# Patient Record
Sex: Male | Born: 1960 | Race: Black or African American | Hispanic: No | Marital: Single | Smoking: Former smoker
Health system: Southern US, Community
[De-identification: ages and names within clinical notes are randomized; demographics above are authoritative.]

## PROBLEM LIST (undated history)

## (undated) DIAGNOSIS — F1010 Alcohol abuse, uncomplicated: Secondary | ICD-10-CM

## (undated) DIAGNOSIS — I4891 Unspecified atrial fibrillation: Secondary | ICD-10-CM

## (undated) DIAGNOSIS — I10 Essential (primary) hypertension: Secondary | ICD-10-CM

## (undated) DIAGNOSIS — R51 Headache: Secondary | ICD-10-CM

## (undated) DIAGNOSIS — F419 Anxiety disorder, unspecified: Secondary | ICD-10-CM

## (undated) DIAGNOSIS — R519 Headache, unspecified: Secondary | ICD-10-CM

## (undated) DIAGNOSIS — F32A Depression, unspecified: Secondary | ICD-10-CM

## (undated) DIAGNOSIS — K219 Gastro-esophageal reflux disease without esophagitis: Secondary | ICD-10-CM

## (undated) DIAGNOSIS — M109 Gout, unspecified: Secondary | ICD-10-CM

## (undated) DIAGNOSIS — F329 Major depressive disorder, single episode, unspecified: Secondary | ICD-10-CM

## (undated) HISTORY — DX: Gout, unspecified: M10.9

## (undated) HISTORY — DX: Essential (primary) hypertension: I10

## (undated) HISTORY — PX: FOOT SURGERY: SHX648

## (undated) HISTORY — DX: Unspecified atrial fibrillation: I48.91

## (undated) HISTORY — PX: COLONOSCOPY: SHX174

## (undated) HISTORY — DX: Depression, unspecified: F32.A

## (undated) HISTORY — DX: Major depressive disorder, single episode, unspecified: F32.9

## (undated) HISTORY — DX: Anxiety disorder, unspecified: F41.9

---

## 2013-06-05 ENCOUNTER — Encounter: Payer: Self-pay | Admitting: *Deleted

## 2013-06-23 ENCOUNTER — Encounter: Payer: Self-pay | Admitting: Family Medicine

## 2013-06-23 ENCOUNTER — Ambulatory Visit (INDEPENDENT_AMBULATORY_CARE_PROVIDER_SITE_OTHER): Payer: BC Managed Care – PPO | Admitting: Family Medicine

## 2013-06-23 VITALS — BP 148/90 | HR 84 | Temp 97.7°F | Resp 16 | Ht 69.5 in | Wt 224.0 lb

## 2013-06-23 DIAGNOSIS — R259 Unspecified abnormal involuntary movements: Secondary | ICD-10-CM

## 2013-06-23 DIAGNOSIS — R251 Tremor, unspecified: Secondary | ICD-10-CM | POA: Insufficient documentation

## 2013-06-23 DIAGNOSIS — F17200 Nicotine dependence, unspecified, uncomplicated: Secondary | ICD-10-CM

## 2013-06-23 DIAGNOSIS — F411 Generalized anxiety disorder: Secondary | ICD-10-CM

## 2013-06-23 DIAGNOSIS — I10 Essential (primary) hypertension: Secondary | ICD-10-CM | POA: Insufficient documentation

## 2013-06-23 DIAGNOSIS — F1020 Alcohol dependence, uncomplicated: Secondary | ICD-10-CM

## 2013-06-23 DIAGNOSIS — K219 Gastro-esophageal reflux disease without esophagitis: Secondary | ICD-10-CM | POA: Insufficient documentation

## 2013-06-23 MED ORDER — OMEPRAZOLE 40 MG PO CPDR: 40.0000 mg | DELAYED_RELEASE_CAPSULE | Freq: Every day | ORAL | Status: DC

## 2013-06-23 MED ORDER — DIAZEPAM 5 MG PO TABS: 5.0000 mg | ORAL_TABLET | Freq: Every evening | ORAL | Status: DC | PRN

## 2013-06-23 MED ORDER — OLMESARTAN MEDOXOMIL-HCTZ 20-12.5 MG PO TABS: 1.0000 | ORAL_TABLET | Freq: Every day | ORAL | Status: DC

## 2013-06-23 MED ORDER — FLUOXETINE HCL 40 MG PO CAPS: 40.0000 mg | ORAL_CAPSULE | Freq: Every day | ORAL | Status: DC

## 2013-06-23 NOTE — Assessment & Plan Note (Signed)
Increase Prozac to 40 mg once a day. I will resume his Valium 5 mg at bedtime

## 2013-06-23 NOTE — Progress Notes (Signed)
Patient ID: Justin Fleming, male   DOB: April 06, 1960, 53 y.o.   MRN: [id number]   Subjective:    Patient ID: Justin FussDouglas L Fleming, male    DOB: April 06, 1960, 53 y.o.   MRN: [id number]  Patient presents for New Patient CPE and Anxiety  patient here to establish care. [facility]CP [facility]. He had not been to a physician for greater than 10 years before he establish care with them this year. He is history of hypertension as well as generalized anxiety and alcohol abuse. He states his anxiety is not very controlled and he does not sleep very well. I reviewed his medications he is a difficult historian. He is currently on fluoxetine 20 mg states it helps some but still does not take the edge off. He was given Ambien CR for insomnia which has not worked very well and looks like he was given and a prescription for Valium 5 mg to take at bedtime this worked much better but it was not refilled and therefore he continued to take the Ambien. He states that he drinks mostly gin every day. On the days he does not work he drinks more than usual but cannot quantify and amount for me today. He states that he's been anxious all his life and has always had a tremor or the shakes as long as he remembers. He does not know his family history very well. He states that he had a colonoscopy that is greater than 10 years ago he's never had a prostate exam that he is aware of . He also has a history of hypertension he was placed on amlodipine/benazepril however started having ankle swelling therefore he stopped this. He then was given a prescription for Lasix 20 mg which he's continued to take without any other antihypertensives.    Review Of Systems:  GEN- denies fatigue, fever, weight loss,weakness, recent illness HEENT- denies eye drainage, change in vision, nasal discharge, CVS- denies chest pain, palpitations RESP- denies SOB, cough, wheeze ABD- denies N/V, change in stools, abd pain GU- denies dysuria,  hematuria, dribbling, incontinence MSK- denies joint pain, muscle aches, injury Neuro- denies headache, dizziness, syncope, seizure activity       Objective:    BP 148/90  Pulse 84  Temp(Src) 97.7 F (36.5 C) (Oral)  Resp 16  Ht 5' 9.5" (1.765 m)  Wt 224 lb (101.606 kg)  BMI 32.62 kg/m2 GEN- NAD, alert and oriented x3 HEENT- PERRL, EOMI, non injected sclera, pink conjunctiva, MMM, oropharynx clear Neck- Supple,  CVS- RRR, no murmur RESP-CTAB EXT- No edema Pulses- Radial, DP- 2+ NEURO- CNII-XII in tact, fine tremor, no flap, Psych- anxious appearing, speech tremors some, not depressed, no SI       Assessment & Plan:      Problem List Items Addressed This Visit   Tremor   GERD (gastroesophageal reflux disease)   Relevant Medications      omeprazole (PRILOSEC) capsule   GAD (generalized anxiety disorder) - Primary   EtOH dependence   Essential hypertension, benign   Relevant Medications      BENICAR HCT 20-12.5 MG PO TABS      Note: This dictation was prepared with Dragon dictation along with smaller phrase technology. Any transcriptional errors that result from this process are unintentional.

## 2013-06-23 NOTE — Assessment & Plan Note (Signed)
We discussed the importance of cutting back on alcohol. I read his previous PCP notes he has declined alcohol counseling and rehabilitation in the past year

## 2013-06-23 NOTE — Patient Instructions (Addendum)
Increase prozac to 40mg , you can take 2 of the 20mg  tablets once a day Valium to be restarted in the evening  New blood pressure pill- benicar HCTZ, take once a day STOP THE LASIX, AMLOPIDINE  We will call with lab results  Try the prilosec for your stomach  F/U 2 weeks for medications

## 2013-06-23 NOTE — Assessment & Plan Note (Signed)
He does have history of acid reflux was taking Zantac but this has not helped as much. He requested something different we'll try him on Prilosec 40 mg

## 2013-06-23 NOTE — Assessment & Plan Note (Signed)
Wary of his tremor is related to both anxiety as well as his alcohol abuse. He has never been in full DTs because he does drink every day

## 2013-06-23 NOTE — Assessment & Plan Note (Signed)
Blood pressure is elevated I will give him samples of Benicar HCTZ 20/12.5 I will check his renal function today. We will discontinue the Lasix

## 2013-06-24 LAB — CBC WITH DIFFERENTIAL/PLATELET
BASOS ABS: 0.1 10*3/uL (ref 0.0–0.1)
BASOS PCT: 1 % (ref 0–1)
EOS PCT: 1 % (ref 0–5)
Eosinophils Absolute: 0.1 10*3/uL (ref 0.0–0.7)
HCT: 45.3 % (ref 39.0–52.0)
HEMOGLOBIN: 16.1 g/dL (ref 13.0–17.0)
LYMPHS PCT: 14 % (ref 12–46)
Lymphs Abs: 1.2 10*3/uL (ref 0.7–4.0)
MCH: 34.6 pg — AB (ref 26.0–34.0)
MCHC: 35.5 g/dL (ref 30.0–36.0)
MCV: 97.4 fL (ref 78.0–100.0)
MONO ABS: 1.1 10*3/uL — AB (ref 0.1–1.0)
MONOS PCT: 12 % (ref 3–12)
Neutro Abs: 6.3 10*3/uL (ref 1.7–7.7)
Neutrophils Relative %: 72 % (ref 43–77)
Platelets: 267 10*3/uL (ref 150–400)
RBC: 4.65 MIL/uL (ref 4.22–5.81)
RDW: 13 % (ref 11.5–15.5)
WBC: 8.8 10*3/uL (ref 4.0–10.5)

## 2013-06-24 LAB — COMPREHENSIVE METABOLIC PANEL
ALT: 25 U/L (ref 0–53)
AST: 30 U/L (ref 0–37)
Albumin: 4.7 g/dL (ref 3.5–5.2)
Alkaline Phosphatase: 75 U/L (ref 39–117)
BUN: 12 mg/dL (ref 6–23)
CHLORIDE: 99 meq/L (ref 96–112)
CO2: 27 mEq/L (ref 19–32)
CREATININE: 0.98 mg/dL (ref 0.50–1.35)
Calcium: 9.7 mg/dL (ref 8.4–10.5)
Glucose, Bld: 86 mg/dL (ref 70–99)
Potassium: 3.6 mEq/L (ref 3.5–5.3)
Sodium: 139 mEq/L (ref 135–145)
Total Bilirubin: 1.1 mg/dL (ref 0.2–1.2)
Total Protein: 8.1 g/dL (ref 6.0–8.3)

## 2013-07-11 ENCOUNTER — Ambulatory Visit (INDEPENDENT_AMBULATORY_CARE_PROVIDER_SITE_OTHER): Payer: BC Managed Care – PPO | Admitting: Family Medicine

## 2013-07-11 ENCOUNTER — Encounter: Payer: Self-pay | Admitting: Family Medicine

## 2013-07-11 VITALS — BP 126/82 | HR 72 | Temp 98.5°F | Resp 16 | Ht 70.5 in | Wt 218.0 lb

## 2013-07-11 DIAGNOSIS — G4700 Insomnia, unspecified: Secondary | ICD-10-CM

## 2013-07-11 DIAGNOSIS — Z202 Contact with and (suspected) exposure to infections with a predominantly sexual mode of transmission: Secondary | ICD-10-CM

## 2013-07-11 DIAGNOSIS — I10 Essential (primary) hypertension: Secondary | ICD-10-CM

## 2013-07-11 DIAGNOSIS — F411 Generalized anxiety disorder: Secondary | ICD-10-CM

## 2013-07-11 MED ORDER — OLMESARTAN MEDOXOMIL-HCTZ 20-12.5 MG PO TABS: 1.0000 | ORAL_TABLET | Freq: Every day | ORAL | Status: DC

## 2013-07-11 NOTE — Patient Instructions (Signed)
Continue current medications  Pick up the Valium and your blood pressure pills  F/U 3 months

## 2013-07-12 DIAGNOSIS — G4700 Insomnia, unspecified: Secondary | ICD-10-CM | POA: Insufficient documentation

## 2013-07-12 LAB — HSV(HERPES SMPLX)ABS-I+II(IGG+IGM)-BLD
HSV 2 Glycoprotein G Ab, IgG: <0.1 IV
Herpes Simplex Vrs I&II-IgM Ab (EIA): 0.78 INDEX

## 2013-07-12 NOTE — Progress Notes (Signed)
Patient ID: Justin FussDouglas L Rockefeller, male   DOB: [birth year], 53 y.o.   MRN: [id number]   Subjective:    Patient ID: Justin FussDouglas L Roat, male    DOB: [birth year], 53 y.o.   MRN: [id number]  Patient presents for 2 week F/U- HTN  patient here for interim followup. He states that the Benicar to well for him he did not have any side effects. His shaking has improved but he states that he did drink more last night therefore he states less when he drinks more than usual. Regarding his sleep he never picked up the diazepam there was some confusion at the pharmacy. He is also doing well on the Prilosec. He has no new concerns today. His labs were reviewed with him. 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 He is also doing much better with the Prozac at 40 mg, he feels like his nerves are better    Review Of Systems:  GEN- denies fatigue, fever, weight loss,weakness, recent illness HEENT- denies eye drainage, change in vision, nasal discharge, CVS- denies chest pain, palpitations RESP- denies SOB, cough, wheeze ABD- denies N/V, change in stools, abd pain GU- denies dysuria, hematuria, dribbling, incontinence MSK- denies joint pain, muscle aches, injury Neuro- denies headache, dizziness, syncope, seizure activity       Objective:    BP 126/82  Pulse 72  Temp(Src) 98.5 F (36.9 C) (Oral)  Resp 16  Ht 5' 10.5" (1.791 m)  Wt 218 lb (98.884 kg)  BMI 30.83 kg/m2 GEN- NAD, alert and oriented x3 HEENT- PERRL, EOMI, non injected sclera, pink conjunctiva, MMM, oropharynx clear CVS- RRR, no murmur RESP-CTAB Psych- normal affect and mood, not overly anxious, note no tremor noted today  Pulses- Radial 2+        Assessment & Plan:      Problem List Items Addressed This Visit   None    Visit Diagnoses   STD exposure    -  Primary    Relevant Orders       HSV(herpes smplx)abs-1+2(IgG+IgM)-bld (Completed)        Note: This dictation was prepared with Dragon dictation along with smaller phrase technology. Any transcriptional errors that result from this process are unintentional.

## 2013-07-12 NOTE — Assessment & Plan Note (Signed)
We will continue Benicar HCTZ 20/12.5, his blood pressure is improved some today, unsure when he decrease his alcohol and his blood pressure goes up is well-appearing

## 2013-07-12 NOTE — Assessment & Plan Note (Signed)
I will continue the Prozac at 40 mg he will use the Valium 5 mg for his sleep

## 2013-07-14 ENCOUNTER — Telehealth: Payer: Self-pay | Admitting: *Deleted

## 2013-07-14 NOTE — Telephone Encounter (Signed)
Received PA request for Benicar/ HCTZ.   PA submitted.

## 2013-07-17 NOTE — Telephone Encounter (Signed)
Received PA determination.   PA denied.  

## 2013-07-28 ENCOUNTER — Telehealth: Payer: Self-pay | Admitting: Family Medicine

## 2013-07-28 MED ORDER — LOSARTAN POTASSIUM 100 MG PO TABS: 100.0000 mg | ORAL_TABLET | Freq: Every day | ORAL | Status: DC

## 2013-07-28 MED ORDER — HYDROCHLOROTHIAZIDE 12.5 MG PO TABS: 12.5000 mg | ORAL_TABLET | Freq: Every day | ORAL | Status: DC

## 2013-07-28 NOTE — Telephone Encounter (Signed)
Patient would like to know if his insurance has approved his benicar please call him back at [phone]

## 2013-07-28 NOTE — Telephone Encounter (Signed)
Patient came by office and was made aware.

## 2013-07-28 NOTE — Telephone Encounter (Signed)
PA has been denied.   MD please advise.

## 2013-07-28 NOTE — Telephone Encounter (Signed)
Prescription sent to pharmacy.   Call placed to patient. LMTRC.  

## 2013-07-28 NOTE — Telephone Encounter (Signed)
Send Losartan 100mg  once a day,  HCTZ 12.5mg  once a day --  instead of Benciar HCT, insurance will cover these, tell him it is a similar medication

## 2013-11-09 ENCOUNTER — Telehealth: Payer: Self-pay | Admitting: Family Medicine

## 2013-11-09 NOTE — Telephone Encounter (Signed)
Patient requesting refill on diazepam Please call him at [phone]

## 2013-11-09 NOTE — Telephone Encounter (Signed)
Ok to refill??  Last office visit 07/11/2013.  Last refill 06/23/2013, #2 refills.

## 2013-11-10 MED ORDER — DIAZEPAM 5 MG PO TABS: 5.0000 mg | ORAL_TABLET | Freq: Every evening | ORAL | Status: DC | PRN

## 2013-11-10 NOTE — Telephone Encounter (Signed)
Medication called to pharmacy.  Call placed to patient. LMTRC.  

## 2013-11-10 NOTE — Telephone Encounter (Signed)
Give 1 refill, pt due for OV 

## 2013-11-13 ENCOUNTER — Other Ambulatory Visit: Payer: Self-pay | Admitting: Family Medicine

## 2013-11-13 NOTE — Telephone Encounter (Signed)
Patient returned call and made aware.   Appointment scheduled.   

## 2013-11-22 ENCOUNTER — Ambulatory Visit: Payer: Self-pay | Admitting: Family Medicine

## 2013-11-24 ENCOUNTER — Ambulatory Visit (INDEPENDENT_AMBULATORY_CARE_PROVIDER_SITE_OTHER): Payer: BC Managed Care – PPO | Admitting: Family Medicine

## 2013-11-24 ENCOUNTER — Encounter: Payer: Self-pay | Admitting: Family Medicine

## 2013-11-24 VITALS — BP 134/76 | HR 68 | Temp 98.7°F | Resp 14 | Ht 71.0 in | Wt 217.0 lb

## 2013-11-24 DIAGNOSIS — Z23 Encounter for immunization: Secondary | ICD-10-CM

## 2013-11-24 DIAGNOSIS — G4700 Insomnia, unspecified: Secondary | ICD-10-CM

## 2013-11-24 DIAGNOSIS — F411 Generalized anxiety disorder: Secondary | ICD-10-CM

## 2013-11-24 DIAGNOSIS — I10 Essential (primary) hypertension: Secondary | ICD-10-CM

## 2013-11-24 DIAGNOSIS — F1020 Alcohol dependence, uncomplicated: Secondary | ICD-10-CM

## 2013-11-24 MED ORDER — OMEPRAZOLE 40 MG PO CPDR: 40.0000 mg | DELAYED_RELEASE_CAPSULE | Freq: Every day | ORAL | Status: DC

## 2013-11-24 MED ORDER — FLUOXETINE HCL 40 MG PO CAPS: 40.0000 mg | ORAL_CAPSULE | Freq: Every day | ORAL | Status: DC

## 2013-11-24 MED ORDER — OLMESARTAN MEDOXOMIL-HCTZ 20-12.5 MG PO TABS: 1.0000 | ORAL_TABLET | Freq: Every day | ORAL | Status: DC

## 2013-11-24 MED ORDER — DIAZEPAM 5 MG PO TABS: 5.0000 mg | ORAL_TABLET | Freq: Every evening | ORAL | Status: DC | PRN

## 2013-11-24 NOTE — Progress Notes (Signed)
Patient ID: Justin Fleming, male   DOB: [birth year], 53 y.o.   MRN: [id number]   Subjective:    Patient ID: Justin Fleming, male    DOB: [birth year], 53 y.o.   MRN: [id number]  Patient presents for F/U patient here to follow-up chronic medical problems. He has no specific concerns today. He trying to decrease his alcohol intake drinks gin . He has not been taking his Prozac on a daily basis and he asked was on 40 mg and was doing better with this but his bottle today which is from September is 20 mg.He is taking valium once a day which helps his tremor    Review Of Systems:  GEN- denies fatigue, fever, weight loss,weakness, recent illness HEENT- denies eye drainage, change in vision, nasal discharge, CVS- denies chest pain, palpitations RESP- denies SOB, cough, wheeze ABD- denies N/V, change in stools, abd pain GU- denies dysuria, hematuria, dribbling, incontinence MSK- denies joint pain, muscle aches, injury Neuro- denies headache, dizziness, syncope, seizure activity       Objective:    BP 134/76 mmHg  Pulse 68  Temp(Src) 98.7 F (37.1 C) (Oral)  Resp 14  Ht 5\' 11"  (1.803 m)  Wt 217 lb (98.431 kg)  BMI 30.28 kg/m2 GEN- NAD, alert and oriented x3 HEENT- PERRL, EOMI, non injected sclera, pink conjunctiva, MMM, oropharynx clear Neck- Supple,  CVS- RRR, no murmur RESP-CTAB ABD-NABS,soft, liver edge not palpated, NT EXT- No edema Psych- flat affect, not anxious, no SI, well groomed Pulses- Radial 2+        Assessment & Plan:      Problem List Items Addressed This Visit    None    Visit Diagnoses    Need for prophylactic vaccination with combined diphtheria-tetanus-pertussis (DTP) vaccine    -  Primary    Relevant Orders       Tdap vaccine greater than or equal to 7yo IM (Completed)       Note: This dictation was prepared with Dragon dictation along with smaller phrase technology. Any transcriptional errors that result from this process are unintentional.

## 2013-11-24 NOTE — Patient Instructions (Signed)
F/U 4 months for PHYSICAL Tetanus Shot given

## 2013-11-24 NOTE — Assessment & Plan Note (Signed)
Continue to work on cessation

## 2013-11-24 NOTE — Assessment & Plan Note (Signed)
prozac increased back to 40mg , contiue valium Declined flu shot, TDAP given

## 2013-11-24 NOTE — Assessment & Plan Note (Signed)
Well controlled 

## 2013-12-21 ENCOUNTER — Telehealth: Payer: Self-pay | Admitting: Family Medicine

## 2013-12-21 NOTE — Telephone Encounter (Signed)
Patient went to his pharmacy to get refills on ALL medications and they told him to call us for this  Please call him with any questions    [phone] [facility] [address] [facility]

## 2013-12-21 NOTE — Telephone Encounter (Signed)
Pt has refills already placed, does not need us to refill at this time

## 2013-12-26 ENCOUNTER — Telehealth: Payer: Self-pay | Admitting: Family Medicine

## 2013-12-26 NOTE — Telephone Encounter (Signed)
Call placed to [facility].   Was advised that refills were called in on 11/24/2013 and are available.   Advised that Benicar was just filled on 12/09/2013, so it is too early to refill at this time.   Call placed to patient and patient made aware.

## 2013-12-26 NOTE — Telephone Encounter (Signed)
Patient is calling to say that he called his pharmacy and they told him to call us, to get refills on his prozac, benicar, and diazepam if possible  [phone] if any questions

## 2014-01-31 ENCOUNTER — Other Ambulatory Visit: Payer: Self-pay | Admitting: Family Medicine

## 2014-01-31 NOTE — Telephone Encounter (Signed)
He is on Valium 5mg , not 10, okay to refill 5mg 

## 2014-01-31 NOTE — Telephone Encounter (Signed)
Patient seen in office on 11/24/2013. Refill for Diazepam 5mg  called in with #2 refills.   No order for Diazepam 10mg  noted.   MD please advise.

## 2014-02-01 MED ORDER — DIAZEPAM 5 MG PO TABS: 5.0000 mg | ORAL_TABLET | Freq: Every evening | ORAL | Status: DC | PRN

## 2014-02-01 NOTE — Telephone Encounter (Signed)
Finally able to reach pharmacy and left refill message

## 2014-02-01 NOTE — Telephone Encounter (Signed)
10 mg refill request for Diazepam denied.  Trying to call in 5 mg dose.  Line busy

## 2014-03-28 ENCOUNTER — Other Ambulatory Visit: Payer: BLUE CROSS/BLUE SHIELD

## 2014-03-28 DIAGNOSIS — Z79899 Other long term (current) drug therapy: Secondary | ICD-10-CM

## 2014-03-28 DIAGNOSIS — K219 Gastro-esophageal reflux disease without esophagitis: Secondary | ICD-10-CM

## 2014-03-28 DIAGNOSIS — Z720 Tobacco use: Secondary | ICD-10-CM

## 2014-03-28 DIAGNOSIS — F411 Generalized anxiety disorder: Secondary | ICD-10-CM

## 2014-03-28 DIAGNOSIS — Z0000 Encounter for general adult medical examination without abnormal findings: Secondary | ICD-10-CM

## 2014-03-28 DIAGNOSIS — I10 Essential (primary) hypertension: Secondary | ICD-10-CM

## 2014-03-28 DIAGNOSIS — Z125 Encounter for screening for malignant neoplasm of prostate: Secondary | ICD-10-CM

## 2014-03-28 DIAGNOSIS — R251 Tremor, unspecified: Secondary | ICD-10-CM

## 2014-03-28 DIAGNOSIS — F1029 Alcohol dependence with unspecified alcohol-induced disorder: Secondary | ICD-10-CM

## 2014-03-28 LAB — COMPLETE METABOLIC PANEL WITH GFR
ALK PHOS: 86 U/L (ref 39–117)
ALT: 24 U/L (ref 0–53)
AST: 40 U/L — AB (ref 0–37)
Albumin: 4 g/dL (ref 3.5–5.2)
BILIRUBIN TOTAL: 2.4 mg/dL — AB (ref 0.2–1.2)
BUN: 16 mg/dL (ref 6–23)
CO2: 24 meq/L (ref 19–32)
CREATININE: 1.17 mg/dL (ref 0.50–1.35)
Calcium: 9.2 mg/dL (ref 8.4–10.5)
Chloride: 98 mEq/L (ref 96–112)
GFR, EST AFRICAN AMERICAN: 80 mL/min
GFR, Est Non African American: 69 mL/min
Glucose, Bld: 100 mg/dL — ABNORMAL HIGH (ref 70–99)
POTASSIUM: 3.8 meq/L (ref 3.5–5.3)
Sodium: 136 mEq/L (ref 135–145)
Total Protein: 7.4 g/dL (ref 6.0–8.3)

## 2014-03-28 LAB — CBC WITH DIFFERENTIAL/PLATELET
BASOS ABS: 0.1 10*3/uL (ref 0.0–0.1)
Basophils Relative: 1 % (ref 0–1)
Eosinophils Absolute: 0.1 10*3/uL (ref 0.0–0.7)
Eosinophils Relative: 2 % (ref 0–5)
HCT: 46.3 % (ref 39.0–52.0)
Hemoglobin: 15.9 g/dL (ref 13.0–17.0)
LYMPHS ABS: 1 10*3/uL (ref 0.7–4.0)
LYMPHS PCT: 19 % (ref 12–46)
MCH: 37.6 pg — ABNORMAL HIGH (ref 26.0–34.0)
MCHC: 34.3 g/dL (ref 30.0–36.0)
MCV: 109.5 fL — ABNORMAL HIGH (ref 78.0–100.0)
MPV: 10.3 fL (ref 8.6–12.4)
Monocytes Absolute: 0.7 10*3/uL (ref 0.1–1.0)
Monocytes Relative: 14 % — ABNORMAL HIGH (ref 3–12)
NEUTROS PCT: 64 % (ref 43–77)
Neutro Abs: 3.3 10*3/uL (ref 1.7–7.7)
PLATELETS: 185 10*3/uL (ref 150–400)
RBC: 4.23 MIL/uL (ref 4.22–5.81)
RDW: 15.1 % (ref 11.5–15.5)
WBC: 5.1 10*3/uL (ref 4.0–10.5)

## 2014-03-28 LAB — LIPID PANEL
Cholesterol: 177 mg/dL (ref 0–200)
HDL: 79 mg/dL (ref >=40)
LDL Cholesterol: 82 mg/dL (ref 0–99)
Total CHOL/HDL Ratio: 2.2 Ratio
Triglycerides: 78 mg/dL (ref <150)
VLDL: 16 mg/dL (ref 0–40)

## 2014-03-28 LAB — TSH: TSH: 1.364 u[IU]/mL (ref 0.350–4.500)

## 2014-03-29 LAB — PSA: PSA: 0.98 ng/mL (ref <=4.00)

## 2014-04-02 ENCOUNTER — Ambulatory Visit (INDEPENDENT_AMBULATORY_CARE_PROVIDER_SITE_OTHER): Payer: BLUE CROSS/BLUE SHIELD | Admitting: Family Medicine

## 2014-04-02 ENCOUNTER — Encounter: Payer: Self-pay | Admitting: Family Medicine

## 2014-04-02 VITALS — BP 148/86 | HR 76 | Temp 98.8°F | Resp 14 | Ht 71.0 in | Wt 204.0 lb

## 2014-04-02 DIAGNOSIS — F411 Generalized anxiety disorder: Secondary | ICD-10-CM

## 2014-04-02 DIAGNOSIS — W57XXXA Bitten or stung by nonvenomous insect and other nonvenomous arthropods, initial encounter: Secondary | ICD-10-CM

## 2014-04-02 DIAGNOSIS — Z0000 Encounter for general adult medical examination without abnormal findings: Secondary | ICD-10-CM | POA: Diagnosis not present

## 2014-04-02 DIAGNOSIS — I10 Essential (primary) hypertension: Secondary | ICD-10-CM

## 2014-04-02 DIAGNOSIS — Z1211 Encounter for screening for malignant neoplasm of colon: Secondary | ICD-10-CM | POA: Diagnosis not present

## 2014-04-02 DIAGNOSIS — F1020 Alcohol dependence, uncomplicated: Secondary | ICD-10-CM | POA: Diagnosis not present

## 2014-04-02 DIAGNOSIS — G4700 Insomnia, unspecified: Secondary | ICD-10-CM

## 2014-04-02 DIAGNOSIS — T148 Other injury of unspecified body region: Secondary | ICD-10-CM | POA: Diagnosis not present

## 2014-04-02 MED ORDER — DIAZEPAM 5 MG PO TABS: 5.0000 mg | ORAL_TABLET | Freq: Two times a day (BID) | ORAL | Status: DC | PRN

## 2014-04-02 NOTE — Assessment & Plan Note (Addendum)
single AST mildly elevated, also has increased MCV, discussed cutting back to taper off his ETOH I am concerned he will go  Into DT if not monitored and he does not want to go into Detox program He is self treating his anxiety with ETOH, his tremor worsens as the day goes on as ETOH wears off as wel Will try him on 2.5mg  Valium in AM and continue 5mg  in PM Start thiamine and Folic acid Discussed he can go to ED for detox at any time

## 2014-04-02 NOTE — Assessment & Plan Note (Signed)
Declines use of SSRI, see above for valium, which may help with is tremor

## 2014-04-02 NOTE — Assessment & Plan Note (Signed)
BP elevated today, but also anxious, will have him check at home and see what readings are before changing meds

## 2014-04-02 NOTE — Patient Instructions (Addendum)
Referral for Colon cancer screening Take the valium take 1/2 tablet in the morning and 1 tablet in the evening Continue blood pressure medication Folic acid 1mg  once a day  Thiamine  50mg  once a day  Check your blood pressure at home, we will call in 1 week with readings Labs overall look okay  Repeat liver labs in 12 weeks F/U  3 months

## 2014-04-02 NOTE — Progress Notes (Signed)
Patient ID: Justin Fleming, male   DOB: [birth year], 54 y.o.   MRN: [id number]   Subjective:    Patient ID: Justin Fleming, male    DOB: June 19, 1960, 54 y.o.   MRN: [id number]  Patient presents for CPE  patient here for complete physical exam. His medications were reviewed. He has not been taking his Prozac because he states that it was not helping it was worsening his anxiety. He has been taking the Valium which is the only thing that calms him down and does allow him to sleep about 5 or 6 hours. He continues to self medicate with alcohol he is now drinking whiskey he states that he drinks at least a half a pint every night he is fine during the day but when it starts to wear off his tremor starts to get worse at the end of his work shift. He denies any hallucinations but states that this does make his anxiety increased when his tremor gets worse.  He pulled 3 ticks off of him 3 weeks ago he did not have any drainage from the lesions but he continues to pick where they were and they have turned into scabs.  Hypertension he states he is taking his blood pressure is prescribed he's only missed a couple of doses of the medication. No side effects from the medication.  Fasting labs were reviewed his PSA was normal he is due for colon cancer screening.    Review Of Systems:  GEN- denies fatigue, fever, weight loss,weakness, recent illness HEENT- denies eye drainage, change in vision, nasal discharge, CVS- denies chest pain, palpitations RESP- denies SOB, cough, wheeze ABD- denies N/V, change in stools, abd pain GU- denies dysuria, hematuria, dribbling, incontinence MSK- denies joint pain, muscle aches, injury Neuro- denies headache, dizziness, syncope, seizure activity       Objective:    BP 148/86 mmHg  Pulse 76  Temp(Src) 98.8 F (37.1 C) (Oral)  Resp 14  Ht  (1.803 m)  Wt 204 lb (92.534 kg)  BMI 28.46 kg/m2 GEN- NAD, alert and oriented x3 HEENT- PERRL, EOMI, non injected  sclera, pink conjunctiva, MMM, oropharynx clear Neck- Supple, no thyromegaly CVS- RRR, no murmur RESP-CTAB ABD-NABS,soft,NT,ND Psych- anxious appearing, not depressed, no SI, well groomed Neuro- CNII-XII in tact, resting tremor, stumbling speech at times Rectum- normal tone, external skin tag, FOBT neg, prostate smooth, no nodules Skin- right buttocks- scab, no erythema, hyperpigmentation noted, right groin- small open scab, hyperpigmented circular, no induration, NT, small scab left lower abdomen EXT- No edema Pulses- Radial, DP- 2+        Assessment & Plan:      Problem List Items Addressed This Visit      Unprioritized   Insomnia   GAD (generalized anxiety disorder)    Declines use of SSRI, see above for valium, which may help with is tremor      EtOH dependence - Primary    single AST mildly elevated, also has increased MCV, discussed cutting back to taper off his ETOH I am concerned he will go  Into DT if not monitored and he does not want to go into Detox program He is self treating his anxiety with ETOH, his tremor worsens as the day goes on as ETOH wears off as wel Will try him on 2.5mg  Valium in AM and continue  in PM Start thiamine and Folic acid      Essential hypertension, benign    BP elevated today, but also  anxious, will have him check at home and see what readings are before changing meds       Other Visit Diagnoses    Routine general medical examination at a health care facility        CPE done, immunizations UTD, fasting labs reviewed, PSA normal, refer for colonoscopy.     Colon cancer screening        Relevant Orders    Ambulatory referral to Gastroenterology    Tick bite        Few tick bites, scabs noted with pigmented scarring, use topical antibiotic, advised to stop picking at lesions       Note: This dictation was prepared with Dragon dictation along with smaller phrase technology. Any transcriptional errors that result from this process  are unintentional.

## 2014-04-30 ENCOUNTER — Telehealth: Payer: Self-pay

## 2014-04-30 NOTE — Telephone Encounter (Signed)
Pt called this afternoon for DS. He had received a letter to call. I told him she was not here today and hopefully will be back tomorrow. Please call [phone]

## 2014-05-01 NOTE — Telephone Encounter (Signed)
I called pt. He said he had a colonoscopy about 25 years ago by someone in [place]. He said [place] of [place]. He denies it being done at the hospital, but he does not know the physician's name. He said he did not have any polyps and no problems.   He has some concerns with his stomach, a lot of gas and a lot of noises. He has had chest pain previously and was told it is not cardiac.York SpanielSaid it was reflux.  He started on Omeprazole 40 mg daily but he just takes as he feels that he needs it now.   OV with Gerrit HallsAnna Sams, NP on 05/23/2014 at 1:30 PM.

## 2014-05-02 NOTE — Telephone Encounter (Signed)
LMOM for pt to call with his insurance information.

## 2014-05-04 NOTE — Telephone Encounter (Signed)
Pt has [employer] Subscriber ID  [id number].

## 2014-05-07 ENCOUNTER — Other Ambulatory Visit: Payer: Self-pay | Admitting: Family Medicine

## 2014-05-08 NOTE — Telephone Encounter (Signed)
Refill denied.   Patient reported at CPE that he is not taking Prozac.   Pharmacy made aware that if patient is requesting refill, he requires OV to discuss use.

## 2014-05-23 ENCOUNTER — Telehealth: Payer: Self-pay | Admitting: General Practice

## 2014-05-23 ENCOUNTER — Encounter: Payer: Self-pay | Admitting: Gastroenterology

## 2014-05-23 ENCOUNTER — Ambulatory Visit (INDEPENDENT_AMBULATORY_CARE_PROVIDER_SITE_OTHER): Payer: BLUE CROSS/BLUE SHIELD | Admitting: Gastroenterology

## 2014-05-23 ENCOUNTER — Other Ambulatory Visit: Payer: Self-pay

## 2014-05-23 VITALS — BP 139/95 | HR 109 | Temp 98.0°F | Ht 71.0 in | Wt 205.4 lb

## 2014-05-23 DIAGNOSIS — R160 Hepatomegaly, not elsewhere classified: Secondary | ICD-10-CM | POA: Insufficient documentation

## 2014-05-23 DIAGNOSIS — Z1211 Encounter for screening for malignant neoplasm of colon: Secondary | ICD-10-CM | POA: Diagnosis not present

## 2014-05-23 MED ORDER — PEG 3350-KCL-NA BICARB-NACL 420 G PO SOLR: 4000.0000 mL | Freq: Once | ORAL | Status: DC

## 2014-05-23 NOTE — Telephone Encounter (Signed)
OPENED IN ERROR
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
## 2014-05-23 NOTE — Progress Notes (Signed)
cc'ed to pcp °

## 2014-05-23 NOTE — Assessment & Plan Note (Signed)
Mild hepatomegaly on exam, liver margin smooth. Slight elevation in AST consistent with ETOH use. Tbili 2.4, non-specific. Proceed with US abdomen. Discussed ETOH cessation.

## 2014-05-23 NOTE — Patient Instructions (Addendum)
I have ordered an ultrasound to further evaluate your liver.   You have been scheduled for a routine screening colonoscopy with Dr. Darrick PennaFields.   I have also attached a gas/bloating handout.   Bloating Bloating is the feeling of fullness in your belly. You may feel as though your pants are too tight. Often the cause of bloating is overeating, retaining fluids, or having gas in your bowel. It is also caused by swallowing air and eating foods that cause gas. Irritable bowel syndrome is one of the most common causes of bloating. Constipation is also a common cause. Sometimes more serious problems can cause bloating. SYMPTOMS  Usually there is a feeling of fullness, as though your abdomen is bulged out. There may be mild discomfort.  DIAGNOSIS  Usually no particular testing is necessary for most bloating. If the condition persists and seems to become worse, your caregiver may do additional testing.  TREATMENT   There is no direct treatment for bloating.  Do not put gas into the bowel. Avoid chewing gum and sucking on candy. These tend to make you swallow air. Swallowing air can also be a nervous habit. Try to avoid this.  Avoiding high residue diets will help. Eat foods with soluble fibers (examples include root vegetables, apples, or barley) and substitute dairy products with soy and rice products. This helps irritable bowel syndrome.  If constipation is the cause, then a high residue diet with more fiber will help.  Avoid carbonated beverages.  Over-the-counter preparations are available that help reduce gas. Your pharmacist can help you with this. SEEK MEDICAL CARE IF:   Bloating continues and seems to be getting worse.  You notice a weight gain.  You have a weight loss but the bloating is getting worse.  You have changes in your bowel habits or develop nausea or vomiting. SEEK IMMEDIATE MEDICAL CARE IF:   You develop shortness of breath or swelling in your legs.  You have an  increase in abdominal pain or develop chest pain. Document Released: 10/22/2005 Document Revised: 03/16/2011 Document Reviewed: 12/10/2006 [facility] Patient Information 2015 [place], [place]. This information is not intended to replace advice given to you by your health care provider. Make sure you discuss any questions you have with your health care provider.

## 2014-05-23 NOTE — Assessment & Plan Note (Signed)
54 year old male due for screening colonoscopy without any concerning lower GI symptoms. Last colonoscopy in remote past, unknown provider.   Proceed with TCS with Dr. Darrick PennaFields in near future: the risks, benefits, and alternatives have been discussed with the patient in detail. The patient states understanding and desires to proceed. Phenergan 25 mg IV on call due to ETOH use

## 2014-05-28 ENCOUNTER — Ambulatory Visit (HOSPITAL_COMMUNITY)
Admission: RE | Admit: 2014-05-28 | Discharge: 2014-05-28 | Disposition: A | Discharge status: Home / Self Care | Payer: BLUE CROSS/BLUE SHIELD | Source: Ambulatory Visit | Attending: Gastroenterology | Admitting: Gastroenterology

## 2014-05-28 DIAGNOSIS — R160 Hepatomegaly, not elsewhere classified: Secondary | ICD-10-CM | POA: Diagnosis present

## 2014-05-29 NOTE — Progress Notes (Signed)
Quick Note:  Fatty liver noted. Mildly enlarged. If willing, an elastography would help shed light on any fibrosis/cirrhosis. Let's put a possible EGD on at time of colonoscopy IN CASE of high Metavir score. Please let patient know this and see if he is willing. ______

## 2014-05-30 NOTE — Progress Notes (Signed)
Quick Note:  I called and informed pt. He said he needs a little time to think about it and he will let us know. I urged him to call within the next day or so to get the EGD added if he decides to do it and he said he would. ______

## 2014-06-06 NOTE — Progress Notes (Signed)
Quick Note:  Appt is for June 13th. Has he decided? ______

## 2014-06-06 NOTE — Progress Notes (Signed)
Quick Note:  Pt is scheduled for the colonoscopy on 06/15/2014. He said he is still not sure if he wants to have the EGD. He said [place] is left with [place].00 from the [place]. He is gong to call his insurance co and ask them. I gave him the procedure codes for the TCS ( 16109( 45378) and EGD ( 60454( 43235).  He said he will try to call back this afternoon or tomorrow and let us know. ______

## 2014-06-08 ENCOUNTER — Telehealth: Payer: Self-pay

## 2014-06-08 NOTE — Telephone Encounter (Signed)
Pt had left a VM that he had some questions about his procedures. I returned his call and LMOM for a return call.

## 2014-06-08 NOTE — Telephone Encounter (Signed)
Noted  

## 2014-06-08 NOTE — Progress Notes (Signed)
Quick Note:  See separate phone note also. Pt said his insurance will not cover the EGD so therefore he only wants the colonoscopy for now. ______

## 2014-06-08 NOTE — Telephone Encounter (Signed)
PT called and said he decided not to have the EGD done now, just proceed with the colonoscopy as scheduled.  He said he checked with his insurance and they will cover the colonoscopy but not the EGD.  He is only scheduled for the TCS.

## 2014-06-15 ENCOUNTER — Encounter (HOSPITAL_COMMUNITY)
Admission: RE | Disposition: A | Discharge status: Home / Self Care | Payer: Self-pay | Source: Ambulatory Visit | Attending: Gastroenterology

## 2014-06-15 ENCOUNTER — Encounter (HOSPITAL_COMMUNITY): Payer: Self-pay | Admitting: *Deleted

## 2014-06-15 ENCOUNTER — Ambulatory Visit (HOSPITAL_COMMUNITY)
Admission: RE | Admit: 2014-06-15 | Discharge: 2014-06-15 | Disposition: A | Discharge status: Home / Self Care | Payer: BLUE CROSS/BLUE SHIELD | Source: Ambulatory Visit | Attending: Gastroenterology | Admitting: Gastroenterology

## 2014-06-15 DIAGNOSIS — I10 Essential (primary) hypertension: Secondary | ICD-10-CM | POA: Insufficient documentation

## 2014-06-15 DIAGNOSIS — Z79899 Other long term (current) drug therapy: Secondary | ICD-10-CM | POA: Insufficient documentation

## 2014-06-15 DIAGNOSIS — K5730 Diverticulosis of large intestine without perforation or abscess without bleeding: Secondary | ICD-10-CM | POA: Insufficient documentation

## 2014-06-15 DIAGNOSIS — K621 Rectal polyp: Secondary | ICD-10-CM | POA: Insufficient documentation

## 2014-06-15 DIAGNOSIS — Z7982 Long term (current) use of aspirin: Secondary | ICD-10-CM | POA: Insufficient documentation

## 2014-06-15 DIAGNOSIS — Z87891 Personal history of nicotine dependence: Secondary | ICD-10-CM | POA: Diagnosis not present

## 2014-06-15 DIAGNOSIS — M6289 Other specified disorders of muscle: Secondary | ICD-10-CM | POA: Insufficient documentation

## 2014-06-15 DIAGNOSIS — F419 Anxiety disorder, unspecified: Secondary | ICD-10-CM | POA: Diagnosis not present

## 2014-06-15 DIAGNOSIS — K219 Gastro-esophageal reflux disease without esophagitis: Secondary | ICD-10-CM | POA: Diagnosis not present

## 2014-06-15 DIAGNOSIS — K648 Other hemorrhoids: Secondary | ICD-10-CM | POA: Insufficient documentation

## 2014-06-15 DIAGNOSIS — K651 Peritoneal abscess: Secondary | ICD-10-CM | POA: Diagnosis not present

## 2014-06-15 DIAGNOSIS — Z1211 Encounter for screening for malignant neoplasm of colon: Secondary | ICD-10-CM | POA: Diagnosis not present

## 2014-06-15 HISTORY — PX: COLONOSCOPY: SHX5424

## 2014-06-15 HISTORY — DX: Gastro-esophageal reflux disease without esophagitis: K21.9

## 2014-06-15 HISTORY — DX: Headache, unspecified: R51.9

## 2014-06-15 HISTORY — DX: Headache: R51

## 2014-06-15 SURGERY — COLONOSCOPY
Anesthesia: Moderate Sedation

## 2014-06-15 MED ORDER — STERILE WATER FOR IRRIGATION IR SOLN
Status: DC | PRN
  Administered 2014-06-15: 15:00:00

## 2014-06-15 MED ORDER — MEPERIDINE HCL 100 MG/ML IJ SOLN
INTRAMUSCULAR | Status: DC | PRN
  Administered 2014-06-15 (×2): 25 mg
  Administered 2014-06-15: 50 mg
  Administered 2014-06-15 (×2): 25 mg

## 2014-06-15 MED ORDER — SODIUM CHLORIDE 0.9 % IJ SOLN
INTRAMUSCULAR | Status: AC
  Filled 2014-06-15: qty 3

## 2014-06-15 MED ORDER — SODIUM CHLORIDE 0.9 % IV SOLN
INTRAVENOUS | Status: DC
  Administered 2014-06-15: 1000 mL via INTRAVENOUS

## 2014-06-15 MED ORDER — MIDAZOLAM HCL 5 MG/5ML IJ SOLN
INTRAMUSCULAR | Status: DC | PRN
  Administered 2014-06-15 (×4): 2 mg via INTRAVENOUS

## 2014-06-15 MED ORDER — PROMETHAZINE HCL 25 MG/ML IJ SOLN
25.0000 mg | Freq: Once | INTRAMUSCULAR | Status: AC
Start: 2014-06-15 — End: 2014-06-15
  Administered 2014-06-15: 25 mg via INTRAVENOUS
  Filled 2014-06-15: qty 1

## 2014-06-15 MED ORDER — MEPERIDINE HCL 100 MG/ML IJ SOLN
INTRAMUSCULAR | Status: DC
Start: 2014-06-15 — End: 2014-06-15
  Filled 2014-06-15: qty 2

## 2014-06-15 MED ORDER — MIDAZOLAM HCL 5 MG/5ML IJ SOLN
INTRAMUSCULAR | Status: AC
  Filled 2014-06-15: qty 10
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
## 2014-06-15 NOTE — Op Note (Signed)
[facility] [address] [place], [address]   COLONOSCOPY PROCEDURE REPORT  PATIENT: Justin Fleming, Justin Fleming  MR#: [id number] BIRTHDATE: [birth year] , 54  yrs. old GENDER: male ENDOSCOPIST: West Bali, MD REFERRED OI:NOMVEHM Chase, M.D. PROCEDURE DATE:  07-09-2014 PROCEDURE:   Colonoscopy with cold biopsy polypectomy INDICATIONS:average risk patient for colon cancer. MEDICATIONS: Promethazine (Phenergan) 25 mg IV, Demerol 150 mg IV, and Versed 8 mg IV  DESCRIPTION OF PROCEDURE:    Physical exam was performed.  Informed consent was obtained from the patient after explaining the benefits, risks, and alternatives to procedure.  The patient was connected to monitor and placed in left lateral position. Continuous oxygen was provided by nasal cannula and IV medicine administered through an indwelling cannula.  After administration of sedation and rectal exam, the patients rectum was intubated and the EC-3890Li ([id number])  colonoscope was advanced under direct visualization to the cecum.  The scope was removed slowly by carefully examining the color, texture, anatomy, and integrity mucosa on the way out.  The patient was recovered in endoscopy and discharged home in satisfactory condition. Estimated blood loss is zero unless otherwise noted in this procedure report.    COLON FINDINGS: There was mild diverticulosis noted in the sigmoid colon with associated muscular hypertrophy.  , A sessile polyp measuring 4 mm in size was found in the rectum.  A polypectomy was performed with cold forceps.  , and Small internal hemorrhoids were found.  PREP QUALITY: good.  CECAL W/D TIME: 15       minutes COMPLICATIONS: None  ENDOSCOPIC IMPRESSION: 1.   Mild diverticulosis in the sigmoid colon 2.   ONE RECTAL polyp REMOVED 3.   Small internal hemorrhoids  RECOMMENDATIONS: FOLLOW A HIGH FIBER DIET. AWAIT BIOPSY RESULTS. Next colonoscopy in 5-10  years.    _______________________________ eSignedWest Bali, MD 07/09/14 4:27 PM    CPT CODES: ICD CODES:  The ICD and CPT codes recommended by this software are interpretations from the data that the clinical staff has captured with the software.  The verification of the translation of this report to the ICD and CPT codes and modifiers is the sole responsibility of the health care institution and practicing physician where this report was generated.  [employer] will not be held responsible for the validity of the ICD and CPT codes included on this report.  AMA assumes no liability for data contained or not contained herein. CPT is a Publishing rights manager of the [employer].

## 2014-06-15 NOTE — Progress Notes (Signed)
REVIEWED-NO ADDITIONAL RECOMMENDATIONS. 

## 2014-06-15 NOTE — Discharge Instructions (Signed)
You had 1 small polyp removed. You have small internal hemorrhoids and diverticulosis IN YOUR LEFT COLON.   FOLLOW A HIGH FIBER DIET. AVOID ITEMS THAT CAUSE BLOATING. SEE INFO BELOW.  YOUR BIOPSY RESULTS WILL BE AVAILABLE IN MY CHART AFTER JUN 15 AND MY OFFICE WILL CONTACT YOU IN 10-14 DAYS WITH YOUR RESULTS.   Next colonoscopy in 5-10 years.   Colonoscopy Care After Read the instructions outlined below and refer to this sheet in the next week. These discharge instructions provide you with general information on caring for yourself after you leave the hospital. While your treatment has been planned according to the most current medical practices available, unavoidable complications occasionally occur. If you have any problems or questions after discharge, call DR. Glendon Fiser, [phone].  ACTIVITY  You may resume your regular activity, but move at a slower pace for the next 24 hours.   Take frequent rest periods for the next 24 hours.   Walking will help get rid of the air and reduce the bloated feeling in your belly (abdomen).   No driving for 24 hours (because of the medicine (anesthesia) used during the test).   You may shower.   Do not sign any important legal documents or operate any machinery for 24 hours (because of the anesthesia used during the test).    NUTRITION  Drink plenty of fluids.   You may resume your normal diet as instructed by your doctor.   Begin with a light meal and progress to your normal diet. Heavy or fried foods are harder to digest and may make you feel sick to your stomach (nauseated).   Avoid alcoholic beverages for 24 hours or as instructed.    MEDICATIONS  You may resume your normal medications.   WHAT YOU CAN EXPECT TODAY  Some feelings of bloating in the abdomen.   Passage of more gas than usual.   Spotting of blood in your stool or on the toilet paper  .  IF YOU HAD POLYPS REMOVED DURING THE COLONOSCOPY:  Eat a soft diet IF YOU  HAVE NAUSEA, BLOATING, ABDOMINAL PAIN, OR VOMITING.    FINDING OUT THE RESULTS OF YOUR TEST Not all test results are available during your visit. DR. Darrick Penna WILL CALL YOU WITHIN 14 DAYS OF YOUR PROCEDUE WITH YOUR RESULTS. Do not assume everything is normal if you have not heard from DR. Lavanna Rog, CALL HER OFFICE AT [phone].  SEEK IMMEDIATE MEDICAL ATTENTION AND CALL THE OFFICE: [phone] IF:  You have more than a spotting of blood in your stool.   Your belly is swollen (abdominal distention).   You are nauseated or vomiting.   You have a temperature over 101F.   You have abdominal pain or discomfort that is severe or gets worse throughout the day.  Polyps, Colon  A polyp is extra tissue that grows inside your body. Colon polyps grow in the large intestine. The large intestine, also called the colon, is part of your digestive system. It is a long, hollow tube at the end of your digestive tract where your body makes and stores stool. Most polyps are not dangerous. They are benign. This means they are not cancerous. But over time, some types of polyps can turn into cancer. Polyps that are smaller than a pea are usually not harmful. But larger polyps could someday become or may already be cancerous. To be safe, doctors remove all polyps and test them.    PREVENTION There is not one sure way to  prevent polyps. You might be able to lower your risk of getting them if you:  Eat more fruits and vegetables and less fatty food.   Do not smoke.   Avoid alcohol.   Exercise every day.   Lose weight if you are overweight.   Eating more calcium and folate can also lower your risk of getting polyps. Some foods that are rich in calcium are milk, cheese, and broccoli. Some foods that are rich in folate are chickpeas, kidney beans, and spinach.   High-Fiber Diet A high-fiber diet changes your normal diet to include more whole grains, legumes, fruits, and vegetables. Changes in the diet  involve replacing refined carbohydrates with unrefined foods. The calorie level of the diet is essentially unchanged. The Dietary Reference Intake (recommended amount) for adult males is 38 grams per day. For adult females, it is 25 grams per day. Pregnant and lactating women should consume 28 grams of fiber per day. Fiber is the intact part of a plant that is not broken down during digestion. Functional fiber is fiber that has been isolated from the plant to provide a beneficial effect in the body. PURPOSE  Increase stool bulk.   Ease and regulate bowel movements.   Lower cholesterol.   REDUCE RISK OF COLON CANCER  INDICATIONS THAT YOU NEED MORE FIBER  Constipation and hemorrhoids.   Uncomplicated diverticulosis (intestine condition) and irritable bowel syndrome.   Weight management.   As a protective measure against hardening of the arteries (atherosclerosis), diabetes, and cancer.   GUIDELINES FOR INCREASING FIBER IN THE DIET  Start adding fiber to the diet slowly. A gradual increase of about 5 more grams (2 slices of whole-wheat bread, 2 servings of most fruits or vegetables, or 1 bowl of high-fiber cereal) per day is best. Too rapid an increase in fiber may result in constipation, flatulence, and bloating.   Drink enough water and fluids to keep your urine clear or pale yellow. Water, juice, or caffeine-free drinks are recommended. Not drinking enough fluid may cause constipation.   Eat a variety of high-fiber foods rather than one type of fiber.   Try to increase your intake of fiber through using high-fiber foods rather than fiber pills or supplements that contain small amounts of fiber.   The goal is to change the types of food eaten. Do not supplement your present diet with high-fiber foods, but replace foods in your present diet.   INCLUDE A VARIETY OF FIBER SOURCES  Replace refined and processed grains with whole grains, canned fruits with fresh fruits, and incorporate  other fiber sources. White rice, white breads, and most bakery goods contain little or no fiber.   Brown whole-grain rice, buckwheat oats, and many fruits and vegetables are all good sources of fiber. These include: broccoli, Brussels sprouts, cabbage, cauliflower, beets, sweet potatoes, white potatoes (skin on), carrots, tomatoes, eggplant, squash, berries, fresh fruits, and dried fruits.   Cereals appear to be the richest source of fiber. Cereal fiber is found in whole grains and bran. Bran is the fiber-rich outer coat of cereal grain, which is largely removed in refining. In whole-grain cereals, the bran remains. In breakfast cereals, the largest amount of fiber is found in those with "bran" in their names. The fiber content is sometimes indicated on the label.   You may need to include additional fruits and vegetables each day.   In baking, for 1 cup white flour, you may use the following substitutions:   1 cup whole-wheat flour  minus 2 tablespoons.   1/2 cup white flour plus 1/2 cup whole-wheat flour.   Diverticulosis Diverticulosis is a common condition that develops when small pouches (diverticula) form in the wall of the colon. The risk of diverticulosis increases with age. It happens more often in people who eat a low-fiber diet. Most individuals with diverticulosis have no symptoms. Those individuals with symptoms usually experience belly (abdominal) pain, constipation, or loose stools (diarrhea).  HOME CARE INSTRUCTIONS  Increase the amount of fiber in your diet as directed by your caregiver or dietician. This may reduce symptoms of diverticulosis.   Drink at least 6 to 8 glasses of water each day to prevent constipation.   Try not to strain when you have a bowel movement.   Avoiding nuts and seeds to prevent complications is NOT NECESSARY.   FOODS HAVING HIGH FIBER CONTENT INCLUDE:  Fruits. Apple, peach, pear, tangerine, raisins, prunes.   Vegetables. Brussels sprouts,  asparagus, broccoli, cabbage, carrot, cauliflower, romaine lettuce, spinach, summer squash, tomato, winter squash, zucchini.   Starchy Vegetables. Baked beans, kidney beans, lima beans, split peas, lentils, potatoes (with skin).   Grains. Whole wheat bread, brown rice, bran flake cereal, plain oatmeal, white rice, shredded wheat, bran muffins.   Hemorrhoids Hemorrhoids are dilated (enlarged) veins around the rectum. Sometimes clots will form in the veins. This makes them swollen and painful. These are called thrombosed hemorrhoids. Causes of hemorrhoids include:  Constipation.   Straining to have a bowel movement.   HEAVY LIFTING  HOME CARE INSTRUCTIONS  Eat a well balanced diet and drink 6 to 8 glasses of water every day to avoid constipation. You may also use a bulk laxative.   Avoid straining to have bowel movements.   Keep anal area dry and clean.   Do not use a donut shaped pillow or sit on the toilet for long periods. This increases blood pooling and pain.   Move your bowels when your body has the urge; this will require less straining and will decrease pain and pressure.

## 2014-06-18 ENCOUNTER — Encounter (HOSPITAL_COMMUNITY): Payer: Self-pay | Admitting: Gastroenterology

## 2014-07-02 ENCOUNTER — Telehealth: Payer: Self-pay | Admitting: Gastroenterology

## 2014-07-02 NOTE — Telephone Encounter (Signed)
Please call pt. He had A HYPERPLASTIC POLYP removed.   FOLLOW A HIGH FIBER DIET. AVOID ITEMS THAT CAUSE BLOATING & GAS.  Next colonoscopy in 10 years.    

## 2014-07-02 NOTE — Telephone Encounter (Signed)
Reminder in epic °

## 2014-07-04 NOTE — Telephone Encounter (Signed)
Pt called and was informed of results.  

## 2014-07-04 NOTE — Telephone Encounter (Signed)
LMOM to call. Letter mailed to call also.  

## 2014-07-11 ENCOUNTER — Telehealth: Payer: Self-pay | Admitting: Family Medicine

## 2014-07-11 MED ORDER — OLMESARTAN MEDOXOMIL-HCTZ 20-12.5 MG PO TABS: 1.0000 | ORAL_TABLET | Freq: Every day | ORAL | Status: DC

## 2014-07-11 NOTE — Telephone Encounter (Signed)
He is on the olmesartan HCTZ, refill this

## 2014-07-11 NOTE — Telephone Encounter (Signed)
Refill for generic to pharmacy

## 2014-07-11 NOTE — Telephone Encounter (Signed)
Rec'd PA request from pharmacy for Benicar/HCTZ 20-12.5.  I do not see this on patient's med list??

## 2014-07-12 ENCOUNTER — Telehealth: Payer: Self-pay | Admitting: Family Medicine

## 2014-07-12 NOTE — Telephone Encounter (Signed)
PA has been approved from 07/12/2014 to 01/04/2038.  Pharmacy made aware.

## 2014-07-12 NOTE — Telephone Encounter (Signed)
PA has been submitted through "CoverMyMeds"  For Benicar/HCTZ 20-12.5 (olmesartan/hct)  Case [id number].

## 2014-07-26 ENCOUNTER — Other Ambulatory Visit: Payer: Self-pay | Admitting: Family Medicine

## 2014-07-26 NOTE — Telephone Encounter (Signed)
LRF 04/02/14 #60 + 2.  LOV 04/02/14  OK refill?

## 2014-07-26 NOTE — Telephone Encounter (Signed)
Patient is calling to get refill on his diazepam  Suella Broad  [phone]

## 2014-07-27 MED ORDER — DIAZEPAM 5 MG PO TABS: 5.0000 mg | ORAL_TABLET | Freq: Two times a day (BID) | ORAL | Status: DC | PRN

## 2014-07-27 NOTE — Telephone Encounter (Signed)
Okay to refill? 

## 2014-07-27 NOTE — Telephone Encounter (Signed)
Medication refilled per protocol. 

## 2014-09-05 ENCOUNTER — Encounter: Payer: Self-pay | Admitting: Family Medicine

## 2014-09-05 ENCOUNTER — Ambulatory Visit (INDEPENDENT_AMBULATORY_CARE_PROVIDER_SITE_OTHER): Payer: BLUE CROSS/BLUE SHIELD | Admitting: Family Medicine

## 2014-09-05 VITALS — BP 98/58 | HR 60 | Temp 99.0°F | Resp 12 | Ht 71.0 in | Wt 204.0 lb

## 2014-09-05 DIAGNOSIS — I959 Hypotension, unspecified: Secondary | ICD-10-CM | POA: Diagnosis not present

## 2014-09-05 DIAGNOSIS — F1010 Alcohol abuse, uncomplicated: Secondary | ICD-10-CM | POA: Diagnosis not present

## 2014-09-05 LAB — COMPREHENSIVE METABOLIC PANEL
ALK PHOS: 85 U/L (ref 40–115)
ALT: 27 U/L (ref 9–46)
AST: 41 U/L — ABNORMAL HIGH (ref 10–35)
Albumin: 4.1 g/dL (ref 3.6–5.1)
BUN: 23 mg/dL (ref 7–25)
CALCIUM: 9.4 mg/dL (ref 8.6–10.3)
CO2: 23 mmol/L (ref 20–31)
Chloride: 100 mmol/L (ref 98–110)
Creat: 1.92 mg/dL — ABNORMAL HIGH (ref 0.70–1.33)
Glucose, Bld: 124 mg/dL — ABNORMAL HIGH (ref 70–99)
POTASSIUM: 4.4 mmol/L (ref 3.5–5.3)
Sodium: 141 mmol/L (ref 135–146)
TOTAL PROTEIN: 7.5 g/dL (ref 6.1–8.1)
Total Bilirubin: 0.7 mg/dL (ref 0.2–1.2)

## 2014-09-05 LAB — CBC W/MCH & 3 PART DIFF
HCT: 38.4 % — ABNORMAL LOW (ref 39.0–52.0)
Hemoglobin: 13.4 g/dL (ref 13.0–17.0)
Lymphocytes Relative: 9 % — ABNORMAL LOW (ref 12–46)
Lymphs Abs: 0.7 10*3/uL (ref 0.7–4.0)
MCH: 39.1 pg — ABNORMAL HIGH (ref 26.0–34.0)
MCHC: 34.9 g/dL (ref 30.0–36.0)
MCV: 112 fL — AB (ref 78.0–100.0)
Neutro Abs: 6.9 10*3/uL (ref 1.7–7.7)
Neutrophils Relative %: 87 % — ABNORMAL HIGH (ref 43–77)
Platelets: 207 10*3/uL (ref 150–400)
RBC: 3.43 MIL/uL — AB (ref 4.22–5.81)
RDW: 14.2 % (ref 11.5–15.5)
WBC mixed population %: 4 % (ref 3–18)
WBC: 7.9 10*3/uL (ref 4.0–10.5)
WBCMIX: 0.3 10*3/uL (ref 0.1–1.8)

## 2014-09-05 NOTE — Patient Instructions (Signed)
Stop the blood pressure medication We will call with lab results Drink fluids, eat on regular basis Look at the resources for alcohol abuse  F/U next Wed for recheck

## 2014-09-05 NOTE — Assessment & Plan Note (Signed)
I think his hypotension is a result of him not eating and his excessive alcohol intake. I am been a check his sodium level his potassium renal function and liver function. There was no sign of infection today in the office. I will have him hold his blood pressure medication for now. I gave him resources for detox programs but he continues to decline this. He is insignificant jeopardy of being fired because of multiple dates where he is called out from his alcoholism.

## 2014-09-05 NOTE — Progress Notes (Signed)
Patient ID: Justin Fleming, male   DOB: 10/23/1960, 54 y.o.   MRN: [id number]   Subjective:    Patient ID: Justin Fleming, male    DOB: 1960-04-01, 54 y.o.   MRN: [id number]  Patient presents for Illness  patient here secondary to illness. He was actually sent home from work because he was not feeling well and was dizzy. He tells me that he drank a significant amount of alcohol yesterday and he thinks he was too much. He would not tell me the exact on the. He states that he still drinking a lot especially on his days off. He said that he just felt a Justin weak and dizzy he had not eaten anything this morning before he went in to work he was also taken his blood pressure was low by the nurse at work. After he was given something to eat he felt much better. We started discussing history can habits and how it affected his job he states he is hard in jeopardy of losing his job he is called out multiple times when he is hung over and has accumulated too many points. He still declines going to a facility for detox or getting any help for his alcohol abuse.  He denies any abdominal pain denies a nausea vomiting diarrhea denies any fever or recent illness. He has not taken any other substances. He did state that the Benicar was costing him $88 a month therefore he missed it for couple days but then restarted on Sunday.  Review Of Systems:  GEN- +fatigue, fever, weight loss,weakness, recent illness HEENT- denies eye drainage, change in vision, nasal discharge, CVS- denies chest pain, palpitations RESP- denies SOB, cough, wheeze ABD- denies N/V, change in stools, abd pain GU- denies dysuria, hematuria, dribbling, incontinence MSK- denies joint pain, muscle aches, injury Neuro- denies headache, +dizziness, syncope, seizure activity       Objective:    BP 98/58 mmHg  Pulse 60  Temp(Src) 99 F (37.2 C) (Oral)  Resp 12  Ht  (1.803 m)  Wt 204 lb (92.534 kg)  BMI 28.46 kg/m2 GEN- NAD, alert  and oriented x3 HEENT- PERRL, EOMI, non injected sclera, pink conjunctiva, MMM, oropharynx clear Neck- Supple, no thyromegaly CVS- RRR, no murmur RESP-CTAB ABD-NABS,soft,NT,ND EXT- No edema Neuro- tremor noted unchanged, no new neurological deficits Psych- fair eye contact, flat today, not overly anxious Pulses- Radial - 2+        Assessment & Plan:      Problem List Items Addressed This Visit    Alcohol abuse    I think his hypotension is a result of him not eating and his excessive alcohol intake. I am been a check his sodium level his potassium renal function and liver function. There was no sign of infection today in the office. I will have him hold his blood pressure medication for now. I gave him resources for detox programs but he continues to decline this. He is insignificant jeopardy of being fired because of multiple dates where he is called out from his alcoholism.      Relevant Orders   Ethanol    Other Visit Diagnoses    Hypotension, unspecified hypotension type    -  Primary    D/C Benicar HCTZ    Relevant Orders    CBC w/MCH & 3 Part Diff (Completed)    Comprehensive metabolic panel       Note: This dictation was prepared with Dragon dictation along with smaller  Company secretary. Any transcriptional errors that result from this process are unintentional.

## 2014-09-10 LAB — ETHANOL, CLINICAL: ETHANOL, CLINICAL: 0.07 g/dL — AB

## 2014-09-12 ENCOUNTER — Ambulatory Visit (INDEPENDENT_AMBULATORY_CARE_PROVIDER_SITE_OTHER): Payer: BLUE CROSS/BLUE SHIELD | Admitting: Family Medicine

## 2014-09-12 ENCOUNTER — Encounter: Payer: Self-pay | Admitting: Family Medicine

## 2014-09-12 VITALS — BP 140/86 | HR 88 | Temp 98.5°F | Resp 16 | Ht 71.0 in | Wt 206.0 lb

## 2014-09-12 DIAGNOSIS — F1020 Alcohol dependence, uncomplicated: Secondary | ICD-10-CM

## 2014-09-12 DIAGNOSIS — F411 Generalized anxiety disorder: Secondary | ICD-10-CM | POA: Diagnosis not present

## 2014-09-12 DIAGNOSIS — F329 Major depressive disorder, single episode, unspecified: Secondary | ICD-10-CM

## 2014-09-12 DIAGNOSIS — R4589 Other symptoms and signs involving emotional state: Secondary | ICD-10-CM

## 2014-09-12 DIAGNOSIS — I10 Essential (primary) hypertension: Secondary | ICD-10-CM

## 2014-09-12 DIAGNOSIS — G4700 Insomnia, unspecified: Secondary | ICD-10-CM | POA: Diagnosis not present

## 2014-09-12 DIAGNOSIS — N179 Acute kidney failure, unspecified: Secondary | ICD-10-CM | POA: Diagnosis not present

## 2014-09-12 LAB — BASIC METABOLIC PANEL WITH GFR
BUN: 8 mg/dL (ref 7–25)
CO2: 26 mmol/L (ref 20–31)
CREATININE: 0.99 mg/dL (ref 0.70–1.33)
Calcium: 9.3 mg/dL (ref 8.6–10.3)
Chloride: 99 mmol/L (ref 98–110)
GFR, Est African American: >89 mL/min (ref >=60)
GFR, Est Non African American: 85 mL/min (ref >=60)
Glucose, Bld: 80 mg/dL (ref 70–99)
Potassium: 4.1 mmol/L (ref 3.5–5.3)
Sodium: 141 mmol/L (ref 135–146)

## 2014-09-12 MED ORDER — TRAZODONE HCL 50 MG PO TABS: 25.0000 mg | ORAL_TABLET | Freq: Every evening | ORAL | Status: DC | PRN

## 2014-09-12 NOTE — Assessment & Plan Note (Signed)
Blood pressure has rebounded back up which I suspected. If his labs are okay him and have him take one half tablet of the Benicar 20/12.5 mg

## 2014-09-12 NOTE — Patient Instructions (Signed)
Plan to cut your blood pressure medicine in half  We will call with lab results Try AA meeting Try the trazodone for sleep  F/U 2 month

## 2014-09-12 NOTE — Assessment & Plan Note (Signed)
He has underlying anxiety however think that he has some depression as well as he will not let alone 2. There is a reason that he continues to drink so heavily and now is in jeopardy of losing his job. Will give a trial of trazodone to see if this helps his sleep as well as his mood. He declines taking any SSRIs he was on Prozac in the past. He will continue the Valium 5 mg advised not to take this with ETOH

## 2014-09-12 NOTE — Assessment & Plan Note (Signed)
I discussed intervention with him we even discussed Alcoholics Anonymous he states that he has a cousin that dose and he will try going to meetings with him.

## 2014-09-12 NOTE — Progress Notes (Signed)
Patient ID: Justin Fleming, male   DOB: [birth year], 54 y.o.   MRN: [id number]   Subjective:    Patient ID: Justin Fleming, male    DOB: [birth year], 54 y.o.   MRN: [id number]  Patient presents for 1 week F/U   she finds on follow-up on hypotension alcohol abuse and acute renal failure. His creatinine returned at 1.9 . I had him hold his blood pressure medicine he states that he feels much better today. He is also trying to cut back on his alcohol. His complaint today is he's had difficulty sleeping he does not think that the diagnosis same as what they were previously giving him at the drugstore. He is a problem sleeping and states it is just been getting worse and he did not say anything. At the end of the visit he brought up that he was having some problems getting his urine stream out and felt weaker this is been going on for the past few months as well but then he did not want any intervention for this.   Review Of Systems:  GEN- denies fatigue, fever, weight loss,weakness, recent illness HEENT- denies eye drainage, change in vision, nasal discharge, CVS- denies chest pain, palpitations RESP- denies SOB, cough, wheeze ABD- denies N/V, change in stools, abd pain GU- denies dysuria, hematuria, dribbling, incontinence MSK- denies joint pain, muscle aches, injury Neuro- denies headache, dizziness, syncope, seizure activity       Objective:    BP 140/86 mmHg  Pulse 88  Temp(Src) 98.5 F (36.9 C) (Oral)  Resp 16  Ht  (1.803 m)  Wt 206 lb (93.441 kg)  BMI 28.74 kg/m2 GEN- NAD, alert and oriented x3 Psych- depressed affect, not anxious, seemed tearful at times, no SI/HI         Assessment & Plan:      Problem List Items Addressed This Visit    Insomnia - Primary   GAD (generalized anxiety disorder)     He has underlying anxiety however think that he has some depression as well as he will not let alone 2. There is a reason that he continues to drink so heavily and now  is in jeopardy of losing his job. Will give a trial of trazodone to see if this helps his sleep as well as his mood. He declines taking any SSRIs he was on Prozac in the past. He will continue the Valium 5 mg advised not to take this with ETOH      EtOH dependence   Essential hypertension, benign     Blood pressure has rebounded back up which I suspected. If his labs are okay him and have him take one half tablet of the Benicar 20/12.5 mg      Acute renal failure   Relevant Orders   BASIC METABOLIC PANEL WITH GFR      Note: This dictation was prepared with Dragon dictation along with smaller phrase technology. Any transcriptional errors that result from this process are unintentional.

## 2014-11-05 ENCOUNTER — Other Ambulatory Visit: Payer: Self-pay

## 2014-11-05 ENCOUNTER — Emergency Department (HOSPITAL_COMMUNITY): Payer: Self-pay

## 2014-11-05 ENCOUNTER — Encounter (HOSPITAL_COMMUNITY): Payer: Self-pay | Admitting: Emergency Medicine

## 2014-11-05 ENCOUNTER — Observation Stay (HOSPITAL_COMMUNITY)
Admission: EM | Admit: 2014-11-05 | Discharge: 2014-11-05 | Disposition: A | Discharge status: Home / Self Care | Payer: Self-pay | Attending: Emergency Medicine | Admitting: Emergency Medicine

## 2014-11-05 ENCOUNTER — Telehealth: Payer: Self-pay | Admitting: Family Medicine

## 2014-11-05 DIAGNOSIS — R079 Chest pain, unspecified: Principal | ICD-10-CM | POA: Insufficient documentation

## 2014-11-05 DIAGNOSIS — F10930 Alcohol use, unspecified with withdrawal, uncomplicated: Secondary | ICD-10-CM

## 2014-11-05 DIAGNOSIS — F10239 Alcohol dependence with withdrawal, unspecified: Secondary | ICD-10-CM | POA: Insufficient documentation

## 2014-11-05 DIAGNOSIS — K219 Gastro-esophageal reflux disease without esophagitis: Secondary | ICD-10-CM | POA: Insufficient documentation

## 2014-11-05 DIAGNOSIS — F419 Anxiety disorder, unspecified: Secondary | ICD-10-CM | POA: Insufficient documentation

## 2014-11-05 DIAGNOSIS — F329 Major depressive disorder, single episode, unspecified: Secondary | ICD-10-CM | POA: Insufficient documentation

## 2014-11-05 DIAGNOSIS — E875 Hyperkalemia: Secondary | ICD-10-CM | POA: Diagnosis present

## 2014-11-05 DIAGNOSIS — Z87891 Personal history of nicotine dependence: Secondary | ICD-10-CM | POA: Insufficient documentation

## 2014-11-05 DIAGNOSIS — I10 Essential (primary) hypertension: Secondary | ICD-10-CM | POA: Insufficient documentation

## 2014-11-05 DIAGNOSIS — F10230 Alcohol dependence with withdrawal, uncomplicated: Secondary | ICD-10-CM

## 2014-11-05 DIAGNOSIS — R1013 Epigastric pain: Secondary | ICD-10-CM | POA: Insufficient documentation

## 2014-11-05 DIAGNOSIS — Z79899 Other long term (current) drug therapy: Secondary | ICD-10-CM | POA: Insufficient documentation

## 2014-11-05 HISTORY — DX: Alcohol abuse, uncomplicated: F10.10

## 2014-11-05 LAB — COMPREHENSIVE METABOLIC PANEL
ALBUMIN: 4.5 g/dL (ref 3.5–5.0)
ALT: 57 U/L (ref 17–63)
AST: 58 U/L — AB (ref 15–41)
Alkaline Phosphatase: 73 U/L (ref 38–126)
Anion gap: 18 — ABNORMAL HIGH (ref 5–15)
BILIRUBIN TOTAL: 1.9 mg/dL — AB (ref 0.3–1.2)
BUN: 10 mg/dL (ref 6–20)
CO2: 24 mmol/L (ref 22–32)
CREATININE: 1.08 mg/dL (ref 0.61–1.24)
Calcium: 8.8 mg/dL — ABNORMAL LOW (ref 8.9–10.3)
Chloride: 98 mmol/L — ABNORMAL LOW (ref 101–111)
GFR calc Af Amer: >60 mL/min (ref >60)
GFR calc non Af Amer: >60 mL/min (ref >60)
Glucose, Bld: 95 mg/dL (ref 65–99)
POTASSIUM: 4 mmol/L (ref 3.5–5.1)
Sodium: 140 mmol/L (ref 135–145)
TOTAL PROTEIN: 8 g/dL (ref 6.5–8.1)

## 2014-11-05 LAB — I-STAT TROPONIN, ED
TROPONIN I, POC: 0.02 ng/mL (ref 0.00–0.08)
Troponin i, poc: 0.01 ng/mL (ref 0.00–0.08)

## 2014-11-05 LAB — CBC WITH DIFFERENTIAL/PLATELET
Basophils Absolute: 0 10*3/uL (ref 0.0–0.1)
Basophils Relative: 0 %
Eosinophils Absolute: 0 10*3/uL (ref 0.0–0.7)
Eosinophils Relative: 0 %
HCT: 40.4 % (ref 39.0–52.0)
Hemoglobin: 14.2 g/dL (ref 13.0–17.0)
LYMPHS PCT: 25 %
Lymphs Abs: 1.2 10*3/uL (ref 0.7–4.0)
MCH: 37.5 pg — AB (ref 26.0–34.0)
MCHC: 35.1 g/dL (ref 30.0–36.0)
MCV: 106.6 fL — AB (ref 78.0–100.0)
Monocytes Absolute: 0.4 10*3/uL (ref 0.1–1.0)
Monocytes Relative: 8 %
Neutro Abs: 3.1 10*3/uL (ref 1.7–7.7)
Neutrophils Relative %: 67 %
PLATELETS: 200 10*3/uL (ref 150–400)
RBC: 3.79 MIL/uL — ABNORMAL LOW (ref 4.22–5.81)
RDW: 12.7 % (ref 11.5–15.5)
WBC: 4.7 10*3/uL (ref 4.0–10.5)

## 2014-11-05 LAB — ETHANOL: Alcohol, Ethyl (B): 52 mg/dL — ABNORMAL HIGH (ref <5)

## 2014-11-05 MED ORDER — SODIUM CHLORIDE 0.9 % IV BOLUS (SEPSIS)
1000.0000 mL | Freq: Once | INTRAVENOUS | Status: AC
  Administered 2014-11-05: 1000 mL via INTRAVENOUS

## 2014-11-05 MED ORDER — LORAZEPAM 2 MG/ML IJ SOLN
3.0000 mg | Freq: Once | INTRAMUSCULAR | Status: AC
  Administered 2014-11-05: 3 mg via INTRAVENOUS
  Filled 2014-11-05: qty 2

## 2014-11-05 MED ORDER — DIAZEPAM 5 MG PO TABS
10.0000 mg | ORAL_TABLET | Freq: Once | ORAL | Status: AC
  Administered 2014-11-05: 10 mg via ORAL
  Filled 2014-11-05: qty 2

## 2014-11-05 MED ORDER — ASPIRIN 81 MG PO CHEW
324.0000 mg | CHEWABLE_TABLET | Freq: Once | ORAL | Status: AC
  Administered 2014-11-05: 324 mg via ORAL
  Filled 2014-11-05: qty 4

## 2014-11-05 MED ORDER — DIAZEPAM 5 MG PO TABS
15.0000 mg | ORAL_TABLET | Freq: Once | ORAL | Status: AC
Start: 2014-11-05 — End: 2014-11-05
  Administered 2014-11-05: 15 mg via ORAL
  Filled 2014-11-05: qty 3

## 2014-11-05 MED ORDER — LORAZEPAM 2 MG/ML IJ SOLN
1.0000 mg | Freq: Once | INTRAMUSCULAR | Status: AC
  Administered 2014-11-05: 1 mg via INTRAVENOUS
  Filled 2014-11-05: qty 1

## 2014-11-05 MED ORDER — DIAZEPAM 5 MG PO TABS
10.0000 mg | ORAL_TABLET | Freq: Once | ORAL | Status: DC
Start: 2014-11-05 — End: 2014-11-05
  Filled 2014-11-05: qty 2

## 2014-11-05 MED ORDER — IRBESARTAN 150 MG PO TABS
150.0000 mg | ORAL_TABLET | Freq: Every day | ORAL | Status: DC
  Administered 2014-11-05: 150 mg via ORAL
  Filled 2014-11-05 (×3): qty 1

## 2014-11-05 MED ORDER — HYDROCHLOROTHIAZIDE 12.5 MG PO CAPS
12.5000 mg | ORAL_CAPSULE | Freq: Every day | ORAL | Status: DC
  Administered 2014-11-05: 12.5 mg via ORAL
  Filled 2014-11-05 (×3): qty 1

## 2014-11-05 MED ORDER — LORAZEPAM 2 MG/ML IJ SOLN
2.0000 mg | Freq: Once | INTRAMUSCULAR | Status: AC
  Administered 2014-11-05: 2 mg via INTRAVENOUS
  Filled 2014-11-05: qty 1

## 2014-11-05 MED ORDER — DIAZEPAM 5 MG PO TABS
10.0000 mg | ORAL_TABLET | Freq: Once | ORAL | Status: AC
  Administered 2014-11-05: 10 mg via ORAL

## 2014-11-05 NOTE — ED Notes (Signed)
Spoke with Justin Fleming at [facility] who states that they are working with patient regarding alcohol treatment.  Pt needed to be medically cleared by ED for this.  Also needed bp control before they can work on placement.  Needed alcohol level added.

## 2014-11-05 NOTE — ED Notes (Signed)
BP elevated at [facility] at 0800.  Pt not sure of readings.

## 2014-11-05 NOTE — Telephone Encounter (Signed)
Patients niece patsy calling to talk with you regarding him being committed for trying to commit suicide [phone]
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
## 2014-11-05 NOTE — ED Notes (Signed)
Dr Little at bedside,  

## 2014-11-05 NOTE — Discharge Instructions (Signed)
Alcohol Withdrawal  When a person who drinks a lot of alcohol stops drinking, he or she may go through alcohol withdrawal. Alcohol withdrawal causes problems. It can make you feel:  · Tired (fatigued).  · Sad (depressed).  · Fearful (anxious).  · Grouchy (irritable).  · Not hungry.  · Sick to your stomach (nauseous).  · Shaky.  It can also make you have:  · Nightmares.  · Trouble sleeping.  · Trouble thinking clearly.  · Mood swings.  · Clammy skin.  · Very bad sweating.  · A very fast heartbeat.  · Shaking that you cannot control (tremor).  · Having a fever.  · A fit of movements that you cannot control (seizure).  · Confusion.  · Throwing up (vomiting).  · Feeling or seeing things that are not there (hallucinations).  HOME CARE  · Take medicines and vitamins only as told by your doctor.  · Do not drink alcohol.  · Have someone around in case you need help.  · Drink enough fluid to keep your pee (urine) clear or pale yellow.  · Think about joining a group to help you stop drinking.  GET HELP IF:  · Your problems get worse.  · Your problems do not go away.  · You cannot eat or drink without throwing up.  · You are having a hard time not drinking alcohol.  · You cannot stop drinking alcohol.  GET HELP RIGHT AWAY IF:  · You feel your heart beating differently than usual.  · Your chest hurts.  · You have trouble breathing.  · You have very bad problems, like:    A fever.    A fit of movements that you cannot control.    Being very confused.    Feeling or seeing things that are not there.     This information is not intended to replace advice given to you by your health care provider. Make sure you discuss any questions you have with your health care provider.     Document Released: 06/10/2007 Document Revised: 01/12/2014 Document Reviewed: 10/10/2013  Elsevier Interactive Patient Education ©2016 [employer]vier Inc[employer]

## 2014-11-05 NOTE — ED Notes (Signed)
Dr little at bedside,

## 2014-11-05 NOTE — ED Notes (Signed)
Pt and family updated on plan of care,  

## 2014-11-05 NOTE — ED Notes (Signed)
Pt says he has chest pain over one week to left chest.  Pt says his PCP says he has acid reflux.  EKG done.

## 2014-11-05 NOTE — ED Notes (Signed)
Pt sent here from [facility] for evaluation of high blood pressure. Pt states he has a drinking problem and the plan was to send him some where for help. Pt has no paper work to indicated placement. Nurse [facility] plans for pt. [phone] ext [phone]

## 2014-11-06 ENCOUNTER — Telehealth: Payer: Self-pay | Admitting: Family Medicine

## 2014-11-06 ENCOUNTER — Encounter: Payer: Self-pay | Admitting: Family Medicine

## 2014-11-06 ENCOUNTER — Ambulatory Visit (INDEPENDENT_AMBULATORY_CARE_PROVIDER_SITE_OTHER): Payer: Self-pay | Admitting: Family Medicine

## 2014-11-06 VITALS — BP 156/102 | HR 96 | Temp 98.7°F | Resp 18 | Ht 71.0 in | Wt 198.0 lb

## 2014-11-06 DIAGNOSIS — F10250 Alcohol dependence with alcohol-induced psychotic disorder with delusions: Secondary | ICD-10-CM

## 2014-11-06 DIAGNOSIS — F333 Major depressive disorder, recurrent, severe with psychotic symptoms: Secondary | ICD-10-CM

## 2014-11-06 DIAGNOSIS — F10959 Alcohol use, unspecified with alcohol-induced psychotic disorder, unspecified: Secondary | ICD-10-CM | POA: Insufficient documentation

## 2014-11-06 DIAGNOSIS — I10 Essential (primary) hypertension: Secondary | ICD-10-CM

## 2014-11-06 DIAGNOSIS — F10950 Alcohol use, unspecified with alcohol-induced psychotic disorder with delusions: Secondary | ICD-10-CM

## 2014-11-06 MED ORDER — HYDROCHLOROTHIAZIDE 25 MG PO TABS: 25.0000 mg | ORAL_TABLET | Freq: Every day | ORAL | Status: DC

## 2014-11-06 MED ORDER — LISINOPRIL 40 MG PO TABS: 40.0000 mg | ORAL_TABLET | Freq: Every day | ORAL | Status: DC

## 2014-11-06 MED ORDER — LORAZEPAM 1 MG PO TABS: 1.0000 mg | ORAL_TABLET | Freq: Three times a day (TID) | ORAL | Status: DC

## 2014-11-06 MED ORDER — OLMESARTAN MEDOXOMIL-HCTZ 20-12.5 MG PO TABS: 2.0000 | ORAL_TABLET | Freq: Every day | ORAL | Status: DC

## 2014-11-06 NOTE — Telephone Encounter (Signed)
Changed to lisinopril 40mg , HCTZ 25mg  D/C Benicar

## 2014-11-06 NOTE — Assessment & Plan Note (Signed)
He has some alcohol induced psychosis as well.

## 2014-11-06 NOTE — Telephone Encounter (Signed)
Call placed to pharmacy and patient family.

## 2014-11-06 NOTE — Telephone Encounter (Signed)
The Benicar/Hctz cost was $369.00  Is the a less expensive alternative?

## 2014-11-06 NOTE — Assessment & Plan Note (Signed)
Significant alcohol dependence  I's and approximate 45 minutes with the patient and his family trying to come up with the plan for him to have detox. [facility]'s [facility] toxo must bear in delirium tremens which she is not. I called around to [facility] as well as a [facility] program.  Unfortunately because he no longer has insurance the family does not have amount of money needed upfront to [facility]. We've made a plan for them to contact [facility] who was helping them initially since he has been medically cleared. I will follow-up with the family tomorrow

## 2014-11-06 NOTE — Assessment & Plan Note (Signed)
He has not been taking his trazodone as described. I discussed with the family that they will need to give him a little bit of alcohol this evening to prevent withdrawal symptoms. I have also prescribed Ativan to use instead of the Valium to help with his anxiety and withdrawal symptoms. Hopefully we can get him into a facility tomorrow with the help of [facility]
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
## 2014-11-06 NOTE — Assessment & Plan Note (Signed)
2/2 non compliance with medication, he has now been taking but BP still elevated, unable to  Afford Benicar therefore changed to lisinopril 40 mg hydrochlorothiazide 25 mg

## 2014-11-06 NOTE — Patient Instructions (Addendum)
Take 2 blood pressure medications We will help with the intake places Give ativan 1 mg Three times a day to prevent withdrawels Do not take Valium

## 2014-11-07 ENCOUNTER — Emergency Department
Admission: EM | Admit: 2014-11-07 | Discharge: 2014-11-08 | Disposition: A | Discharge status: Home / Self Care | Payer: Self-pay | Attending: Emergency Medicine | Admitting: Emergency Medicine

## 2014-11-07 DIAGNOSIS — Z79899 Other long term (current) drug therapy: Secondary | ICD-10-CM | POA: Insufficient documentation

## 2014-11-07 DIAGNOSIS — Z87891 Personal history of nicotine dependence: Secondary | ICD-10-CM | POA: Insufficient documentation

## 2014-11-07 DIAGNOSIS — F1010 Alcohol abuse, uncomplicated: Secondary | ICD-10-CM | POA: Insufficient documentation

## 2014-11-07 DIAGNOSIS — I10 Essential (primary) hypertension: Secondary | ICD-10-CM | POA: Insufficient documentation

## 2014-11-07 NOTE — ED Notes (Signed)
Patient here for medical clearance for [facility].

## 2014-11-07 NOTE — ED Notes (Signed)
Pt admits to drinking about a pint of liquor per day. Pt last drink was today around 4am. Pt lost his job about a month ago. Pt calm and cooperative with no complaints at this time. Pt denies SI or HI.

## 2014-11-07 NOTE — BHH Counselor (Signed)
Spoke to Justin Fleming at [facility] who reports that pt waited too late to come to the ED for medical clearance.  There are no beds available.

## 2014-11-08 ENCOUNTER — Telehealth: Payer: Self-pay | Admitting: *Deleted

## 2014-11-08 NOTE — Telephone Encounter (Signed)
Call placed to patient daughter to F/U.   Was advised by patsy that [facility] and advised to got to [facility]. States that patient did go to [facility], but no resolution was found there.   Patient daughter contacted [facility] [place] and was notified that bed is available if patient could be there at 11am on 11/08/2014.patient and family is en route.
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
## 2014-11-09 NOTE — Telephone Encounter (Signed)
noted 

## 2014-11-12 ENCOUNTER — Telehealth: Payer: Self-pay | Admitting: Family Medicine

## 2014-11-12 NOTE — Telephone Encounter (Signed)
Okay to hold meds, will see in office tomorrow

## 2014-11-12 NOTE — Telephone Encounter (Signed)
Pt's niece called to speak with Dr. Deirdre Peerurham's nurse regarding his blood pressure. He was discharged from detox at 8 am this morning, during his stay his blood pressure was very unstable and she wants to know if Dr. Jeanice Limurham wants him to come into the office to be seen. Please call [name] @ [phone]

## 2014-11-12 NOTE — Telephone Encounter (Signed)
Call placed to [name], patient daughter.   Reports that patient BO noted to have dropped during Detox. States that BP meds were held d/t hypotension (90/70, 88/50)  Reports that patient released from [facility] today at 8 am and BP noted at 118/80 at that time.   Advised to continue to hold BP medications and to F/U in office on 11/13/2014.Appointment scheduled.

## 2014-11-13 ENCOUNTER — Ambulatory Visit: Payer: Self-pay | Admitting: Family Medicine

## 2014-11-23 ENCOUNTER — Telehealth: Payer: Self-pay | Admitting: Family Medicine

## 2014-11-23 NOTE — Telephone Encounter (Signed)
All pt how is he doing since rehab?  He missed his last appt for his blood pressure recheck. Can he reschedule or at least give us his BP readings?

## 2014-11-23 NOTE — Telephone Encounter (Signed)
Call placed to both patient and patient daughter Marcelino Freestoneatsy Galloway.   LMTRC.

## 2014-11-27 NOTE — Telephone Encounter (Signed)
Call placed to patient.   Reports that BP has been on the high end of normal, but has not been out of normal limits.   States that BP has been running 130-140/ 80-90.  Call placed to patient daughter to confirm. LMTRC.   MD to be made aware.

## 2014-11-27 NOTE — Telephone Encounter (Signed)
Call placed to patient.   States that he recently began HCTZ again.   Advised to monitor BP QD and log recordings. Advised to call on Monday with readings.

## 2014-11-27 NOTE — Telephone Encounter (Signed)
Is he taking any meds? If not take the HCTZ 25mg  once a day only.  Call back with BP readings in 1 week

## 2015-01-29 ENCOUNTER — Encounter: Payer: Self-pay | Admitting: Family Medicine

## 2015-01-29 ENCOUNTER — Ambulatory Visit (INDEPENDENT_AMBULATORY_CARE_PROVIDER_SITE_OTHER): Payer: Self-pay | Admitting: Family Medicine

## 2015-01-29 VITALS — BP 118/68 | HR 72 | Temp 99.7°F | Resp 18 | Ht 70.0 in | Wt 194.0 lb

## 2015-01-29 DIAGNOSIS — I10 Essential (primary) hypertension: Secondary | ICD-10-CM

## 2015-01-29 DIAGNOSIS — F333 Major depressive disorder, recurrent, severe with psychotic symptoms: Secondary | ICD-10-CM

## 2015-01-29 DIAGNOSIS — F1010 Alcohol abuse, uncomplicated: Secondary | ICD-10-CM

## 2015-01-29 DIAGNOSIS — G4700 Insomnia, unspecified: Secondary | ICD-10-CM

## 2015-01-29 MED ORDER — LORAZEPAM 1 MG PO TABS: 1.0000 mg | ORAL_TABLET | Freq: Two times a day (BID) | ORAL | Status: DC

## 2015-01-29 MED ORDER — HYDROCHLOROTHIAZIDE 25 MG PO TABS: 25.0000 mg | ORAL_TABLET | Freq: Every day | ORAL | Status: DC

## 2015-01-29 MED ORDER — LISINOPRIL 40 MG PO TABS: 40.0000 mg | ORAL_TABLET | Freq: Every day | ORAL | Status: DC

## 2015-01-29 MED ORDER — TRAZODONE HCL 50 MG PO TABS: 25.0000 mg | ORAL_TABLET | Freq: Every evening | ORAL | Status: DC | PRN

## 2015-01-29 NOTE — Assessment & Plan Note (Signed)
Pressures much improved continue current medication his renal function was normal his last set of labs. We will wait and get labs again and his 6 month visit and he is currently uninsured.

## 2015-01-29 NOTE — Assessment & Plan Note (Signed)
Depression with anxiety. Continue with trazodone as well as lorazepam

## 2015-01-29 NOTE — Progress Notes (Signed)
Patient ID: Justin Fleming, male   DOB: [birth year], 55 y.o.   MRN: [id number]   Subjective:    Patient ID: Justin Fleming, male    DOB: [birth year], 55 y.o.   MRN: [id number]  Patient presents for F/U  here to follow-up medications. Since her last visit he did complete 4 days in rehabilitation he was alcohol free until right before Christmas he states that he ran out of his Ativan and his shakes started getting bad so he started drinking a little. He is now only drinking on the weekend. He is also currently job hunting and no stent he cannot have the alcohol like he did before. He would like to restart Ativan as this kept him from drinking. He is taking his blood pressure medicine as prescribed and has not had any side effects. He is also taking the trazodone at night.    Review Of Systems:  GEN- denies fatigue, fever, weight loss,weakness, recent illness HEENT- denies eye drainage, change in vision, nasal discharge, CVS- denies chest pain, palpitations RESP- denies SOB, cough, wheeze ABD- denies N/V, change in stools, abd pain GU- denies dysuria, hematuria, dribbling, incontinence MSK- denies joint pain, muscle aches, injury Neuro- denies headache, dizziness, syncope, seizure activity       Objective:    BP 118/68 mmHg  Pulse 72  Temp(Src) 99.7 F (37.6 C) (Oral)  Resp 18  Ht  (1.778 m)  Wt 194 lb (87.998 kg)  BMI 27.84 kg/m2 GEN- NAD, alert and oriented x3 HEENT- PERRL, EOMI, non injected sclera, pink conjunctiva, MMM, oropharynx clear CVS- RRR, no murmur RESP-CTAB Psych- normal affect and mood  EXT- No edema Pulses- Radial- 2+        Assessment & Plan:      Problem List Items Addressed This Visit    MDD (major depressive disorder) (HCC)     Depression with anxiety. Continue with trazodone as well as lorazepam      Relevant Medications   LORazepam (ATIVAN) 1 MG tablet   traZODone (DESYREL) 50 MG tablet   Insomnia   Essential hypertension, benign -  Primary     Pressures much improved continue current medication his renal function was normal his last set of labs. We will wait and get labs again and his 6 month visit and he is currently uninsured.      Relevant Medications   hydrochlorothiazide (HYDRODIURIL) 25 MG tablet   lisinopril (PRINIVIL,ZESTRIL) 40 MG tablet   Alcohol abuse     I recommended that he go to AA as he needs more support to keep him from using alcohol. I plan to restart the Ativan at 1 mg twice a day advised in the stop drinking completely even on the weekends. He was sober today in the office.         Note: This dictation was prepared with Dragon dictation along with smaller phrase technology. Any transcriptional errors that result from this process are unintentional.

## 2015-01-29 NOTE — Patient Instructions (Signed)
F/U 6 months

## 2015-01-29 NOTE — Assessment & Plan Note (Signed)
I recommended that he go to AA as he needs more support to keep him from using alcohol. I plan to restart the Ativan at 1 mg twice a day advised in the stop drinking completely even on the weekends. He was sober today in the office.

## 2016-04-02 IMAGING — DX DG CHEST 2V
2 series · 2 of 2 positions shown · non-contrast
Comparison: None

CLINICAL DATA: Anterior LEFT chest pressure, hypertension, GERD

EXAM:
CHEST  2 VIEW

[chest pa]
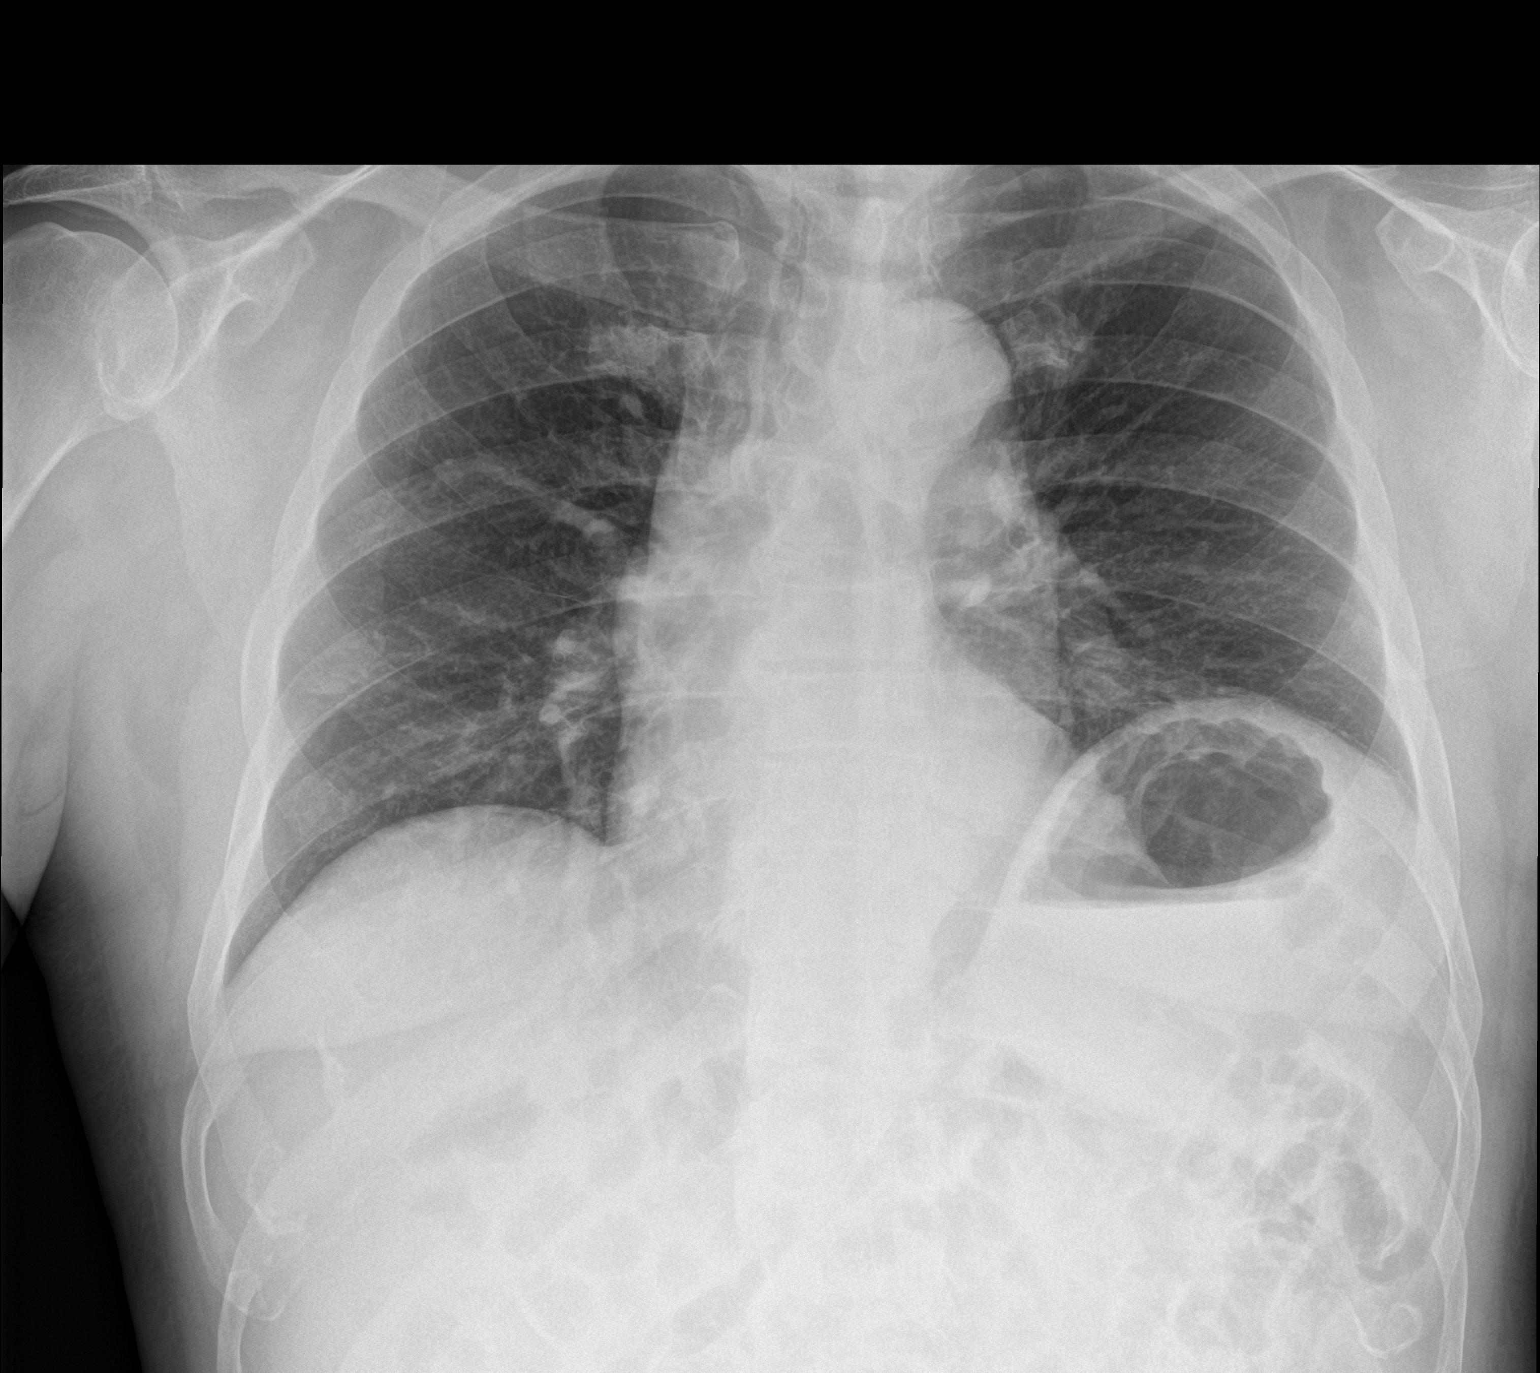

[chest lat]
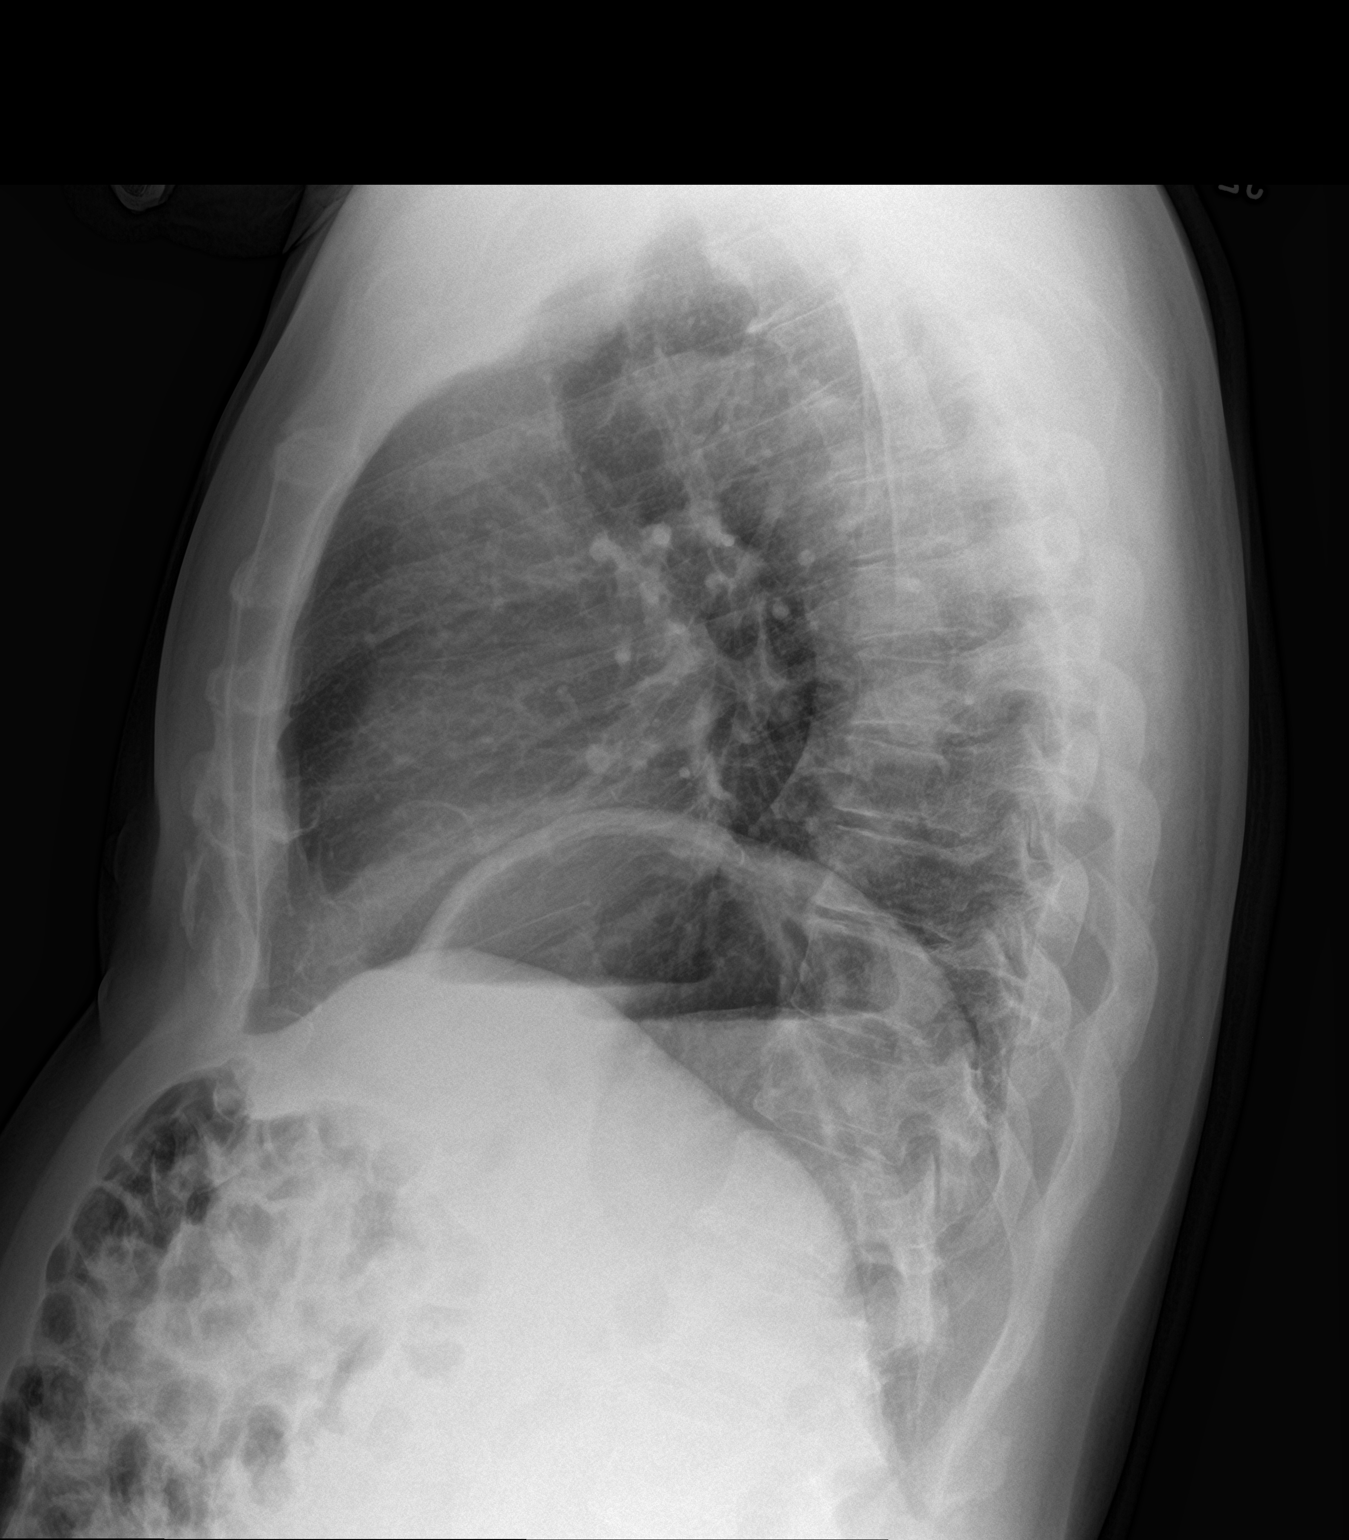

[2 of 2 positions shown; findings below may reference images not displayed]

FINDINGS: Normal heart size and pulmonary vascularity.

Tortuous aorta.

Eventration LEFT diaphragm.

Bronchitic changes with minimal bibasilar atelectasis.

Upper lungs clear.

No pleural effusion or pneumothorax.

Bones unremarkable.
IMPRESSION: Bronchitic changes with minimal bibasilar atelectasis.

## 2017-02-26 ENCOUNTER — Encounter (HOSPITAL_COMMUNITY): Payer: Self-pay | Admitting: *Deleted

## 2017-02-26 ENCOUNTER — Emergency Department (HOSPITAL_COMMUNITY): Payer: Self-pay

## 2017-02-26 ENCOUNTER — Emergency Department (HOSPITAL_COMMUNITY)
Admission: EM | Admit: 2017-02-26 | Discharge: 2017-02-27 | Disposition: A | Discharge status: Home / Self Care | Payer: Self-pay | Attending: Emergency Medicine | Admitting: Emergency Medicine

## 2017-02-26 DIAGNOSIS — Z87891 Personal history of nicotine dependence: Secondary | ICD-10-CM | POA: Insufficient documentation

## 2017-02-26 DIAGNOSIS — I10 Essential (primary) hypertension: Secondary | ICD-10-CM | POA: Insufficient documentation

## 2017-02-26 DIAGNOSIS — F10920 Alcohol use, unspecified with intoxication, uncomplicated: Secondary | ICD-10-CM | POA: Insufficient documentation

## 2017-02-26 DIAGNOSIS — R400 Somnolence: Secondary | ICD-10-CM

## 2017-02-26 DIAGNOSIS — Y908 Blood alcohol level of 240 mg/100 ml or more: Secondary | ICD-10-CM | POA: Insufficient documentation

## 2017-02-26 LAB — URINALYSIS, ROUTINE W REFLEX MICROSCOPIC
BACTERIA UA: NONE SEEN
BILIRUBIN URINE: NEGATIVE
Glucose, UA: NEGATIVE mg/dL
KETONES UR: NEGATIVE mg/dL
LEUKOCYTES UA: NEGATIVE
NITRITE: NEGATIVE
PH: 5 (ref 5.0–8.0)
PROTEIN: NEGATIVE mg/dL
SQUAMOUS EPITHELIAL / LPF: NONE SEEN
Specific Gravity, Urine: 1.005 (ref 1.005–1.030)

## 2017-02-26 LAB — RAPID URINE DRUG SCREEN, HOSP PERFORMED
Amphetamines: NOT DETECTED
Barbiturates: NOT DETECTED
Benzodiazepines: NOT DETECTED
COCAINE: NOT DETECTED
OPIATES: NOT DETECTED
TETRAHYDROCANNABINOL: NOT DETECTED

## 2017-02-26 LAB — COMPREHENSIVE METABOLIC PANEL
ALK PHOS: 69 U/L (ref 38–126)
ALT: 21 U/L (ref 17–63)
ANION GAP: 12 (ref 5–15)
AST: 23 U/L (ref 15–41)
Albumin: 3.9 g/dL (ref 3.5–5.0)
BUN: 8 mg/dL (ref 6–20)
CALCIUM: 8.9 mg/dL (ref 8.9–10.3)
CO2: 27 mmol/L (ref 22–32)
CREATININE: 1.3 mg/dL — AB (ref 0.61–1.24)
Chloride: 107 mmol/L (ref 101–111)
GFR, EST NON AFRICAN AMERICAN: 59 mL/min — AB (ref >60)
Glucose, Bld: 104 mg/dL — ABNORMAL HIGH (ref 65–99)
Potassium: 4.1 mmol/L (ref 3.5–5.1)
SODIUM: 146 mmol/L — AB (ref 135–145)
Total Bilirubin: 0.4 mg/dL (ref 0.3–1.2)
Total Protein: 8 g/dL (ref 6.5–8.1)

## 2017-02-26 LAB — ETHANOL: ALCOHOL ETHYL (B): 298 mg/dL — AB (ref <10)

## 2017-02-26 LAB — CBC WITH DIFFERENTIAL/PLATELET
Basophils Absolute: 0.1 10*3/uL (ref 0.0–0.1)
Basophils Relative: 1 %
EOS PCT: 2 %
Eosinophils Absolute: 0.2 10*3/uL (ref 0.0–0.7)
HCT: 43.8 % (ref 39.0–52.0)
HEMOGLOBIN: 14.4 g/dL (ref 13.0–17.0)
LYMPHS ABS: 3.9 10*3/uL (ref 0.7–4.0)
LYMPHS PCT: 48 %
MCH: 34.8 pg — AB (ref 26.0–34.0)
MCHC: 32.9 g/dL (ref 30.0–36.0)
MCV: 105.8 fL — AB (ref 78.0–100.0)
MONOS PCT: 8 %
Monocytes Absolute: 0.6 10*3/uL (ref 0.1–1.0)
Neutro Abs: 3.3 10*3/uL (ref 1.7–7.7)
Neutrophils Relative %: 41 %
Platelets: 351 10*3/uL (ref 150–400)
RBC: 4.14 MIL/uL — ABNORMAL LOW (ref 4.22–5.81)
RDW: 12.3 % (ref 11.5–15.5)
WBC: 8.1 10*3/uL (ref 4.0–10.5)

## 2017-02-26 LAB — CBG MONITORING, ED: GLUCOSE-CAPILLARY: 75 mg/dL (ref 65–99)

## 2017-02-26 NOTE — ED Notes (Signed)
Clothing removed, pt placed in gown.
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
## 2018-02-02 ENCOUNTER — Ambulatory Visit: Payer: Self-pay | Admitting: Family Medicine

## 2018-02-02 ENCOUNTER — Other Ambulatory Visit: Payer: Self-pay

## 2018-02-02 ENCOUNTER — Encounter: Payer: Self-pay | Admitting: Family Medicine

## 2018-02-02 VITALS — BP 164/98 | HR 78 | Temp 98.7°F | Resp 14 | Ht 70.0 in | Wt 218.0 lb

## 2018-02-02 DIAGNOSIS — F1010 Alcohol abuse, uncomplicated: Secondary | ICD-10-CM

## 2018-02-02 DIAGNOSIS — I10 Essential (primary) hypertension: Secondary | ICD-10-CM

## 2018-02-02 MED ORDER — LISINOPRIL 10 MG PO TABS: 10.0000 mg | ORAL_TABLET | Freq: Every day | ORAL | 3 refills | Status: DC

## 2018-02-02 NOTE — Progress Notes (Signed)
   Subjective:    Patient ID: Justin Fleming, male    DOB: Dec 21, 1960, 58 y.o.   MRN: [id number]  Patient presents for Follow-up (has not been seen >3 years- is not fasting)  Patient here to follow-up his medications.  He has not been seen in 3 years.  He lost his job secondary to his alcohol abuse.  He states he is currently in DUI classes 1-2 times a week.  He however continues to drink.  He is not ready to quit.  He states that he feels fine.  His blood pressure has been significantly elevated over the years.  He has been in the ER couple times over the past few years for chest pain.  He was last on lisinopril and HCTZ for his blood pressure.  He denies any headache shortness of breath dizzy spells or chest pain currently.  No recent illness.  He declines influenza.  He states that he is uninsured and is living off of his savings.   Review Of Systems:  GEN- denies fatigue, fever, weight loss,weakness, recent illness HEENT- denies eye drainage, change in vision, nasal discharge, CVS- denies chest pain, palpitations RESP- denies SOB, cough, wheeze ABD- denies N/V, change in stools, abd pain GU- denies dysuria, hematuria, dribbling, incontinence MSK- denies joint pain, muscle aches, injury Neuro- denies headache, dizziness, syncope, seizure activity       Objective:    BP (!) 164/98 (BP Location: Right Arm, Patient Position: Sitting, Cuff Size: Normal)   Pulse 78   Temp 98.7 F (37.1 C) (Oral)   Resp 14   Ht 5\' 10"  (1.778 m)   Wt 218 lb (98.9 kg)   SpO2 99%   BMI 31.28 kg/m  GEN- NAD, alert and oriented x3 HEENT- PERRL, EOMI, non injected sclera, pink conjunctiva, MMM, oropharynx clear Neck- Supple, no thyromegaly CVS- RRR, no murmur RESP-CTAB ABD-NABS,soft,NT,ND Psych- normal affect and mood, not anxious appearing, no tremor noted EXT- No edema Pulses- Radial 2+        Assessment & Plan:      Problem List Items Addressed This Visit      Unprioritized   Alcohol  abuse   Essential hypertension, benign - Primary    Pressure is uncontrolled in the setting of his alcohol abuse.  He is also gained 25 pounds over the past 3 years.  He is not taking care of himself and admits to this.  His sister is also patient has been helping him and also states that all he does is drink.  When to restart the lisinopril he states he has had urinary frequency but no dysuria so not going to put him back on the HCTZ and I worry about dehydration in setting of his alcohol.  He did tolerate the lisinopril before.  We will check a metabolic panel but unable to get CBC and lipid panel as he is uninsured.  We discussed sending him to the health department for care.      Relevant Medications   lisinopril (PRINIVIL,ZESTRIL) 10 MG tablet   Other Relevant Orders   Comprehensive metabolic panel      Note: This dictation was prepared with Dragon dictation along with smaller phrase technology. Any transcriptional errors that result from this process are unintentional.

## 2018-02-02 NOTE — Assessment & Plan Note (Signed)
Pressure is uncontrolled in the setting of his alcohol abuse.  He is also gained 25 pounds over the past 3 years.  He is not taking care of himself and admits to this.  His sister is also patient has been helping him and also states that all he does is drink.  When to restart the lisinopril he states he has had urinary frequency but no dysuria so not going to put him back on the HCTZ and I worry about dehydration in setting of his alcohol.  He did tolerate the lisinopril before.  We will check a metabolic panel but unable to get CBC and lipid panel as he is uninsured.  We discussed sending him to the health department for care.

## 2018-02-02 NOTE — Patient Instructions (Addendum)
Recommend you look at health department until you get insurance Start the blood pressure medication We will call with results F/U 1 month if you are able

## 2018-02-04 LAB — COMPREHENSIVE METABOLIC PANEL
AG RATIO: 1.2 (calc) (ref 1.0–2.5)
ALT: 5 U/L — AB (ref 9–46)
AST: 10 U/L (ref 10–35)
Albumin: 4.2 g/dL (ref 3.6–5.1)
Alkaline phosphatase (APISO): 70 U/L (ref 40–115)
BUN: 13 mg/dL (ref 7–25)
CO2: 26 mmol/L (ref 20–32)
Calcium: 10 mg/dL (ref 8.6–10.3)
Chloride: 98 mmol/L (ref 98–110)
Creat: 1.15 mg/dL (ref 0.70–1.25)
GLOBULIN: 3.6 g/dL (ref 1.9–3.7)
GLUCOSE: 90 mg/dL (ref 65–99)
Potassium: 4.3 mmol/L (ref 3.5–5.3)
SODIUM: 138 mmol/L (ref 135–146)
TOTAL PROTEIN: 7.8 g/dL (ref 6.1–8.1)
Total Bilirubin: 1.1 mg/dL (ref 0.2–1.2)

## 2018-02-21 ENCOUNTER — Telehealth: Payer: Self-pay | Admitting: Family Medicine

## 2018-02-21 NOTE — Telephone Encounter (Signed)
I spoke with Marcelino Freestone on pt behalf, they will to get into the home.  He had locked the doors he has been doing this more often because he is paranoid.  He also gets worried people follow him when he leaves the house.  He has been drinking more which is his typical.  He has not been belligerent or physical with anyone.  I discussed taking him to the emergency room for detox but they do not feel like he is any worse than his typical.  Also given information for [facility] in [place].  His niece also wants to call us back for referral to a specific psychiatrist that she works with will await that calls I do not know who she is referring to at this time.

## 2018-02-21 NOTE — Telephone Encounter (Signed)
Call placed to patient niece, Marcelino Freestone to inquire.   Reports that patient is drinking excessively and is more paranoid since neighboring land is having a house built on it. States that patient was drunk over weekend and was walking in the road in [place] and was noticed stumbling while walking. Neighbor called niece and she attempted to contact patient via phone. Was unable to contact patient, so she went to paitient home and noted that screen doors were locked so that she could not enter. Reports that she was yelling at patient through the window to get him to unlock the door. States that she advised patient that if she could not get into home, she would call Sheriff Page for a wellness check.   [name] reports that she is concerned as he is acting out similar to when he had breakdown previously. States that she would like to try to get patient in psychiatry. Is concerned that she will not be able to get patient to return to PCP office and go to psych if referral is placed.   States that her cousin will call with name of psychiatrist she would prefer patient to go to.   Advised that if patient is in crisis, or is vocalizing desire to harm self or others, he needs to go to ER for behavioral health evaluation. States that she does not think it is at that point yet.   MD please advise.

## 2018-02-21 NOTE — Telephone Encounter (Signed)
Pt needs referral to psych dr in [place] does not have name rn but will call back.

## 2019-03-31 ENCOUNTER — Ambulatory Visit: Payer: Self-pay

## 2019-03-31 ENCOUNTER — Ambulatory Visit: Payer: Self-pay | Attending: Internal Medicine

## 2019-03-31 DIAGNOSIS — Z23 Encounter for immunization: Secondary | ICD-10-CM

## 2019-03-31 NOTE — Progress Notes (Signed)
   Covid-19 Vaccination Clinic  Name:  Justin Fleming    MRN: [id number] DOB: June 18, 1960  03/31/2019  [name] was observed post Covid-19 immunization for 15 minutes without incident. He was provided with Vaccine Information Sheet and instruction to access the V-Safe system.   [name] was instructed to call 911 with any severe reactions post vaccine: Marland Kitchen Difficulty breathing  . Swelling of face and throat  . A fast heartbeat  . A bad rash all over body  . Dizziness and weakness   Immunizations Administered    Name Date Dose VIS Date Route   Moderna COVID-19 Vaccine 03/31/2019 10:00 AM 0.5 mL 12/06/2018 Intramuscular   Manufacturer: Moderna   Lot: [id number]   NDC: 17616-073-71

## 2019-05-02 ENCOUNTER — Ambulatory Visit: Payer: Self-pay | Attending: Internal Medicine

## 2019-05-02 DIAGNOSIS — Z23 Encounter for immunization: Secondary | ICD-10-CM

## 2019-05-02 NOTE — Progress Notes (Signed)
   Covid-19 Vaccination Clinic  Name:  Justin Fleming    MRN: [id number] DOB: 01-26-1960  05/02/2019  [name] was observed post Covid-19 immunization for 15 minutes without incident. He was provided with Vaccine Information Sheet and instruction to access the V-Safe system.   [name] was instructed to call 911 with any severe reactions post vaccine: Marland Kitchen Difficulty breathing  . Swelling of face and throat  . A fast heartbeat  . A bad rash all over body  . Dizziness and weakness   Immunizations Administered    Name Date Dose VIS Date Route   Moderna COVID-19 Vaccine 05/02/2019  9:55 AM 0.5 mL 12/2018 Intramuscular   Manufacturer: Moderna   Lot: [id number]   NDC: 59563-875-64

## 2020-02-16 ENCOUNTER — Ambulatory Visit: Payer: Self-pay | Admitting: Family Medicine

## 2022-12-29 ENCOUNTER — Other Ambulatory Visit: Payer: Self-pay

## 2022-12-29 ENCOUNTER — Inpatient Hospital Stay (HOSPITAL_COMMUNITY)
Admission: EM | Admit: 2022-12-29 | Discharge: 2023-01-16 | DRG: 291 | Disposition: A | Discharge status: Skilled Nursing Facility | Payer: Medicare Other | Attending: Internal Medicine | Admitting: Internal Medicine

## 2022-12-29 ENCOUNTER — Emergency Department (HOSPITAL_COMMUNITY): Payer: Medicare Other

## 2022-12-29 DIAGNOSIS — I110 Hypertensive heart disease with heart failure: Principal | ICD-10-CM | POA: Diagnosis present

## 2022-12-29 DIAGNOSIS — F17200 Nicotine dependence, unspecified, uncomplicated: Secondary | ICD-10-CM | POA: Diagnosis present

## 2022-12-29 DIAGNOSIS — I5A Non-ischemic myocardial injury (non-traumatic): Secondary | ICD-10-CM | POA: Diagnosis present

## 2022-12-29 DIAGNOSIS — K8020 Calculus of gallbladder without cholecystitis without obstruction: Secondary | ICD-10-CM | POA: Diagnosis present

## 2022-12-29 DIAGNOSIS — Z79899 Other long term (current) drug therapy: Secondary | ICD-10-CM

## 2022-12-29 DIAGNOSIS — N179 Acute kidney failure, unspecified: Secondary | ICD-10-CM | POA: Diagnosis present

## 2022-12-29 DIAGNOSIS — N289 Disorder of kidney and ureter, unspecified: Secondary | ICD-10-CM

## 2022-12-29 DIAGNOSIS — I340 Nonrheumatic mitral (valve) insufficiency: Secondary | ICD-10-CM | POA: Diagnosis present

## 2022-12-29 DIAGNOSIS — Z7141 Alcohol abuse counseling and surveillance of alcoholic: Secondary | ICD-10-CM

## 2022-12-29 DIAGNOSIS — E872 Acidosis, unspecified: Secondary | ICD-10-CM | POA: Diagnosis present

## 2022-12-29 DIAGNOSIS — Z66 Do not resuscitate: Secondary | ICD-10-CM | POA: Diagnosis not present

## 2022-12-29 DIAGNOSIS — I2489 Other forms of acute ischemic heart disease: Secondary | ICD-10-CM | POA: Diagnosis present

## 2022-12-29 DIAGNOSIS — F1010 Alcohol abuse, uncomplicated: Secondary | ICD-10-CM | POA: Diagnosis present

## 2022-12-29 DIAGNOSIS — R7989 Other specified abnormal findings of blood chemistry: Secondary | ICD-10-CM | POA: Diagnosis present

## 2022-12-29 DIAGNOSIS — E538 Deficiency of other specified B group vitamins: Secondary | ICD-10-CM | POA: Diagnosis present

## 2022-12-29 DIAGNOSIS — I959 Hypotension, unspecified: Secondary | ICD-10-CM | POA: Diagnosis present

## 2022-12-29 DIAGNOSIS — I5031 Acute diastolic (congestive) heart failure: Secondary | ICD-10-CM | POA: Insufficient documentation

## 2022-12-29 DIAGNOSIS — K2970 Gastritis, unspecified, without bleeding: Secondary | ICD-10-CM | POA: Diagnosis present

## 2022-12-29 DIAGNOSIS — Z1152 Encounter for screening for COVID-19: Secondary | ICD-10-CM

## 2022-12-29 DIAGNOSIS — D509 Iron deficiency anemia, unspecified: Secondary | ICD-10-CM | POA: Diagnosis present

## 2022-12-29 DIAGNOSIS — F32A Depression, unspecified: Secondary | ICD-10-CM | POA: Diagnosis present

## 2022-12-29 DIAGNOSIS — I10 Essential (primary) hypertension: Secondary | ICD-10-CM | POA: Diagnosis present

## 2022-12-29 DIAGNOSIS — J351 Hypertrophy of tonsils: Secondary | ICD-10-CM | POA: Diagnosis present

## 2022-12-29 DIAGNOSIS — Z87891 Personal history of nicotine dependence: Secondary | ICD-10-CM

## 2022-12-29 DIAGNOSIS — I4819 Other persistent atrial fibrillation: Secondary | ICD-10-CM

## 2022-12-29 DIAGNOSIS — J690 Pneumonitis due to inhalation of food and vomit: Secondary | ICD-10-CM | POA: Diagnosis present

## 2022-12-29 DIAGNOSIS — K219 Gastro-esophageal reflux disease without esophagitis: Secondary | ICD-10-CM | POA: Diagnosis present

## 2022-12-29 DIAGNOSIS — A419 Sepsis, unspecified organism: Principal | ICD-10-CM

## 2022-12-29 DIAGNOSIS — M1000 Idiopathic gout, unspecified site: Secondary | ICD-10-CM

## 2022-12-29 DIAGNOSIS — R21 Rash and other nonspecific skin eruption: Secondary | ICD-10-CM | POA: Diagnosis not present

## 2022-12-29 DIAGNOSIS — J189 Pneumonia, unspecified organism: Secondary | ICD-10-CM

## 2022-12-29 DIAGNOSIS — G9341 Metabolic encephalopathy: Secondary | ICD-10-CM | POA: Diagnosis present

## 2022-12-29 DIAGNOSIS — Z6832 Body mass index (BMI) 32.0-32.9, adult: Secondary | ICD-10-CM

## 2022-12-29 DIAGNOSIS — J9601 Acute respiratory failure with hypoxia: Secondary | ICD-10-CM | POA: Diagnosis present

## 2022-12-29 DIAGNOSIS — E860 Dehydration: Secondary | ICD-10-CM | POA: Diagnosis present

## 2022-12-29 DIAGNOSIS — M25461 Effusion, right knee: Secondary | ICD-10-CM | POA: Diagnosis not present

## 2022-12-29 DIAGNOSIS — M109 Gout, unspecified: Secondary | ICD-10-CM | POA: Diagnosis present

## 2022-12-29 DIAGNOSIS — F10139 Alcohol abuse with withdrawal, unspecified: Secondary | ICD-10-CM | POA: Diagnosis not present

## 2022-12-29 DIAGNOSIS — Y636 Underdosing and nonadministration of necessary drug, medicament or biological substance: Secondary | ICD-10-CM | POA: Diagnosis present

## 2022-12-29 DIAGNOSIS — I4891 Unspecified atrial fibrillation: Secondary | ICD-10-CM | POA: Diagnosis present

## 2022-12-29 DIAGNOSIS — E876 Hypokalemia: Secondary | ICD-10-CM

## 2022-12-29 DIAGNOSIS — Z5329 Procedure and treatment not carried out because of patient's decision for other reasons: Secondary | ICD-10-CM | POA: Diagnosis not present

## 2022-12-29 DIAGNOSIS — K921 Melena: Secondary | ICD-10-CM | POA: Diagnosis not present

## 2022-12-29 DIAGNOSIS — Y908 Blood alcohol level of 240 mg/100 ml or more: Secondary | ICD-10-CM | POA: Diagnosis present

## 2022-12-29 DIAGNOSIS — R188 Other ascites: Secondary | ICD-10-CM | POA: Diagnosis present

## 2022-12-29 DIAGNOSIS — D649 Anemia, unspecified: Secondary | ICD-10-CM

## 2022-12-29 DIAGNOSIS — I5033 Acute on chronic diastolic (congestive) heart failure: Secondary | ICD-10-CM | POA: Diagnosis present

## 2022-12-29 DIAGNOSIS — F10920 Alcohol use, unspecified with intoxication, uncomplicated: Secondary | ICD-10-CM

## 2022-12-29 DIAGNOSIS — F10120 Alcohol abuse with intoxication, uncomplicated: Secondary | ICD-10-CM | POA: Diagnosis present

## 2022-12-29 DIAGNOSIS — D539 Nutritional anemia, unspecified: Secondary | ICD-10-CM | POA: Diagnosis present

## 2022-12-29 DIAGNOSIS — K760 Fatty (change of) liver, not elsewhere classified: Secondary | ICD-10-CM | POA: Diagnosis present

## 2022-12-29 DIAGNOSIS — Z7901 Long term (current) use of anticoagulants: Secondary | ICD-10-CM

## 2022-12-29 DIAGNOSIS — D62 Acute posthemorrhagic anemia: Secondary | ICD-10-CM | POA: Diagnosis present

## 2022-12-29 DIAGNOSIS — F10929 Alcohol use, unspecified with intoxication, unspecified: Secondary | ICD-10-CM | POA: Diagnosis present

## 2022-12-29 DIAGNOSIS — E66811 Obesity, class 1: Secondary | ICD-10-CM | POA: Diagnosis present

## 2022-12-29 MED ORDER — METOPROLOL TARTRATE 5 MG/5ML IV SOLN
2.5000 mg | Freq: Once | INTRAVENOUS | Status: AC
  Administered 2022-12-29: 2.5 mg via INTRAVENOUS
  Filled 2022-12-29: qty 5

## 2022-12-29 MED ORDER — SODIUM CHLORIDE 0.9 % IV SOLN
500.0000 mg | Freq: Once | INTRAVENOUS | Status: AC
  Administered 2022-12-30: 500 mg via INTRAVENOUS
  Filled 2022-12-29: qty 5

## 2022-12-29 MED ORDER — SODIUM CHLORIDE 0.9 % IV SOLN
2.0000 g | Freq: Once | INTRAVENOUS | Status: AC
  Administered 2022-12-30: 2 g via INTRAVENOUS
  Filled 2022-12-29: qty 20

## 2022-12-29 NOTE — ED Triage Notes (Signed)
Pt bib [facility], per EMS employee at gas station called saying there was man sitting in a car in the parking lot. Upon EMS arrival pt was found in his car with no heat on, and holding a bottle of liquor. LKW is unknown, but EMS reports pt had been at gas station appox 1 hour prior to calling 911.   Pt alert but will not answer questions, cold to the touch. Tachy @ 167.   Family at bedside

## 2022-12-29 NOTE — Progress Notes (Signed)
PHARMACY - ANTICOAGULATION CONSULT NOTE  Pharmacy Consult for heparin Indication: atrial fibrillation  No Known Allergies  Patient Measurements: Height: 5\' 10"  (177.8 cm) Weight: 98.2 kg (216 lb 7.9 oz) IBW/kg (Calculated) : 73 Heparin Dosing Weight: 93 kg  Vital Signs: Temp: 96.3 F (35.7 C) (12/24 2337) Temp Source: Rectal (12/24 2337) BP: 106/48 (12/24 2327) Pulse Rate: 115 (12/24 2330)  Labs: Recent Labs    12/30/22 0001  HGB 11.5*  HCT 34.3*  PLT 203    CrCl cannot be calculated (Patient's most recent lab result is older than the maximum 21 days allowed.).  Assessment: 28 yoM presented to the ED with altered mental status after being found down in car. Patient found to have atrial fibrillation w/ RVR. Pharmacy consulkted to dose for new onset atrial fibrillation.  -Hgb 11.5, plts 203 -no PTA anticoagulation reported -CHADS-VASc: 1 (but given out of system for few years, may be higher)  Goal of Therapy:  Heparin level 0.3-0.7 units/ml Monitor platelets by anticoagulation protocol: Yes   Plan:  Give 5500 units bolus x 1 Start heparin infusion at 1350 units/hr Check anti-Xa level in 8 hours and daily while on heparin Continue to monitor H&H and platelets  Arabella Merles, PharmD. Clinical Pharmacist 12/30/2022 12:29 AM

## 2022-12-29 NOTE — ED Notes (Signed)
Checked pts CBG and it was 79.

## 2022-12-29 NOTE — Progress Notes (Incomplete)
PHARMACY - ANTICOAGULATION CONSULT NOTE  Pharmacy Consult for heparin Indication: atrial fibrillation  No Known Allergies  Patient Measurements:   Heparin Dosing Weight: ***  Vital Signs: Temp: 96.3 F (35.7 C) (12/24 2337) Temp Source: Rectal (12/24 2337) BP: 106/48 (12/24 2327) Pulse Rate: 115 (12/24 2330)  Labs: No results for input(s): "HGB", "HCT", "PLT", "APTT", "LABPROT", "INR", "HEPARINUNFRC", "HEPRLOWMOCWT", "CREATININE", "CKTOTAL", "CKMB", "TROPONINIHS" in the last 72 hours.  CrCl cannot be calculated (Patient's most recent lab result is older than the maximum 21 days allowed.).   Medical History: Past Medical History:  Diagnosis Date  . Alcohol abuse    Rehab 2016  . Anxiety   . Depression   . GERD (gastroesophageal reflux disease)   . Headache   . Hypertension     Medications:  {Meds:3041526:p}  Assessment: *** Goal of Therapy:  {Goals:3041534} {Monitor platelets by anticoagulation protocol:3041561::"Monitor platelets by anticoagulation protocol: Yes"}   Plan:  {NWGN:5621308}  Justin Fleming 12/29/2022,11:54 PM

## 2022-12-29 NOTE — ED Notes (Signed)
ED Provider at bedside. 

## 2022-12-29 NOTE — ED Provider Notes (Signed)
EMERGENCY DEPARTMENT AT [facility] Provider Note   CSN: [id number] Arrival date & time: 12/29/22  2237     History {Add pertinent medical, surgical, social history, OB history to HPI:1} Chief Complaint  Patient presents with   Altered Mental Status    Justin Fleming is a 62 y.o. male.  The history is provided by the EMS personnel and a relative. The history is limited by the condition of the patient (Altered mental status).  Altered Mental Status He has history of hypertension, depression, alcohol abuse and was brought in by ambulance after being found in a car with no heat on and holding a bottle of alcohol.  Family who is here to states they have not seen him in about 1 month.  I am not able to get any useful information from him.   Home Medications Prior to Admission medications   Medication Sig Start Date End Date Taking? Authorizing Provider  lisinopril (PRINIVIL,ZESTRIL) 10 MG tablet Take 1 tablet (10 mg total) by mouth daily. 02/02/18   Salley Scarlet, MD      Allergies    Patient has no known allergies.    Review of Systems   Review of Systems  Unable to perform ROS: Mental status change    Physical Exam Updated Vital Signs BP 99/87   Pulse (!) 165   Resp 20   SpO2 91%  Physical Exam Vitals and nursing note reviewed.   62 year old male, resting comfortably and in no acute distress. Vital signs are significant for rapid heart rate. Oxygen saturation is 91%, which is normal. Head is normocephalic and atraumatic. PERRLA, EOMI.  Neck is nontender and supple without adenopathy or JVD. Back is nontender and there is no CVA tenderness. Lungs are clear without rales, wheezes, or rhonchi. Chest is nontender. Heart is tachycardic and irregular without murmur. Abdomen is soft, flat, nontender. Extremities have 1-2+ edema. Skin is warm and dry without rash. Neurologic: Awake but slow to answer questions, oriented to person but not place or  time.  Cranial nerves grossly intact.  Moves all extremities equally.  ED Results / Procedures / Treatments   Labs (all labs ordered are listed, but only abnormal results are displayed) Labs Reviewed  COMPREHENSIVE METABOLIC PANEL  CBC WITH DIFFERENTIAL/PLATELET  ETHANOL  URINALYSIS, W/ REFLEX TO CULTURE (INFECTION SUSPECTED)  RAPID URINE DRUG SCREEN, HOSP PERFORMED  LACTIC ACID, PLASMA  LACTIC ACID, PLASMA  AMMONIA  MAGNESIUM  CBG MONITORING, ED  TROPONIN I (HIGH SENSITIVITY)    EKG EKG Interpretation Date/Time:  Tuesday December 29 2022 22:57:06 EST Ventricular Rate:  159 PR Interval:    QRS Duration:  98 QT Interval:  325 QTC Calculation: 529 R Axis:   -67  Text Interpretation: Atrial fibrillation with rapid V-rate LAD, consider LAFB or inferior infarct Low voltage, precordial leads Consider anterior infarct Nonspecific T abnormalities, lateral leads When compared with ECG of 02/26/2017, Atrial fibrillation with rapid ventricular response has replaced Sinus rhythm Confirmed by Dione Booze ([id number]) on 12/29/2022 11:08:31 PM  Radiology No results found.  Procedures Procedures  Headache monitor shows atrial fibrillation with rapid ventricular response, per my interpretation.  Medications Ordered in ED Medications  metoprolol tartrate (LOPRESSOR) injection 2.5 mg (2.5 mg Intravenous Given 12/29/22 2311)    ED Course/ Medical Decision Making/ A&P   {   Click here for ABCD2, HEART and other calculatorsREFRESH Note before signing :1}      CHA2DS2-VASc Score: 1  Medical Decision Making Amount and/or Complexity of Data Reviewed Labs: ordered. Radiology: ordered.  Risk Prescription drug management.   Altered mental status in patient with history of alcohol abuse.  It is possible he is merely drunk, but need to rule out other causes such as occult infection, electrolyte disturbance, hepatic encephalopathy.  I have reviewed his electrocardiogram  and my interpretation is atrial fibrillation with rapid ventricular response and left axis deviation with rhythm having changed from sinus rhythm on 02/26/2017.  New onset atrial fibrillation would put him at risk for stroke and that is also possible etiology of his mental status change, although there are no focal findings on exam.  He is not a candidate for cardioversion since I do not know when he was last in sinus rhythm.  Blood pressure is borderline so I do not feel he can tolerate calcium channel blockers at this point, I have ordered a dose of metoprolol for rate control.  I have ordered laboratory workup with CBC, comprehensive metabolic panel, lactic acid level, ethanol level, troponin, ammonia, urinalysis, urine drug screen.  I have ordered chest x-ray to evaluate for possible occult pneumonia, CT of head to look for possible occult head trauma or signs of recent stroke.  I will hold off initiating anticoagulation until CT of head is obtained.  CHA2DS2-VASc score is 1, which puts him at low to intermediate risk for stroke, but this score is artificially low because he has not been in the medical system recently.  I have reviewed his past records, and he had an ED visit on 11/07/2014 for alcohol abuse, ED visit on 02/26/2017 for altered mental status which proved to be from alcohol intoxication.  The visit in 2019 was similar to today and that he was found in a car with altered mentation.  {Document critical care time when appropriate:1} {Document review of labs and clinical decision tools ie heart score, Chads2Vasc2 etc:1}  {Document your independent review of radiology images, and any outside records:1} {Document your discussion with family members, caretakers, and with consultants:1} {Document social determinants of health affecting pt's care:1} {Document your decision making why or why not admission, treatments were needed:1} Final Clinical Impression(s) / ED Diagnoses Final diagnoses:  None     Rx / DC Orders ED Discharge Orders     None

## 2022-12-29 NOTE — ED Notes (Signed)
Sats 89% on RA, pt placed on [facility]

## 2022-12-30 ENCOUNTER — Inpatient Hospital Stay (HOSPITAL_COMMUNITY): Payer: Medicare Other

## 2022-12-30 ENCOUNTER — Other Ambulatory Visit (HOSPITAL_COMMUNITY): Payer: Self-pay | Admitting: *Deleted

## 2022-12-30 ENCOUNTER — Encounter (HOSPITAL_COMMUNITY): Payer: Self-pay | Admitting: Internal Medicine

## 2022-12-30 DIAGNOSIS — F17200 Nicotine dependence, unspecified, uncomplicated: Secondary | ICD-10-CM | POA: Diagnosis not present

## 2022-12-30 DIAGNOSIS — I5A Non-ischemic myocardial injury (non-traumatic): Secondary | ICD-10-CM | POA: Diagnosis present

## 2022-12-30 DIAGNOSIS — R7989 Other specified abnormal findings of blood chemistry: Secondary | ICD-10-CM | POA: Diagnosis not present

## 2022-12-30 DIAGNOSIS — E876 Hypokalemia: Secondary | ICD-10-CM | POA: Diagnosis present

## 2022-12-30 DIAGNOSIS — E860 Dehydration: Secondary | ICD-10-CM | POA: Diagnosis present

## 2022-12-30 DIAGNOSIS — M1000 Idiopathic gout, unspecified site: Secondary | ICD-10-CM | POA: Diagnosis not present

## 2022-12-30 DIAGNOSIS — A419 Sepsis, unspecified organism: Secondary | ICD-10-CM | POA: Diagnosis not present

## 2022-12-30 DIAGNOSIS — I4891 Unspecified atrial fibrillation: Secondary | ICD-10-CM | POA: Diagnosis not present

## 2022-12-30 DIAGNOSIS — D509 Iron deficiency anemia, unspecified: Secondary | ICD-10-CM | POA: Diagnosis present

## 2022-12-30 DIAGNOSIS — I5030 Unspecified diastolic (congestive) heart failure: Secondary | ICD-10-CM | POA: Diagnosis not present

## 2022-12-30 DIAGNOSIS — F10120 Alcohol abuse with intoxication, uncomplicated: Secondary | ICD-10-CM | POA: Diagnosis present

## 2022-12-30 DIAGNOSIS — F32A Depression, unspecified: Secondary | ICD-10-CM | POA: Diagnosis present

## 2022-12-30 DIAGNOSIS — Z66 Do not resuscitate: Secondary | ICD-10-CM | POA: Diagnosis not present

## 2022-12-30 DIAGNOSIS — Y636 Underdosing and nonadministration of necessary drug, medicament or biological substance: Secondary | ICD-10-CM | POA: Diagnosis present

## 2022-12-30 DIAGNOSIS — I340 Nonrheumatic mitral (valve) insufficiency: Secondary | ICD-10-CM | POA: Diagnosis present

## 2022-12-30 DIAGNOSIS — K2970 Gastritis, unspecified, without bleeding: Secondary | ICD-10-CM | POA: Diagnosis present

## 2022-12-30 DIAGNOSIS — Z1152 Encounter for screening for COVID-19: Secondary | ICD-10-CM | POA: Diagnosis not present

## 2022-12-30 DIAGNOSIS — Y908 Blood alcohol level of 240 mg/100 ml or more: Secondary | ICD-10-CM | POA: Diagnosis present

## 2022-12-30 DIAGNOSIS — I110 Hypertensive heart disease with heart failure: Secondary | ICD-10-CM | POA: Diagnosis present

## 2022-12-30 DIAGNOSIS — F1010 Alcohol abuse, uncomplicated: Secondary | ICD-10-CM | POA: Diagnosis not present

## 2022-12-30 DIAGNOSIS — E872 Acidosis, unspecified: Secondary | ICD-10-CM | POA: Diagnosis present

## 2022-12-30 DIAGNOSIS — J9601 Acute respiratory failure with hypoxia: Secondary | ICD-10-CM | POA: Diagnosis present

## 2022-12-30 DIAGNOSIS — F10139 Alcohol abuse with withdrawal, unspecified: Secondary | ICD-10-CM | POA: Diagnosis not present

## 2022-12-30 DIAGNOSIS — R188 Other ascites: Secondary | ICD-10-CM | POA: Diagnosis present

## 2022-12-30 DIAGNOSIS — D62 Acute posthemorrhagic anemia: Secondary | ICD-10-CM | POA: Diagnosis not present

## 2022-12-30 DIAGNOSIS — K760 Fatty (change of) liver, not elsewhere classified: Secondary | ICD-10-CM | POA: Diagnosis present

## 2022-12-30 DIAGNOSIS — I5031 Acute diastolic (congestive) heart failure: Secondary | ICD-10-CM | POA: Diagnosis not present

## 2022-12-30 DIAGNOSIS — E66811 Obesity, class 1: Secondary | ICD-10-CM | POA: Diagnosis present

## 2022-12-30 DIAGNOSIS — F10920 Alcohol use, unspecified with intoxication, uncomplicated: Secondary | ICD-10-CM | POA: Diagnosis not present

## 2022-12-30 DIAGNOSIS — Z7189 Other specified counseling: Secondary | ICD-10-CM | POA: Diagnosis not present

## 2022-12-30 DIAGNOSIS — N179 Acute kidney failure, unspecified: Secondary | ICD-10-CM | POA: Diagnosis present

## 2022-12-30 DIAGNOSIS — G9341 Metabolic encephalopathy: Secondary | ICD-10-CM | POA: Diagnosis present

## 2022-12-30 DIAGNOSIS — I4819 Other persistent atrial fibrillation: Secondary | ICD-10-CM | POA: Diagnosis not present

## 2022-12-30 DIAGNOSIS — F10929 Alcohol use, unspecified with intoxication, unspecified: Secondary | ICD-10-CM | POA: Diagnosis present

## 2022-12-30 DIAGNOSIS — I5033 Acute on chronic diastolic (congestive) heart failure: Secondary | ICD-10-CM | POA: Diagnosis present

## 2022-12-30 DIAGNOSIS — Z7901 Long term (current) use of anticoagulants: Secondary | ICD-10-CM | POA: Diagnosis not present

## 2022-12-30 DIAGNOSIS — J351 Hypertrophy of tonsils: Secondary | ICD-10-CM | POA: Diagnosis present

## 2022-12-30 DIAGNOSIS — I2489 Other forms of acute ischemic heart disease: Secondary | ICD-10-CM | POA: Diagnosis present

## 2022-12-30 DIAGNOSIS — K921 Melena: Secondary | ICD-10-CM | POA: Diagnosis not present

## 2022-12-30 DIAGNOSIS — Z515 Encounter for palliative care: Secondary | ICD-10-CM | POA: Diagnosis not present

## 2022-12-30 DIAGNOSIS — D649 Anemia, unspecified: Secondary | ICD-10-CM | POA: Diagnosis not present

## 2022-12-30 DIAGNOSIS — J690 Pneumonitis due to inhalation of food and vomit: Secondary | ICD-10-CM | POA: Diagnosis present

## 2022-12-30 DIAGNOSIS — K8020 Calculus of gallbladder without cholecystitis without obstruction: Secondary | ICD-10-CM | POA: Diagnosis present

## 2022-12-30 LAB — URINALYSIS, W/ REFLEX TO CULTURE (INFECTION SUSPECTED)
Bacteria, UA: NONE SEEN
Bilirubin Urine: NEGATIVE
Glucose, UA: NEGATIVE mg/dL
Hgb urine dipstick: NEGATIVE
Ketones, ur: NEGATIVE mg/dL
Leukocytes,Ua: NEGATIVE
Nitrite: NEGATIVE
Protein, ur: 100 mg/dL — AB
Specific Gravity, Urine: 1.025 (ref 1.005–1.030)
pH: 5 (ref 5.0–8.0)

## 2022-12-30 LAB — CBC WITH DIFFERENTIAL/PLATELET
Abs Immature Granulocytes: 0.01 10*3/uL (ref 0.00–0.07)
Basophils Absolute: 0.1 10*3/uL (ref 0.0–0.1)
Basophils Relative: 1 %
Eosinophils Absolute: 0 10*3/uL (ref 0.0–0.5)
Eosinophils Relative: 0 %
HCT: 34.3 % — ABNORMAL LOW (ref 39.0–52.0)
Hemoglobin: 11.5 g/dL — ABNORMAL LOW (ref 13.0–17.0)
Immature Granulocytes: 0 %
Lymphocytes Relative: 32 %
Lymphs Abs: 1.6 10*3/uL (ref 0.7–4.0)
MCH: 35.3 pg — ABNORMAL HIGH (ref 26.0–34.0)
MCHC: 33.5 g/dL (ref 30.0–36.0)
MCV: 105.2 fL — ABNORMAL HIGH (ref 80.0–100.0)
Monocytes Absolute: 0.3 10*3/uL (ref 0.1–1.0)
Monocytes Relative: 6 %
Neutro Abs: 3.1 10*3/uL (ref 1.7–7.7)
Neutrophils Relative %: 61 %
Platelets: 203 10*3/uL (ref 150–400)
RBC: 3.26 MIL/uL — ABNORMAL LOW (ref 4.22–5.81)
RDW: 19.2 % — ABNORMAL HIGH (ref 11.5–15.5)
WBC: 5 10*3/uL (ref 4.0–10.5)
nRBC: 0 % (ref 0.0–0.2)

## 2022-12-30 LAB — RAPID URINE DRUG SCREEN, HOSP PERFORMED
Amphetamines: NOT DETECTED
Barbiturates: NOT DETECTED
Benzodiazepines: NOT DETECTED
Cocaine: NOT DETECTED
Opiates: NOT DETECTED
Tetrahydrocannabinol: NOT DETECTED

## 2022-12-30 LAB — LIPID PANEL
Cholesterol: 147 mg/dL (ref 0–200)
HDL: 66 mg/dL (ref >40)
LDL Cholesterol: 69 mg/dL (ref 0–99)
Total CHOL/HDL Ratio: 2.2 {ratio}
Triglycerides: 59 mg/dL (ref <150)
VLDL: 12 mg/dL (ref 0–40)

## 2022-12-30 LAB — BRAIN NATRIURETIC PEPTIDE
B Natriuretic Peptide: 251 pg/mL — ABNORMAL HIGH (ref 0.0–100.0)
B Natriuretic Peptide: 217 pg/mL — ABNORMAL HIGH (ref 0.0–100.0)

## 2022-12-30 LAB — TSH: TSH: 2.768 u[IU]/mL (ref 0.350–4.500)

## 2022-12-30 LAB — LACTIC ACID, PLASMA
Lactic Acid, Venous: 2.1 mmol/L (ref 0.5–1.9)
Lactic Acid, Venous: 3.6 mmol/L (ref 0.5–1.9)
Lactic Acid, Venous: 3.6 mmol/L (ref 0.5–1.9)
Lactic Acid, Venous: 5 mmol/L (ref 0.5–1.9)

## 2022-12-30 LAB — CBC
HCT: 32.7 % — ABNORMAL LOW (ref 39.0–52.0)
Hemoglobin: 11 g/dL — ABNORMAL LOW (ref 13.0–17.0)
MCH: 35.5 pg — ABNORMAL HIGH (ref 26.0–34.0)
MCHC: 33.6 g/dL (ref 30.0–36.0)
MCV: 105.5 fL — ABNORMAL HIGH (ref 80.0–100.0)
Platelets: 189 10*3/uL (ref 150–400)
RBC: 3.1 MIL/uL — ABNORMAL LOW (ref 4.22–5.81)
RDW: 19.3 % — ABNORMAL HIGH (ref 11.5–15.5)
WBC: 5.8 10*3/uL (ref 4.0–10.5)
nRBC: 0 % (ref 0.0–0.2)

## 2022-12-30 LAB — COMPREHENSIVE METABOLIC PANEL
ALT: 10 U/L (ref 0–44)
AST: 22 U/L (ref 15–41)
Albumin: 3.1 g/dL — ABNORMAL LOW (ref 3.5–5.0)
Alkaline Phosphatase: 75 U/L (ref 38–126)
Anion gap: 18 — ABNORMAL HIGH (ref 5–15)
BUN: 15 mg/dL (ref 8–23)
CO2: 23 mmol/L (ref 22–32)
Calcium: 8.1 mg/dL — ABNORMAL LOW (ref 8.9–10.3)
Chloride: 102 mmol/L (ref 98–111)
Creatinine, Ser: 1.31 mg/dL — ABNORMAL HIGH (ref 0.61–1.24)
GFR, Estimated: >60 mL/min (ref >60)
Glucose, Bld: 94 mg/dL (ref 70–99)
Potassium: 2.7 mmol/L — CL (ref 3.5–5.1)
Sodium: 143 mmol/L (ref 135–145)
Total Bilirubin: 1.1 mg/dL (ref <1.2)
Total Protein: 7 g/dL (ref 6.5–8.1)

## 2022-12-30 LAB — CBG MONITORING, ED
Glucose-Capillary: 79 mg/dL (ref 70–99)
Glucose-Capillary: 85 mg/dL (ref 70–99)

## 2022-12-30 LAB — BASIC METABOLIC PANEL
Anion gap: 17 — ABNORMAL HIGH (ref 5–15)
BUN: 15 mg/dL (ref 8–23)
CO2: 25 mmol/L (ref 22–32)
Calcium: 7.9 mg/dL — ABNORMAL LOW (ref 8.9–10.3)
Chloride: 101 mmol/L (ref 98–111)
Creatinine, Ser: 1.14 mg/dL (ref 0.61–1.24)
GFR, Estimated: >60 mL/min (ref >60)
Glucose, Bld: 89 mg/dL (ref 70–99)
Potassium: 3.4 mmol/L — ABNORMAL LOW (ref 3.5–5.1)
Sodium: 143 mmol/L (ref 135–145)

## 2022-12-30 LAB — RESPIRATORY PANEL BY PCR

## 2022-12-30 LAB — BLOOD GAS, VENOUS
Acid-Base Excess: 0.8 mmol/L (ref 0.0–2.0)
Bicarbonate: 26 mmol/L (ref 20.0–28.0)
Drawn by: 6509
O2 Saturation: 96.7 %
Patient temperature: 36.4
pCO2, Ven: 42 mm[Hg] — ABNORMAL LOW (ref 44–60)
pH, Ven: 7.4 (ref 7.25–7.43)
pO2, Ven: 80 mm[Hg] — ABNORMAL HIGH (ref 32–45)

## 2022-12-30 LAB — PROTEIN / CREATININE RATIO, URINE
Creatinine, Urine: 347 mg/dL
Protein Creatinine Ratio: 0.29 mg/mg{creat} — ABNORMAL HIGH (ref 0.00–0.15)
Total Protein, Urine: 101 mg/dL

## 2022-12-30 LAB — AMMONIA: Ammonia: 31 umol/L (ref 9–35)

## 2022-12-30 LAB — ETHANOL: Alcohol, Ethyl (B): 434 mg/dL (ref <10)

## 2022-12-30 LAB — HIV ANTIBODY (ROUTINE TESTING W REFLEX): HIV Screen 4th Generation wRfx: NONREACTIVE

## 2022-12-30 LAB — TROPONIN I (HIGH SENSITIVITY)
Troponin I (High Sensitivity): 34 ng/L — ABNORMAL HIGH (ref <18)
Troponin I (High Sensitivity): 39 ng/L — ABNORMAL HIGH (ref <18)

## 2022-12-30 LAB — HEPARIN LEVEL (UNFRACTIONATED)
Heparin Unfractionated: 0.23 [IU]/mL — ABNORMAL LOW (ref 0.30–0.70)
Heparin Unfractionated: 0.26 [IU]/mL — ABNORMAL LOW (ref 0.30–0.70)

## 2022-12-30 LAB — MRSA NEXT GEN BY PCR, NASAL: MRSA by PCR Next Gen: NOT DETECTED

## 2022-12-30 LAB — MAGNESIUM
Magnesium: 1.9 mg/dL (ref 1.7–2.4)
Magnesium: 1.9 mg/dL (ref 1.7–2.4)

## 2022-12-30 LAB — PHOSPHORUS: Phosphorus: 3 mg/dL (ref 2.5–4.6)

## 2022-12-30 LAB — T4, FREE: Free T4: 1.02 ng/dL (ref 0.61–1.12)

## 2022-12-30 LAB — FOLATE: Folate: 3.6 ng/mL — ABNORMAL LOW (ref >5.9)

## 2022-12-30 LAB — PROCALCITONIN: Procalcitonin: <0.1 ng/mL

## 2022-12-30 LAB — VITAMIN B12: Vitamin B-12: 158 pg/mL — ABNORMAL LOW (ref 180–914)

## 2022-12-30 MED ORDER — ADULT MULTIVITAMIN W/MINERALS CH
1.0000 | ORAL_TABLET | Freq: Every day | ORAL | Status: DC
  Administered 2022-12-30 – 2023-01-16 (×18): 1 via ORAL
  Filled 2022-12-30 (×18): qty 1

## 2022-12-30 MED ORDER — SODIUM CHLORIDE 0.9% FLUSH: 10.0000 mL | INTRAVENOUS | Status: DC | PRN

## 2022-12-30 MED ORDER — CHLORDIAZEPOXIDE HCL 25 MG PO CAPS
25.0000 mg | ORAL_CAPSULE | Freq: Two times a day (BID) | ORAL | Status: AC
Start: 2023-01-02 — End: 2023-01-02
  Administered 2023-01-02 (×2): 25 mg via ORAL
  Filled 2022-12-30 (×2): qty 1

## 2022-12-30 MED ORDER — HEPARIN (PORCINE) 25000 UT/250ML-% IV SOLN
1750.0000 [IU]/h | INTRAVENOUS | Status: DC
  Administered 2022-12-30: 1350 [IU]/h via INTRAVENOUS
  Administered 2022-12-30: 1550 [IU]/h via INTRAVENOUS
  Administered 2022-12-31: 1750 [IU]/h via INTRAVENOUS
  Filled 2022-12-30 (×3): qty 250

## 2022-12-30 MED ORDER — VITAMIN B-12 1000 MCG PO TABS
1000.0000 ug | ORAL_TABLET | Freq: Every day | ORAL | Status: AC
Start: 2022-12-31 — End: ?
  Administered 2022-12-31 – 2023-01-16 (×17): 1000 ug via ORAL
  Filled 2022-12-30 (×17): qty 1

## 2022-12-30 MED ORDER — SODIUM CHLORIDE 0.9 % IV SOLN
2.0000 g | INTRAVENOUS | Status: AC
  Administered 2022-12-31 – 2023-01-01 (×3): 2 g via INTRAVENOUS
  Filled 2022-12-30 (×3): qty 20

## 2022-12-30 MED ORDER — CHLORHEXIDINE GLUCONATE CLOTH 2 % EX PADS
6.0000 | MEDICATED_PAD | Freq: Every day | CUTANEOUS | Status: DC
  Administered 2022-12-30 – 2023-01-07 (×9): 6 via TOPICAL

## 2022-12-30 MED ORDER — FUROSEMIDE 10 MG/ML IJ SOLN
40.0000 mg | Freq: Once | INTRAMUSCULAR | Status: AC
  Administered 2022-12-30: 40 mg via INTRAVENOUS
  Filled 2022-12-30: qty 4

## 2022-12-30 MED ORDER — POTASSIUM CHLORIDE CRYS ER 20 MEQ PO TBCR
40.0000 meq | EXTENDED_RELEASE_TABLET | Freq: Once | ORAL | Status: AC
  Administered 2022-12-30: 40 meq via ORAL
  Filled 2022-12-30: qty 2

## 2022-12-30 MED ORDER — SODIUM CHLORIDE 0.9% FLUSH
3.0000 mL | Freq: Two times a day (BID) | INTRAVENOUS | Status: DC
  Administered 2022-12-30 – 2023-01-15 (×29): 3 mL via INTRAVENOUS

## 2022-12-30 MED ORDER — LORAZEPAM 1 MG PO TABS
1.0000 mg | ORAL_TABLET | ORAL | Status: AC | PRN
  Administered 2022-12-30 – 2022-12-31 (×6): 1 mg via ORAL
  Filled 2022-12-30 (×7): qty 1

## 2022-12-30 MED ORDER — LACTATED RINGERS IV BOLUS (SEPSIS)
1000.0000 mL | Freq: Once | INTRAVENOUS | Status: AC
Start: 2022-12-30 — End: 2022-12-30
  Administered 2022-12-30: 1000 mL via INTRAVENOUS

## 2022-12-30 MED ORDER — CYANOCOBALAMIN 1000 MCG/ML IJ SOLN
1000.0000 ug | Freq: Once | INTRAMUSCULAR | Status: AC
  Administered 2022-12-30: 1000 ug via INTRAMUSCULAR
  Filled 2022-12-30: qty 1

## 2022-12-30 MED ORDER — HEPARIN BOLUS VIA INFUSION
1500.0000 [IU] | Freq: Once | INTRAVENOUS | Status: AC
  Administered 2022-12-30: 1500 [IU] via INTRAVENOUS
  Filled 2022-12-30: qty 1500

## 2022-12-30 MED ORDER — METOPROLOL TARTRATE 5 MG/5ML IV SOLN
2.5000 mg | Freq: Four times a day (QID) | INTRAVENOUS | Status: DC | PRN
  Administered 2022-12-30 – 2022-12-31 (×3): 2.5 mg via INTRAVENOUS
  Filled 2022-12-30 (×3): qty 5

## 2022-12-30 MED ORDER — MAGNESIUM SULFATE 2 GM/50ML IV SOLN
2.0000 g | Freq: Once | INTRAVENOUS | Status: AC
  Administered 2022-12-30: 2 g via INTRAVENOUS
  Filled 2022-12-30: qty 50

## 2022-12-30 MED ORDER — CHLORDIAZEPOXIDE HCL 25 MG PO CAPS
50.0000 mg | ORAL_CAPSULE | Freq: Four times a day (QID) | ORAL | Status: AC
  Administered 2022-12-30 – 2022-12-31 (×4): 50 mg via ORAL
  Filled 2022-12-30 (×4): qty 2

## 2022-12-30 MED ORDER — SODIUM CHLORIDE 0.9% FLUSH
10.0000 mL | Freq: Two times a day (BID) | INTRAVENOUS | Status: DC
  Administered 2022-12-30 – 2023-01-05 (×13): 10 mL
  Administered 2023-01-06: 20 mL
  Administered 2023-01-06 – 2023-01-16 (×20): 10 mL

## 2022-12-30 MED ORDER — LORAZEPAM 2 MG/ML IJ SOLN: 1.0000 mg | INTRAMUSCULAR | Status: AC | PRN

## 2022-12-30 MED ORDER — FOLIC ACID 1 MG PO TABS
1.0000 mg | ORAL_TABLET | Freq: Every day | ORAL | Status: DC
  Administered 2022-12-30 – 2023-01-16 (×18): 1 mg via ORAL
  Filled 2022-12-30 (×22): qty 1

## 2022-12-30 MED ORDER — HEPARIN BOLUS VIA INFUSION
5500.0000 [IU] | Freq: Once | INTRAVENOUS | Status: AC
  Administered 2022-12-30: 5500 [IU] via INTRAVENOUS

## 2022-12-30 MED ORDER — CHLORDIAZEPOXIDE HCL 25 MG PO CAPS
25.0000 mg | ORAL_CAPSULE | Freq: Once | ORAL | Status: AC
Start: 2023-01-03 — End: 2023-01-03
  Administered 2023-01-03: 25 mg via ORAL
  Filled 2022-12-30: qty 1

## 2022-12-30 MED ORDER — AZITHROMYCIN 250 MG PO TABS
500.0000 mg | ORAL_TABLET | Freq: Every day | ORAL | Status: AC
  Administered 2022-12-30 – 2022-12-31 (×2): 500 mg via ORAL
  Filled 2022-12-30 (×2): qty 2

## 2022-12-30 MED ORDER — POTASSIUM CHLORIDE 10 MEQ/100ML IV SOLN
10.0000 meq | INTRAVENOUS | Status: AC
  Administered 2022-12-30 (×3): 10 meq via INTRAVENOUS
  Filled 2022-12-30 (×4): qty 100

## 2022-12-30 MED ORDER — LACTATED RINGERS IV BOLUS (SEPSIS)
1000.0000 mL | Freq: Once | INTRAVENOUS | Status: AC
  Administered 2022-12-30: 1000 mL via INTRAVENOUS

## 2022-12-30 MED ORDER — DILTIAZEM HCL-DEXTROSE 125-5 MG/125ML-% IV SOLN (PREMIX)
INTRAVENOUS | Status: AC
  Administered 2022-12-30: 5 mg/h via INTRAVENOUS
  Filled 2022-12-30: qty 125

## 2022-12-30 MED ORDER — LACTATED RINGERS IV BOLUS (SEPSIS): 1000.0000 mL | Freq: Once | INTRAVENOUS | Status: DC

## 2022-12-30 MED ORDER — THIAMINE HCL 100 MG/ML IJ SOLN
500.0000 mg | Freq: Three times a day (TID) | INTRAVENOUS | Status: DC
  Administered 2022-12-30 – 2023-01-06 (×20): 500 mg via INTRAVENOUS
  Filled 2022-12-30 (×27): qty 5

## 2022-12-30 MED ORDER — CHLORDIAZEPOXIDE HCL 25 MG PO CAPS
50.0000 mg | ORAL_CAPSULE | Freq: Three times a day (TID) | ORAL | Status: AC
Start: 2022-12-31 — End: 2023-01-01
  Administered 2022-12-31 (×3): 50 mg via ORAL
  Filled 2022-12-30 (×3): qty 2

## 2022-12-30 MED ORDER — POTASSIUM CHLORIDE 10 MEQ/100ML IV SOLN
INTRAVENOUS | Status: AC
  Administered 2022-12-30: 10 meq via INTRAVENOUS
  Filled 2022-12-30: qty 100

## 2022-12-30 MED ORDER — METOPROLOL TARTRATE 5 MG/5ML IV SOLN: 2.5000 mg | Freq: Four times a day (QID) | INTRAVENOUS | Status: DC | PRN

## 2022-12-30 MED ORDER — CHLORDIAZEPOXIDE HCL 25 MG PO CAPS
50.0000 mg | ORAL_CAPSULE | Freq: Two times a day (BID) | ORAL | Status: AC
Start: 2023-01-01 — End: 2023-01-01
  Administered 2023-01-01 (×2): 50 mg via ORAL
  Filled 2022-12-30 (×2): qty 2

## 2022-12-30 MED ORDER — ALBUMIN HUMAN 25 % IV SOLN
25.0000 g | Freq: Once | INTRAVENOUS | Status: AC
  Administered 2022-12-30: 25 g via INTRAVENOUS
  Filled 2022-12-30: qty 100

## 2022-12-30 MED ORDER — MAGNESIUM SULFATE 2 GM/50ML IV SOLN: 2.0000 g | Freq: Once | INTRAVENOUS | Status: DC

## 2022-12-30 MED ORDER — DILTIAZEM HCL-DEXTROSE 125-5 MG/125ML-% IV SOLN (PREMIX)
5.0000 mg/h | INTRAVENOUS | Status: DC
  Administered 2022-12-30 – 2022-12-31 (×2): 15 mg/h via INTRAVENOUS
  Filled 2022-12-30 (×2): qty 125

## 2022-12-30 NOTE — ED Notes (Signed)
Date and time results received: 12/30/22 0046   Test: ETOH Critical Value: 434  Name of Provider Notified: Preston Fleeting, MD

## 2022-12-30 NOTE — Sepsis Progress Note (Signed)
Notified provider of need to order repeat lactic acid. ° °
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
## 2022-12-30 NOTE — ED Notes (Signed)
Date and time results received: 12/30/22 0046   Test: K+ Critical Value: 2.7  Name of Provider Notified: Preston Fleeting, MD

## 2022-12-30 NOTE — Progress Notes (Signed)
PROGRESS NOTE    Patient: Justin Fleming                            PCP: Patient, No Pcp Per                    DOB: [birth year]            DOA: 12/29/2022 [id number]             DOS: 12/30/2022, 12:41 PM   LOS: 0 days   Date of Service: The patient was seen and examined on 12/30/2022  Subjective:   The patient was seen and examined this morning. Hemodynamically stable. No issues overnight .  Brief Narrative:   History limited due to patient's intoxication/AMS, obtained by patient, chart review, EDP Ishaq Batis Mcewen is a 62 y.o. male with hx of alcohol abuse, hypertension, mood disorder, smoking, GERD, who was brought in after bystander contacted EMS because he was sitting in his car in the parking lot.   Found have alcohol intoxication, A-fib with RVR, suspected aspiration pneumonia, and worsening lactic acidosis with fluids.  Clinically suspect decompensated heart failure.  Denies any known history of A-fib.   EKG:  A-fib with RVR rate 159, LAD, LAFB, inferior Q's, poor R wave progression, T wave inversion laterally, no overt ischemic changes.    ED Course:  Found to be in A-fib with RVR, initially treated with metoprolol 2.5 mg IV x 1 with slight worsening of pressures to systolic in the 90s.  Started on heparin for anticoagulation.  Treated with ceftriaxone/azithromycin, 2 L IV fluid, potassium 40 mg IV and mag 4 g.  At my request consulted with cardiology fellow re: rate control options.  Cards recommending possibly metoprolol IV prn or digoxin, recommend against amiodarone.     Assessment/Plan:  Alcohol intoxication A-fib with RVR Sepsis      Alcohol intoxication, at risk for severe alcohol withdrawal -Monitoring withdrawal symptoms closely Reports drinking up to 1.75 L of liquor a day, no known history of severe withdrawal in the past /seizure/DT.  -CIWA with Ativan initiated Librium  -High-dose thiamine per below, multivitamin, folate -TOC for substance  abuse services   A-fib with RVR -New onset likely due to alcohol intoxication -Presented with rates up to the 160s.   -CHA2DS2-VASc is at least 1-2 (HTN + suspected HF) and may be higher. Started on [facility] in the ED. on admission was initiated on as needed metoprolol 2.5 mg IV.   -Initiating Cardizem drip (with a goal of heart rate less than 100) -Monitoring blood pressure, if drops or remains soft, will switch to amiodarone -Continue heparin drip   -EDP discussed case with cardiology recommended considering digoxin or additional metoprolol.    -TSH -normal at 2.7   Suspect acute decompensated heart failure Clinically with significant edema, JVD.  BNP elevated to 251.   Lactate worsening after IV fluids. -Stop IV fluid resuscitation.  Replete potassium, and start diuresis with Lasix 40 mg IV  -TTE -Strict I's and O's, daily weights    SIRS/possible sepsis - Borderline hypotensive Lactic acidosis -improving -Ruling out sepsis - Lactate worsening 3.6 -> 5 >>> 3.6  -  possible right base pneumonia on chest x-ray.   - A-fib may be driven by heart failure, alcohol, or infection.  And respiratory rate also may be driven by same so unclear if truly sepsis. - Antibiotic management per aspiration pneumonia - Status post  2 L IV fluid -  - Monitoring closely   Possible aspiration pneumonia Chest x-ray with right pleural effusion and associated right base atelectasis versus infiltrate.  Infection versus effusion and atelectasis. -Continue ceftriaxone, azithromycin for coverage for CAP -Check RVP, sputum culture,  -Procalcitonin < 0.10  - Avoid nebs for now considering tachyarrhythmia. - Incentive spirometry, flutter valve   Acute myocardial injury -Ischemic demand due to A-fib with RVR -Troponin 39 >> 34, -  EKG is A-fib with RVR, nonspecific changes without overt ischemic changes. -Management of alcohol withdrawal, A-fib RVR, heart failure, borderline hypotension, aspiration pneumonia  per above.   Encephalopathy  -Somnolent, more awake, following commands -initial ETOH level was 434. CT Head with mild diffuse atrophy, no acute abnormalities.    - Management of alcohol intoxication/withdrawal per above.  - Urine tox, UA -negative - TSH 2.7  - Folate 3.6, B12 -158,  - Check HIV, RPR   Suspected AKI stage I Baseline creatinine unknown remote in the past was around 1.  Elevated to 1.3 on admission.  Suspect cardiorenal - Status post IV fluid per above.  Switching to diuresis with concern for heart failure. - Check PVR   Hypokalemia / Hypomagnesemia - Repleted, monitoring   Macrocytic anemia Possibly vitamin deficiency versus alcohol - Folate, B12 - low -repleting   Periorbital edema - TSH within normal limits    History of hypertension: No recent fill of antihypertensives. Currently borderline hypotensive per above  Mood d/o  GERD -continue PPI        ----------------------------------------------------------------------------------------------------------------------------------------------- Nutritional status:  The patient's BMI is: Body mass index is 30.75 kg/m. I agree with the assessment and plan as outlined below: Nutrition Status:      ---------------------------------------------------------------------------------------------------------------------------------------- Cultures; Blood Cultures x 2 >> Urine Culture  >>>  Sputum Culture >> attempting to obtain Pending upper respiratory viral panel   ------------------------------------------------------------------------------------------------------------------------------------------------  DVT prophylaxis:     Code Status:   Code Status: Full Code  Family Communication: No family member present at bedside- Admission status:   Status is: Inpatient Remains inpatient appropriate because: A-fib with RVR, IV Cardizem, 2 L of oxygen, treated for sepsis, lactic  acidosis   Disposition: From  - home             Planning for discharge in 2-3 days: to Home w [facility]   Procedures:   No admission procedures for hospital encounter.   Antimicrobials:  Anti-infectives (From admission, onward)    Start     Dose/Rate Route Frequency Ordered Stop   12/31/22 0000  cefTRIAXone (ROCEPHIN) 2 g in sodium chloride 0.9 % 100 mL IVPB        2 g 200 mL/hr over 30 Minutes Intravenous Every 24 hours 12/30/22 0443 01/03/23 2359   12/30/22 2200  azithromycin (ZITHROMAX) tablet 500 mg        500 mg Oral Daily at bedtime 12/30/22 0443 01/01/23 2159   12/30/22 0000  cefTRIAXone (ROCEPHIN) 2 g in sodium chloride 0.9 % 100 mL IVPB        2 g 200 mL/hr over 30 Minutes Intravenous  Once 12/29/22 2345 12/30/22 0100   12/30/22 0000  azithromycin (ZITHROMAX) 500 mg in sodium chloride 0.9 % 250 mL IVPB        500 mg 250 mL/hr over 60 Minutes Intravenous  Once 12/29/22 2345 12/30/22 0158        Medication:   azithromycin  500 mg Oral QHS   chlordiazePOXIDE  50 mg Oral Q6H  Followed by   Melene Muller ON 12/31/2022] chlordiazePOXIDE  50 mg Oral Q8H   Followed by   Melene Muller ON 01/01/2023] chlordiazePOXIDE  50 mg Oral Q12H   Followed by   Melene Muller ON 01/02/2023] chlordiazePOXIDE  25 mg Oral Q12H   Followed by   Melene Muller ON 01/03/2023] chlordiazePOXIDE  25 mg Oral Once   Chlorhexidine Gluconate Cloth  6 each Topical Q0600   [START ON 12/31/2022] vitamin B-12  1,000 mcg Oral Daily   folic acid  1 mg Oral Daily   multivitamin with minerals  1 tablet Oral Daily   sodium chloride flush  3 mL Intravenous Q12H    LORazepam **OR** LORazepam, metoprolol tartrate   Objective:   Vitals:   12/30/22 1100 12/30/22 1130 12/30/22 1200 12/30/22 1230  BP: (!) 109/93 96/74 (!) 131/91 (!) 86/54  Pulse:      Resp: 17 12 17 13   Temp:   97.9 F (36.6 C)   TempSrc:   Oral   SpO2:    93%  Weight:      Height:        Intake/Output Summary (Last 24 hours) at 12/30/2022 1241 Last data  filed at 12/30/2022 1228 Gross per 24 hour  Intake 3989.03 ml  Output 1125 ml  Net 2864.03 ml   Filed Weights   12/30/22 0006 12/30/22 0859  Weight: 98.2 kg 97.2 kg     Physical examination:   Constitution: Somnolent but easily arousable, following command Psychiatric:   Normal and stable mood and affect, cognition intact,   HEENT:        Bilateral exophthalmos -normocephalic, PERRL, otherwise with in Normal limits  Chest:         Chest symmetric Cardio vascular:  S1/S2, irregularly irregular, No murmure, No Rubs or Gallops  pulmonary: Clear to auscultation bilaterally, respirations unlabored, negative wheezes / crackles Abdomen: Soft, non-tender, non-distended, bowel sounds,no masses, no organomegaly Muscular skeletal: Limited exam - in bed, able to move all 4 extremities,   Neuro: CNII-XII intact. , normal motor and sensation, reflexes intact  Extremities: No pitting edema lower extremities, +2 pulses  Skin: Dry, warm to touch, negative for any Rashes, No open wounds Wounds: per nursing documentation   ------------------------------------------------------------------------------------------------------------------------------------------    LABs:     Latest Ref Rng & Units 12/30/2022    4:25 AM 12/30/2022   12:01 AM 02/26/2017    9:58 PM  CBC  WBC 4.0 - 10.5 K/uL 5.8  5.0  8.1   Hemoglobin 13.0 - 17.0 g/dL 16.1  09.6  04.5   Hematocrit 39.0 - 52.0 % 32.7  34.3  43.8   Platelets 150 - 400 K/uL 189  203  351       Latest Ref Rng & Units 12/30/2022    4:25 AM 12/30/2022   12:01 AM 02/02/2018    9:27 AM  CMP  Glucose 70 - 99 mg/dL 89  94  90   BUN 8 - 23 mg/dL 15  15  13    Creatinine 0.61 - 1.24 mg/dL 4.09  8.11  9.14   Sodium 135 - 145 mmol/L 143  143  138   Potassium 3.5 - 5.1 mmol/L 3.4  2.7  4.3   Chloride 98 - 111 mmol/L 101  102  98   CO2 22 - 32 mmol/L 25  23  26    Calcium 8.9 - 10.3 mg/dL 7.9  8.1  78.2   Total Protein 6.5 - 8.1 g/dL  7.0  7.8   Total  Bilirubin <1.2 mg/dL  1.1  1.1   Alkaline Phos 38 - 126 U/L  75    AST 15 - 41 U/L  22  10   ALT 0 - 44 U/L  10  5        Micro Results Recent Results (from the past 240 hours)  Culture, blood (routine x 2)     Status: None (Preliminary result)   Collection Time: 12/30/22 12:51 AM   Specimen: BLOOD  Result Value Ref Range Status   Specimen Description BLOOD BLOOD RIGHT ARM  Final   Special Requests   Final    BOTTLES DRAWN AEROBIC ONLY Blood Culture results may not be optimal due to an inadequate volume of blood received in culture bottles   Culture   Final    NO GROWTH < 12 HOURS Performed at [facility], [address] [place], [place] [address]    Report Status PENDING  Incomplete  Culture, blood (routine x 2)     Status: None (Preliminary result)   Collection Time: 12/30/22 12:55 AM   Specimen: BLOOD LEFT FOREARM  Result Value Ref Range Status   Specimen Description BLOOD LEFT FOREARM  Final   Special Requests   Final    BOTTLES DRAWN AEROBIC AND ANAEROBIC Blood Culture results may not be optimal due to an inadequate volume of blood received in culture bottles   Culture   Final    NO GROWTH < 12 HOURS Performed at [facility], [address], [place], [place] [address]    Report Status PENDING  Incomplete    Radiology Reports DG Chest Port 1 View Result Date: 12/29/2022 CLINICAL DATA:  Altered mental status. EXAM: PORTABLE CHEST 1 VIEW COMPARISON:  11/05/2014 FINDINGS: Extremely shallow inspiration. Heart size and pulmonary vascularity are probably normal for technique. Suggestion of a right pleural effusion with basilar atelectasis or infiltration. No visible pneumothorax. IMPRESSION: Shallow inspiration. Right pleural effusion with basilar atelectasis or infiltration. Electronically Signed   By: Burman Nieves M.D.   On: 12/29/2022 23:33   CT Head Wo Contrast Result Date: 12/29/2022 CLINICAL DATA:  Mental status change, unknown cause EXAM: CT HEAD WITHOUT CONTRAST  TECHNIQUE: Contiguous axial images were obtained from the base of the skull through the vertex without intravenous contrast. RADIATION DOSE REDUCTION: This exam was performed according to the departmental dose-optimization program which includes automated exposure control, adjustment of the mA and/or kV according to patient size and/or use of iterative reconstruction technique. COMPARISON:  None Available. FINDINGS: Brain: Mild diffuse cerebral atrophy. No acute intracranial abnormality. Specifically, no hemorrhage, hydrocephalus, mass lesion, acute infarction, or significant intracranial injury. Vascular: No hyperdense vessel or unexpected calcification. Skull: No acute calvarial abnormality. Sinuses/Orbits: No acute findings Other: None IMPRESSION: No acute intracranial abnormality. Mild atrophy. Electronically Signed   By: Charlett Nose M.D.   On: 12/29/2022 23:28    SIGNED: Kendell Bane, MD, FHM. FAAFP. [facility] - [facility] Critical care time spent - 55 min.  In seeing, evaluating and examining the patient. Reviewing medical records, labs, drawn plan of care. [facility],  Pager (please use [email or web address] to page/ text) Please use Epic Secure Chat for non-urgent communication (7AM-7PM)  If 7PM-7AM, please contact night-coverage [email or web address], 12/30/2022, 12:41 PM

## 2022-12-30 NOTE — Sepsis Progress Note (Addendum)
Elink monitoring for the code sepsis protocol.  Antibiotics started before code sepsis ordered, before blood cultures collected.

## 2022-12-30 NOTE — Progress Notes (Signed)
PHARMACY - ANTICOAGULATION CONSULT NOTE  Pharmacy Consult for heparin Indication: atrial fibrillation  No Known Allergies  Patient Measurements: Height: 5\' 10"  (177.8 cm) Weight: 97.2 kg (214 lb 4.6 oz) IBW/kg (Calculated) : 73 Heparin Dosing Weight: 93 kg  Vital Signs: Temp: 98.3 F (36.8 C) (12/25 1646) Temp Source: Oral (12/25 1646) BP: 107/73 (12/25 1800) Pulse Rate: 99 (12/25 1800)  Labs: Recent Labs    12/30/22 0001 12/30/22 0239 12/30/22 0425 12/30/22 0911 12/30/22 1641  HGB 11.5*  --  11.0*  --   --   HCT 34.3*  --  32.7*  --   --   PLT 203  --  189  --   --   HEPARINUNFRC  --   --   --  0.23* 0.26*  CREATININE 1.31*  --  1.14  --   --   TROPONINIHS 39* 34*  --   --   --     Estimated Creatinine Clearance: 76.6 mL/min (by C-G formula based on SCr of 1.14 mg/dL).  Assessment: 32 yoM presented to the ED with altered mental status after being found down in car. Patient found to have atrial fibrillation w/ RVR. Pharmacy consulkted to dose for new onset atrial fibrillation.  -Hgb 11.5, plts 203 -no PTA anticoagulation reported -CHADS-VASc: 1 (but given out of system for few years, may be higher)  HL 0.23, subtherapeutic. No issues with infusion per RN HL 0.26, subtherapeutic  Goal of Therapy:  Heparin level 0.3-0.7 units/ml Monitor platelets by anticoagulation protocol: Yes   Plan:  Give 1500 units bolus x 1 Increase heparin infusion to 1750 units/hr Check anti-Xa level in 6-8 hours and daily while on heparin Continue to monitor H&H and platelets  Will M. Dareen Piano, PharmD Clinical Pharmacist 12/30/2022 6:27 PM

## 2022-12-30 NOTE — ED Notes (Signed)
Pt pulling at lines and monitoring wires. Mitts applied to hands at this time

## 2022-12-30 NOTE — ED Notes (Signed)
Pt asked if he will be discharged today. Pt informed he is being admitted and explained condition. Pt verbalized understanding.

## 2022-12-30 NOTE — ED Notes (Signed)
Pt's sister took pt's clothes home to be washed

## 2022-12-30 NOTE — Progress Notes (Addendum)
Peripherally Inserted Central Catheter Placement  The IV Nurse has discussed with the patient and/or persons authorized to consent for the patient, the purpose of this procedure and the potential benefits and risks involved with this procedure.  The benefits include less needle sticks, lab draws from the catheter, and the patient may be discharged home with the catheter. Risks include, but not limited to, infection, bleeding, blood clot (thrombus formation), and puncture of an artery; nerve damage and irregular heartbeat and possibility to perform a PICC exchange if needed/ordered by physician.  Alternatives to this procedure were also discussed.  Bard Power PICC patient education guide, fact sheet on infection prevention and patient information card has been provided to patient /or left at bedside. Dr. Flossie Dibble aware blood cultures pending, and currently no plans for ID consult.   PICC Placement Documentation  PICC Double Lumen 12/30/22 Right Basilic 42 cm 0 cm (Active)  Indication for Insertion or Continuance of Line Vasoactive infusions 12/30/22 1546  Exposed Catheter (cm) 0 cm 12/30/22 1546  Site Assessment Clean, Dry, Intact 12/30/22 1546  Lumen #1 Status Flushed;Saline locked;Blood return noted 12/30/22 1546  Lumen #2 Status Flushed;Saline locked;Blood return noted 12/30/22 1546  Dressing Type Transparent;Securing device 12/30/22 1546  Dressing Status Antimicrobial disc in place;Clean, Dry, Intact 12/30/22 1546  Line Care Connections checked and tightened 12/30/22 1546  Line Adjustment (NICU/IV Team Only) No 12/30/22 1546  Dressing Intervention New dressing;Adhesive placed at insertion site (IV team only) 12/30/22 1546  Dressing Change Due 01/06/23 12/30/22 1546       Vernona Rieger  Edwards Mckelvie 12/30/2022, 3:47 PM

## 2022-12-30 NOTE — ED Notes (Signed)
Date and time results received: 12/30/22 0046   Test: LACTIC Critical Value: 3.6  Name of Provider Notified: Preston Fleeting, MD
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
## 2022-12-30 NOTE — Hospital Course (Addendum)
History limited due to patient's intoxication/AMS, obtained by patient, chart review, EDP Lam Justin Fleming is a 62 y.o. male with hx of alcohol abuse, hypertension, mood disorder, smoking, GERD, who was brought in after bystander contacted EMS because he was sitting in his car in the parking lot.   Found have alcohol intoxication, A-fib with RVR, suspected aspiration pneumonia, and worsening lactic acidosis with fluids.  Clinically suspect decompensated heart failure.  Denies any known history of A-fib.   EKG:  A-fib with RVR rate 159, LAD, LAFB, inferior Q's, poor R wave progression, T wave inversion laterally, no overt ischemic changes.    ED Course:  Found to be in A-fib with RVR, initially treated with metoprolol 2.5 mg IV x 1 with slight worsening of pressures to systolic in the 90s.  Started on heparin for anticoagulation.  Treated with ceftriaxone/azithromycin, 2 L IV fluid, potassium 40 mg IV and mag 4 g.  At my request consulted with cardiology fellow re: rate control options.  Cards recommending possibly metoprolol IV prn or digoxin, recommend against amiodarone.     Assessment/Plan:  Alcohol intoxication A-fib with RVR Sepsis  Alcohol intoxication, at risk for severe alcohol withdrawal -Continue to monitor for withdrawal symptoms Still tachycardic, in A-fib mildly hypotensive Mentation back to baseline  Reports drinking up to 1.75 L of liquor a day, no known history of severe withdrawal in the past /seizure/DT.  -CIWA with Ativan initiated Librium  -High-dose thiamine per below, multivitamin, folate -TOC for substance abuse services   A-fib with RVR -new onset -New onset likely due to alcohol intoxication -Heart rate improved on Cardizem drip -CHA2DS2-VASc is at least 1-2 (HTN + suspected HF) and may be higher. Started on [facility] in the ED. on admission was initiated on as needed metoprolol 2.5 mg IV.    -Monitoring blood pressure, if drops or remains soft, will switch to  amiodarone -Continue heparin drip -Cardiologist Dr. Diona Browner consulted, recommended continue heparin drip, adding metoprolol   -TSH -normal at 2.7   Suspect acute decompensated heart failure -Ruling out cardiomyopathy in the setting of current alcohol use Clinically with significant edema, JVD.  BNP elevated to 251.   Lactate worsening after IV fluids. -Stop IV fluid resuscitation.  Replete potassium, and start diuresis with Lasix 40 mg IV  -TTE-pending -Strict I's and O's, daily weights  -IV fluids initially initiated for hypotension, ruling out sepsis, subsequently will discontinue   SIRS/possible sepsis - Borderline hypotensive Lactic acidosis -improving -Sepsis ruled out-source of infection possible right lower lobe pneumonia on x-ray - Lactate worsening 3.6 -> 5 >>> 3.6 >> 2.1 -  possible right base pneumonia on chest x-ray.   - A-fib may be driven by heart failure, alcohol, or infection.  And respiratory rate also may be driven by same so unclear if truly sepsis. - Continue  Biotics - Status post 2 L IV fluid -  - Monitoring closely   Possible aspiration pneumonia Chest x-ray with right pleural effusion and associated right base atelectasis versus infiltrate.  Infection versus effusion and atelectasis. -Continue ceftriaxone, azithromycin for coverage for CAP -Check RVP, sputum culture,  -Procalcitonin < 0.10  - Avoid nebs for now considering tachyarrhythmia. - Incentive spirometry, flutter valve   Acute myocardial injury -Ischemic demand due to A-fib with RVR -Troponin 39 >> 34, -  EKG is A-fib with RVR, nonspecific changes without overt ischemic changes. -Management of alcohol withdrawal, A-fib RVR, heart failure, borderline hypotension, aspiration pneumonia per above. -Pending echo -Appreciate cardiology recommendations  Encephalopathy  -Much improved mentation, back to baseline -initial ETOH level was 434. CT Head with mild diffuse atrophy, no acute  abnormalities.    - Urine tox, UA -negative - TSH 2.7  - Folate 3.6, B12 -158,  - HIV, RPR>> nonreactive   Suspected AKI stage I Baseline creatinine unknown remote in the past was around 1.  Elevated to 1.3 on admission.  Suspect cardiorenal - Status post IV fluid per above.  Switching to diuresis with concern for heart failure. - Check PVR   Hypokalemia / Hypomagnesemia - Repleted, monitoring   Macrocytic anemia Possibly vitamin deficiency versus alcohol - Folate, B12 - low -repleting -Mild drop in hemoglobin 11 >> 9.0 (possibly delusional effect, from IV fluids, no signs of bleeding) -Obtaining Hemoccult   Periorbital edema - TSH within normal limits    History of hypertension: No recent fill of antihypertensives. Currently borderline hypotensive per above   Mood d/o- stable, not on any medications   GERD -continue PPI

## 2022-12-30 NOTE — Progress Notes (Addendum)
PHARMACY - ANTICOAGULATION CONSULT NOTE  Pharmacy Consult for heparin Indication: atrial fibrillation  No Known Allergies  Patient Measurements: Height: 5\' 10"  (177.8 cm) Weight: 97.2 kg (214 lb 4.6 oz) IBW/kg (Calculated) : 73 Heparin Dosing Weight: 93 kg  Vital Signs: Temp: 97.7 F (36.5 C) (12/25 0859) Temp Source: Oral (12/25 0859) BP: 100/62 (12/25 1030) Pulse Rate: 155 (12/25 1030)  Labs: Recent Labs    12/30/22 0001 12/30/22 0239 12/30/22 0425 12/30/22 0911  HGB 11.5*  --  11.0*  --   HCT 34.3*  --  32.7*  --   PLT 203  --  189  --   HEPARINUNFRC  --   --   --  0.23*  CREATININE 1.31*  --  1.14  --   TROPONINIHS 39* 34*  --   --     Estimated Creatinine Clearance: 76.6 mL/min (by C-G formula based on SCr of 1.14 mg/dL).  Assessment: 28 yoM presented to the ED with altered mental status after being found down in car. Patient found to have atrial fibrillation w/ RVR. Pharmacy consulkted to dose for new onset atrial fibrillation.  -Hgb 11.5, plts 203 -no PTA anticoagulation reported -CHADS-VASc: 1 (but given out of system for few years, may be higher)  HL 0.23, subtherapeutic. No issues with infusion per RN  Goal of Therapy:  Heparin level 0.3-0.7 units/ml Monitor platelets by anticoagulation protocol: Yes   Plan:  Give 1500 units bolus x 1 Increase heparin infusion to 1550 units/hr Check anti-Xa level in 6-8 hours and daily while on heparin Continue to monitor H&H and platelets  Elder Cyphers, BS Pharm D, BCPS Clinical Pharmacist 12/30/2022 10:47 AM

## 2022-12-30 NOTE — ED Notes (Signed)
EMS states CBG was 122 en route

## 2022-12-30 NOTE — ED Notes (Signed)
Condom cath applied

## 2022-12-31 ENCOUNTER — Other Ambulatory Visit (HOSPITAL_COMMUNITY): Payer: Self-pay | Admitting: *Deleted

## 2022-12-31 ENCOUNTER — Inpatient Hospital Stay (HOSPITAL_COMMUNITY): Payer: Medicare Other

## 2022-12-31 ENCOUNTER — Inpatient Hospital Stay: Payer: Self-pay

## 2022-12-31 DIAGNOSIS — I509 Heart failure, unspecified: Secondary | ICD-10-CM

## 2022-12-31 DIAGNOSIS — F10929 Alcohol use, unspecified with intoxication, unspecified: Secondary | ICD-10-CM

## 2022-12-31 LAB — BASIC METABOLIC PANEL
Anion gap: 14 (ref 5–15)
BUN: 15 mg/dL (ref 8–23)
CO2: 26 mmol/L (ref 22–32)
Calcium: 8 mg/dL — ABNORMAL LOW (ref 8.9–10.3)
Chloride: 100 mmol/L (ref 98–111)
Creatinine, Ser: 1.14 mg/dL (ref 0.61–1.24)
GFR, Estimated: >60 mL/min (ref >60)
Glucose, Bld: 112 mg/dL — ABNORMAL HIGH (ref 70–99)
Potassium: 3.2 mmol/L — ABNORMAL LOW (ref 3.5–5.1)
Sodium: 140 mmol/L (ref 135–145)

## 2022-12-31 LAB — ECHOCARDIOGRAM COMPLETE
Area-P 1/2: 4.21 cm2
Height: 70 in
S' Lateral: 2.4 cm
Weight: 3488.56 [oz_av]

## 2022-12-31 LAB — CBC
HCT: 26.8 % — ABNORMAL LOW (ref 39.0–52.0)
Hemoglobin: 9 g/dL — ABNORMAL LOW (ref 13.0–17.0)
MCH: 35.2 pg — ABNORMAL HIGH (ref 26.0–34.0)
MCHC: 33.6 g/dL (ref 30.0–36.0)
MCV: 104.7 fL — ABNORMAL HIGH (ref 80.0–100.0)
Platelets: 156 10*3/uL (ref 150–400)
RBC: 2.56 MIL/uL — ABNORMAL LOW (ref 4.22–5.81)
RDW: 19.3 % — ABNORMAL HIGH (ref 11.5–15.5)
WBC: 6.7 10*3/uL (ref 4.0–10.5)
nRBC: 0 % (ref 0.0–0.2)

## 2022-12-31 LAB — COOXEMETRY PANEL
Carboxyhemoglobin: 2.6 % — ABNORMAL HIGH (ref 0.5–1.5)
Methemoglobin: 0.8 % (ref 0.0–1.5)
O2 Saturation: 67.6 %
Total hemoglobin: 10.2 g/dL — ABNORMAL LOW (ref 12.0–16.0)

## 2022-12-31 LAB — IRON AND TIBC
Iron: 184 ug/dL — ABNORMAL HIGH (ref 45–182)
Saturation Ratios: 84 % — ABNORMAL HIGH (ref 17.9–39.5)
TIBC: 219 ug/dL — ABNORMAL LOW (ref 250–450)
UIBC: 35 ug/dL

## 2022-12-31 LAB — RPR
RPR Ser Ql: NONREACTIVE
RPR Ser Ql: NONREACTIVE

## 2022-12-31 LAB — D-DIMER, QUANTITATIVE: D-Dimer, Quant: 2.79 ug{FEU}/mL — ABNORMAL HIGH (ref 0.00–0.50)

## 2022-12-31 LAB — SARS CORONAVIRUS 2 BY RT PCR: SARS Coronavirus 2 by RT PCR: NEGATIVE

## 2022-12-31 LAB — HEPARIN LEVEL (UNFRACTIONATED)
Heparin Unfractionated: 0.37 [IU]/mL (ref 0.30–0.70)
Heparin Unfractionated: 0.3 [IU]/mL (ref 0.30–0.70)

## 2022-12-31 MED ORDER — PERFLUTREN LIPID MICROSPHERE
1.0000 mL | INTRAVENOUS | Status: AC | PRN
  Administered 2022-12-31: 3 mL via INTRAVENOUS

## 2022-12-31 MED ORDER — METOPROLOL TARTRATE 25 MG PO TABS
12.5000 mg | ORAL_TABLET | Freq: Two times a day (BID) | ORAL | Status: DC
Start: 2022-12-31 — End: 2023-01-01
  Administered 2022-12-31 – 2023-01-01 (×3): 12.5 mg via ORAL
  Filled 2022-12-31 (×3): qty 1

## 2022-12-31 MED ORDER — POLYETHYLENE GLYCOL 3350 17 G PO PACK
17.0000 g | PACK | Freq: Every day | ORAL | Status: DC
  Administered 2022-12-31 – 2023-01-15 (×10): 17 g via ORAL
  Filled 2022-12-31 (×13): qty 1

## 2022-12-31 MED ORDER — DILTIAZEM HCL-DEXTROSE 125-5 MG/125ML-% IV SOLN (PREMIX)
5.0000 mg/h | INTRAVENOUS | Status: DC
  Administered 2022-12-31 – 2023-01-01 (×5): 15 mg/h via INTRAVENOUS
  Administered 2023-01-02: 5 mg/h via INTRAVENOUS
  Administered 2023-01-03 – 2023-01-04 (×6): 15 mg/h via INTRAVENOUS
  Administered 2023-01-05: 10 mg/h via INTRAVENOUS
  Administered 2023-01-05: 15 mg/h via INTRAVENOUS
  Administered 2023-01-06: 10 mg/h via INTRAVENOUS
  Filled 2022-12-31 (×16): qty 125

## 2022-12-31 MED ORDER — ALBUMIN HUMAN 25 % IV SOLN
25.0000 g | Freq: Once | INTRAVENOUS | Status: AC
  Administered 2022-12-31: 25 g via INTRAVENOUS
  Filled 2022-12-31: qty 100

## 2022-12-31 MED ORDER — POTASSIUM CHLORIDE 10 MEQ/100ML IV SOLN
10.0000 meq | INTRAVENOUS | Status: AC
  Administered 2022-12-31 (×4): 10 meq via INTRAVENOUS
  Filled 2022-12-31 (×4): qty 100

## 2022-12-31 MED ORDER — APIXABAN 5 MG PO TABS
5.0000 mg | ORAL_TABLET | Freq: Two times a day (BID) | ORAL | Status: DC
  Administered 2022-12-31: 5 mg via ORAL
  Filled 2022-12-31: qty 1

## 2022-12-31 MED ORDER — DILTIAZEM HCL 30 MG PO TABS
30.0000 mg | ORAL_TABLET | Freq: Four times a day (QID) | ORAL | Status: DC
  Administered 2022-12-31: 30 mg via ORAL
  Filled 2022-12-31: qty 1

## 2022-12-31 MED ORDER — IOHEXOL 350 MG/ML SOLN
100.0000 mL | Freq: Once | INTRAVENOUS | Status: AC | PRN
  Administered 2022-12-31: 100 mL via INTRAVENOUS

## 2022-12-31 MED ORDER — LACTATED RINGERS IV SOLN: INTRAVENOUS | Status: DC

## 2022-12-31 MED ORDER — HEPARIN (PORCINE) 25000 UT/250ML-% IV SOLN
2250.0000 [IU]/h | INTRAVENOUS | Status: DC
  Administered 2022-12-31 – 2023-01-02 (×4): 1750 [IU]/h via INTRAVENOUS
  Administered 2023-01-03: 1950 [IU]/h via INTRAVENOUS
  Administered 2023-01-04: 2250 [IU]/h via INTRAVENOUS
  Administered 2023-01-04: 2150 [IU]/h via INTRAVENOUS
  Administered 2023-01-05 – 2023-01-06 (×4): 2250 [IU]/h via INTRAVENOUS
  Filled 2022-12-31 (×12): qty 250

## 2022-12-31 MED ORDER — POTASSIUM CHLORIDE CRYS ER 20 MEQ PO TBCR
40.0000 meq | EXTENDED_RELEASE_TABLET | Freq: Once | ORAL | Status: AC
  Administered 2022-12-31: 40 meq via ORAL
  Filled 2022-12-31: qty 2

## 2022-12-31 NOTE — Progress Notes (Signed)
PHARMACY - ANTICOAGULATION CONSULT NOTE  Pharmacy Consult for heparin Indication: atrial fibrillation  No Known Allergies  Patient Measurements: Height: 5\' 10"  (177.8 cm) Weight: 97.2 kg (214 lb 4.6 oz) IBW/kg (Calculated) : 73 Heparin Dosing Weight: 93 kg  Vital Signs: Temp: 97.9 F (36.6 C) (12/25 2336) Temp Source: Oral (12/25 2336) BP: 110/73 (12/25 2030) Pulse Rate: 90 (12/25 2030)  Labs: Recent Labs    12/30/22 0001 12/30/22 0239 12/30/22 0425 12/30/22 0911 12/30/22 1641 12/31/22 0048  HGB 11.5*  --  11.0*  --   --  9.0*  HCT 34.3*  --  32.7*  --   --  26.8*  PLT 203  --  189  --   --  156  HEPARINUNFRC  --   --   --  0.23* 0.26* 0.37  CREATININE 1.31*  --  1.14  --   --  1.14  TROPONINIHS 39* 34*  --   --   --   --     Estimated Creatinine Clearance: 76.6 mL/min (by C-G formula based on SCr of 1.14 mg/dL).  Assessment: Justin Fleming is a 62 y.o. year old male admitted on 12/29/2022 with concern for new onset afib with RVR. Currently on diltiazem drip for rate control. No anticoagulation prior to admission. Pharmacy consulted to dose heparin.  Heparin level 0.37, therapeutic  Heparin drip running appropriately at 1750 units/hr. No overt s/sx of bleeding noted.   Goal of Therapy:  Heparin level 0.3-0.7 units/ml Monitor platelets by anticoagulation protocol: Yes   Plan:  Continue heparin infusion to 1750 units/hr Check anti-Xa level in 6-8 hours and daily while on heparin Continue to monitor H&H and platelets F/u plans for long-term anticoagulation   Thank you for allowing pharmacy to participate in this patient's care.  Marja Kays, PharmD Clinical Pharmacist 12/31/2022,1:45 AM

## 2022-12-31 NOTE — Discharge Instructions (Addendum)
Information on my medicine - ELIQUIS (apixaban)  This medication education was reviewed with me or my healthcare representative as part of my discharge preparation.    Why was Eliquis prescribed for you? Eliquis was prescribed for you to reduce the risk of a blood clot forming that can cause a stroke if you have a medical condition called atrial fibrillation (a type of irregular heartbeat).  What do You need to know about Eliquis ? Take your Eliquis TWICE DAILY - one tablet in the morning and one tablet in the evening with or without food. If you have difficulty swallowing the tablet whole please discuss with your pharmacist how to take the medication safely.  Take Eliquis exactly as prescribed by your doctor and DO NOT stop taking Eliquis without talking to the doctor who prescribed the medication.  Stopping may increase your risk of developing a stroke.  Refill your prescription before you run out.  After discharge, you should have regular check-up appointments with your healthcare provider that is prescribing your Eliquis.  In the future your dose may need to be changed if your kidney function or weight changes by a significant amount or as you get older.  What do you do if you miss a dose? If you miss a dose, take it as soon as you remember on the same day and resume taking twice daily.  Do not take more than one dose of ELIQUIS at the same time to make up a missed dose.  Important Safety Information A possible side effect of Eliquis is bleeding. You should call your healthcare provider right away if you experience any of the following: Bleeding from an injury or your nose that does not stop. Unusual colored urine (red or dark brown) or unusual colored stools (red or black). Unusual bruising for unknown reasons. A serious fall or if you hit your head (even if there is no bleeding).  Some medicines may interact with Eliquis and might increase your risk of bleeding or clotting  while on Eliquis. To help avoid this, consult your healthcare provider or pharmacist prior to using any new prescription or non-prescription medications, including herbals, vitamins, non-steroidal anti-inflammatory drugs (NSAIDs) and  supplements.  This website has more information on Eliquis (apixaban): http://www.eliquis.com/eliquis/home      Providers Accepting New Patients in [place], [place]    [facility] [address]  [place], [place] [address] [phone] Accepts most insurances  [facility] [address] [place], [place] [address] [phone] Accepts most insurances  [facility] [address] [place], [place] [address]  [phone] Must meet requirements  [facility] [address] [place], [place] [address] [phone] Accepts most insurances  [facility] [address]  [place], [place] [address] [phone] Accepts most insurances  [facility] [address]   [place], [place]   [phone] Accepts most insurances  [facility])  [phone] [address]  [place], [place] [address] [phone] Accepts most insurances     [facility] [address]  [place], [place] [address] [phone] Accepts most insurances  [facility] Department [address] [place], [place] [address] [phone] option 1 Accepts Medicaid and [facility] [address]  [place], [place] [address] [phone] Accepts most insurances  [place] [place] [address] [place], [place] [address] [phone] Accepts most insurances  [facility] at [facility] [address]  [place], [place] [address] [phone] Accepts most insurances  [facility] [phone] [address] [place], [place] [address] [phone] Accepts most insurances  [place],   [address] [place], [place] [address] [phone]  Accepts most insurances

## 2022-12-31 NOTE — Progress Notes (Signed)
*  PRELIMINARY RESULTS* Echocardiogram 2D Echocardiogram has been performed with Definity.  Stacey Drain 12/31/2022, 1:25 PM

## 2022-12-31 NOTE — Progress Notes (Signed)
   Progress Note  Patient Name: Justin Fleming Date of Encounter: 12/31/2022  Co-oximetry 68 per PICC.  Echocardiogram completed today showed LVEF 55 to 60%, severely dilated left atrium and trivial mitral regurgitation.  Fortunately does not have tachycardia induced cardiomyopathy.  Would focus on rate control as discussed in consultation note.  Need to clarify candidacy for uninterrupted anticoagulation prior to considering cardioversion and a lot of this will depend on his control of alcohol abuse as well.  For questions or updates, please contact [facility] Please consult [email or web address] for contact info under   Signed, Nona Dell, MD  12/31/2022, 1:37 PM

## 2022-12-31 NOTE — Progress Notes (Signed)
Patient up to bedside commode. Became very dyspneic with increased work of breathing. Oxygen saturations remained 90s. Dr. Flossie Dibble made aware. D-dimer and bilateral lower extremity ultrasound ordered. Patient resting comfortably after a few minutes of recovery laying in bed.

## 2022-12-31 NOTE — Progress Notes (Signed)
   12/31/22 1425  TOC Brief Assessment  Insurance and Status Reviewed  Patient has primary care physician No (PCP list added)  Home environment has been reviewed Home alone  Prior level of function: independent  Social Drivers of Health Review SDOH reviewed no interventions necessary  Readmission risk has been reviewed Yes  Transition of care needs no transition of care needs at this time    Patient is very sick, plans to discharge home with stable. TOC added PCP list and Substance abuse resources for patient to review when he is feeling better.   Transition of Care Department [facility]) has reviewed patient and no TOC needs have been identified at this time. We will continue to monitor patient advancement through interdisciplinary progression rounds. If new patient transition needs arise, please place a TOC consult.

## 2022-12-31 NOTE — Progress Notes (Signed)
PROGRESS NOTE    Patient: Justin Fleming                            PCP: Patient, No Pcp Per                    DOB: 12/05/1960            DOA: 12/29/2022 ZOX:[id number]             DOS: 12/31/2022, 10:14 AM   LOS: 1 day   Date of Service: The patient was seen and examined on 12/31/2022  Subjective:   The patient was seen and examined this morning, laying in bed comfortable, more awake this morning, following command. Denies any chest pain, complains of generalized weakness, Heart rate 54-134, blood pressure 119/95, satting 98% on room air No issues overnight   Brief Narrative:   History limited due to patient's intoxication/AMS, obtained by patient, chart review, EDP Hero Ainsley Duma is a 62 y.o. male with hx of alcohol abuse, hypertension, mood disorder, smoking, GERD, who was brought in after bystander contacted EMS because he was sitting in his car in the parking lot.   Found have alcohol intoxication, A-fib with RVR, suspected aspiration pneumonia, and worsening lactic acidosis with fluids.  Clinically suspect decompensated heart failure.  Denies any known history of A-fib.   EKG:  A-fib with RVR rate 159, LAD, LAFB, inferior Q's, poor R wave progression, T wave inversion laterally, no overt ischemic changes.    ED Course:  Found to be in A-fib with RVR, initially treated with metoprolol 2.5 mg IV x 1 with slight worsening of pressures to systolic in the 90s.  Started on heparin for anticoagulation.  Treated with ceftriaxone/azithromycin, 2 L IV fluid, potassium 40 mg IV and mag 4 g.  At my request consulted with cardiology fellow re: rate control options.  Cards recommending possibly metoprolol IV prn or digoxin, recommend against amiodarone.     Assessment/Plan:  Alcohol intoxication A-fib with RVR Sepsis  Alcohol intoxication, at risk for severe alcohol withdrawal -Continue to monitor for withdrawal symptoms Still tachycardic, in A-fib mildly  hypotensive Mentation back to baseline  Reports drinking up to 1.75 L of liquor a day, no known history of severe withdrawal in the past /seizure/DT.  -CIWA with Ativan initiated Librium  -High-dose thiamine per below, multivitamin, folate -TOC for substance abuse services   A-fib with RVR -new onset -New onset likely due to alcohol intoxication -Heart rate improved on Cardizem drip -CHA2DS2-VASc is at least 1-2 (HTN + suspected HF) and may be higher. Started on [facility] in the ED. on admission was initiated on as needed metoprolol 2.5 mg IV.    -Monitoring blood pressure, if drops or remains soft, will switch to amiodarone -Continue heparin drip -Cardiologist Dr. Diona Browner consulted, recommended continue heparin drip, adding metoprolol   -TSH -normal at 2.7   Suspect acute decompensated heart failure -Ruling out cardiomyopathy in the setting of current alcohol use Clinically with significant edema, JVD.  BNP elevated to 251.   Lactate worsening after IV fluids. -Stop IV fluid resuscitation.  Replete potassium, and start diuresis with Lasix 40 mg IV  -TTE-pending -Strict I's and O's, daily weights  -IV fluids initially initiated for hypotension, ruling out sepsis, subsequently will discontinue   SIRS/possible sepsis - Borderline hypotensive Lactic acidosis -improving -Sepsis ruled out-source of infection possible right lower lobe pneumonia on x-ray - Lactate worsening 3.6 ->  5 >>> 3.6 >> 2.1 -  possible right base pneumonia on chest x-ray.   - A-fib may be driven by heart failure, alcohol, or infection.  And respiratory rate also may be driven by same so unclear if truly sepsis. - Continue  Biotics - Status post 2 L IV fluid -  - Monitoring closely   Possible aspiration pneumonia Chest x-ray with right pleural effusion and associated right base atelectasis versus infiltrate.  Infection versus effusion and atelectasis. -Continue ceftriaxone, azithromycin for coverage for CAP -Check  RVP, sputum culture,  -Procalcitonin < 0.10  - Avoid nebs for now considering tachyarrhythmia. - Incentive spirometry, flutter valve   Acute myocardial injury -Ischemic demand due to A-fib with RVR -Troponin 39 >> 34, -  EKG is A-fib with RVR, nonspecific changes without overt ischemic changes. -Management of alcohol withdrawal, A-fib RVR, heart failure, borderline hypotension, aspiration pneumonia per above. -Pending echo -Appreciate cardiology recommendations   Encephalopathy  -Much improved mentation, back to baseline -initial ETOH level was 434. CT Head with mild diffuse atrophy, no acute abnormalities.    - Urine tox, UA -negative - TSH 2.7  - Folate 3.6, B12 -158,  - HIV, RPR>> nonreactive   Suspected AKI stage I Baseline creatinine unknown remote in the past was around 1.  Elevated to 1.3 on admission.  Suspect cardiorenal - Status post IV fluid per above.  Switching to diuresis with concern for heart failure. - Check PVR   Hypokalemia / Hypomagnesemia - Repleted, monitoring   Macrocytic anemia Possibly vitamin deficiency versus alcohol - Folate, B12 - low -repleting -Mild drop in hemoglobin 11 >> 9.0 (possibly delusional effect, from IV fluids, no signs of bleeding) -Obtaining Hemoccult   Periorbital edema - TSH within normal limits    History of hypertension: No recent fill of antihypertensives. Currently borderline hypotensive per above   Mood d/o- stable, not on any medications   GERD -continue PPI   ----------------------------------------------------------------------------------------------------------------------------------------------- Nutritional status:  The patient's BMI is: Body mass index is 31.28 kg/m. I agree with the assessment and plan as outlined below: Nutrition Status:       ------------------------------------------------------------------------------------------------------------------------------------------------  DVT  prophylaxis:  Heparin drip   Code Status:   Code Status: Full Code  Family Communication: No family member present at bedside- Admission status:   Status is: Inpatient Remains inpatient appropriate because: A-fib with RVR, IV Cardizem, 2 L of oxygen, treated for sepsis, lactic acidosis   Disposition: From  - home             Planning for discharge in 2-3 days: to Home w [facility]   Procedures:   No admission procedures for hospital encounter.   Antimicrobials:  Anti-infectives (From admission, onward)    Start     Dose/Rate Route Frequency Ordered Stop   12/31/22 0000  cefTRIAXone (ROCEPHIN) 2 g in sodium chloride 0.9 % 100 mL IVPB        2 g 200 mL/hr over 30 Minutes Intravenous Every 24 hours 12/30/22 0443 01/03/23 2359   12/30/22 2200  azithromycin (ZITHROMAX) tablet 500 mg        500 mg Oral Daily at bedtime 12/30/22 0443 01/01/23 2159   12/30/22 0000  cefTRIAXone (ROCEPHIN) 2 g in sodium chloride 0.9 % 100 mL IVPB        2 g 200 mL/hr over 30 Minutes Intravenous  Once 12/29/22 2345 12/30/22 0100   12/30/22 0000  azithromycin (ZITHROMAX) 500 mg in sodium chloride 0.9 % 250 mL IVPB  500 mg 250 mL/hr over 60 Minutes Intravenous  Once 12/29/22 2345 12/30/22 0158        Medication:   azithromycin  500 mg Oral QHS   chlordiazePOXIDE  50 mg Oral Q8H   Followed by   [START ON 01/01/2023] chlordiazePOXIDE  50 mg Oral Q12H   Followed by   Melene Muller ON 01/02/2023] chlordiazePOXIDE  25 mg Oral Q12H   Followed by   Melene Muller ON 01/03/2023] chlordiazePOXIDE  25 mg Oral Once   Chlorhexidine Gluconate Cloth  6 each Topical Q0600   vitamin B-12  1,000 mcg Oral Daily   folic acid  1 mg Oral Daily   metoprolol tartrate  12.5 mg Oral BID   multivitamin with minerals  1 tablet Oral Daily   sodium chloride flush  10-40 mL Intracatheter Q12H   sodium chloride flush  3 mL Intravenous Q12H    LORazepam **OR** LORazepam, sodium chloride flush   Objective:   Vitals:   12/31/22 0748  12/31/22 0800 12/31/22 0830 12/31/22 0930  BP:  (!) 112/93 (!) 114/90 (!) 119/95  Pulse: (!) 131 (!) 113 (!) 113 (!) 134  Resp: (!) 28 (!) 21 (!) 24 19  Temp:      TempSrc:      SpO2: 98% 99% 96% 98%  Weight:      Height:        Intake/Output Summary (Last 24 hours) at 12/31/2022 1014 Last data filed at 12/31/2022 0816 Gross per 24 hour  Intake 1846.63 ml  Output 1200 ml  Net 646.63 ml   Filed Weights   12/30/22 0006 12/30/22 0859 12/31/22 0448  Weight: 98.2 kg 97.2 kg 98.9 kg     Physical examination:   General:  More awake this morning AAO x 3,  cooperative, no distress;   HEENT:  Normocephalic, PERRL, otherwise with in Normal limits   Neuro:  CNII-XII intact. , normal motor and sensation, reflexes intact   Lungs:   Clear to auscultation BL, Respirations unlabored,  No wheezes / crackles  Cardio:    S1/S2, RRR, No murmure, No Rubs or Gallops   Abdomen:  Soft, non-tender, bowel sounds active all four quadrants, no guarding or peritoneal signs.  Muscular  skeletal:  Limited exam -global generalized weaknesses - in bed, able to move all 4 extremities,   2+ pulses,  symmetric, +2  pitting edema  Skin:  Dry, warm to touch, negative for any Rashes,  Wounds: Please see nursing documentation      ------------------------------------------------------------------------------------------------------------------------------------------    LABs:     Latest Ref Rng & Units 12/31/2022   12:48 AM 12/30/2022    4:25 AM 12/30/2022   12:01 AM  CBC  WBC 4.0 - 10.5 K/uL 6.7  5.8  5.0   Hemoglobin 13.0 - 17.0 g/dL 9.0  09.3  23.5   Hematocrit 39.0 - 52.0 % 26.8  32.7  34.3   Platelets 150 - 400 K/uL 156  189  203       Latest Ref Rng & Units 12/31/2022   12:48 AM 12/30/2022    4:25 AM 12/30/2022   12:01 AM  CMP  Glucose 70 - 99 mg/dL 573  89  94   BUN 8 - 23 mg/dL 15  15  15    Creatinine 0.61 - 1.24 mg/dL 2.20  2.54  2.70   Sodium 135 - 145 mmol/L 140  143  143    Potassium 3.5 - 5.1 mmol/L 3.2  3.4  2.7   Chloride  98 - 111 mmol/L 100  101  102   CO2 22 - 32 mmol/L 26  25  23    Calcium 8.9 - 10.3 mg/dL 8.0  7.9  8.1   Total Protein 6.5 - 8.1 g/dL   7.0   Total Bilirubin <1.2 mg/dL   1.1   Alkaline Phos 38 - 126 U/L   75   AST 15 - 41 U/L   22   ALT 0 - 44 U/L   10        Micro Results Recent Results (from the past 240 hours)  Culture, blood (routine x 2)     Status: None (Preliminary result)   Collection Time: 12/30/22 12:51 AM   Specimen: BLOOD  Result Value Ref Range Status   Specimen Description BLOOD BLOOD RIGHT ARM  Final   Special Requests   Final    BOTTLES DRAWN AEROBIC ONLY Blood Culture results may not be optimal due to an inadequate volume of blood received in culture bottles   Culture   Final    NO GROWTH 1 DAY Performed at [facility], [address] [place], [place] [address]    Report Status PENDING  Incomplete  Culture, blood (routine x 2)     Status: None (Preliminary result)   Collection Time: 12/30/22 12:55 AM   Specimen: BLOOD LEFT FOREARM  Result Value Ref Range Status   Specimen Description BLOOD LEFT FOREARM  Final   Special Requests   Final    BOTTLES DRAWN AEROBIC AND ANAEROBIC Blood Culture results may not be optimal due to an inadequate volume of blood received in culture bottles   Culture   Final    NO GROWTH 1 DAY Performed at [facility], [address], [place], [place] [address]    Report Status PENDING  Incomplete  Respiratory (~20 pathogens) panel by PCR     Status: None   Collection Time: 12/30/22  3:59 AM   Specimen: Nasopharyngeal Swab; Respiratory  Result Value Ref Range Status   Adenovirus NOT DETECTED NOT DETECTED Final   Coronavirus 229E NOT DETECTED NOT DETECTED Final    Comment: (NOTE) The Coronavirus on the Respiratory Panel, DOES NOT test for the novel  Coronavirus (2019 nCoV)    Coronavirus HKU1 NOT DETECTED NOT DETECTED Final   Coronavirus NL63 NOT DETECTED NOT DETECTED Final    Coronavirus OC43 NOT DETECTED NOT DETECTED Final   Metapneumovirus NOT DETECTED NOT DETECTED Final   Rhinovirus / Enterovirus NOT DETECTED NOT DETECTED Final   Influenza A NOT DETECTED NOT DETECTED Final   Influenza B NOT DETECTED NOT DETECTED Final   Parainfluenza Virus 1 NOT DETECTED NOT DETECTED Final   Parainfluenza Virus 2 NOT DETECTED NOT DETECTED Final   Parainfluenza Virus 3 NOT DETECTED NOT DETECTED Final   Parainfluenza Virus 4 NOT DETECTED NOT DETECTED Final   Respiratory Syncytial Virus NOT DETECTED NOT DETECTED Final   Bordetella pertussis NOT DETECTED NOT DETECTED Final   Bordetella Parapertussis NOT DETECTED NOT DETECTED Final   Chlamydophila pneumoniae NOT DETECTED NOT DETECTED Final   Mycoplasma pneumoniae NOT DETECTED NOT DETECTED Final    Comment: Performed at [facility], [address] [place], [place] [address]  MRSA Next Gen by PCR, Nasal     Status: None   Collection Time: 12/30/22  9:06 AM   Specimen: Nasal Mucosa; Nasal Swab  Result Value Ref Range Status   MRSA by PCR Next Gen NOT DETECTED NOT DETECTED Final  Comment: (NOTE) The GeneXpert MRSA Assay (FDA approved for NASAL specimens only), is one component of a comprehensive MRSA colonization surveillance program. It is not intended to diagnose MRSA infection nor to guide or monitor treatment for MRSA infections. Test performance is not FDA approved in patients less than 24 years old. Performed at [facility], [address], [place], [place] [address]     Radiology Reports DG CHEST PORT 1 VIEW Result Date: 12/30/2022 CLINICAL DATA:  PICC line placement. EXAM: PORTABLE CHEST 1 VIEW COMPARISON:  One-view chest x-ray 12/29/2022 FINDINGS: Heart is enlarged. Right pleural effusion remains. Mild pulmonary vascular congestion is present bilaterally. A new right-sided PICC line is in place. The tip is at the cavoatrial junction. No pneumothorax is present. The visualized soft tissues and bony thorax  are otherwise unremarkable. IMPRESSION: 1. New right-sided PICC line with tip at the cavoatrial junction. 2. Cardiomegaly and mild pulmonary vascular congestion. 3. Persistent right pleural effusion. Electronically Signed   By: Marin Roberts M.D.   On: 12/30/2022 16:25   [place] EKG SITE RITE Result Date: 12/30/2022 If Site Rite image not attached, placement could not be confirmed due to current cardiac rhythm.   SIGNED: Kendell Bane, MD, FHM. FAAFP. [facility] - [facility] Critical care time spent - 55 min.  In seeing, evaluating and examining the patient. Reviewing medical records, labs, drawn plan of care. [facility],  Pager (please use [email or web address] to page/ text) Please use Epic Secure Chat for non-urgent communication (7AM-7PM)  If 7PM-7AM, please contact night-coverage [email or web address], 12/31/2022, 10:14 AM

## 2022-12-31 NOTE — Progress Notes (Addendum)
PHARMACY - ANTICOAGULATION CONSULT NOTE  Pharmacy Consult for heparin  Indication: atrial fibrillation  No Known Allergies  Patient Measurements: Height: 5\' 10"  (177.8 cm) Weight: 98.9 kg (218 lb 0.6 oz) IBW/kg (Calculated) : 73 Heparin Dosing Weight: 93 kg  Vital Signs: Temp: 97.9 F (36.6 C) (12/26 0448) Temp Source: Oral (12/26 0448) BP: 129/98 (12/26 0700) Pulse Rate: 108 (12/26 0730)  Labs: Recent Labs    12/30/22 0001 12/30/22 0239 12/30/22 0425 12/30/22 0911 12/30/22 1641 12/31/22 0048  HGB 11.5*  --  11.0*  --   --  9.0*  HCT 34.3*  --  32.7*  --   --  26.8*  PLT 203  --  189  --   --  156  HEPARINUNFRC  --   --   --  0.23* 0.26* 0.37  CREATININE 1.31*  --  1.14  --   --  1.14  TROPONINIHS 39* 34*  --   --   --   --     Estimated Creatinine Clearance: 77.2 mL/min (by C-G formula based on SCr of 1.14 mg/dL).  Assessment: Garcia Carrisalez Vohra is a 62 y.o. year old male admitted on 12/29/2022 with concern for new onset afib with RVR. Currently on diltiazem drip for rate control. No anticoagulation prior to admission. Pharmacy consulted to transition from heparin to apixaban  Update: cards wants patient to remain on heparin infusion- he received one dose of Eliquis    Goal of Therapy:  Heparin level 0.3-0.7 units/ml Monitor platelets by anticoagulation protocol: Yes   Plan:  Restart heparin infusion at 2000 due to Eliquis dose this AM Heparin infusion @ 1750 units/hr Heparin level in 6-8 hours and daily. Continue to monitor H&H and platelets   Thank you for allowing pharmacy to participate in this patient's care.  Judeth Cornfield, PharmD Clinical Pharmacist 12/31/2022 7:37 AM
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
## 2023-01-01 DIAGNOSIS — I4891 Unspecified atrial fibrillation: Secondary | ICD-10-CM

## 2023-01-01 DIAGNOSIS — K921 Melena: Secondary | ICD-10-CM

## 2023-01-01 DIAGNOSIS — E872 Acidosis, unspecified: Secondary | ICD-10-CM

## 2023-01-01 DIAGNOSIS — I4819 Other persistent atrial fibrillation: Secondary | ICD-10-CM

## 2023-01-01 DIAGNOSIS — D62 Acute posthemorrhagic anemia: Secondary | ICD-10-CM | POA: Diagnosis present

## 2023-01-01 LAB — BASIC METABOLIC PANEL
Anion gap: 8 (ref 5–15)
BUN: 17 mg/dL (ref 8–23)
CO2: 29 mmol/L (ref 22–32)
Calcium: 8.5 mg/dL — ABNORMAL LOW (ref 8.9–10.3)
Chloride: 100 mmol/L (ref 98–111)
Creatinine, Ser: 1.04 mg/dL (ref 0.61–1.24)
GFR, Estimated: >60 mL/min (ref >60)
Glucose, Bld: 138 mg/dL — ABNORMAL HIGH (ref 70–99)
Potassium: 3.9 mmol/L (ref 3.5–5.1)
Sodium: 137 mmol/L (ref 135–145)

## 2023-01-01 LAB — CBC
HCT: 27.9 % — ABNORMAL LOW (ref 39.0–52.0)
Hemoglobin: 8.7 g/dL — ABNORMAL LOW (ref 13.0–17.0)
MCH: 34.1 pg — ABNORMAL HIGH (ref 26.0–34.0)
MCHC: 31.2 g/dL (ref 30.0–36.0)
MCV: 109.4 fL — ABNORMAL HIGH (ref 80.0–100.0)
Platelets: 113 10*3/uL — ABNORMAL LOW (ref 150–400)
RBC: 2.55 MIL/uL — ABNORMAL LOW (ref 4.22–5.81)
RDW: 19.8 % — ABNORMAL HIGH (ref 11.5–15.5)
WBC: 8.6 10*3/uL (ref 4.0–10.5)
nRBC: 0.2 % (ref 0.0–0.2)

## 2023-01-01 LAB — T3, FREE: T3, Free: 2.7 pg/mL (ref 2.0–4.4)

## 2023-01-01 LAB — HEPARIN LEVEL (UNFRACTIONATED)
Heparin Unfractionated: >1.1 [IU]/mL — ABNORMAL HIGH (ref 0.30–0.70)
Heparin Unfractionated: 1.07 [IU]/mL — ABNORMAL HIGH (ref 0.30–0.70)

## 2023-01-01 LAB — APTT
aPTT: 87 s — ABNORMAL HIGH (ref 24–36)
aPTT: 82 s — ABNORMAL HIGH (ref 24–36)

## 2023-01-01 LAB — OCCULT BLOOD X 1 CARD TO LAB, STOOL: Fecal Occult Bld: POSITIVE — AB

## 2023-01-01 LAB — HEMOGLOBIN A1C
Hgb A1c MFr Bld: 5 % (ref 4.8–5.6)
Mean Plasma Glucose: 97 mg/dL

## 2023-01-01 LAB — BRAIN NATRIURETIC PEPTIDE: B Natriuretic Peptide: 549 pg/mL — ABNORMAL HIGH (ref 0.0–100.0)

## 2023-01-01 MED ORDER — DIGOXIN 125 MCG PO TABS
0.2500 mg | ORAL_TABLET | Freq: Every day | ORAL | Status: DC
  Administered 2023-01-02 – 2023-01-11 (×10): 0.25 mg via ORAL
  Filled 2023-01-01 (×10): qty 2

## 2023-01-01 MED ORDER — SUCRALFATE 1 GM/10ML PO SUSP
1.0000 g | Freq: Three times a day (TID) | ORAL | Status: DC
Start: 2023-01-01 — End: 2023-01-16
  Administered 2023-01-01 – 2023-01-16 (×57): 1 g via ORAL
  Filled 2023-01-01 (×57): qty 10

## 2023-01-01 MED ORDER — FUROSEMIDE 10 MG/ML IJ SOLN
40.0000 mg | Freq: Two times a day (BID) | INTRAMUSCULAR | Status: DC
  Administered 2023-01-01 – 2023-01-04 (×6): 40 mg via INTRAVENOUS
  Filled 2023-01-01 (×6): qty 4

## 2023-01-01 MED ORDER — PANTOPRAZOLE SODIUM 40 MG IV SOLR
40.0000 mg | Freq: Two times a day (BID) | INTRAVENOUS | Status: DC
  Administered 2023-01-01 – 2023-01-15 (×29): 40 mg via INTRAVENOUS
  Filled 2023-01-01 (×29): qty 10

## 2023-01-01 MED ORDER — CYANOCOBALAMIN 1000 MCG/ML IJ SOLN: 1000.0000 ug | Freq: Once | INTRAMUSCULAR | Status: DC

## 2023-01-01 MED ORDER — THIAMINE HCL 100 MG/ML IJ SOLN
INTRAMUSCULAR | Status: AC
Start: 2023-01-01 — End: ?
  Filled 2023-01-01: qty 6

## 2023-01-01 MED ORDER — ORAL CARE MOUTH RINSE: 15.0000 mL | OROMUCOSAL | Status: DC | PRN

## 2023-01-01 MED ORDER — DIGOXIN 0.25 MG/ML IJ SOLN
0.5000 mg | Freq: Once | INTRAMUSCULAR | Status: AC
  Administered 2023-01-01: 0.5 mg via INTRAVENOUS
  Filled 2023-01-01: qty 2

## 2023-01-01 MED ORDER — METOPROLOL TARTRATE 25 MG PO TABS
12.5000 mg | ORAL_TABLET | Freq: Four times a day (QID) | ORAL | Status: DC
  Administered 2023-01-01 – 2023-01-05 (×16): 12.5 mg via ORAL
  Filled 2023-01-01 (×16): qty 1

## 2023-01-01 NOTE — Progress Notes (Addendum)
PROGRESS NOTE    Patient: Justin Fleming                            PCP: Patient, No Pcp Per                    DOB: [birth year]            DOA: 12/29/2022 ZOX:[id number]             DOS: 01/01/2023, 5:46 PM   LOS: 2 days   Date of Service: The patient was seen and examined on 01/01/2023  Subjective:   -Patient Sister and niece at bedside, questions answered -Some conversational dyspnea noted -Tachycardia persist   Brief Narrative:   History limited due to patient's intoxication/AMS, obtained by patient, chart review, EDP Justin Fleming is a 62 y.o. male with hx of alcohol abuse, hypertension, mood disorder, smoking, GERD, who was brought in after bystander contacted EMS because he was sitting in his car in the parking lot.   Found have alcohol intoxication, A-fib with RVR, suspected aspiration pneumonia, and worsening lactic acidosis with fluids.  Clinically suspect decompensated heart failure.  Denies any known history of A-fib.   EKG:  A-fib with RVR rate 159, LAD, LAFB, inferior Q's, poor R wave progression, T wave inversion laterally, no overt ischemic changes.    ED Course:  Found to be in A-fib with RVR, initially treated with metoprolol 2.5 mg IV x 1 with slight worsening of pressures to systolic in the 90s.  Started on heparin for anticoagulation.  Treated with ceftriaxone/azithromycin, 2 L IV fluid, potassium 40 mg IV and mag 4 g.  At my request consulted with cardiology fellow re: rate control options.  Cards recommending possibly metoprolol IV prn or digoxin, recommend against amiodarone.     Assessment/Plan:  1)Alcohol intoxication/DTs -Reports drinking up to 1.75 L of liquor a day -- Librium taper, , continue folic acid thiamine and multivitamin  - 2)A-fib with RVR -new onset -New onset  in the setting of alcohol intoxication -CHA2DS2-VASc is at least 1 (HTN) -Echo from 12/31/2022 with EF of 55 to 60%, bilateral atrial enlargement noted, no significant  valvular abnormalities -Cardiology consult appreciated --TSH -normal at 2.7 -Potassium and magnesium WNL -Continue IV heparin for anticoagulation -Rate control remains challenging -Continue IV Cardizem for rate control -Cardiologist recommends titrating of Toprol-XL and adding digoxin  3)HFpEF --acute diastolic dysfunction CHF-echo as noted above #2 -Clinically appears volume overloaded--+ve JVD, significant peripheral edema including edema of the genitalia -Chest x-ray on admission on 12/30/2022  showed pulmonary venous congestion and right sided pleural effusion -CTA on 12/31/2022 with bilateral pleural effusions -IV Lasix -Daily weights, fluid input and output monitoring as ordered REDs Vest  4)Acute hypoxic respiratory failure--- due to #2 and  #3 above ?? Doubt significant PNA---- -No leukocytosis, No fevers, PCT Negative -Lactic acidosis improved with hydration -Blood cultures NGTD -Patient was previously started on Rocephin--currently completing same -No Further antibiotics indicated -Frank Sepsis ruled out  5)Acute metabolic encephalopathy-----due to above -CT Head with mild diffuse atrophy, no acute abnormalities.   -Mentation is improving  6)Acute myocardial injury -Ischemic demand due to A-fib with RVR -Troponin 39 >> 34, -  EKG is A-fib with RVR, nonspecific changes without overt ischemic changes. -Management of alcohol withdrawal, A-fib RVR, heart failure, borderline hypotension, aspiration pneumonia per above. -Echo from 12/31/2022 with preserved EF, without wall motion abnormalities -Appreciate cardiology recommendations  7)-acute anemia with heme positive stool--- ??  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 -Appears macrocytic and hyperchromic - Folate 3.6, B12 -158----B12 and folic acid replacement initiated -- Iron 184, saturation 84%  Hgb trended down from 11.5 on admission down to 8.7 at this time -PPI ordered -GI consult appreciated -Defer timing of  possible EGD and colonoscopy to GI team (colonoscopy was in 2016, no prior EGD)  8)AKI----acute kidney injury---due to dehydration -  creatinine on admission= 1.31 , - baseline creatinine =1.0     -Creatinine has normalized  -renally adjust medications, avoid nephrotoxic agents / dehydration  / hypotension  9)Social/Ethics--- prior to admission lived alone, admits to heavy alcohol abuse -Patient Sister Justin Fleming is primary contact  ----------------------------------------------------------------------------------------------------------------------------------------------- Nutritional status:  The patient's BMI is: Body mass index is 32.11 kg/m. I agree with the assessment and plan as outlined below: Nutrition Status:  DVT prophylaxis:  Heparin drip  Code Status:   Code Status: Full Code  Family Communication: Discussed with sister  Admission status:    Inpatient  Disposition: From  - home             Planning for discharge to SNF rehab Vs Home with [facility] Procedures:   Antimicrobials:  Anti-infectives (From admission, onward)    Start     Dose/Rate Route Frequency Ordered Stop   12/31/22 0000  cefTRIAXone (ROCEPHIN) 2 g in sodium chloride 0.9 % 100 mL IVPB        2 g 200 mL/hr over 30 Minutes Intravenous Every 24 hours 12/30/22 0443 01/02/23 0600   12/30/22 2200  azithromycin (ZITHROMAX) tablet 500 mg        500 mg Oral Daily at bedtime 12/30/22 0443 12/31/22 2112   12/30/22 0000  cefTRIAXone (ROCEPHIN) 2 g in sodium chloride 0.9 % 100 mL IVPB        2 g 200 mL/hr over 30 Minutes Intravenous  Once 12/29/22 2345 12/30/22 0100   12/30/22 0000  azithromycin (ZITHROMAX) 500 mg in sodium chloride 0.9 % 250 mL IVPB        500 mg 250 mL/hr over 60 Minutes Intravenous  Once 12/29/22 2345 12/30/22 0158      Medication:   chlordiazePOXIDE  50 mg Oral Q12H   Followed by   Melene Muller ON 01/02/2023] chlordiazePOXIDE  25 mg Oral Q12H   Followed by   Melene Muller ON 01/03/2023]  chlordiazePOXIDE  25 mg Oral Once   Chlorhexidine Gluconate Cloth  6 each Topical Q0600   cyanocobalamin  1,000 mcg Intramuscular Once   vitamin B-12  1,000 mcg Oral Daily   [START ON 01/02/2023] digoxin  0.25 mg Oral Daily   folic acid  1 mg Oral Daily   furosemide  40 mg Intravenous Q12H   metoprolol tartrate  12.5 mg Oral QID   multivitamin with minerals  1 tablet Oral Daily   pantoprazole (PROTONIX) IV  40 mg Intravenous Q12H   polyethylene glycol  17 g Oral Daily   sodium chloride flush  10-40 mL Intracatheter Q12H   sodium chloride flush  3 mL Intravenous Q12H   sucralfate  1 g Oral TID WC & HS    LORazepam **OR** LORazepam, sodium chloride flush   Objective:   Vitals:   01/01/23 1400 01/01/23 1402 01/01/23 1500 01/01/23 1600  BP: 105/64 105/64 (!) 116/98 119/69  Pulse: (!) 102 90 93 88  Resp: 20  (!) 21 19  Temp:   99.4 F (37.4 C)   TempSrc:   Oral  SpO2: 100%  99% 100%  Weight:      Height:        Intake/Output Summary (Last 24 hours) at 01/01/2023 1746 Last data filed at 01/01/2023 1501 Gross per 24 hour  Intake 964.7 ml  Output 300 ml  Net 664.7 ml   Filed Weights   12/30/22 0859 12/31/22 0448 01/01/23 0500  Weight: 97.2 kg 98.9 kg 101.5 kg    Physical examination:    Physical Exam  Gen:- Awake Alert,  mild conversational dyspnea HEENT:- [place].AT, No sclera icterus Nose- [place] 2L/min Neck-Supple Neck,  +ve JVD,.  Lungs- diminished breath sounds with scattered rales bibasilarly CV- S1, S2 normal, RRR Abd-  +ve B.Sounds, Abd Soft, No tenderness,    Extremity/Skin:- +ve  edema,   good pedal pulses  Psych-affect is appropriate, oriented x3 Neuro-generalized weakness, no new focal deficits, mild tremors GU-penile and scrotal edema  Wounds: Please see nursing documentation  LABs:     Latest Ref Rng & Units 01/01/2023    4:56 AM 12/31/2022   12:48 AM 12/30/2022    4:25 AM  CBC  WBC 4.0 - 10.5 K/uL 8.6  6.7  5.8   Hemoglobin 13.0 - 17.0 g/dL 8.7   9.0  01.0   Hematocrit 39.0 - 52.0 % 27.9  26.8  32.7   Platelets 150 - 400 K/uL 113  156  189       Latest Ref Rng & Units 01/01/2023    4:56 AM 12/31/2022   12:48 AM 12/30/2022    4:25 AM  CMP  Glucose 70 - 99 mg/dL 272  536  89   BUN 8 - 23 mg/dL 17  15  15    Creatinine 0.61 - 1.24 mg/dL 6.44  0.34  7.42   Sodium 135 - 145 mmol/L 137  140  143   Potassium 3.5 - 5.1 mmol/L 3.9  3.2  3.4   Chloride 98 - 111 mmol/L 100  100  101   CO2 22 - 32 mmol/L 29  26  25    Calcium 8.9 - 10.3 mg/dL 8.5  8.0  7.9    Micro Results Recent Results (from the past 240 hours)  Culture, blood (routine x 2)     Status: None (Preliminary result)   Collection Time: 12/30/22 12:51 AM   Specimen: BLOOD  Result Value Ref Range Status   Specimen Description BLOOD BLOOD RIGHT ARM  Final   Special Requests   Final    BOTTLES DRAWN AEROBIC ONLY Blood Culture results may not be optimal due to an inadequate volume of blood received in culture bottles   Culture   Final    NO GROWTH 2 DAYS Performed at [facility], [address] [place], [place] [address]    Report Status PENDING  Incomplete  Culture, blood (routine x 2)     Status: None (Preliminary result)   Collection Time: 12/30/22 12:55 AM   Specimen: BLOOD LEFT FOREARM  Result Value Ref Range Status   Specimen Description BLOOD LEFT FOREARM  Final   Special Requests   Final    BOTTLES DRAWN AEROBIC AND ANAEROBIC Blood Culture results may not be optimal due to an inadequate volume of blood received in culture bottles   Culture   Final    NO GROWTH 2 DAYS Performed at [facility], [address], [place], [place] [address]    Report Status PENDING  Incomplete  Respiratory (~20 pathogens) panel by PCR     Status: None  Collection Time: 12/30/22  3:59 AM   Specimen: Nasopharyngeal Swab; Respiratory  Result Value Ref Range Status   Adenovirus NOT DETECTED NOT DETECTED Final   Coronavirus 229E NOT DETECTED NOT DETECTED Final    Comment:  (NOTE) The Coronavirus on the Respiratory Panel, DOES NOT test for the novel  Coronavirus (2019 nCoV)    Coronavirus HKU1 NOT DETECTED NOT DETECTED Final   Coronavirus NL63 NOT DETECTED NOT DETECTED Final   Coronavirus OC43 NOT DETECTED NOT DETECTED Final   Metapneumovirus NOT DETECTED NOT DETECTED Final   Rhinovirus / Enterovirus NOT DETECTED NOT DETECTED Final   Influenza A NOT DETECTED NOT DETECTED Final   Influenza B NOT DETECTED NOT DETECTED Final   Parainfluenza Virus 1 NOT DETECTED NOT DETECTED Final   Parainfluenza Virus 2 NOT DETECTED NOT DETECTED Final   Parainfluenza Virus 3 NOT DETECTED NOT DETECTED Final   Parainfluenza Virus 4 NOT DETECTED NOT DETECTED Final   Respiratory Syncytial Virus NOT DETECTED NOT DETECTED Final   Bordetella pertussis NOT DETECTED NOT DETECTED Final   Bordetella Parapertussis NOT DETECTED NOT DETECTED Final   Chlamydophila pneumoniae NOT DETECTED NOT DETECTED Final   Mycoplasma pneumoniae NOT DETECTED NOT DETECTED Final    Comment: Performed at [facility], [address], [place], [place] [address]  MRSA Next Gen by PCR, Nasal     Status: None   Collection Time: 12/30/22  9:06 AM   Specimen: Nasal Mucosa; Nasal Swab  Result Value Ref Range Status   MRSA by PCR Next Gen NOT DETECTED NOT DETECTED Final    Comment: (NOTE) The GeneXpert MRSA Assay (FDA approved for NASAL specimens only), is one component of a comprehensive MRSA colonization surveillance program. It is not intended to diagnose MRSA infection nor to guide or monitor treatment for MRSA infections. Test performance is not FDA approved in patients less than 58 years old. Performed at [facility], [address] [place], [place] [address]   SARS Coronavirus 2 by RT PCR (hospital order, performed in [facility] hospital lab) *cepheid single result test* Anterior Nasal Swab     Status: None   Collection Time: 12/31/22 12:31 PM   Specimen: Anterior Nasal Swab  Result Value Ref  Range Status   SARS Coronavirus 2 by RT PCR NEGATIVE NEGATIVE Final    Comment: (NOTE) SARS-CoV-2 target nucleic acids are NOT DETECTED.  The SARS-CoV-2 RNA is generally detectable in upper and lower respiratory specimens during the acute phase of infection. The lowest concentration of SARS-CoV-2 viral copies this assay can detect is 250 copies / mL. A negative result does not preclude SARS-CoV-2 infection and should not be used as the sole basis for treatment or other patient management decisions.  A negative result may occur with improper specimen collection / handling, submission of specimen other than nasopharyngeal swab, presence of viral mutation(s) within the areas targeted by this assay, and inadequate number of viral copies (<250 copies / mL). A negative result must be combined with clinical observations, patient history, and epidemiological information.  Fact Sheet for Patients:   RoadLapTop.co.za  Fact Sheet for Healthcare Providers: http://kim-miller.com/  This test is not yet approved or  cleared by the [place] FDA and has been authorized for detection and/or diagnosis of SARS-CoV-2 by FDA under an Emergency Use Authorization (EUA).  This EUA will remain in effect (meaning this test can be used) for the duration of the COVID-19 declaration under Section 564(b)(1) of the Act, 21 U.S.C. section 360bbb-3(b)(1), unless  the authorization is terminated or revoked sooner.  Performed at [facility], [address], [place], [place] [address]    Radiology Reports CT Angio Chest Pulmonary Embolism (PE) W or WO Contrast Result Date: 12/31/2022 CLINICAL DATA:  Elevated D-dimer and peripheral swelling EXAM: CT ANGIOGRAPHY CHEST WITH CONTRAST TECHNIQUE: Multidetector CT imaging of the chest was performed using the standard protocol during bolus administration of intravenous contrast. Multiplanar CT image reconstructions and MIPs  were obtained to evaluate the vascular anatomy. RADIATION DOSE REDUCTION: This exam was performed according to the departmental dose-optimization program which includes automated exposure control, adjustment of the mA and/or kV according to patient size and/or use of iterative reconstruction technique. CONTRAST:  OMNIPAQUE IOHEXOL 350 MG/ML SOLN COMPARISON:  Chest x-ray from the previous day. FINDINGS: Cardiovascular: Thoracic aorta shows a normal branching pattern. No aneurysmal dilatation or dissection is noted. Heart is at the upper limits of normal in size. The pulmonary artery shows a normal branching pattern bilaterally. No filling defect to suggest pulmonary embolism is noted. Coronary calcifications are seen. Right PICC is noted. Mediastinum/Nodes: Thoracic inlet is within normal limits. No hilar or mediastinal adenopathy is noted. The esophagus as visualized is within normal limits. Lungs/Pleura: Small left-sided pleural effusion is noted. Tiny right-sided pleural effusion is noted with the predominant lateral component. Bilateral lower lobe atelectasis is seen. No focal confluent infiltrate is noted. No parenchymal nodules are noted. Upper Abdomen: No acute abnormality. Musculoskeletal: Degenerative changes of the thoracic spine are noted. Review of the MIP images confirms the above findings. IMPRESSION: Small pleural effusions, left greater than right with lower lobe atelectasis. No evidence of pulmonary emboli. Electronically Signed   By: Alcide Clever M.D.   On: 12/31/2022 18:53    SIGNED: Shon Hale, MD,  - [facility],  Pager (please use [email or web address] to page/ text) Please use Epic Secure Chat for non-urgent communication (7AM-7PM)  If 7PM-7AM, please contact night-coverage [email or web address], 01/01/2023, 5:46 PM

## 2023-01-01 NOTE — Plan of Care (Signed)

## 2023-01-01 NOTE — Progress Notes (Signed)
Rounding Note    Patient Name: Justin Fleming Date of Encounter: 01/01/2023  [facility] Cardiologist:  New  Subjective   Pt denies palpitations    York Spaniel he noticed heart going fast before he came into hospital   Inpatient Medications    Scheduled Meds:  chlordiazePOXIDE  50 mg Oral Q12H   Followed by   Melene Muller ON 01/02/2023] chlordiazePOXIDE  25 mg Oral Q12H   Followed by   Melene Muller ON 01/03/2023] chlordiazePOXIDE  25 mg Oral Once   Chlorhexidine Gluconate Cloth  6 each Topical Q0600   cyanocobalamin  1,000 mcg Intramuscular Once   vitamin B-12  1,000 mcg Oral Daily   folic acid  1 mg Oral Daily   metoprolol tartrate  12.5 mg Oral BID   multivitamin with minerals  1 tablet Oral Daily   polyethylene glycol  17 g Oral Daily   sodium chloride flush  10-40 mL Intracatheter Q12H   sodium chloride flush  3 mL Intravenous Q12H   Continuous Infusions:  cefTRIAXone (ROCEPHIN)  IV Stopped (12/31/22 2347)   diltiazem (CARDIZEM) infusion 15 mg/hr (01/01/23 0648)   heparin 1,750 Units/hr (01/01/23 4098)   thiamine (VITAMIN B1) injection Stopped (01/01/23 0606)   PRN Meds: LORazepam **OR** LORazepam, sodium chloride flush   Vital Signs    Vitals:   01/01/23 0430 01/01/23 0500 01/01/23 0530 01/01/23 0754  BP: 105/83 109/68 92/66   Pulse: 70 92 92   Resp: 16 16 18    Temp:    98.2 F (36.8 C)  TempSrc:    Axillary  SpO2: 99% 99% 98%   Weight:  101.5 kg    Height:        Intake/Output Summary (Last 24 hours) at 01/01/2023 0847 Last data filed at 01/01/2023 0648 Gross per 24 hour  Intake 948.36 ml  Output 700 ml  Net 248.36 ml      01/01/2023    5:00 AM 12/31/2022    4:48 AM 12/30/2022    8:59 AM  Last 3 Weights  Weight (lbs) 223 lb 12.3 oz 218 lb 0.6 oz 214 lb 4.6 oz  Weight (kg) 101.5 kg 98.9 kg 97.2 kg      Telemetry    Atrial fibrillation   90s to 110s  - Personally Reviewed  ECG    No new  - Personally Reviewed  Physical Exam  ] GEN: Pt  is in no acute distress.   Neck: JVP is increased  Cardiac:  irerg irreg   No S3    Respiratory: Clear to auscultation bilaterally. GI: Soft, nontender, non-distended  MS: 1+ :e edema; Labs    High Sensitivity Troponin:   Recent Labs  Lab 12/30/22 0001 12/30/22 0239  TROPONINIHS 39* 34*     Chemistry Recent Labs  Lab 12/30/22 0001 12/30/22 0425 12/31/22 0048 01/01/23 0456  NA 143 143 140 137  K 2.7* 3.4* 3.2* 3.9  CL 102 101 100 100  CO2 23 25 26 29   GLUCOSE 94 89 112* 138*  BUN 15 15 15 17   CREATININE 1.31* 1.14 1.14 1.04  CALCIUM 8.1* 7.9* 8.0* 8.5*  MG 1.9 1.9  --   --   PROT 7.0  --   --   --   ALBUMIN 3.1*  --   --   --   AST 22  --   --   --   ALT 10  --   --   --   ALKPHOS 75  --   --   --  BILITOT 1.1  --   --   --   GFRNONAA >60 >60 >60 >60  ANIONGAP 18* 17* 14 8    Lipids  Recent Labs  Lab 12/30/22 0425  CHOL 147  TRIG 59  HDL 66  LDLCALC 69  CHOLHDL 2.2    Hematology Recent Labs  Lab 12/30/22 0425 12/31/22 0048 01/01/23 0456  WBC 5.8 6.7 8.6  RBC 3.10* 2.56* 2.55*  HGB 11.0* 9.0* 8.7*  HCT 32.7* 26.8* 27.9*  MCV 105.5* 104.7* 109.4*  MCH 35.5* 35.2* 34.1*  MCHC 33.6 33.6 31.2  RDW 19.3* 19.3* 19.8*  PLT 189 156 113*   Thyroid  Recent Labs  Lab 12/30/22 0425 12/30/22 1641  TSH 2.768  --   FREET4  --  1.02    BNP Recent Labs  Lab 12/30/22 0001 12/30/22 0425 01/01/23 0456  BNP 251.0* 217.0* 549.0*    DDimer  Recent Labs  Lab 12/31/22 1613  DDIMER 2.79*     Radiology    CT Angio Chest Pulmonary Embolism (PE) W or WO Contrast Result Date: 12/31/2022 CLINICAL DATA:  Elevated D-dimer and peripheral swelling EXAM: CT ANGIOGRAPHY CHEST WITH CONTRAST TECHNIQUE: Multidetector CT imaging of the chest was performed using the standard protocol during bolus administration of intravenous contrast. Multiplanar CT image reconstructions and MIPs were obtained to evaluate the vascular anatomy. RADIATION DOSE REDUCTION: This exam was  performed according to the departmental dose-optimization program which includes automated exposure control, adjustment of the mA and/or kV according to patient size and/or use of iterative reconstruction technique. CONTRAST:  OMNIPAQUE IOHEXOL 350 MG/ML SOLN COMPARISON:  Chest x-ray from the previous day. FINDINGS: Cardiovascular: Thoracic aorta shows a normal branching pattern. No aneurysmal dilatation or dissection is noted. Heart is at the upper limits of normal in size. The pulmonary artery shows a normal branching pattern bilaterally. No filling defect to suggest pulmonary embolism is noted. Coronary calcifications are seen. Right PICC is noted. Mediastinum/Nodes: Thoracic inlet is within normal limits. No hilar or mediastinal adenopathy is noted. The esophagus as visualized is within normal limits. Lungs/Pleura: Small left-sided pleural effusion is noted. Tiny right-sided pleural effusion is noted with the predominant lateral component. Bilateral lower lobe atelectasis is seen. No focal confluent infiltrate is noted. No parenchymal nodules are noted. Upper Abdomen: No acute abnormality. Musculoskeletal: Degenerative changes of the thoracic spine are noted. Review of the MIP images confirms the above findings. IMPRESSION: Small pleural effusions, left greater than right with lower lobe atelectasis. No evidence of pulmonary emboli. Electronically Signed   By: Alcide Clever M.D.   On: 12/31/2022 18:53   US Venous Img Lower Bilateral (DVT) Result Date: 12/31/2022 CLINICAL DATA:  Lower extremity swelling EXAM: BILATERAL LOWER EXTREMITY VENOUS DOPPLER ULTRASOUND TECHNIQUE: Gray-scale sonography with graded compression, as well as color Doppler and duplex ultrasound were performed to evaluate the lower extremity deep venous systems from the level of the common femoral vein and including the common femoral, femoral, profunda femoral, popliteal and calf veins including the posterior tibial, peroneal and  gastrocnemius veins when visible. The superficial great saphenous vein was also interrogated. Spectral Doppler was utilized to evaluate flow at rest and with distal augmentation maneuvers in the common femoral, femoral and popliteal veins. COMPARISON:  None Available. FINDINGS: RIGHT LOWER EXTREMITY Common Femoral Vein: No evidence of thrombus. Normal compressibility, respiratory phasicity and response to augmentation. Saphenofemoral Junction: No evidence of thrombus. Normal compressibility and flow on color Doppler imaging. Profunda Femoral Vein: No evidence of thrombus. Normal  compressibility and flow on color Doppler imaging. Femoral Vein: No evidence of thrombus. Normal compressibility, respiratory phasicity and response to augmentation. Popliteal Vein: No evidence of thrombus. Normal compressibility, respiratory phasicity and response to augmentation. Calf Veins: No evidence of thrombus. Normal compressibility and flow on color Doppler imaging. Superficial Great Saphenous Vein: No evidence of thrombus. Normal compressibility and flow on color Doppler imaging. Venous Reflux:  None. Other Findings:  Lower extremity edema is noted. LEFT LOWER EXTREMITY Common Femoral Vein: No evidence of thrombus. Normal compressibility, respiratory phasicity and response to augmentation. Saphenofemoral Junction: No evidence of thrombus. Normal compressibility and flow on color Doppler imaging. Profunda Femoral Vein: No evidence of thrombus. Normal compressibility and flow on color Doppler imaging. Femoral Vein: No evidence of thrombus. Normal compressibility, respiratory phasicity and response to augmentation. Popliteal Vein: No evidence of thrombus. Normal compressibility, respiratory phasicity and response to augmentation. Calf Veins: No evidence of thrombus. Normal compressibility and flow on color Doppler imaging. Superficial Great Saphenous Vein: No evidence of thrombus. Normal compressibility and flow on color Doppler  imaging. Venous Reflux:  None. Other Findings:  Lower extremity edema is noted. IMPRESSION: No evidence of deep venous thrombosis. Electronically Signed   By: Alcide Clever M.D.   On: 12/31/2022 18:49   ECHOCARDIOGRAM COMPLETE Result Date: 12/31/2022    ECHOCARDIOGRAM REPORT   Patient Name:   Justin Fleming Date of Exam: 12/30/2022 Medical Rec #:  [id number]        Height:       70.0 in Accession #:    [id number]       Weight:       218.0 lb Date of Birth:  October 24, 1960        BSA:          2.165 m Patient Age:    62 years         BP:           119/81 mmHg Patient Gender: M                HR:           94 bpm. [facility] Procedure: 2D Echo, Cardiac Doppler, Color Doppler and Intracardiac            Opacification Agent Indications:    Congestive Heart Failure I50.9  History:        Patient has no prior history of Echocardiogram examinations.                 Risk Factors:Hypertension and Current Smoker.  Sonographer:    Celesta Gentile RCS Referring Phys: [id number] [facility]  Sonographer Comments: Technically difficult study due to poor echo windows. IMPRESSIONS  1. Left ventricular ejection fraction, by estimation, is 55 to 60%. The left ventricle has normal function. The left ventricle has no regional wall motion abnormalities. There is mild concentric left ventricular hypertrophy. Left ventricular diastolic parameters are indeterminate.  2. Right ventricular systolic function was not well visualized. The right ventricular size is not well visualized. Tricuspid regurgitation signal is inadequate for assessing PA pressure.  3. Left atrial size was severely dilated.  4. Right atrial size was moderately dilated.  5. The mitral valve is grossly normal. Trivial mitral valve regurgitation.  6. The aortic valve was not well visualized. Aortic valve regurgitation is not visualized.  7. The inferior vena cava is dilated in size with <50% respiratory variability, suggesting right atrial pressure of 15  mmHg. Comparison(s): No prior Echocardiogram.  FINDINGS  Left Ventricle: Left ventricular ejection fraction, by estimation, is 55 to 60%. The left ventricle has normal function. The left ventricle has no regional wall motion abnormalities. Definity contrast agent was given IV to delineate the left ventricular  endocardial borders. The left ventricular internal cavity size was normal in size. There is mild concentric left ventricular hypertrophy. Left ventricular diastolic function could not be evaluated due to atrial fibrillation. Left ventricular diastolic parameters are indeterminate. Right Ventricle: The right ventricular size is not well visualized. Right vetricular wall thickness was not well visualized. Right ventricular systolic function was not well visualized. Tricuspid regurgitation signal is inadequate for assessing PA pressure. Left Atrium: Left atrial size was severely dilated. Right Atrium: Right atrial size was moderately dilated. Pericardium: There is no evidence of pericardial effusion. Mitral Valve: The mitral valve is grossly normal. Trivial mitral valve regurgitation. Tricuspid Valve: The tricuspid valve is grossly normal. Tricuspid valve regurgitation is trivial. Aortic Valve: The aortic valve was not well visualized. Aortic valve regurgitation is not visualized. Pulmonic Valve: The pulmonic valve was not well visualized. Pulmonic valve regurgitation is not visualized. Aorta: The aortic root is normal in size and structure. Venous: The inferior vena cava is dilated in size with less than 50% respiratory variability, suggesting right atrial pressure of 15 mmHg. IAS/Shunts: No atrial level shunt detected by color flow Doppler.  LEFT VENTRICLE PLAX 2D LVIDd:         3.40 cm LVIDs:         2.40 cm LV PW:         1.10 cm LV IVS:        1.10 cm LVOT diam:     1.80 cm LV SV:         44 LV SV Index:   20 LVOT Area:     2.54 cm  RIGHT VENTRICLE TAPSE (M-mode): 1.2 cm LEFT ATRIUM              Index         RIGHT ATRIUM           Index LA diam:        2.80 cm  1.29 cm/m   RA Area:     26.20 cm LA Vol (A2C):   115.0 ml 53.11 ml/m  RA Volume:   90.00 ml  41.56 ml/m LA Vol (A4C):   112.0 ml 51.72 ml/m LA Biplane Vol: 119.0 ml 54.96 ml/m  AORTIC VALVE LVOT Vmax:   97.50 cm/s LVOT Vmean:  72.800 cm/s LVOT VTI:    0.173 m  AORTA Ao Root diam: 3.10 cm MITRAL VALVE MV Area (PHT): 4.21 cm     SHUNTS MV Decel Time: 180 msec     Systemic VTI:  0.17 m MV E velocity: 127.00 cm/s  Systemic Diam: 1.80 cm Nona Dell MD Electronically signed by Nona Dell MD Signature Date/Time: 12/31/2022/1:36:31 PM    Final    DG CHEST PORT 1 VIEW Result Date: 12/30/2022 CLINICAL DATA:  PICC line placement. EXAM: PORTABLE CHEST 1 VIEW COMPARISON:  One-view chest x-ray 12/29/2022 FINDINGS: Heart is enlarged. Right pleural effusion remains. Mild pulmonary vascular congestion is present bilaterally. A new right-sided PICC line is in place. The tip is at the cavoatrial junction. No pneumothorax is present. The visualized soft tissues and bony thorax are otherwise unremarkable. IMPRESSION: 1. New right-sided PICC line with tip at the cavoatrial junction. 2. Cardiomegaly and mild pulmonary vascular congestion. 3. Persistent right pleural effusion. Electronically Signed  By: Marin Roberts M.D.   On: 12/30/2022 16:25   [place] EKG SITE RITE Result Date: 12/30/2022 If Site Rite image not attached, placement could not be confirmed due to current cardiac rhythm.   Cardiac Studies   Echo  12/31/22   1. Left ventricular ejection fraction, by estimation, is 55 to 60%. The  left ventricle has normal function. The left ventricle has no regional  wall motion abnormalities. There is mild concentric left ventricular  hypertrophy. Left ventricular diastolic  parameters are indeterminate.   2. Right ventricular systolic function was not well visualized. The right  ventricular size is not well visualized. Tricuspid regurgitation  signal is  inadequate for assessing PA pressure.   3. Left atrial size was severely dilated.   4. Right atrial size was moderately dilated.   5. The mitral valve is grossly normal. Trivial mitral valve  regurgitation.   6. The aortic valve was not well visualized. Aortic valve regurgitation  is not visualized.   7. The inferior vena cava is dilated in size with <50% respiratory  variability, suggesting right atrial pressure of 15 mmHg.   Comparison(s): No prior Echocardiogram.   Patient Profile     62 y.o. male  with hx of EtOH abuse   Asked to see re afib with RVR    Assessment & Plan    1  Afib with RVR  New   In setting of acute EtOH intoxicatin   Pt on IV diltiazem 15 mg /hour    Also on IV heparin Rates still not optimized  I would also increase lopressor to qid 12.5 mg  Add digoxin 0.5 IV x 1 then 0.25 dialy May need amiodarone   He probably wont convert since atria are enlarged    Long term concern for compliance and safety of oral anticoagulants in setting of EtOH use   Reviewed some with pt    Follow for now   2  Elevated troponin  Vey very mild , flat elevation  Most likely demand in setting of afib with RVR    3  Anemia   Hgb is 8.7 todsay    Down from 11.5 on admit   NO obvious bleedkin   4  EtOH abuse  Counselled on cutting back, quitting    For questions or updates, please contact  [facility] Please consult [email or web address] for contact info under        Signed, Dietrich Pates, MD  01/01/2023, 8:47 AM

## 2023-01-01 NOTE — Progress Notes (Signed)
PHARMACY - ANTICOAGULATION CONSULT NOTE  Pharmacy Consult for heparin  Indication: atrial fibrillation  No Known Allergies  Patient Measurements: Height: 5\' 10"  (177.8 cm) Weight: 101.5 kg (223 lb 12.3 oz) IBW/kg (Calculated) : 73 Heparin Dosing Weight: 93 kg  Vital Signs: Temp: 98.1 F (36.7 C) (12/27 0337) Temp Source: Oral (12/27 0337) BP: 92/66 (12/27 0530) Pulse Rate: 92 (12/27 0530)  Labs: Recent Labs    12/30/22 0001 12/30/22 0239 12/30/22 0425 12/30/22 0911 12/31/22 0048 12/31/22 0925 01/01/23 0456  HGB 11.5*  --  11.0*  --  9.0*  --  8.7*  HCT 34.3*  --  32.7*  --  26.8*  --  27.9*  PLT 203  --  189  --  156  --  113*  APTT  --   --   --   --   --   --  87*  HEPARINUNFRC  --   --   --    < > 0.37 0.30 >1.10*  CREATININE 1.31*  --  1.14  --  1.14  --  1.04  TROPONINIHS 39* 34*  --   --   --   --   --    < > = values in this interval not displayed.    Estimated Creatinine Clearance: 85.7 mL/min (by C-G formula based on SCr of 1.04 mg/dL).  Assessment: Justin Fleming is a 62 y.o. year old male admitted on 12/29/2022 with concern for new onset afib with RVR. Currently on diltiazem drip for rate control. No anticoagulation prior to admission. Pharmacy consulted to transition from heparin to apixaban  Update: cards wants patient to remain on heparin infusion due to drop in Hgb and need for additional invasive procedures- he received one dose of Eliquis    Goal of Therapy:  Heparin level 0.3-0.7 units/ml Monitor platelets by anticoagulation protocol: Yes   Plan: Continue heparin infusion @ 1750 units/hr Confirmatory aPTT in 6 hours Continue to monitor H&H and platelets   Thank you for allowing pharmacy to participate in this patient's care.  Will M. Dareen Piano, PharmD Clinical Pharmacist 01/01/2023 8:10 AM
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
## 2023-01-01 NOTE — Plan of Care (Signed)
  Problem: Acute Rehab PT Goals(only PT should resolve) Goal: Pt Will Go Supine/Side To Sit Outcome: Progressing Flowsheets (Taken 01/01/2023 1440) Pt will go Supine/Side to Sit:  with modified independence  with supervision Goal: Patient Will Transfer Sit To/From Stand Outcome: Progressing Flowsheets (Taken 01/01/2023 1440) Patient will transfer sit to/from stand: with supervision Goal: Pt Will Transfer Bed To Chair/Chair To Bed Outcome: Progressing Flowsheets (Taken 01/01/2023 1440) Pt will Transfer Bed to Chair/Chair to Bed: with supervision Goal: Pt Will Ambulate Outcome: Progressing Flowsheets (Taken 01/01/2023 1440) Pt will Ambulate:  50 feet  with contact guard assist  with rolling walker   2:40 PM, 01/01/23 Ocie Bob, MPT Physical Therapist with [facility] [phone] office [phone] mobile phone

## 2023-01-01 NOTE — Progress Notes (Addendum)
PHARMACY - ANTICOAGULATION CONSULT NOTE  Pharmacy Consult for heparin  Indication: atrial fibrillation  No Known Allergies  Patient Measurements: Height: 5\' 10"  (177.8 cm) Weight: 101.5 kg (223 lb 12.3 oz) IBW/kg (Calculated) : 73 Heparin Dosing Weight: 93 kg  Vital Signs: Temp: 98.1 F (36.7 C) (12/27 1154) Temp Source: Axillary (12/27 1154) BP: 108/74 (12/27 1003) Pulse Rate: 99 (12/27 1003)  Labs: Recent Labs    12/30/22 0001 12/30/22 0239 12/30/22 0425 12/30/22 0911 12/31/22 0048 12/31/22 0925 01/01/23 0456 01/01/23 1202  HGB 11.5*  --  11.0*  --  9.0*  --  8.7*  --   HCT 34.3*  --  32.7*  --  26.8*  --  27.9*  --   PLT 203  --  189  --  156  --  113*  --   APTT  --   --   --   --   --   --  87* 82*  HEPARINUNFRC  --   --   --    < > 0.37 0.30 >1.10* 1.07*  CREATININE 1.31*  --  1.14  --  1.14  --  1.04  --   TROPONINIHS 39* 34*  --   --   --   --   --   --    < > = values in this interval not displayed.    Estimated Creatinine Clearance: 85.7 mL/min (by C-G formula based on SCr of 1.04 mg/dL).  Assessment: Justin Fleming is a 62 y.o. year old male admitted on 12/29/2022 with concern for new onset afib with RVR. Currently on diltiazem drip for rate control. No anticoagulation prior to admission. Pharmacy consulted to transition from heparin to apixaban  Update: cards wants patient to remain on heparin infusion due to drop in Hgb and need for additional invasive procedures- he received one dose of Eliquis    Goal of Therapy:  Heparin level 0.3-0.7 units/ml aPTT 66-102 seconds Monitor platelets by anticoagulation protocol: Yes   Plan: aPTT therapeutic x 2 Continue heparin infusion @ 1750 units/hr Check aPTT and heparin level next with AM labs Heparin level beginning to trend down, away from false elevation as Eliquis is washing out. Anticipating that we can titrate by heparin level moving forward. Look for correlation with next check. Continue to  monitor H&H and platelets   Thank you for allowing pharmacy to participate in this patient's care.  Will M. Dareen Piano, PharmD Clinical Pharmacist 01/01/2023 1:05 PM

## 2023-01-01 NOTE — Progress Notes (Signed)
Was able to get patient up to [facility] to have a bowel movement. Patient has +2/+3 lower extremity, scrotal, and penile swelling. Patient has coarse crackles with wheezing in both lungs and experiencing increased shortness of breath with exertion. Patient appears very weak and fatigued. Patient's weight has increased by 2.5kg since admission. Dr. Marisa Severin notified of morning assessment findings. Patient placed back to bed, call bell within reach.

## 2023-01-01 NOTE — TOC Progression Note (Addendum)
Transition of Care [facility]) - Progression Note    Patient Details  Name: Justin Fleming MRN: [id number] Date of Birth: August 06, 1960  Transition of [facility] CM/SW Contact  Leitha Bleak, RN Phone Number: 01/01/2023, 3:17 PM  Clinical Narrative:   PT is recommending SNF. CM at bedside to speak with his sister. He has 5 sisters, they have tried to get him to apply for insurance. We talked about [facility] program. No referred made at this time, due to the needs for SNF. CM called [facility], he was not on their list, but they will add patient to the list and screen him for medicaid. TOC ask about Medicare with his age. They will follow up. TOC following to see if patient will DC home with [facility]. Discussed with MD, he will be here all weekend, very sick, TOC following to see if he could be medicaid pending and will send out for bed offer when stable. Sister unsure if he will be agreeable when awake and better.   Substance abuse and [facility] papers given to his sister.      Barriers to Discharge: Continued Medical Work up  Expected Discharge Plan and Services       Living arrangements for the past 2 months: Single Family Home                   Social Determinants of Health (SDOH) Interventions SDOH Screenings   Food Insecurity: No Food Insecurity (12/30/2022)  Housing: Low Risk  (12/30/2022)  Transportation Needs: No Transportation Needs (12/30/2022)  Utilities: Not At Risk (12/30/2022)  Alcohol Screen: Low Risk  (02/02/2018)  Depression (PHQ2-9): Low Risk  (02/02/2018)  Tobacco Use: Medium Risk (12/30/2022)    Readmission Risk Interventions     No data to display

## 2023-01-01 NOTE — Evaluation (Signed)
Physical Therapy Evaluation Patient Details Name: Justin Fleming MRN: [id number] DOB: [birth year] Today's Date: 01/01/2023  History of Present Illness  History limited due to patient's intoxication/AMS, obtained by patient, chart review, EDP  Justin Fleming is a 62 y.o. male with hx of alcohol abuse, hypertension, mood disorder, smoking, GERD, who was brought in after bystander contacted EMS because he was sitting in his car in the parking lot.  Per triage found in his car with no heat on holding a bottle of liquor.  Initially was unable to provide any history.  At time of my interview appears more awake/interactive compared to ED evaluation.  He reports ongoing heavy alcohol use possibly as much is 1.75 L/day of liquor.  Denies any recent complaints.  However on review of systems reports bilateral lower extremity swelling, intermittent heart racing.  Denies any recent or active chest pain.  No recent fever/chills, cough/cold, abdominal pain, nausea, vomiting, diarrhea, dysuria, rashes, headache, focal numbness or weakness.  Denies any known history of A-fib   Clinical Impression  Patient demonstrates slow labored movement for sitting up at bedside, had most difficulty completing sit to stand from commode due to weakness, able to take a few steps in room without loss of balance, but limited mostly due to c/o fatigue and generalized weakness.  Patient tolerated sitting up in chair after therapy - RN aware. Patient will benefit from continued skilled physical therapy in hospital and recommended venue below to increase strength, balance, endurance for safe ADLs and gait.           If plan is discharge home, recommend the following: A lot of help with walking and/or transfers;A little help with bathing/dressing/bathroom;Help with stairs or ramp for entrance;Assistance with cooking/housework   Can travel by private vehicle   Yes    Equipment Recommendations Rolling walker (2 wheels);BSC/3in1   Recommendations for Other Services       Functional Status Assessment Patient has had a recent decline in their functional status and demonstrates the ability to make significant improvements in function in a reasonable and predictable amount of time.     Precautions / Restrictions Precautions Precautions: Fall Restrictions Weight Bearing Restrictions Per Provider Order: No      Mobility  Bed Mobility Overal bed mobility: Needs Assistance Bed Mobility: Supine to Sit     Supine to sit: Contact guard, Min assist     General bed mobility comments: increased time, labored movement    Transfers Overall transfer level: Needs assistance Equipment used: Rolling walker (2 wheels) Transfers: Sit to/from Stand, Bed to chair/wheelchair/BSC Sit to Stand: Min assist, Mod assist   Step pivot transfers: Min assist, Mod assist       General transfer comment: unsteady labored movement    Ambulation/Gait Ambulation/Gait assistance: Min assist, Mod assist Gait Distance (Feet): 15 Feet Assistive device: Rolling walker (2 wheels) Gait Pattern/deviations: Decreased step length - right, Decreased step length - left, Decreased stride length Gait velocity: slow     General Gait Details: slow labored unsteady movement, limited mostly due to c/o fatigue and generalized weakness  Stairs            Wheelchair Mobility     Tilt Bed    Modified Rankin (Stroke Patients Only)       Balance Overall balance assessment: Needs assistance Sitting-balance support: Feet supported, No upper extremity supported Sitting balance-Leahy Scale: Fair Sitting balance - Comments: fair/good seated at EOB   Standing balance support: During functional activity, No upper  extremity supported Standing balance-Leahy Scale: Poor Standing balance comment: fair using RW                             Pertinent Vitals/Pain Pain Assessment Pain Assessment: No/denies pain    Home Living  Family/patient expects to be discharged to:: Private residence Living Arrangements: Alone Available Help at Discharge: Family;Available PRN/intermittently Type of Home: House Home Access: Stairs to enter Entrance Stairs-Rails: None Entrance Stairs-Number of Steps: 2   Home Layout: One level Home Equipment: Grab bars - tub/shower;None      Prior Function Prior Level of Function : Independent/Modified Independent;Driving             Mobility Comments: Community ambulation without AD, drives, shops ADLs Comments: Independent     Extremity/Trunk Assessment   Upper Extremity Assessment Upper Extremity Assessment: Generalized weakness    Lower Extremity Assessment Lower Extremity Assessment: Generalized weakness    Cervical / Trunk Assessment Cervical / Trunk Assessment: Normal  Communication   Communication Communication: No apparent difficulties  Cognition Arousal: Alert Behavior During Therapy: WFL for tasks assessed/performed Overall Cognitive Status: Within Functional Limits for tasks assessed                                          General Comments      Exercises     Assessment/Plan    PT Assessment Patient needs continued PT services  PT Problem List Decreased strength;Decreased activity tolerance;Decreased balance;Decreased mobility       PT Treatment Interventions DME instruction;Gait training;Stair training;Functional mobility training;Therapeutic activities;Therapeutic exercise;Balance training;Patient/family education    PT Goals (Current goals can be found in the Care Plan section)  Acute Rehab PT Goals Patient Stated Goal: return home with family to assist PT Goal Formulation: With patient Time For Goal Achievement: 01/15/23 Potential to Achieve Goals: Good    Frequency Min 3X/week     Co-evaluation               AM-PAC PT "6 Clicks" Mobility  Outcome Measure Help needed turning from your back to your side while  in a flat bed without using bedrails?: A Little Help needed moving from lying on your back to sitting on the side of a flat bed without using bedrails?: A Little Help needed moving to and from a bed to a chair (including a wheelchair)?: A Little Help needed standing up from a chair using your arms (e.g., wheelchair or bedside chair)?: A Lot Help needed to walk in hospital room?: A Little Help needed climbing 3-5 steps with a railing? : A Lot 6 Click Score: 16    End of Session   Activity Tolerance: Patient tolerated treatment well;Patient limited by fatigue Patient left: in chair;with call bell/phone within reach Nurse Communication: Mobility status PT Visit Diagnosis: Unsteadiness on feet (R26.81);Other abnormalities of gait and mobility (R26.89);Muscle weakness (generalized) (M62.81)    Time: 9604-5409 PT Time Calculation (min) (ACUTE ONLY): 35 min   Charges:   PT Evaluation $PT Eval Moderate Complexity: 1 Mod PT Treatments $Therapeutic Activity: 23-37 mins PT General Charges $$ ACUTE PT VISIT: 1 Visit         2:39 PM, 01/01/23 Ocie Bob, MPT Physical Therapist with [facility] [phone] office [phone] mobile phone

## 2023-01-02 DIAGNOSIS — I4819 Other persistent atrial fibrillation: Secondary | ICD-10-CM

## 2023-01-02 DIAGNOSIS — D62 Acute posthemorrhagic anemia: Secondary | ICD-10-CM

## 2023-01-02 LAB — CBC
HCT: 26.9 % — ABNORMAL LOW (ref 39.0–52.0)
Hemoglobin: 8.4 g/dL — ABNORMAL LOW (ref 13.0–17.0)
MCH: 34.3 pg — ABNORMAL HIGH (ref 26.0–34.0)
MCHC: 31.2 g/dL (ref 30.0–36.0)
MCV: 109.8 fL — ABNORMAL HIGH (ref 80.0–100.0)
Platelets: 104 10*3/uL — ABNORMAL LOW (ref 150–400)
RBC: 2.45 MIL/uL — ABNORMAL LOW (ref 4.22–5.81)
RDW: 19.6 % — ABNORMAL HIGH (ref 11.5–15.5)
WBC: 8.4 10*3/uL (ref 4.0–10.5)
nRBC: 0.4 % — ABNORMAL HIGH (ref 0.0–0.2)

## 2023-01-02 LAB — IRON AND TIBC
Iron: 13 ug/dL — ABNORMAL LOW (ref 45–182)
Saturation Ratios: 7 % — ABNORMAL LOW (ref 17.9–39.5)
TIBC: 191 ug/dL — ABNORMAL LOW (ref 250–450)
UIBC: 178 ug/dL

## 2023-01-02 LAB — MAGNESIUM: Magnesium: 1.7 mg/dL (ref 1.7–2.4)

## 2023-01-02 LAB — BASIC METABOLIC PANEL
Anion gap: 7 (ref 5–15)
BUN: 14 mg/dL (ref 8–23)
CO2: 29 mmol/L (ref 22–32)
Calcium: 8.3 mg/dL — ABNORMAL LOW (ref 8.9–10.3)
Chloride: 100 mmol/L (ref 98–111)
Creatinine, Ser: 1.06 mg/dL (ref 0.61–1.24)
GFR, Estimated: >60 mL/min (ref >60)
Glucose, Bld: 119 mg/dL — ABNORMAL HIGH (ref 70–99)
Potassium: 3.3 mmol/L — ABNORMAL LOW (ref 3.5–5.1)
Sodium: 136 mmol/L (ref 135–145)

## 2023-01-02 LAB — APTT: aPTT: 81 s — ABNORMAL HIGH (ref 24–36)

## 2023-01-02 LAB — FERRITIN: Ferritin: 380 ng/mL — ABNORMAL HIGH (ref 24–336)

## 2023-01-02 LAB — HEPARIN LEVEL (UNFRACTIONATED): Heparin Unfractionated: 0.46 [IU]/mL (ref 0.30–0.70)

## 2023-01-02 MED ORDER — LORAZEPAM 2 MG/ML IJ SOLN: 1.0000 mg | INTRAMUSCULAR | Status: AC | PRN

## 2023-01-02 MED ORDER — POTASSIUM CHLORIDE CRYS ER 20 MEQ PO TBCR
40.0000 meq | EXTENDED_RELEASE_TABLET | Freq: Once | ORAL | Status: AC
  Administered 2023-01-02: 40 meq via ORAL
  Filled 2023-01-02: qty 2

## 2023-01-02 MED ORDER — FERROUS SULFATE 325 (65 FE) MG PO TABS
325.0000 mg | ORAL_TABLET | Freq: Every day | ORAL | Status: DC
  Administered 2023-01-03 – 2023-01-06 (×4): 325 mg via ORAL
  Filled 2023-01-02 (×4): qty 1

## 2023-01-02 MED ORDER — POTASSIUM CHLORIDE CRYS ER 20 MEQ PO TBCR
40.0000 meq | EXTENDED_RELEASE_TABLET | ORAL | Status: AC
  Administered 2023-01-02 (×2): 40 meq via ORAL
  Filled 2023-01-02 (×2): qty 2

## 2023-01-02 MED ORDER — MAGNESIUM SULFATE 2 GM/50ML IV SOLN
2.0000 g | Freq: Once | INTRAVENOUS | Status: AC
  Administered 2023-01-02: 2 g via INTRAVENOUS
  Filled 2023-01-02: qty 50

## 2023-01-02 MED ORDER — LORAZEPAM 1 MG PO TABS: 1.0000 mg | ORAL_TABLET | ORAL | Status: AC | PRN

## 2023-01-02 NOTE — Plan of Care (Signed)

## 2023-01-02 NOTE — Progress Notes (Signed)
PROGRESS NOTE    Patient: Justin Fleming                            PCP: Patient, No Pcp Per                    DOB: [birth year]            DOA: 12/29/2022 WUJ:[id number]             DOS: 01/02/2023, 4:30 PM   LOS: 3 days   Date of Service: The patient was seen and examined on 01/02/2023  Subjective:   -Sisters at bedside, questions answered -More coherent Dyspnea improving   Brief Narrative:   History limited due to patient's intoxication/AMS, obtained by patient, chart review, EDP Justin Fleming is a 62 y.o. male with hx of alcohol abuse, hypertension, mood disorder, smoking, GERD, who was brought in after bystander contacted EMS because he was sitting in his car in the parking lot.   Found have alcohol intoxication, A-fib with RVR, suspected aspiration pneumonia, and worsening lactic acidosis with fluids.  Clinically suspect decompensated heart failure.  Denies any known history of A-fib.   EKG:  A-fib with RVR rate 159, LAD, LAFB, inferior Q's, poor R wave progression, T wave inversion laterally, no overt ischemic changes.    ED Course:  Found to be in A-fib with RVR, initially treated with metoprolol 2.5 mg IV x 1 with slight worsening of pressures to systolic in the 90s.  Started on heparin for anticoagulation.  Treated with ceftriaxone/azithromycin, 2 L IV fluid, potassium 40 mg IV and mag 4 g.  At my request consulted with cardiology fellow re: rate control options.  Cards recommending possibly metoprolol IV prn or digoxin, recommend against amiodarone.     Assessment/Plan:  1)Alcohol intoxication/DTs -Reports drinking up to 1.75 L of liquor a day -- Librium taper, , continue folic acid thiamine and multivitamin  - 2)A-fib with RVR -new onset -New onset  in the setting of alcohol intoxication -CHA2DS2-VASc is at least 1 (HTN) -Echo from 12/31/2022 with EF of 55 to 60%, bilateral atrial enlargement noted, no significant valvular abnormalities -Cardiology consult  appreciated --TSH -normal at 2.7 -Potassium and magnesium WNL -Continue IV heparin for anticoagulation -Rate control remains challenging -Continue IV Cardizem for rate control--as BP allows -Cardiologist recommends titrating of metoprolol and adding digoxin  3)HFpEF --acute diastolic dysfunction CHF-echo as noted above #2 -Clinically appears volume overloaded--+ve JVD, significant peripheral edema including edema of the genitalia -Chest x-ray on admission on 12/30/2022  showed pulmonary venous congestion and right sided pleural effusion -CTA on 12/31/2022 with bilateral pleural effusions -c/n IV Lasix as BP allows, repeat chest x-ray on 01/03/2023 -Daily weights, fluid input and output monitoring as ordered REDs Vest  4)Acute hypoxic respiratory failure--- due to #2 and  #3 above ?? Doubt significant PNA---- -No leukocytosis, No fevers, PCT Negative -Lactic acidosis improved with hydration -Blood cultures NGTD -Patient was previously started on Rocephin--currently completing same -No Further antibiotics indicated -Frank Sepsis ruled out  5)Acute metabolic encephalopathy-----due to above -CT Head with mild diffuse atrophy, no acute abnormalities.   -Mentation is improving  6)Acute myocardial injury -Ischemic demand due to A-fib with RVR -Troponin 39 >> 34, -  EKG is A-fib with RVR, nonspecific changes without overt ischemic changes. -Management of alcohol withdrawal, A-fib RVR, heart failure, borderline hypotension, aspiration pneumonia per above. -Echo from 12/31/2022 with preserved EF, without wall  motion abnormalities -Appreciate cardiology recommendations   7)-acute anemia with heme positive stool--- ??  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--- anemia workup consistent with iron deficiency, B12 and folate deficiency as well -Appears macrocytic and hyperchromic - Folate 3.6, B12 -158----B12 and folic acid replacement initiated -- Iron 13, saturation 7% --- give iron  replacement Hgb trended down from 11.5 on admission down to 8.4 at this time -PPI ordered -GI consult appreciated -Defer timing of possible EGD and colonoscopy to GI team (colonoscopy was in 2016, no prior EGD)  8)AKI----acute kidney injury---due to dehydration -  creatinine on admission= 1.31 , - baseline creatinine =1.0     -Creatinine has normalized  -renally adjust medications, avoid nephrotoxic agents / dehydration  / hypotension  9)Social/Ethics--- prior to admission lived alone, admits to heavy alcohol abuse -Patient Sister Nicole Cella is primary contact  ----------------------------------------------------------------------------------------------------------------------------------------------- Nutritional status:  The patient's BMI is: Body mass index is 32.14 kg/m. I agree with the assessment and plan as outlined below: Nutrition Status:  DVT prophylaxis:  Heparin drip  Code Status:   Code Status: Full Code  Family Communication: Discussed with sister  Admission status:    Inpatient  Disposition: From  - home             Planning for discharge to SNF rehab Vs Home with [facility] Procedures:   Antimicrobials:  Anti-infectives (From admission, onward)    Start     Dose/Rate Route Frequency Ordered Stop   12/31/22 0000  cefTRIAXone (ROCEPHIN) 2 g in sodium chloride 0.9 % 100 mL IVPB        2 g 200 mL/hr over 30 Minutes Intravenous Every 24 hours 12/30/22 0443 01/02/23 0013   12/30/22 2200  azithromycin (ZITHROMAX) tablet 500 mg        500 mg Oral Daily at bedtime 12/30/22 0443 12/31/22 2112   12/30/22 0000  cefTRIAXone (ROCEPHIN) 2 g in sodium chloride 0.9 % 100 mL IVPB        2 g 200 mL/hr over 30 Minutes Intravenous  Once 12/29/22 2345 12/30/22 0100   12/30/22 0000  azithromycin (ZITHROMAX) 500 mg in sodium chloride 0.9 % 250 mL IVPB        500 mg 250 mL/hr over 60 Minutes Intravenous  Once 12/29/22 2345 12/30/22 0158      Medication:   chlordiazePOXIDE  25 mg Oral  Q12H   Followed by   Melene Muller ON 01/03/2023] chlordiazePOXIDE  25 mg Oral Once   Chlorhexidine Gluconate Cloth  6 each Topical Q0600   cyanocobalamin  1,000 mcg Intramuscular Once   vitamin B-12  1,000 mcg Oral Daily   digoxin  0.25 mg Oral Daily   folic acid  1 mg Oral Daily   furosemide  40 mg Intravenous Q12H   metoprolol tartrate  12.5 mg Oral QID   multivitamin with minerals  1 tablet Oral Daily   pantoprazole (PROTONIX) IV  40 mg Intravenous Q12H   polyethylene glycol  17 g Oral Daily   sodium chloride flush  10-40 mL Intracatheter Q12H   sodium chloride flush  3 mL Intravenous Q12H   sucralfate  1 g Oral TID WC & HS   LORazepam **OR** LORazepam, mouth rinse, sodium chloride flush  Objective:   Vitals:   01/02/23 1030 01/02/23 1129 01/02/23 1221 01/02/23 1546  BP: 96/72  102/71   Pulse:   (!) 102   Resp: 13     Temp:  99.1 F (37.3 C)  98.8 F (37.1 C)  TempSrc:  Oral  Oral  SpO2: 97%     Weight:      Height:        Intake/Output Summary (Last 24 hours) at 01/02/2023 1630 Last data filed at 01/02/2023 1340 Gross per 24 hour  Intake 1291.66 ml  Output 1875 ml  Net -583.34 ml   Filed Weights   12/31/22 0448 01/01/23 0500 01/02/23 0500  Weight: 98.9 kg 101.5 kg 101.6 kg    Physical examination:    Physical Exam Gen:- Awake Alert, mild conversational dyspnea HEENT:- [place].AT, No sclera icterus Nose- [place] 2L/min Neck-Supple Neck,  +ve JVD,.  Lungs- Diminished breath sounds with scattered rales bibasilarly CV- S1, S2 normal, RRR Abd-  +ve B.Sounds, Abd Soft, No tenderness,    Extremity/Skin:- +ve  edema, good pedal pulses  Psych-affect is appropriate, oriented x3 Neuro-generalized weakness, no new focal deficits, mild tremors GU-Penile and Scrotal Edema Wounds: Please see nursing documentation  LABs:     Latest Ref Rng & Units 01/02/2023    5:05 AM 01/01/2023    4:56 AM 12/31/2022   12:48 AM  CBC  WBC 4.0 - 10.5 K/uL 8.4  8.6  6.7   Hemoglobin 13.0 -  17.0 g/dL 8.4  8.7  9.0   Hematocrit 39.0 - 52.0 % 26.9  27.9  26.8   Platelets 150 - 400 K/uL 104  113  156       Latest Ref Rng & Units 01/02/2023    5:05 AM 01/01/2023    4:56 AM 12/31/2022   12:48 AM  CMP  Glucose 70 - 99 mg/dL 242  353  614   BUN 8 - 23 mg/dL 14  17  15    Creatinine 0.61 - 1.24 mg/dL 4.31  5.40  0.86   Sodium 135 - 145 mmol/L 136  137  140   Potassium 3.5 - 5.1 mmol/L 3.3  3.9  3.2   Chloride 98 - 111 mmol/L 100  100  100   CO2 22 - 32 mmol/L 29  29  26    Calcium 8.9 - 10.3 mg/dL 8.3  8.5  8.0    Micro Results Recent Results (from the past 240 hours)  Culture, blood (routine x 2)     Status: None (Preliminary result)   Collection Time: 12/30/22 12:51 AM   Specimen: BLOOD  Result Value Ref Range Status   Specimen Description BLOOD BLOOD RIGHT ARM  Final   Special Requests   Final    BOTTLES DRAWN AEROBIC ONLY Blood Culture results may not be optimal due to an inadequate volume of blood received in culture bottles   Culture   Final    NO GROWTH 3 DAYS Performed at [facility], [address] [place], [place] [address]    Report Status PENDING  Incomplete  Culture, blood (routine x 2)     Status: None (Preliminary result)   Collection Time: 12/30/22 12:55 AM   Specimen: BLOOD LEFT FOREARM  Result Value Ref Range Status   Specimen Description BLOOD LEFT FOREARM  Final   Special Requests   Final    BOTTLES DRAWN AEROBIC AND ANAEROBIC Blood Culture results may not be optimal due to an inadequate volume of blood received in culture bottles   Culture   Final    NO GROWTH 3 DAYS Performed at [facility], [address], [place], [place] [address]    Report Status PENDING  Incomplete  Respiratory (~20 pathogens) panel by PCR     Status: None  Collection Time: 12/30/22  3:59 AM   Specimen: Nasopharyngeal Swab; Respiratory  Result Value Ref Range Status   Adenovirus NOT DETECTED NOT DETECTED Final   Coronavirus 229E NOT DETECTED NOT DETECTED Final     Comment: (NOTE) The Coronavirus on the Respiratory Panel, DOES NOT test for the novel  Coronavirus (2019 nCoV)    Coronavirus HKU1 NOT DETECTED NOT DETECTED Final   Coronavirus NL63 NOT DETECTED NOT DETECTED Final   Coronavirus OC43 NOT DETECTED NOT DETECTED Final   Metapneumovirus NOT DETECTED NOT DETECTED Final   Rhinovirus / Enterovirus NOT DETECTED NOT DETECTED Final   Influenza A NOT DETECTED NOT DETECTED Final   Influenza B NOT DETECTED NOT DETECTED Final   Parainfluenza Virus 1 NOT DETECTED NOT DETECTED Final   Parainfluenza Virus 2 NOT DETECTED NOT DETECTED Final   Parainfluenza Virus 3 NOT DETECTED NOT DETECTED Final   Parainfluenza Virus 4 NOT DETECTED NOT DETECTED Final   Respiratory Syncytial Virus NOT DETECTED NOT DETECTED Final   Bordetella pertussis NOT DETECTED NOT DETECTED Final   Bordetella Parapertussis NOT DETECTED NOT DETECTED Final   Chlamydophila pneumoniae NOT DETECTED NOT DETECTED Final   Mycoplasma pneumoniae NOT DETECTED NOT DETECTED Final    Comment: Performed at [facility], [address] [place], [place] [address]  MRSA Next Gen by PCR, Nasal     Status: None   Collection Time: 12/30/22  9:06 AM   Specimen: Nasal Mucosa; Nasal Swab  Result Value Ref Range Status   MRSA by PCR Next Gen NOT DETECTED NOT DETECTED Final    Comment: (NOTE) The GeneXpert MRSA Assay (FDA approved for NASAL specimens only), is one component of a comprehensive MRSA colonization surveillance program. It is not intended to diagnose MRSA infection nor to guide or monitor treatment for MRSA infections. Test performance is not FDA approved in patients less than 90 years old. Performed at [facility], [address] [place], [place] [address]   SARS Coronavirus 2 by RT PCR (hospital order, performed in [facility] hospital lab) *cepheid single result test* Anterior Nasal Swab     Status: None   Collection Time: 12/31/22 12:31 PM   Specimen: Anterior Nasal Swab  Result  Value Ref Range Status   SARS Coronavirus 2 by RT PCR NEGATIVE NEGATIVE Final    Comment: (NOTE) SARS-CoV-2 target nucleic acids are NOT DETECTED.  The SARS-CoV-2 RNA is generally detectable in upper and lower respiratory specimens during the acute phase of infection. The lowest concentration of SARS-CoV-2 viral copies this assay can detect is 250 copies / mL. A negative result does not preclude SARS-CoV-2 infection and should not be used as the sole basis for treatment or other patient management decisions.  A negative result may occur with improper specimen collection / handling, submission of specimen other than nasopharyngeal swab, presence of viral mutation(s) within the areas targeted by this assay, and inadequate number of viral copies (<250 copies / mL). A negative result must be combined with clinical observations, patient history, and epidemiological information.  Fact Sheet for Patients:   RoadLapTop.co.za  Fact Sheet for Healthcare Providers: http://kim-miller.com/  This test is not yet approved or  cleared by the [place] FDA and has been authorized for detection and/or diagnosis of SARS-CoV-2 by FDA under an Emergency Use Authorization (EUA).  This EUA will remain in effect (meaning this test can be used) for the duration of the COVID-19 declaration under Section 564(b)(1) of the Act, 21 U.S.C. section 360bbb-3(b)(1), unless  the authorization is terminated or revoked sooner.  Performed at [facility], [address] [place], [place] [address]    SIGNED: Shon Hale, MD,  - [facility],  Pager (please use [email or web address] to page/ text) Please use Epic Secure Chat for non-urgent communication (7AM-7PM)  If 7PM-7AM, please contact night-coverage [email or web address], 01/02/2023, 4:30 PM

## 2023-01-02 NOTE — Progress Notes (Signed)
Dr. Marisa Severin at bedside. Pt's pressure a little soft 86/62 (69). Verbal order to hold cardizem drip for one hour and reassess. It is now off.

## 2023-01-02 NOTE — Progress Notes (Addendum)
PHARMACY - ANTICOAGULATION CONSULT NOTE  Pharmacy Consult for heparin  Indication: atrial fibrillation  No Known Allergies  Patient Measurements: Height: 5\' 10"  (177.8 cm) Weight: 101.6 kg (223 lb 15.8 oz) IBW/kg (Calculated) : 73 Heparin Dosing Weight: 93 kg  Vital Signs: Temp: 99.1 F (37.3 C) (12/28 1129) Temp Source: Oral (12/28 1129) BP: 96/72 (12/28 1030) Pulse Rate: 84 (12/28 0930)  Labs: Recent Labs    12/31/22 0048 12/31/22 0925 01/01/23 0456 01/01/23 1202 01/02/23 0505  HGB 9.0*  --  8.7*  --  8.4*  HCT 26.8*  --  27.9*  --  26.9*  PLT 156  --  113*  --  104*  APTT  --   --  87* 82* 81*  HEPARINUNFRC 0.37   < > >1.10* 1.07* 0.46  CREATININE 1.14  --  1.04  --  1.06   < > = values in this interval not displayed.    Estimated Creatinine Clearance: 84 mL/min (by C-G formula based on SCr of 1.06 mg/dL).  Assessment: Justin Fleming is a 62 y.o. year old male admitted on 12/29/2022 with concern for new onset afib with RVR. Currently on diltiazem drip for rate control. No anticoagulation prior to admission. Pharmacy initially consulted to transition from heparin to apixaban, but then relented with drop in Hgb and the possibility of additional intervention. He had received one dose of Eliquis at the time of re-initiating heparin. CHA2DS2VASc is at least 2 (HTN, HFpEF).   Goal of Therapy:  Heparin level 0.3-0.7 units/ml aPTT 66-102 seconds Monitor platelets by anticoagulation protocol: Yes   Plan: aPTT and HL now correlating. Will monitor drip via HL going forward. Continue heparin infusion @ 1750 units/hr Check heparin level next with tomorrow AM labs Continue to monitor H&H and platelets   Thank you for allowing pharmacy to participate in this patient's care.  Will M. Dareen Piano, PharmD Clinical Pharmacist 01/02/2023 11:44 AM

## 2023-01-03 ENCOUNTER — Inpatient Hospital Stay (HOSPITAL_COMMUNITY): Payer: Medicare Other

## 2023-01-03 DIAGNOSIS — E538 Deficiency of other specified B group vitamins: Secondary | ICD-10-CM

## 2023-01-03 DIAGNOSIS — R195 Other fecal abnormalities: Secondary | ICD-10-CM

## 2023-01-03 DIAGNOSIS — F10921 Alcohol use, unspecified with intoxication delirium: Secondary | ICD-10-CM

## 2023-01-03 LAB — CBC
HCT: 26.3 % — ABNORMAL LOW (ref 39.0–52.0)
Hemoglobin: 8.5 g/dL — ABNORMAL LOW (ref 13.0–17.0)
MCH: 35.6 pg — ABNORMAL HIGH (ref 26.0–34.0)
MCHC: 32.3 g/dL (ref 30.0–36.0)
MCV: 110 fL — ABNORMAL HIGH (ref 80.0–100.0)
Platelets: 102 10*3/uL — ABNORMAL LOW (ref 150–400)
RBC: 2.39 MIL/uL — ABNORMAL LOW (ref 4.22–5.81)
RDW: 19.6 % — ABNORMAL HIGH (ref 11.5–15.5)
WBC: 8.6 10*3/uL (ref 4.0–10.5)
nRBC: 0 % (ref 0.0–0.2)

## 2023-01-03 LAB — BASIC METABOLIC PANEL
Anion gap: 6 (ref 5–15)
BUN: 15 mg/dL (ref 8–23)
CO2: 31 mmol/L (ref 22–32)
Calcium: 8.4 mg/dL — ABNORMAL LOW (ref 8.9–10.3)
Chloride: 102 mmol/L (ref 98–111)
Creatinine, Ser: 1.04 mg/dL (ref 0.61–1.24)
GFR, Estimated: >60 mL/min (ref >60)
Glucose, Bld: 124 mg/dL — ABNORMAL HIGH (ref 70–99)
Potassium: 4.3 mmol/L (ref 3.5–5.1)
Sodium: 139 mmol/L (ref 135–145)

## 2023-01-03 LAB — HEPARIN LEVEL (UNFRACTIONATED)
Heparin Unfractionated: 0.2 [IU]/mL — ABNORMAL LOW (ref 0.30–0.70)
Heparin Unfractionated: 0.3 [IU]/mL (ref 0.30–0.70)
Heparin Unfractionated: 0.27 [IU]/mL — ABNORMAL LOW (ref 0.30–0.70)

## 2023-01-03 MED ORDER — HEPARIN BOLUS VIA INFUSION
1500.0000 [IU] | Freq: Once | INTRAVENOUS | Status: AC
  Administered 2023-01-03: 1500 [IU] via INTRAVENOUS
  Filled 2023-01-03: qty 1500

## 2023-01-03 MED ORDER — HEPARIN BOLUS VIA INFUSION
1400.0000 [IU] | Freq: Once | INTRAVENOUS | Status: AC
Start: 2023-01-03 — End: 2023-01-03
  Administered 2023-01-03: 1400 [IU] via INTRAVENOUS
  Filled 2023-01-03: qty 1400

## 2023-01-03 MED ORDER — OXYCODONE HCL 5 MG PO TABS
5.0000 mg | ORAL_TABLET | Freq: Three times a day (TID) | ORAL | Status: DC | PRN
  Administered 2023-01-03 – 2023-01-16 (×17): 5 mg via ORAL
  Filled 2023-01-03 (×18): qty 1

## 2023-01-03 NOTE — Progress Notes (Signed)
PHARMACY - ANTICOAGULATION CONSULT NOTE  Pharmacy Consult for heparin  Indication: atrial fibrillation  No Known Allergies  Patient Measurements: Height: 5\' 10"  (177.8 cm) Weight: 101.3 kg (223 lb 5.2 oz) IBW/kg (Calculated) : 73 Heparin Dosing Weight: 93 kg  Vital Signs: Temp: 97.9 F (36.6 C) (12/29 1930) Temp Source: Oral (12/29 1930) BP: 93/61 (12/29 1930) Pulse Rate: 107 (12/29 1930)  Labs: Recent Labs    01/01/23 0456 01/01/23 1202 01/02/23 0505 01/03/23 0516 01/03/23 1412 01/03/23 2012  HGB 8.7*  --  8.4* 8.5*  --   --   HCT 27.9*  --  26.9* 26.3*  --   --   PLT 113*  --  104* 102*  --   --   APTT 87* 82* 81*  --   --   --   HEPARINUNFRC >1.10* 1.07* 0.46 0.20* 0.30 0.27*  CREATININE 1.04  --  1.06 1.04  --   --     Estimated Creatinine Clearance: 85.6 mL/min (by C-G formula based on SCr of 1.04 mg/dL).  Assessment: Giscard Noun Abreu is a 62 y.o. year old male admitted on 12/29/2022 with concern for new onset afib with RVR. Currently on diltiazem drip for rate control. No anticoagulation prior to admission. Pharmacy initially consulted to transition from heparin to apixaban, but then relented with drop in Hgb and the possibility of additional intervention. Therefore, patient back on heparin. He had received one dose of Eliquis. CHA2DS2VASc is at least 2 (HTN, HFpEF).  Goal of Therapy:  Heparin level 0.3-0.7 units/ml Monitor platelets by anticoagulation protocol: Yes  1229 0516 0.20 Subtherapeutic 1229 1412 0.30 Therapeutic x 1 1229 2012 0.27 Subtherapeutic  Plan: Give heparin bolus of 1400 units x1 Increase heparin infusion to 2150 units/hr Check heparin level in 6 hours Continue to monitor H&H and platelets  Thank you for involving pharmacy in this patient's care.   Rockwell Alexandria, PharmD Clinical Pharmacist 01/03/2023 9:02 PM

## 2023-01-03 NOTE — Plan of Care (Signed)

## 2023-01-03 NOTE — Progress Notes (Signed)
PROGRESS NOTE   Patient: Justin Fleming                            PCP: Patient, No Pcp Per                    DOB: June 26, 1960            DOA: 12/29/2022 ONG:[id number]             DOS: 01/03/2023, 3:02 PM   LOS: 4 days   Date of Service: The patient was seen and examined on 01/03/2023  Subjective:   --Patient is on his cell phone talking to a friend -Coherent and appropriate -Dyspnea improving -Voiding well--- I's and O's not acute due to difficulty with male condom catheter   Brief Narrative:   History limited due to patient's intoxication/AMS, obtained by patient, chart review, EDP Justin Fleming is a 62 y.o. male with hx of alcohol abuse, hypertension, mood disorder, smoking, GERD, who was brought in after bystander contacted EMS because he was sitting in his car in the parking lot.   Found have alcohol intoxication, A-fib with RVR, suspected aspiration pneumonia, and worsening lactic acidosis with fluids.  Clinically suspect decompensated heart failure.  Denies any known history of A-fib.   EKG:  A-fib with RVR rate 159, LAD, LAFB, inferior Q's, poor R wave progression, T wave inversion laterally, no overt ischemic changes.    ED Course:  Found to be in A-fib with RVR, initially treated with metoprolol 2.5 mg IV x 1 with slight worsening of pressures to systolic in the 90s.  Started on heparin for anticoagulation.  Treated with ceftriaxone/azithromycin, 2 L IV fluid, potassium 40 mg IV and mag 4 g.  At my request consulted with cardiology fellow re: rate control options.  Cards recommending possibly metoprolol IV prn or digoxin, recommend against amiodarone.     Assessment/Plan:  1)HFpEF --acute diastolic dysfunction CHF-echo as noted below #2 -Clinically appears volume overloaded--+ve JVD, significant peripheral edema including edema of the genitalia -Chest x-ray on admission on 12/30/2022   and repeat chest x-ray from 01/03/2023 showed pulmonary venous congestion and  right sided pleural effusion -CTA on 12/31/2022 with bilateral pleural effusions -Daily weights, fluid input and output monitoring as ordered REDs Vest 01/03/23 -Dyspnea improving -Clinically and radiologically patient remains volume overloaded --c/n IV Lasix as BP allows -Voiding well--- I's and O's not acute due to difficulty with male condom catheter  2)A-fib with RVR -new onset -New onset  in the setting of alcohol intoxication -CHA2DS2-VASc is at least 1 (HTN) -Echo from 12/31/2022 with EF of 55 to 60%, bilateral atrial enlargement noted, no significant valvular abnormalities -Cardiology consult appreciated --TSH -normal at 2.7 -Potassium and magnesium WNL -Continue IV heparin for anticoagulation -Rate control remains challenging -Continue IV Cardizem for rate control--as BP allows -Cardiologist recommends titrating of metoprolol and added digoxin--- check dig levels  3)Acute hypoxic respiratory failure--- due to #1 and  #2 above -Clinically and radiologically no significant PNA---- -No leukocytosis, No fevers, PCT Negative -Lactic acidosis improved with hydration -Blood cultures NGTD -Patient was initially treated with Rocephin-- -No Further antibiotics indicated -[name]  4)Acute metabolic encephalopathy-----due to above -CT Head with mild diffuse atrophy, no acute abnormalities.   -Mentation is back to baseline ----Patient is coherent and appropriate  5)Acute myocardial injury/demand ischemia in the setting of A-fib with RVR -Ischemic demand due to A-fib with RVR -  Troponin 39 >> 34, -  EKG is A-fib with RVR, nonspecific changes without overt ischemic changes. -Management of alcohol withdrawal, A-fib RVR, heart failure, borderline hypotension, aspiration pneumonia per above. -Echo from 12/31/2022 with preserved EF, without wall motion abnormalities -Appreciate cardiology recommendations   6)-acute anemia with heme positive stool--- ??  Upper GI bleed  in an alcoholic male--- anemia workup consistent with iron deficiency, B12 and folate deficiency as well -Appears macrocytic and hyperchromic - Folate 3.6, B12 -158----B12 and folic acid replacement initiated -- Iron 13, saturation 7% --- iron supplementation ordered Hgb trended down from 11.5 on admission down to 8.5 at this time -Continue IV PPI -GI consult appreciated -Defer timing of possible EGD and colonoscopy to GI team (colonoscopy was in 2016, no prior EGD)  7)AKI----acute kidney injury---due to dehydration -  creatinine on admission= 1.31 , - baseline creatinine =1.0     -Creatinine has normalized  -renally adjust medications, avoid nephrotoxic agents / dehydration  / hypotension  8)Social/Ethics--- prior to admission lived alone, admits to heavy alcohol abuse -Patient Sister Justin Fleming is primary contact  9)Alcohol intoxication-- -Reports drinking up to 1.75 L of liquor a day --Patient completed Librium taper, Coherent and appropriate  , continue folic acid thiamine and multivitamin   ----------------------------------------------------------------------------------------------------------------------------------------------- Nutritional status:  The patient's BMI is: Body mass index is 32.04 kg/m. I agree with the assessment and plan as outlined below: Nutrition Status:  DVT prophylaxis:  Heparin drip  Code Status:   Code Status: Full Code  Family Communication: Discussed with sister  Admission status:    Inpatient  Disposition: From  - home             Planning for discharge to SNF rehab Vs Home with [facility] Procedures:   Antimicrobials:  Anti-infectives (From admission, onward)    Start     Dose/Rate Route Frequency Ordered Stop   12/31/22 0000  cefTRIAXone (ROCEPHIN) 2 g in sodium chloride 0.9 % 100 mL IVPB        2 g 200 mL/hr over 30 Minutes Intravenous Every 24 hours 12/30/22 0443 01/02/23 0013   12/30/22 2200  azithromycin (ZITHROMAX) tablet 500 mg         500 mg Oral Daily at bedtime 12/30/22 0443 12/31/22 2112   12/30/22 0000  cefTRIAXone (ROCEPHIN) 2 g in sodium chloride 0.9 % 100 mL IVPB        2 g 200 mL/hr over 30 Minutes Intravenous  Once 12/29/22 2345 12/30/22 0100   12/30/22 0000  azithromycin (ZITHROMAX) 500 mg in sodium chloride 0.9 % 250 mL IVPB        500 mg 250 mL/hr over 60 Minutes Intravenous  Once 12/29/22 2345 12/30/22 0158      Medication:   Chlorhexidine Gluconate Cloth  6 each Topical Q0600   cyanocobalamin  1,000 mcg Intramuscular Once   vitamin B-12  1,000 mcg Oral Daily   digoxin  0.25 mg Oral Daily   ferrous sulfate  325 mg Oral Q breakfast   folic acid  1 mg Oral Daily   furosemide  40 mg Intravenous Q12H   metoprolol tartrate  12.5 mg Oral QID   multivitamin with minerals  1 tablet Oral Daily   pantoprazole (PROTONIX) IV  40 mg Intravenous Q12H   polyethylene glycol  17 g Oral Daily   sodium chloride flush  10-40 mL Intracatheter Q12H   sodium chloride flush  3 mL Intravenous Q12H   sucralfate  1 g Oral TID WC &  HS   LORazepam **OR** LORazepam, mouth rinse, sodium chloride flush  Objective:   Vitals:   01/03/23 1230 01/03/23 1237 01/03/23 1238 01/03/23 1239  BP:   103/77   Pulse:   100   Resp: (!) 21 (!) 23 20 (!) 22  Temp:      TempSrc:      SpO2:    91%  Weight:      Height:        Intake/Output Summary (Last 24 hours) at 01/03/2023 1502 Last data filed at 01/03/2023 1300 Gross per 24 hour  Intake 1495.23 ml  Output 2275 ml  Net -779.77 ml   Filed Weights   01/01/23 0500 01/02/23 0500 01/03/23 0648  Weight: 101.5 kg 101.6 kg 101.3 kg    Physical examination:    Physical Exam Gen:- Awake Alert, mild conversational dyspnea HEENT:- [place].AT, No sclera icterus Nose- [place] 2L/min Neck-Supple Neck,  +ve JVD,.  Lungs- Diminished breath sounds with bibasilar rales  CV- S1, S2 normal, RRR Abd-  +ve B.Sounds, Abd Soft, No tenderness,    Extremity/Skin:- +ve  edema, good pedal pulses   Psych-affect is appropriate, oriented x3 Neuro-generalized weakness, no new focal deficits, mild tremors GU-Penile and Scrotal Edema Wounds: Please see nursing documentation  LABs:     Latest Ref Rng & Units 01/03/2023    5:16 AM 01/02/2023    5:05 AM 01/01/2023    4:56 AM  CBC  WBC 4.0 - 10.5 K/uL 8.6  8.4  8.6   Hemoglobin 13.0 - 17.0 g/dL 8.5  8.4  8.7   Hematocrit 39.0 - 52.0 % 26.3  26.9  27.9   Platelets 150 - 400 K/uL 102  104  113       Latest Ref Rng & Units 01/03/2023    5:16 AM 01/02/2023    5:05 AM 01/01/2023    4:56 AM  CMP  Glucose 70 - 99 mg/dL 098  119  147   BUN 8 - 23 mg/dL 15  14  17    Creatinine 0.61 - 1.24 mg/dL 8.29  5.62  1.30   Sodium 135 - 145 mmol/L 139  136  137   Potassium 3.5 - 5.1 mmol/L 4.3  3.3  3.9   Chloride 98 - 111 mmol/L 102  100  100   CO2 22 - 32 mmol/L 31  29  29    Calcium 8.9 - 10.3 mg/dL 8.4  8.3  8.5    Micro Results Recent Results (from the past 240 hours)  Culture, blood (routine x 2)     Status: None (Preliminary result)   Collection Time: 12/30/22 12:51 AM   Specimen: BLOOD  Result Value Ref Range Status   Specimen Description BLOOD BLOOD RIGHT ARM  Final   Special Requests   Final    BOTTLES DRAWN AEROBIC ONLY Blood Culture results may not be optimal due to an inadequate volume of blood received in culture bottles   Culture   Final    NO GROWTH 4 DAYS Performed at [facility], [address] [place], [place] [address]    Report Status PENDING  Incomplete  Culture, blood (routine x 2)     Status: None (Preliminary result)   Collection Time: 12/30/22 12:55 AM   Specimen: BLOOD LEFT FOREARM  Result Value Ref Range Status   Specimen Description BLOOD LEFT FOREARM  Final   Special Requests   Final    BOTTLES DRAWN AEROBIC AND ANAEROBIC Blood Culture results may not be optimal  due to an inadequate volume of blood received in culture bottles   Culture   Final    NO GROWTH 4 DAYS Performed at [facility], [address], [place], [place] [address]    Report Status PENDING  Incomplete  Respiratory (~20 pathogens) panel by PCR     Status: None   Collection Time: 12/30/22  3:59 AM   Specimen: Nasopharyngeal Swab; Respiratory  Result Value Ref Range Status   Adenovirus NOT DETECTED NOT DETECTED Final   Coronavirus 229E NOT DETECTED NOT DETECTED Final    Comment: (NOTE) The Coronavirus on the Respiratory Panel, DOES NOT test for the novel  Coronavirus (2019 nCoV)    Coronavirus HKU1 NOT DETECTED NOT DETECTED Final   Coronavirus NL63 NOT DETECTED NOT DETECTED Final   Coronavirus OC43 NOT DETECTED NOT DETECTED Final   Metapneumovirus NOT DETECTED NOT DETECTED Final   Rhinovirus / Enterovirus NOT DETECTED NOT DETECTED Final   Influenza A NOT DETECTED NOT DETECTED Final   Influenza B NOT DETECTED NOT DETECTED Final   Parainfluenza Virus 1 NOT DETECTED NOT DETECTED Final   Parainfluenza Virus 2 NOT DETECTED NOT DETECTED Final   Parainfluenza Virus 3 NOT DETECTED NOT DETECTED Final   Parainfluenza Virus 4 NOT DETECTED NOT DETECTED Final   Respiratory Syncytial Virus NOT DETECTED NOT DETECTED Final   Bordetella pertussis NOT DETECTED NOT DETECTED Final   Bordetella Parapertussis NOT DETECTED NOT DETECTED Final   Chlamydophila pneumoniae NOT DETECTED NOT DETECTED Final   Mycoplasma pneumoniae NOT DETECTED NOT DETECTED Final    Comment: Performed at [facility], [address], [place], [place] [address]  MRSA Next Gen by PCR, Nasal     Status: None   Collection Time: 12/30/22  9:06 AM   Specimen: Nasal Mucosa; Nasal Swab  Result Value Ref Range Status   MRSA by PCR Next Gen NOT DETECTED NOT DETECTED Final    Comment: (NOTE) The GeneXpert MRSA Assay (FDA approved for NASAL specimens only), is one component of a comprehensive MRSA colonization surveillance program. It is not intended to diagnose MRSA infection nor to guide or monitor treatment for MRSA infections. Test performance is not FDA  approved in patients less than 55 years old. Performed at [facility], [address] [place], [place] [address]   SARS Coronavirus 2 by RT PCR (hospital order, performed in [facility] hospital lab) *cepheid single result test* Anterior Nasal Swab     Status: None   Collection Time: 12/31/22 12:31 PM   Specimen: Anterior Nasal Swab  Result Value Ref Range Status   SARS Coronavirus 2 by RT PCR NEGATIVE NEGATIVE Final    Comment: (NOTE) SARS-CoV-2 target nucleic acids are NOT DETECTED.  The SARS-CoV-2 RNA is generally detectable in upper and lower respiratory specimens during the acute phase of infection. The lowest concentration of SARS-CoV-2 viral copies this assay can detect is 250 copies / mL. A negative result does not preclude SARS-CoV-2 infection and should not be used as the sole basis for treatment or other patient management decisions.  A negative result may occur with improper specimen collection / handling, submission of specimen other than nasopharyngeal swab, presence of viral mutation(s) within the areas targeted by this assay, and inadequate number of viral copies (<250 copies / mL). A negative result must be combined with clinical observations, patient history, and epidemiological information.  Fact Sheet for Patients:   RoadLapTop.co.za  Fact Sheet for Healthcare Providers: http://kim-miller.com/  This test is not yet  approved or  cleared by the [place] and has been authorized for detection and/or diagnosis of SARS-CoV-2 by FDA under an Emergency Use Authorization (EUA).  This EUA will remain in effect (meaning this test can be used) for the duration of the COVID-19 declaration under Section 564(b)(1) of the Act, 21 U.S.C. section 360bbb-3(b)(1), unless the authorization is terminated or revoked sooner.  Performed at [facility], [address] [place], [place] [address]    SIGNED: Shon Hale, MD,  -  [facility],  Pager (please use [email or web address] to page/ text) Please use Epic Secure Chat for non-urgent communication (7AM-7PM)  If 7PM-7AM, please contact night-coverage [email or web address], 01/03/2023, 3:02 PM

## 2023-01-03 NOTE — Progress Notes (Addendum)
Gastroenterology & Hepatology   Interval History: Patient is seen in ICU this afternoon, confusion has improved significantly having regular diet.  He is still apprehensive about upper endoscopy and colonoscopy  Inpatient Medications:  Current Facility-Administered Medications:    Chlorhexidine Gluconate Cloth 2 % PADS 6 each, 6 each, Topical, Q0600, Shahmehdi, Seyed A, MD, 6 each at 01/03/23 1610   cyanocobalamin (VITAMIN B12) injection 1,000 mcg, 1,000 mcg, Intramuscular, Once, Emokpae, Courage, MD   cyanocobalamin (VITAMIN B12) tablet 1,000 mcg, 1,000 mcg, Oral, Daily, Shahmehdi, Seyed A, MD, 1,000 mcg at 01/03/23 9604   digoxin (LANOXIN) tablet 0.25 mg, 0.25 mg, Oral, Daily, Dietrich Pates V, MD, 0.25 mg at 01/03/23 0805   diltiazem (CARDIZEM) 125 mg in dextrose 5% 125 mL (1 mg/mL) infusion, 5-15 mg/hr, Intravenous, Titrated, Jonelle Sidle, MD, Last Rate: 15 mL/hr at 01/03/23 1009, 15 mg/hr at 01/03/23 1009   ferrous sulfate tablet 325 mg, 325 mg, Oral, Q breakfast, Emokpae, Courage, MD, 325 mg at 01/03/23 5409   folic acid (FOLVITE) tablet 1 mg, 1 mg, Oral, Daily, Segars, Christiane Ha, MD, 1 mg at 01/03/23 0805   furosemide (LASIX) injection 40 mg, 40 mg, Intravenous, Q12H, Emokpae, Courage, MD, 40 mg at 01/03/23 0619   heparin ADULT infusion 100 units/mL (25000 units/274mL), 1,950 Units/hr, Intravenous, Continuous, Orson Aloe, [facility], Last Rate: 19.5 mL/hr at 01/03/23 0811, 1,950 Units/hr at 01/03/23 0811   LORazepam (ATIVAN) tablet 1-4 mg, 1-4 mg, Oral, Q1H PRN **OR** LORazepam (ATIVAN) injection 1-4 mg, 1-4 mg, Intravenous, Q1H PRN, Mariea Clonts, Courage, MD   metoprolol tartrate (LOPRESSOR) tablet 12.5 mg, 12.5 mg, Oral, QID, Dietrich Pates V, MD, 12.5 mg at 01/03/23 1238   multivitamin with minerals tablet 1 tablet, 1 tablet, Oral, Daily, Segars, Christiane Ha, MD, 1 tablet at 01/03/23 0804   Oral care mouth rinse, 15 mL, Mouth Rinse, PRN, Emokpae, Courage, MD   pantoprazole (PROTONIX)  injection 40 mg, 40 mg, Intravenous, Q12H, Emokpae, Courage, MD, 40 mg at 01/03/23 0804   polyethylene glycol (MIRALAX / GLYCOLAX) packet 17 g, 17 g, Oral, Daily, Shahmehdi, Seyed A, MD, 17 g at 01/03/23 0804   sodium chloride flush (NS) 0.9 % injection 10-40 mL, 10-40 mL, Intracatheter, Q12H, Shahmehdi, Seyed A, MD, 10 mL at 01/03/23 0806   sodium chloride flush (NS) 0.9 % injection 10-40 mL, 10-40 mL, Intracatheter, PRN, Shahmehdi, Seyed A, MD   sodium chloride flush (NS) 0.9 % injection 3 mL, 3 mL, Intravenous, Q12H, Segars, Jonathan, MD, 3 mL at 01/03/23 0806   sucralfate (CARAFATE) 1 GM/10ML suspension 1 g, 1 g, Oral, TID WC & HS, Emokpae, Courage, MD, 1 g at 01/03/23 1225   thiamine (VITAMIN B1) 500 mg in sodium chloride 0.9 % 50 mL IVPB, 500 mg, Intravenous, Q8H, Segars, Christiane Ha, MD, Last Rate: 110 mL/hr at 01/03/23 1237, 500 mg at 01/03/23 1237   I/O    Intake/Output Summary (Last 24 hours) at 01/03/2023 1436 Last data filed at 01/03/2023 1300 Gross per 24 hour  Intake 1495.23 ml  Output 2275 ml  Net -779.77 ml     Physical Exam: Temp:  [97.7 F (36.5 C)-98.8 F (37.1 C)] 97.7 F (36.5 C) (12/29 1136) Pulse Rate:  [30-111] 100 (12/29 1238) Resp:  [14-31] 22 (12/29 1239) BP: (79-117)/(54-81) 103/77 (12/29 1238) SpO2:  [89 %-100 %] 91 % (12/29 1239) Weight:  [101.3 kg] 101.3 kg (12/29 0648)  Temp (24hrs), Avg:98.4 F (36.9 C), Min:97.7 F (36.5 C), Max:98.8 F (37.1 C)  GENERAL: NAD HEENT: Head  is normocephalic and atraumatic. EOMI are intact. Mouth is well hydrated and without lesions. NECK: Supple. No masses LUNGS: Clear to auscultation. No presence of rhonchi/wheezing/rales. Adequate chest expansion HEART: RRR, normal s1 and s2. ABDOMEN: Soft, nontender, no guarding, no peritoneal signs, and nondistended. BS +. No masses.  Laboratory Data: CBC:     Component Value Date/Time   WBC 8.6 01/03/2023 0516   RBC 2.39 (L) 01/03/2023 0516   HGB 8.5 (L) 01/03/2023 0516    HCT 26.3 (L) 01/03/2023 0516   PLT 102 (L) 01/03/2023 0516   MCV 110.0 (H) 01/03/2023 0516   MCH 35.6 (H) 01/03/2023 0516   MCHC 32.3 01/03/2023 0516   RDW 19.6 (H) 01/03/2023 0516   LYMPHSABS 1.6 12/30/2022 0001   MONOABS 0.3 12/30/2022 0001   EOSABS 0.0 12/30/2022 0001   BASOSABS 0.1 12/30/2022 0001   COAG: No results found for: "INR", "PROTIME"  BMP:     Latest Ref Rng & Units 01/03/2023    5:16 AM 01/02/2023    5:05 AM 01/01/2023    4:56 AM  BMP  Glucose 70 - 99 mg/dL 604  540  981   BUN 8 - 23 mg/dL 15  14  17    Creatinine 0.61 - 1.24 mg/dL 1.91  4.78  2.95   Sodium 135 - 145 mmol/L 139  136  137   Potassium 3.5 - 5.1 mmol/L 4.3  3.3  3.9   Chloride 98 - 111 mmol/L 102  100  100   CO2 22 - 32 mmol/L 31  29  29    Calcium 8.9 - 10.3 mg/dL 8.4  8.3  8.5     HEPATIC:     Latest Ref Rng & Units 12/30/2022   12:01 AM 02/02/2018    9:27 AM 02/26/2017    9:58 PM  Hepatic Function  Total Protein 6.5 - 8.1 g/dL 7.0  7.8  8.0   Albumin 3.5 - 5.0 g/dL 3.1   3.9   AST 15 - 41 U/L 22  10  23    ALT 0 - 44 U/L 10  5  21    Alk Phosphatase 38 - 126 U/L 75   69   Total Bilirubin <1.2 mg/dL 1.1  1.1  0.4     CARDIAC: No results found for: "CKTOTAL", "CKMB", "CKMBINDEX", "TROPONINI"    Imaging: I personally reviewed and interpreted the available labs, imaging and endoscopic files.   CT angio negative Positive fecal occult blood Normal BUN/creatinine ratio Patient presented with hemoglobin of 11.5 has trickled down to 8.7 without any overt bleed   Assessment/Plan:  This is a 62 year old male with history of alcohol use disorder, depression, tobacco abuse, GERD, hypertension presented with altered mental status. Patient is admitted with acute decompensated heart failure with A-fib with RVR. GI is consulted with dropping hemoglobin in setting of clinical need for anticoagulation given A-fib.   #Anemia #Positive FOBT  #Vitamin B12 deficiency   This is a patient with cardiac  comorbidities, after anticoagulation have downtrending hemoglobin without any overt bleed. Although HBG stable at 8.5 for past 2 days    I have discussed with patient clinical indication for bidirectional endoscopy given clinically indicated anticoagulation chronically.  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   Following thiamine supplementation   No absolute GI contraindication to continue clinically indicated anticoagulation while patient is closely monitored unless patient has overt GI bleed such as hematochezia melena or hematemesis  Vista Lawman, MD Gastroenterology and Hepatology [facility] Gastroenterology   This chart has been completed using [facility] Dictation software, and while attempts have been made to ensure accuracy , certain words and phrases may not be transcribed as intended

## 2023-01-03 NOTE — Progress Notes (Signed)
PHARMACY - ANTICOAGULATION CONSULT NOTE  Pharmacy Consult for heparin  Indication: atrial fibrillation  No Known Allergies  Patient Measurements: Height: 5\' 10"  (177.8 cm) Weight: 101.3 kg (223 lb 5.2 oz) IBW/kg (Calculated) : 73 Heparin Dosing Weight: 93 kg  Vital Signs: Temp: 97.7 F (36.5 C) (12/29 1136) Temp Source: Oral (12/29 1136) BP: 103/77 (12/29 1238) Pulse Rate: 100 (12/29 1238)  Labs: Recent Labs    01/01/23 0456 01/01/23 1202 01/02/23 0505 01/03/23 0516 01/03/23 1412  HGB 8.7*  --  8.4* 8.5*  --   HCT 27.9*  --  26.9* 26.3*  --   PLT 113*  --  104* 102*  --   APTT 87* 82* 81*  --   --   HEPARINUNFRC >1.10* 1.07* 0.46 0.20* 0.30  CREATININE 1.04  --  1.06 1.04  --     Estimated Creatinine Clearance: 85.6 mL/min (by C-G formula based on SCr of 1.04 mg/dL).  Assessment: Justin Fleming is a 62 y.o. year old male admitted on 12/29/2022 with concern for new onset afib with RVR. Currently on diltiazem drip for rate control. No anticoagulation prior to admission. Pharmacy initially consulted to transition from heparin to apixaban, but then relented with drop in Hgb and the possibility of additional intervention. Therefore, patient back on heparin. He had received one dose of Eliquis. CHA2DS2VASc is at least 2 (HTN, HFpEF).   Goal of Therapy:  Heparin level 0.3-0.7 units/ml Monitor platelets by anticoagulation protocol: Yes  1229 0516 0.20 Subtherapeutic 1229 1412 0.30 Therapeutic x 1  Plan: Continue heparin infusion at 1950 units/hr Check heparin level in 6 hours Continue to monitor H&H and platelets   Thank you for allowing pharmacy to participate in this patient's care.  Will M. Dareen Piano, PharmD Clinical Pharmacist 01/03/2023 3:38 PM

## 2023-01-03 NOTE — Progress Notes (Addendum)
PHARMACY - ANTICOAGULATION CONSULT NOTE  Pharmacy Consult for heparin  Indication: atrial fibrillation  No Known Allergies  Patient Measurements: Height: 5\' 10"  (177.8 cm) Weight: 101.3 kg (223 lb 5.2 oz) IBW/kg (Calculated) : 73 Heparin Dosing Weight: 93 kg  Vital Signs: Temp: 98.4 F (36.9 C) (12/29 0742) Temp Source: Oral (12/29 0742) BP: 98/76 (12/29 0646) Pulse Rate: 87 (12/29 0600)  Labs: Recent Labs    01/01/23 0456 01/01/23 1202 01/02/23 0505 01/03/23 0516  HGB 8.7*  --  8.4* 8.5*  HCT 27.9*  --  26.9* 26.3*  PLT 113*  --  104* 102*  APTT 87* 82* 81*  --   HEPARINUNFRC >1.10* 1.07* 0.46 0.20*  CREATININE 1.04  --  1.06 1.04    Estimated Creatinine Clearance: 85.6 mL/min (by C-G formula based on SCr of 1.04 mg/dL).  Assessment: Justin Fleming is a 62 y.o. year old male admitted on 12/29/2022 with concern for new onset afib with RVR. Currently on diltiazem drip for rate control. No anticoagulation prior to admission. Pharmacy initially consulted to transition from heparin to apixaban, but then relented with drop in Hgb and the possibility of additional intervention. Therefore, patient back on heparin. He had received one dose of Eliquis. CHA2DS2VASc is at least 2 (HTN, HFpEF).   Goal of Therapy:  Heparin level 0.3-0.7 units/ml aPTT 66-102 seconds Monitor platelets by anticoagulation protocol: Yes   Plan: Bolus 1500 units of heparin, then increase heparin infusion to 1950 units/hr Check heparin level in 6 hours Continue to monitor H&H and platelets   Thank you for allowing pharmacy to participate in this patient's care.  Will M. Dareen Piano, PharmD Clinical Pharmacist 01/03/2023 7:44 AM

## 2023-01-04 ENCOUNTER — Inpatient Hospital Stay (HOSPITAL_COMMUNITY): Payer: Medicare Other

## 2023-01-04 ENCOUNTER — Telehealth (INDEPENDENT_AMBULATORY_CARE_PROVIDER_SITE_OTHER): Payer: Self-pay | Admitting: Gastroenterology

## 2023-01-04 DIAGNOSIS — I10 Essential (primary) hypertension: Secondary | ICD-10-CM

## 2023-01-04 DIAGNOSIS — F10920 Alcohol use, unspecified with intoxication, uncomplicated: Secondary | ICD-10-CM

## 2023-01-04 DIAGNOSIS — Z7901 Long term (current) use of anticoagulants: Secondary | ICD-10-CM

## 2023-01-04 DIAGNOSIS — F1010 Alcohol abuse, uncomplicated: Secondary | ICD-10-CM

## 2023-01-04 DIAGNOSIS — D649 Anemia, unspecified: Secondary | ICD-10-CM

## 2023-01-04 LAB — BASIC METABOLIC PANEL
Anion gap: 7 (ref 5–15)
BUN: 16 mg/dL (ref 8–23)
CO2: 31 mmol/L (ref 22–32)
Calcium: 8.4 mg/dL — ABNORMAL LOW (ref 8.9–10.3)
Chloride: 99 mmol/L (ref 98–111)
Creatinine, Ser: 1.1 mg/dL (ref 0.61–1.24)
GFR, Estimated: >60 mL/min (ref >60)
Glucose, Bld: 125 mg/dL — ABNORMAL HIGH (ref 70–99)
Potassium: 3.9 mmol/L (ref 3.5–5.1)
Sodium: 137 mmol/L (ref 135–145)

## 2023-01-04 LAB — CBC
HCT: 26.1 % — ABNORMAL LOW (ref 39.0–52.0)
Hemoglobin: 8.4 g/dL — ABNORMAL LOW (ref 13.0–17.0)
MCH: 35.4 pg — ABNORMAL HIGH (ref 26.0–34.0)
MCHC: 32.2 g/dL (ref 30.0–36.0)
MCV: 110.1 fL — ABNORMAL HIGH (ref 80.0–100.0)
Platelets: 112 10*3/uL — ABNORMAL LOW (ref 150–400)
RBC: 2.37 MIL/uL — ABNORMAL LOW (ref 4.22–5.81)
RDW: 19.7 % — ABNORMAL HIGH (ref 11.5–15.5)
WBC: 7.6 10*3/uL (ref 4.0–10.5)
nRBC: 0 % (ref 0.0–0.2)

## 2023-01-04 LAB — CULTURE, BLOOD (ROUTINE X 2)
Culture: NO GROWTH
Culture: NO GROWTH

## 2023-01-04 LAB — URINALYSIS, ROUTINE W REFLEX MICROSCOPIC
Bilirubin Urine: NEGATIVE
Glucose, UA: NEGATIVE mg/dL
Hgb urine dipstick: NEGATIVE
Ketones, ur: NEGATIVE mg/dL
Leukocytes,Ua: NEGATIVE
Nitrite: NEGATIVE
Protein, ur: NEGATIVE mg/dL
Specific Gravity, Urine: 1.006 (ref 1.005–1.030)
pH: 7 (ref 5.0–8.0)

## 2023-01-04 LAB — RENAL FUNCTION PANEL
Albumin: 2.4 g/dL — ABNORMAL LOW (ref 3.5–5.0)
Anion gap: 9 (ref 5–15)
BUN: 16 mg/dL (ref 8–23)
CO2: 31 mmol/L (ref 22–32)
Calcium: 8.5 mg/dL — ABNORMAL LOW (ref 8.9–10.3)
Chloride: 98 mmol/L (ref 98–111)
Creatinine, Ser: 1.07 mg/dL (ref 0.61–1.24)
GFR, Estimated: >60 mL/min (ref >60)
Glucose, Bld: 125 mg/dL — ABNORMAL HIGH (ref 70–99)
Phosphorus: 3.3 mg/dL (ref 2.5–4.6)
Potassium: 3.9 mmol/L (ref 3.5–5.1)
Sodium: 138 mmol/L (ref 135–145)

## 2023-01-04 LAB — HEPARIN LEVEL (UNFRACTIONATED)
Heparin Unfractionated: 0.36 [IU]/mL (ref 0.30–0.70)
Heparin Unfractionated: 0.29 [IU]/mL — ABNORMAL LOW (ref 0.30–0.70)
Heparin Unfractionated: 0.35 [IU]/mL (ref 0.30–0.70)

## 2023-01-04 LAB — DIGOXIN LEVEL: Digoxin Level: 0.9 ng/mL (ref 0.8–2.0)

## 2023-01-04 LAB — MAGNESIUM: Magnesium: 1.8 mg/dL (ref 1.7–2.4)

## 2023-01-04 MED ORDER — ACETAMINOPHEN 325 MG PO TABS
650.0000 mg | ORAL_TABLET | Freq: Four times a day (QID) | ORAL | Status: DC | PRN
  Administered 2023-01-04 – 2023-01-12 (×6): 650 mg via ORAL
  Filled 2023-01-04 (×6): qty 2

## 2023-01-04 MED ORDER — METOLAZONE 5 MG PO TABS
5.0000 mg | ORAL_TABLET | Freq: Every morning | ORAL | Status: AC
  Administered 2023-01-05: 5 mg via ORAL
  Filled 2023-01-04: qty 1

## 2023-01-04 MED ORDER — FUROSEMIDE 10 MG/ML IJ SOLN
60.0000 mg | Freq: Two times a day (BID) | INTRAMUSCULAR | Status: DC
  Administered 2023-01-04 – 2023-01-06 (×4): 60 mg via INTRAVENOUS
  Filled 2023-01-04 (×4): qty 6

## 2023-01-04 NOTE — Progress Notes (Signed)
PHARMACY - ANTICOAGULATION CONSULT NOTE  Pharmacy Consult for heparin  Indication: atrial fibrillation  No Known Allergies  Patient Measurements: Height: 5\' 10"  (177.8 cm) Weight: 101.3 kg (223 lb 5.2 oz) IBW/kg (Calculated) : 73 Heparin Dosing Weight: 93 kg  Vital Signs: Temp: 97.6 F (36.4 C) (12/30 0000) Temp Source: Oral (12/30 0000) BP: 86/52 (12/30 0330) Pulse Rate: 76 (12/30 0330)  Labs: Recent Labs    01/01/23 0456 01/01/23 1202 01/02/23 0505 01/03/23 0516 01/03/23 1412 01/03/23 2012 01/04/23 0320  HGB 8.7*  --  8.4* 8.5*  --   --   --   HCT 27.9*  --  26.9* 26.3*  --   --   --   PLT 113*  --  104* 102*  --   --   --   APTT 87* 82* 81*  --   --   --   --   HEPARINUNFRC >1.10* 1.07* 0.46 0.20* 0.30 0.27* 0.36  CREATININE 1.04  --  1.06 1.04  --   --  1.07  1.10    Estimated Creatinine Clearance: 80.9 mL/min (by C-G formula based on SCr of 1.1 mg/dL).  Assessment: Justin Fleming is a 62 y.o. year old male admitted on 12/29/2022 with concern for new onset afib with RVR. Currently on diltiazem drip for rate control. No anticoagulation prior to admission. Pharmacy initially consulted to transition from heparin to apixaban, but then relented with drop in Hgb and the possibility of additional intervention. Therefore, patient back on heparin. He had received one dose of Eliquis. CHA2DS2VASc is at least 2 (HTN, HFpEF).  12/30 AM update:  Heparin level therapeutic  Goal of Therapy:  Heparin level 0.3-0.7 units/ml Monitor platelets by anticoagulation protocol: Yes  Plan: Cont heparin 2150 units/hr 1200 heparin level  Abran Duke, PharmD, BCPS Clinical Pharmacist Phone: [phone]

## 2023-01-04 NOTE — Telephone Encounter (Signed)
See progress note from 01/04/23.

## 2023-01-04 NOTE — Plan of Care (Signed)
  Problem: Education: Goal: Knowledge of General Education information will improve Description: Including pain rating scale, medication(s)/side effects and non-pharmacologic comfort measures 01/04/2023 2015 by Corrie Mckusick, RN Outcome: Progressing 01/04/2023 2015 by Corrie Mckusick, RN Outcome: Progressing   Problem: Health Behavior/Discharge Planning: Goal: Ability to manage health-related needs will improve 01/04/2023 2015 by Corrie Mckusick, RN Outcome: Progressing 01/04/2023 2015 by Corrie Mckusick, RN Outcome: Progressing   Problem: Clinical Measurements: Goal: Ability to maintain clinical measurements within normal limits will improve 01/04/2023 2015 by Corrie Mckusick, RN Outcome: Progressing 01/04/2023 2015 by Corrie Mckusick, RN Outcome: Progressing Goal: Will remain free from infection 01/04/2023 2015 by Corrie Mckusick, RN Outcome: Progressing 01/04/2023 2015 by Corrie Mckusick, RN Outcome: Progressing Goal: Diagnostic test results will improve 01/04/2023 2015 by Corrie Mckusick, RN Outcome: Progressing 01/04/2023 2015 by Corrie Mckusick, RN Outcome: Progressing Goal: Respiratory complications will improve 01/04/2023 2015 by Corrie Mckusick, RN Outcome: Progressing 01/04/2023 2015 by Corrie Mckusick, RN Outcome: Progressing Goal: Cardiovascular complication will be avoided 01/04/2023 2015 by Corrie Mckusick, RN Outcome: Progressing 01/04/2023 2015 by Corrie Mckusick, RN Outcome: Progressing   Problem: Activity: Goal: Risk for activity intolerance will decrease 01/04/2023 2015 by Corrie Mckusick, RN Outcome: Progressing 01/04/2023 2015 by Corrie Mckusick, RN Outcome: Progressing   Problem: Nutrition: Goal: Adequate nutrition will be maintained 01/04/2023 2015 by Corrie Mckusick, RN Outcome: Progressing 01/04/2023 2015 by Corrie Mckusick, RN Outcome: Progressing   Problem: Coping: Goal: Level of anxiety will  decrease 01/04/2023 2015 by Corrie Mckusick, RN Outcome: Progressing 01/04/2023 2015 by Corrie Mckusick, RN Outcome: Progressing   Problem: Elimination: Goal: Will not experience complications related to bowel motility 01/04/2023 2015 by Corrie Mckusick, RN Outcome: Progressing 01/04/2023 2015 by Corrie Mckusick, RN Outcome: Progressing Goal: Will not experience complications related to urinary retention 01/04/2023 2015 by Corrie Mckusick, RN Outcome: Progressing 01/04/2023 2015 by Corrie Mckusick, RN Outcome: Progressing   Problem: Pain Management: Goal: General experience of comfort will improve 01/04/2023 2015 by Corrie Mckusick, RN Outcome: Progressing 01/04/2023 2015 by Corrie Mckusick, RN Outcome: Progressing   Problem: Safety: Goal: Ability to remain free from injury will improve 01/04/2023 2015 by Corrie Mckusick, RN Outcome: Progressing 01/04/2023 2015 by Corrie Mckusick, RN Outcome: Progressing   Problem: Skin Integrity: Goal: Risk for impaired skin integrity will decrease 01/04/2023 2015 by Corrie Mckusick, RN Outcome: Progressing 01/04/2023 2015 by Corrie Mckusick, RN Outcome: Progressing

## 2023-01-04 NOTE — Plan of Care (Signed)

## 2023-01-04 NOTE — Progress Notes (Signed)
Subjective: Patient feeling okay this morning. Denies abdominal pain, nausea or vomiting. Has continued knee and leg pain.   Objective: Vital signs in last 24 hours: Temp:  [97.6 F (36.4 C)-98.2 F (36.8 C)] 98.2 F (36.8 C) (12/30 0800) Pulse Rate:  [76-110] 92 (12/30 0830) Resp:  [14-28] 15 (12/30 0300) BP: (81-112)/(41-77) 100/64 (12/30 0830) SpO2:  [86 %-100 %] 100 % (12/30 0830) Weight:  [101 kg] 101 kg (12/30 0500) Last BM Date : 01/03/23 General:   Alert and oriented, pleasant Head:  Normocephalic and atraumatic. Eyes:  No icterus, sclera clear. Conjuctiva pink.   Heart:  S1, S2 present, no murmurs noted.  Lungs: Clear to auscultation bilaterally, without wheezing, rales, or rhonchi.  Abdomen:  Bowel sounds present, soft, non-tender, non-distended. No HSM or hernias noted. No rebound or guarding. No masses appreciated  Neurologic:  Alert and  oriented x4;  grossly normal neurologically. Skin:  Warm and dry, intact without significant lesions.  Psych:  Alert and cooperative. Normal mood and affect.  Intake/Output from previous day: 12/29 0701 - 12/30 0700 In: 1481.5 [P.O.:480; I.V.:855.5; IV Piggyback:146] Out: 2375 [Urine:2375] Intake/Output this shift: Total I/O In: -  Out: 950 [Urine:950]  Lab Results: Recent Labs    01/02/23 0505 01/03/23 0516 01/04/23 0320  WBC 8.4 8.6 7.6  HGB 8.4* 8.5* 8.4*  HCT 26.9* 26.3* 26.1*  PLT 104* 102* 112*   BMET Recent Labs    01/02/23 0505 01/03/23 0516 01/04/23 0320  NA 136 139 138  137  K 3.3* 4.3 3.9  3.9  CL 100 102 98  99  CO2 29 31 31  31   GLUCOSE 119* 124* 125*  125*  BUN 14 15 16  16   CREATININE 1.06 1.04 1.07  1.10  CALCIUM 8.3* 8.4* 8.5*  8.4*   LFT Recent Labs    01/04/23 0320  ALBUMIN 2.4*    Studies/Results: DG CHEST PORT 1 VIEW Result Date: 01/03/2023 CLINICAL DATA:  [id number] Dyspnea and respiratory abnormalities [id number] EXAM: PORTABLE CHEST 1 VIEW COMPARISON:  12/30/2022. FINDINGS:  Low lung volume. Mild-to-moderate diffuse pulmonary vascular congestion without significant interval change. There are bilateral small layering pleural effusions with probable associated compressive atelectatic changes. No pneumothorax. Stable cardio-mediastinal silhouette. No acute osseous abnormalities. The soft tissues are within normal limits. Right-sided PICC line is seen with its tip overlying the cavoatrial junction region. IMPRESSION: *Findings favor congestive heart failure/pulmonary edema, without significant interval change. Electronically Signed   By: Jules Schick M.D.   On: 01/03/2023 08:54    Assessment: Justin Fleming is a 62 year old male with history of alcohol use disorder, depression, tobacco abuse, GERD, HTN, who presented with AMS. Patient is admitted with acute decompensated heart failure with A-fib with RVR. GI consulted for downtrending hemoglobin in setting of clinical need for ACs given A-fib.   Anemia: FOBT positive, B12 deficient.  Hemoglobin stable at 8.4 this morning and has been around this level for the past few days.  Endoscopic evaluation with EGD and colonoscopy was discussed with the patient who still remains apprehensive about pursing this. I did discuss with patient and family indications for endoscopic evaluation. Timing of endoscopic evaluation to be determined given clinical status and consent as patient has cardiac comorbidities.  Anti intrinsic factor antibody and antiparietal antibody was ordered given B12 deficiency.   Plan: Monitor for overt GI bleeding Trend H&H Follow for results of antiparietal antibody/intrinsic factor antibody labs Consider EGD/colonoscopy pending patient consent   LOS: 5 days  01/04/2023, 10:10 AM   Dennise Bamber L. Jeanmarie Hubert, MSN, APRN, AGNP-C Adult-Gerontology Nurse Practitioner [facility] [place]

## 2023-01-04 NOTE — Progress Notes (Signed)
PHARMACY - ANTICOAGULATION CONSULT NOTE  Pharmacy Consult for heparin  Indication: atrial fibrillation  No Known Allergies  Patient Measurements: Height: 5\' 10"  (177.8 cm) Weight: 101 kg (222 lb 10.6 oz) IBW/kg (Calculated) : 73 Heparin Dosing Weight: 93 kg  Vital Signs: Temp: 97.7 F (36.5 C) (12/30 1253) Temp Source: Oral (12/30 1253) BP: 112/74 (12/30 1308) Pulse Rate: 97 (12/30 1308)  Labs: Recent Labs    01/02/23 0505 01/03/23 0516 01/03/23 1412 01/03/23 2012 01/04/23 0320 01/04/23 1132  HGB 8.4* 8.5*  --   --  8.4*  --   HCT 26.9* 26.3*  --   --  26.1*  --   PLT 104* 102*  --   --  112*  --   APTT 81*  --   --   --   --   --   HEPARINUNFRC 0.46 0.20*   < > 0.27* 0.36 0.29*  CREATININE 1.06 1.04  --   --  1.07  1.10  --    < > = values in this interval not displayed.    Estimated Creatinine Clearance: 80.8 mL/min (by C-G formula based on SCr of 1.1 mg/dL).  Assessment: Justin Fleming is a 61 y.o. year old male admitted on 12/29/2022 with concern for new onset afib with RVR. Currently on diltiazem drip for rate control. No anticoagulation prior to admission. Pharmacy initially consulted to transition from heparin to apixaban, but then relented with drop in Hgb and the possibility of additional intervention. Therefore, patient back on heparin. He had received one dose of Eliquis. CHA2DS2VASc is at least 2 (HTN, HFpEF).  HL 0.29- slightly subtherapeutic Hgb 8.4  Goal of Therapy:  Heparin level 0.3-0.7 units/ml Monitor platelets by anticoagulation protocol: Yes  Plan: Increase heparin infusion to 2250 units/hr Heparin level in 6-8 hours and daily Continue to monitor H&H and platelets.  Judeth Cornfield, PharmD Clinical Pharmacist 01/04/2023 1:31 PM

## 2023-01-04 NOTE — Progress Notes (Addendum)
Patient Name: Justin Fleming Date of Encounter: 01/04/2023 [facility] Cardiologist: None   Interval Summary  .    Patient seen on a.m. rounds.  Denies any chest pain or shortness of breath.  Sitting up in bed eating breakfast without difficulty.  Does complain of continued leg pain where he was medicated for overnight.  Remains in atrial fibrillation with rates of 80-100.  Currently on diltiazem at 15 mg/h as well as heparin infusion.  Vital Signs .    Vitals:   01/04/23 0500 01/04/23 0537 01/04/23 0609 01/04/23 0630  BP:  97/62 101/75 96/76  Pulse:  81 90 90  Resp:      Temp:      TempSrc:      SpO2:  (!) 87% 100% 100%  Weight: 101 kg     Height:        Intake/Output Summary (Last 24 hours) at 01/04/2023 0742 Last data filed at 01/04/2023 0548 Gross per 24 hour  Intake 1481.47 ml  Output 2375 ml  Net -893.53 ml      01/04/2023    5:00 AM 01/03/2023    6:48 AM 01/02/2023    5:00 AM  Last 3 Weights  Weight (lbs) 222 lb 10.6 oz 223 lb 5.2 oz 223 lb 15.8 oz  Weight (kg) 101 kg 101.3 kg 101.6 kg      Telemetry/ECG    Atrial fibrillation rates of 80-100- Personally Reviewed  Physical Exam .   GEN: No acute distress.   Neck: + JVD Cardiac: IR IR, no murmurs, rubs, or gallops.  Respiratory: Clear with diminished bases to auscultation bilaterally.  Congested nonproductive cough.  Respirations are unlabored at rest on 2 L of O2 via nasal cannula GI: Soft, nontender, obese, non-distended  MS: 1+ edema  Assessment & Plan .     New onset atrial fibrillation with RVR -Presented with new onset atrial fibrillation with RVR in the setting of acute EtOH intoxication -Was originally placed on diltiazem 15 mg/h -Rates are slowly improving -He is continued on metoprolol 12.5 mg 4 times daily with limited up titration due to soft blood pressures -He is continued on digoxin 0.25 mg daily with a dig level of 0.9 -If he continues to have un optimized rates and  soft blood pressures may have to change him from diltiazem to amiodarone, continued on diltiazem at this time due to improvement in rates over the weekend -Concern for compliance and safety of oral anticoagulation in the setting of EtOH use on outpatient basis -If he continues to be difficult to rate control may require TEE guided DCCV once acute illness has improved  Mildly elevated high-sensitivity troponin -High-sensitivity troponin trended flat -Most likely demand ischemia in the setting of atrial fibrillation with RVR -Continued on telemetry monitoring -Currently on heparin infusion -Continues to remain chest pain-free -EKG as needed for pain or changes  HFpEF -Continues to appear clinically overloaded on exam with positive JVD, peripheral edema and scrotal edema -Chest x-ray on admission on 12/30/2018 4 repeat chest x-ray in 12/26/2022 showed pulmonary venous congestion and right-sided pleural effusion -CT on 12/31/2022 revealed bilateral pleural effusions -Daily weights, I's and O's, low-sodium diet and Reds vest monitoring -Dyspnea slightly improving -Currently remains on IV furosemide 40 mg IV twice daily - -893.5 milliliters output in the last 24 hours -Fluid status continues to remain +2 L -Echocardiogram revealed an LVEF of 55 to 60%, no RWMA, mild concentric LVH, severely dilated left atrial, moderately dilated right atrium,  trivial MR  Anemia with positive FOBT -Hemoglobin 8.4 -Downtrending from 11.5 on admission -Evaluated by GI -Continued on PPI therapy -Daily CBC -Patient remains apprehensive about having an endoscopy or colonoscopy  Bilateral pleural effusions -Currently maintaining oxygen saturations on 2 L of O2 via nasal cannula -Continue with IV furosemide as as allowed by kidney function and blood pressure -If he has difficulty maintaining oxygen saturations consider IR consultation for thoracentesis  EtOH abuse -Total cessation recommended continued  counseling on decreasing amount For questions or updates, please contact [facility] Please consult [email or web address] for contact info under        Signed, SHERI HAMMOCK, NP   Patient examined chart reviewed. Still grossly volume overloaded with edema scrotum/thighs. Would continue diuresis. He lives by himself, drinks a pint of vodka/day and does not f/u with any medical care. Would not consider TEE/DCC as he will undoubtedly not be compliant with anticoagulation. I suspect some of his abdominal edema/anasarca is from cirrhosis as will Primary service shold consider CT imaging or abdominal US to document ascites   Charlton Haws MD [facility]

## 2023-01-04 NOTE — Progress Notes (Signed)
PHARMACY - ANTICOAGULATION CONSULT NOTE  Pharmacy Consult for heparin  Indication: atrial fibrillation  No Known Allergies  Patient Measurements: Height: 5\' 10"  (177.8 cm) Weight: 101 kg (222 lb 10.6 oz) IBW/kg (Calculated) : 73 Heparin Dosing Weight: 93 kg  Vital Signs: Temp: 99.5 F (37.5 C) (12/30 1955) Temp Source: Oral (12/30 1955) BP: 82/53 (12/30 2000) Pulse Rate: 87 (12/30 2000)  Labs: Recent Labs    01/02/23 0505 01/03/23 0516 01/03/23 1412 01/04/23 0320 01/04/23 1132 01/04/23 2001  HGB 8.4* 8.5*  --  8.4*  --   --   HCT 26.9* 26.3*  --  26.1*  --   --   PLT 104* 102*  --  112*  --   --   APTT 81*  --   --   --   --   --   HEPARINUNFRC 0.46 0.20*   < > 0.36 0.29* 0.35  CREATININE 1.06 1.04  --  1.07  1.10  --   --    < > = values in this interval not displayed.    Estimated Creatinine Clearance: 80.8 mL/min (by C-G formula based on SCr of 1.1 mg/dL).  Assessment: Justin Fleming is a 62 y.o. year old male admitted on 12/29/2022 with concern for new onset afib with RVR. Currently on diltiazem drip for rate control. No anticoagulation prior to admission. Pharmacy initially consulted to transition from heparin to apixaban, but then relented with drop in Hgb and the possibility of additional intervention. Therefore, patient back on heparin. He had received one dose of Eliquis. CHA2DS2VASc is at least 2 (HTN, HFpEF).  12/30 PM update HL 0.35 No signs of bleeding or issues with heparin gtt  Goal of Therapy:  Heparin level 0.3-0.7 units/ml Monitor platelets by anticoagulation protocol: Yes  Plan: continue heparin infusion to 2250 units/hr Heparin level in 6-8 hours and daily Continue to monitor H&H and platelets.    Greta Doom BS, PharmD, BCPS Clinical Pharmacist 01/04/2023 8:37 PM  Contact: [phone] after 3 PM  "Be curious, not judgmental..." -Debbora Dus

## 2023-01-05 ENCOUNTER — Inpatient Hospital Stay (HOSPITAL_COMMUNITY): Payer: Self-pay

## 2023-01-05 LAB — HEPARIN LEVEL (UNFRACTIONATED): Heparin Unfractionated: 0.34 [IU]/mL (ref 0.30–0.70)

## 2023-01-05 LAB — CBC
HCT: 25.8 % — ABNORMAL LOW (ref 39.0–52.0)
Hemoglobin: 8.3 g/dL — ABNORMAL LOW (ref 13.0–17.0)
MCH: 35 pg — ABNORMAL HIGH (ref 26.0–34.0)
MCHC: 32.2 g/dL (ref 30.0–36.0)
MCV: 108.9 fL — ABNORMAL HIGH (ref 80.0–100.0)
Platelets: 138 10*3/uL — ABNORMAL LOW (ref 150–400)
RBC: 2.37 MIL/uL — ABNORMAL LOW (ref 4.22–5.81)
RDW: 19.1 % — ABNORMAL HIGH (ref 11.5–15.5)
WBC: 6.5 10*3/uL (ref 4.0–10.5)
nRBC: 0 % (ref 0.0–0.2)

## 2023-01-05 LAB — INTRINSIC FACTOR ANTIBODIES: Intrinsic Factor: 1 [AU]/ml (ref 0.0–1.1)

## 2023-01-05 LAB — ANTI-PARIETAL ANTIBODY: Parietal Cell Antibody-IgG: 5.2 U (ref 0.0–20.0)

## 2023-01-05 MED ORDER — METOPROLOL TARTRATE 25 MG PO TABS
25.0000 mg | ORAL_TABLET | Freq: Four times a day (QID) | ORAL | Status: DC
Start: 2023-01-05 — End: 2023-01-06
  Administered 2023-01-05 (×3): 25 mg via ORAL
  Filled 2023-01-05 (×3): qty 1

## 2023-01-05 MED ORDER — MIDODRINE HCL 5 MG PO TABS
2.5000 mg | ORAL_TABLET | Freq: Three times a day (TID) | ORAL | Status: DC
  Administered 2023-01-05 (×2): 2.5 mg via ORAL
  Filled 2023-01-05 (×2): qty 1

## 2023-01-05 NOTE — Plan of Care (Signed)

## 2023-01-05 NOTE — Progress Notes (Signed)
 Physical Therapy Treatment Patient Details Name: Justin Fleming MRN: [id number] DOB: May 12, 1960 Today's Date: 01/05/2023   History of Present Illness History limited due to patient's intoxication/AMS, obtained by patient, chart review, EDP  Hammad Finkler Zelman is a 62 y.o. male with hx of alcohol abuse, hypertension, mood disorder, smoking, GERD, who was brought in after bystander contacted EMS because he was sitting in his car in the parking lot.  Per triage found in his car with no heat on holding a bottle of liquor.  Initially was unable to provide any history.  At time of my interview appears more awake/interactive compared to ED evaluation.  He reports ongoing heavy alcohol use possibly as much is 1.75 L/day of liquor.  Denies any recent complaints.  However on review of systems reports bilateral lower extremity swelling, intermittent heart racing.  Denies any recent or active chest pain.  No recent fever/chills, cough/cold, abdominal pain, nausea, vomiting, diarrhea, dysuria, rashes, headache, focal numbness or weakness.  Denies any known history of A-fib    PT Comments  Patient demonstrates slow labored movement for sitting up at bedside requiring frequent rest breaks due to BLE pain, unable to fully stand after repeated attempts using RW due to BLE weakness and required Max stand pivot to transfer to chair.  Patient tolerated sitting up in chair after therapy - RN aware. Patient will benefit from continued skilled physical therapy in hospital and recommended venue below to increase strength, balance, endurance for safe ADLs and gait.       If plan is discharge home, recommend the following: A lot of help with walking and/or transfers;Help with stairs or ramp for entrance;Assistance with cooking/housework;A lot of help with bathing/dressing/bathroom   Can travel by private vehicle     Yes  Equipment Recommendations  Rolling walker (2 wheels);BSC/3in1    Recommendations for Other Services        Precautions / Restrictions Precautions Precautions: Fall Restrictions Weight Bearing Restrictions Per Provider Order: No     Mobility  Bed Mobility Overal bed mobility: Needs Assistance Bed Mobility: Sit to Supine     Supine to sit: Mod assist     General bed mobility comments: incresed time, frequent rest breaks due to pain in legs    Transfers Overall transfer level: Needs assistance Equipment used: Rolling walker (2 wheels), 1 person hand held assist Transfers: Sit to/from Stand, Bed to chair/wheelchair/BSC Sit to Stand: Max assist Stand pivot transfers: Max assist         General transfer comment: Patient unable to maintain standing balance using RW, required Max assist stand pivot to transfer to chair    Ambulation/Gait                   Stairs             Wheelchair Mobility     Tilt Bed    Modified Rankin (Stroke Patients Only)       Balance Overall balance assessment: Needs assistance Sitting-balance support: Feet supported, No upper extremity supported Sitting balance-Leahy Scale: Fair Sitting balance - Comments: fair/good seated at EOB   Standing balance support: Reliant on assistive device for balance, During functional activity, Bilateral upper extremity supported Standing balance-Leahy Scale: Zero Standing balance comment: poor/zero using RW                            Cognition Arousal: Alert Behavior During Therapy: WFL for tasks assessed/performed Overall Cognitive  Status: Within Functional Limits for tasks assessed                                          Exercises      General Comments        Pertinent Vitals/Pain Pain Assessment Pain Assessment: Faces Faces Pain Scale: Hurts even more Pain Location: BLE due to swelling Pain Descriptors / Indicators: Discomfort, Grimacing, Sore Pain Intervention(s): Limited activity within patient's tolerance, Monitored during session,  Repositioned    Home Living                          Prior Function            PT Goals (current goals can now be found in the care plan section) Acute Rehab PT Goals Patient Stated Goal: return home with family to assist PT Goal Formulation: With patient Time For Goal Achievement: 01/15/23 Potential to Achieve Goals: Fair Progress towards PT goals: Progressing toward goals    Frequency    Min 3X/week      PT Plan      Co-evaluation              AM-PAC PT 6 Clicks Mobility   Outcome Measure  Help needed turning from your back to your side while in a flat bed without using bedrails?: A Lot Help needed moving from lying on your back to sitting on the side of a flat bed without using bedrails?: A Lot Help needed moving to and from a bed to a chair (including a wheelchair)?: A Lot Help needed standing up from a chair using your arms (e.g., wheelchair or bedside chair)?: A Lot Help needed to walk in hospital room?: Total Help needed climbing 3-5 steps with a railing? : Total 6 Click Score: 10    End of Session Equipment Utilized During Treatment: Oxygen Activity Tolerance: Patient tolerated treatment well;Patient limited by fatigue Patient left: in chair;with call bell/phone within reach Nurse Communication: Mobility status PT Visit Diagnosis: Unsteadiness on feet (R26.81);Other abnormalities of gait and mobility (R26.89);Muscle weakness (generalized) (M62.81)     Time: 8477-8452 PT Time Calculation (min) (ACUTE ONLY): 25 min  Charges:    $Therapeutic Activity: 23-37 mins PT General Charges $$ ACUTE PT VISIT: 1 Visit                     4:01 PM, 01/05/23 Lynwood Music, MPT Physical Therapist with [facility] 336 [phone] office [phone] mobile phone

## 2023-01-05 NOTE — Progress Notes (Signed)
 PHARMACY - ANTICOAGULATION CONSULT NOTE  Pharmacy Consult for heparin   Indication: atrial fibrillation  No Known Allergies  Patient Measurements: Height: 5' 10 (177.8 cm) Weight: 99.4 kg (219 lb 2.2 oz) IBW/kg (Calculated) : 73 Heparin  Dosing Weight: 93 kg  Vital Signs: Temp: 99.2 F (37.3 C) (12/31 0000) Temp Source: Oral (12/31 0000) BP: 107/73 (12/31 0649) Pulse Rate: 100 (12/31 0649)  Labs: Recent Labs    01/03/23 0516 01/03/23 1412 01/04/23 0320 01/04/23 1132 01/04/23 2001 01/05/23 0432  HGB 8.5*  --  8.4*  --   --  8.3*  HCT 26.3*  --  26.1*  --   --  25.8*  PLT 102*  --  112*  --   --  138*  HEPARINUNFRC 0.20*   < > 0.36 0.29* 0.35 0.34  CREATININE 1.04  --  1.07  1.10  --   --   --    < > = values in this interval not displayed.    Estimated Creatinine Clearance: 80.2 mL/min (by C-G formula based on SCr of 1.1 mg/dL).  Assessment: Justin Fleming is a 62 y.o. year old male admitted on 12/29/2022 with concern for new onset afib with RVR. Currently on diltiazem  drip for rate control. No anticoagulation prior to admission. Pharmacy initially consulted to transition from heparin  to apixaban , but then relented with drop in Hgb and the possibility of additional intervention. Therefore, patient back on heparin . He had received one dose of Eliquis . CHA2DS2VASc is at least 2 (HTN, HFpEF).  HL 0.34- therapeutic Hgb 8.3  Goal of Therapy:  Heparin  level 0.3-0.7 units/ml Monitor platelets by anticoagulation protocol: Yes  Plan: Continue heparin  infusion at 2250 units/hr Heparin  level daily Continue to monitor H&H and platelets.  Elspeth Sour, PharmD Clinical Pharmacist 01/05/2023 7:49 AM

## 2023-01-05 NOTE — Progress Notes (Addendum)
 PROGRESS NOTE   Patient: Justin Fleming                            PCP: Patient, No Pcp Per                    DOB: February 03, 1960            DOA: 12/29/2022 FMW:[id number]             DOS: 01/05/2023, 6:49 PM   LOS: 6 days   Date of Service: The patient was seen and examined on 01/05/2023  Subjective:   ---Eating and drinking well -No chest pains or palpitations -Sleepy on and off ---forgetful with intermittent confusion and disorientation   Brief Narrative:   History limited due to patient's intoxication/AMS, obtained by patient, chart review, EDP Dent Plantz Bohnet is a 62 y.o. male with hx of alcohol abuse, hypertension, mood disorder, smoking, GERD, who was brought in after bystander contacted EMS because he was sitting in his car in the parking lot.   Found have alcohol intoxication, A-fib with RVR, suspected aspiration pneumonia, and worsening lactic acidosis with fluids.  Clinically suspect decompensated heart failure.  Denies any known history of A-fib.   EKG:  A-fib with RVR rate 159, LAD, LAFB, inferior Q's, poor R wave progression, T wave inversion laterally, no overt ischemic changes.    ED Course:  Found to be in A-fib with RVR, initially treated with metoprolol  2.5 mg IV x 1 with slight worsening of pressures to systolic in the 90s.  Started on heparin  for anticoagulation.  Treated with ceftriaxone /azithromycin , 2 L IV fluid, potassium 40 mg IV and mag 4 g.  At my request consulted with cardiology fellow re: rate control options.  Cards recommending possibly metoprolol  IV prn or digoxin , recommend against amiodarone .     Assessment/Plan:  1)HFpEF --acute diastolic dysfunction CHF-echo as noted below #2 -Clinically appears volume overloaded--+ve JVD, significant peripheral edema including edema of the genitalia -Chest x-ray on admission on 12/30/2022   and repeat chest x-ray from 01/03/2023 showed pulmonary venous congestion and right sided pleural effusion -CTA on  12/31/2022 with bilateral pleural effusions -Daily weights, fluid input and output monitoring as ordered REDs Vest 01/05/23 -Dyspnea improving -Clinically and radiologically patient remains volume overloaded -Discussed with cardiologist Dr. Maude Fair -c/n IV Lasix  to 60 mg every 12 hours if BP allows -Patient needs additional diuresis -Voiding well--- I's and O's not acute due to difficulty with male condom catheter  2)A-fib with RVR -new onset -New onset  in the setting of alcohol intoxication -CHA2DS2-VASc is at least 1 (HTN) -Echo from 12/31/2022 with EF of 55 to 60%, bilateral atrial enlargement noted, no significant valvular abnormalities -Cardiology consult appreciated --TSH -normal at 2.7 -Potassium and magnesium  WNL -Continue IV heparin  for anticoagulation -Rate control remains challenging -Continue IV Cardizem  for rate control--as BP allows -Cardiologist recommends titrating of metoprolol  and added digoxin ---  -Dig level 0.9 on 01/04/23  3)Acute hypoxic respiratory failure--- due to #1 and  #2 above -Clinically and radiologically no significant PNA---- -No leukocytosis, No fevers, PCT Negative -Lactic acidosis improved with hydration -Blood cultures NGTD -Patient was initially treated with Rocephin -- -No Further antibiotics indicated -Frank Sepsis ruled out  4)Acute metabolic encephalopathy-----due to above -CT Head with mild diffuse atrophy, no acute abnormalities.   --Intermittently confused and disoriented  5)Acute myocardial injury/demand ischemia in the setting of A-fib with RVR -Ischemic demand due to  A-fib with RVR -Troponin 39 >> 34, -  EKG is A-fib with RVR, nonspecific changes without overt ischemic changes. -Management of alcohol withdrawal, A-fib RVR, heart failure, borderline hypotension, aspiration pneumonia per above. -Echo from 12/31/2022 with preserved EF, without wall motion abnormalities -Appreciate cardiology recommendations   6)-acute  anemia with heme positive stool--- ??  Upper GI bleed in an alcoholic male--- anemia workup consistent with iron  deficiency, B12 and folate deficiency as well -Appears macrocytic and hyperchromic - Folate 3.6, B12 -158----B12 and folic acid  replacement initiated -- Iron  13, saturation 7% --- iron  supplementation ordered Hgb trended down from 11.5 on admission down to 8.3 at this time -Continue IV PPI -GI consult appreciated -Defer timing of possible EGD and colonoscopy to GI team (colonoscopy was in 2016, no prior EGD)  7)AKI----acute kidney injury---due to dehydration -  creatinine on admission= 1.31 , - baseline creatinine =1.0     -Creatinine has normalized  -renally adjust medications, avoid nephrotoxic agents / dehydration  / hypotension  8)Social/Ethics--- prior to admission lived alone, admits to heavy alcohol abuse -Patient Sister [name] is primary contact  9)Alcohol intoxication-- -Reports drinking up to 1.75 L of liquor a day --Patient completed Librium  taper,  , continue folic acid  thiamine  and multivitamin   10)Fatty Liver with mild ascites--- in the setting of chronic alcohol abuse -Right upper quadrant ultrasound from 01/04/2023 noted -Supportive management -Diuretics as above  11)Asymptomatic cholelithiasis----right upper quadrant ultrasound shows.. Contracted gallbladder with stones and sludge. No ductal dilatation.   12) capacity evaluation----Justin Fleming is  a  62 y.o.  who is AAO x 3 without significant depressive symptoms and without delusion/psychosis who is unable to understand (without significant language barrier) his current medical diagnosis, patient also unable to accurately verbalizes understanding of proposed treatment options including option of no treatment, the consequences/risk versus benefit of each treatment option, alternatives as well as the option of no treatment. -- Based on my evaluation Justin Fleming  appears to Not have the Capacity to  make decisions or give informed consent about his medical care.  A surrogate decision-maker is required as Justin Fleming  appears to Not have capacity to make his own decisions and give informed consent regarding his medical care ---Patient remains forgetful and intermittently confused -Patient sisters Ms. [name] and Mary at bedside--- they have offered to make medical decisions for patient  ----------------------------------------------------------------------------------------------------------------------------------------------- Nutritional status:  The patient's BMI is: Body mass index is 31.44 kg/m. I agree with the assessment and plan as outlined below: Nutrition Status:  DVT prophylaxis:  Heparin  drip  Code Status:   Code Status: Full Code  Family Communication: Discussed with sister  Admission status:    Inpatient  Disposition: From  - home             Planning for discharge to SNF rehab Vs Home with [facility] Procedures:   Antimicrobials:  Anti-infectives (From admission, onward)    Start     Dose/Rate Route Frequency Ordered Stop   12/31/22 0000  cefTRIAXone  (ROCEPHIN ) 2 g in sodium chloride  0.9 % 100 mL IVPB        2 g 200 mL/hr over 30 Minutes Intravenous Every 24 hours 12/30/22 0443 01/02/23 0013   12/30/22 2200  azithromycin  (ZITHROMAX ) tablet 500 mg        500 mg Oral Daily at bedtime 12/30/22 0443 12/31/22 2112   12/30/22 0000  cefTRIAXone  (ROCEPHIN ) 2 g in sodium chloride  0.9 % 100 mL IVPB  2 g 200 mL/hr over 30 Minutes Intravenous  Once 12/29/22 2345 12/30/22 0100   12/30/22 0000  azithromycin  (ZITHROMAX ) 500 mg in sodium chloride  0.9 % 250 mL IVPB        500 mg 250 mL/hr over 60 Minutes Intravenous  Once 12/29/22 2345 12/30/22 0158      Medication:   Chlorhexidine  Gluconate Cloth  6 each Topical Q0600   cyanocobalamin   1,000 mcg Intramuscular Once   vitamin B-12  1,000 mcg Oral Daily   digoxin   0.25 mg Oral Daily   ferrous sulfate   325 mg  Oral Q breakfast   folic acid   1 mg Oral Daily   furosemide   60 mg Intravenous Q12H   metoprolol  tartrate  25 mg Oral QID   midodrine   2.5 mg Oral TID WC   multivitamin with minerals  1 tablet Oral Daily   pantoprazole  (PROTONIX ) IV  40 mg Intravenous Q12H   polyethylene glycol  17 g Oral Daily   sodium chloride  flush  10-40 mL Intracatheter Q12H   sodium chloride  flush  3 mL Intravenous Q12H   sucralfate   1 g Oral TID WC & HS   acetaminophen , mouth rinse, oxyCODONE , sodium chloride  flush  Objective:   Vitals:   01/05/23 1330 01/05/23 1400 01/05/23 1546 01/05/23 1747  BP: 97/71 (!) 89/63  107/78  Pulse:    (!) 109  Resp: 14 18    Temp:   98.2 F (36.8 C)   TempSrc:   Oral   SpO2:  92%    Weight:      Height:        Intake/Output Summary (Last 24 hours) at 01/05/2023 1849 Last data filed at 01/05/2023 1635 Gross per 24 hour  Intake 1779.93 ml  Output 4850 ml  Net -3070.07 ml   Filed Weights   01/03/23 0648 01/04/23 0500 01/05/23 0500  Weight: 101.3 kg 101 kg 99.4 kg    Physical examination:    Physical Exam Gen:- Awake Alert, mild conversational dyspnea HEENT:- [place].AT, No sclera icterus Neck-Supple Neck,  +ve JVD,.  Lungs-improving air movement,  +ve bibasilar rales  CV- S1, S2 normal, RRR Abd-  +ve B.Sounds, Abd Soft, No tenderness,    Extremity/Skin:- +ve  edema, good pedal pulses  Psych-affect is appropriate, oriented x3, forgetful with intermittent confusion and disorientation Neuro-generalized weakness, no new focal deficits, mild tremors GU-Penile and Scrotal Edema MSK--Rt arm PICC Line in situ Wounds: Please see nursing documentation  LABs:     Latest Ref Rng & Units 01/05/2023    4:32 AM 01/04/2023    3:20 AM 01/03/2023    5:16 AM  CBC  WBC 4.0 - 10.5 K/uL 6.5  7.6  8.6   Hemoglobin 13.0 - 17.0 g/dL 8.3  8.4  8.5   Hematocrit 39.0 - 52.0 % 25.8  26.1  26.3   Platelets 150 - 400 K/uL 138  112  102       Latest Ref Rng & Units 01/04/2023     3:20 AM 01/03/2023    5:16 AM 01/02/2023    5:05 AM  CMP  Glucose 70 - 99 mg/dL 70 - 99 mg/dL 874    874  875  880   BUN 8 - 23 mg/dL 8 - 23 mg/dL 16    16  15  14    Creatinine 0.61 - 1.24 mg/dL 9.38 - 8.75 mg/dL 8.89    8.92  8.95  8.93   Sodium 135 - 145 mmol/L 135 -  145 mmol/L 137    138  139  136   Potassium 3.5 - 5.1 mmol/L 3.5 - 5.1 mmol/L 3.9    3.9  4.3  3.3   Chloride 98 - 111 mmol/L 98 - 111 mmol/L 99    98  102  100   CO2 22 - 32 mmol/L 22 - 32 mmol/L 31    31  31  29    Calcium 8.9 - 10.3 mg/dL 8.9 - 89.6 mg/dL 8.4    8.5  8.4  8.3    Micro Results Recent Results (from the past 240 hours)  Culture, blood (routine x 2)     Status: None   Collection Time: 12/30/22 12:51 AM   Specimen: BLOOD  Result Value Ref Range Status   Specimen Description BLOOD BLOOD RIGHT ARM  Final   Special Requests   Final    BOTTLES DRAWN AEROBIC ONLY Blood Culture results may not be optimal due to an inadequate volume of blood received in culture bottles   Culture   Final    NO GROWTH 5 DAYS Performed at [facility], [address] [place], [place] [address]    Report Status 01/04/2023 FINAL  Final  Culture, blood (routine x 2)     Status: None   Collection Time: 12/30/22 12:55 AM   Specimen: BLOOD LEFT FOREARM  Result Value Ref Range Status   Specimen Description BLOOD LEFT FOREARM  Final   Special Requests   Final    BOTTLES DRAWN AEROBIC AND ANAEROBIC Blood Culture results may not be optimal due to an inadequate volume of blood received in culture bottles   Culture   Final    NO GROWTH 5 DAYS Performed at [facility], [address], [place], [place] [address]    Report Status 01/04/2023 FINAL  Final  Respiratory (~20 pathogens) panel by PCR     Status: None   Collection Time: 12/30/22  3:59 AM   Specimen: Nasopharyngeal Swab; Respiratory  Result Value Ref Range Status   Adenovirus NOT DETECTED NOT DETECTED Final   Coronavirus 229E NOT DETECTED NOT DETECTED Final     Comment: (NOTE) The Coronavirus on the Respiratory Panel, DOES NOT test for the novel  Coronavirus (2019 nCoV)    Coronavirus HKU1 NOT DETECTED NOT DETECTED Final   Coronavirus NL63 NOT DETECTED NOT DETECTED Final   Coronavirus OC43 NOT DETECTED NOT DETECTED Final   Metapneumovirus NOT DETECTED NOT DETECTED Final   Rhinovirus / Enterovirus NOT DETECTED NOT DETECTED Final   Influenza A NOT DETECTED NOT DETECTED Final   Influenza B NOT DETECTED NOT DETECTED Final   Parainfluenza Virus 1 NOT DETECTED NOT DETECTED Final   Parainfluenza Virus 2 NOT DETECTED NOT DETECTED Final   Parainfluenza Virus 3 NOT DETECTED NOT DETECTED Final   Parainfluenza Virus 4 NOT DETECTED NOT DETECTED Final   Respiratory Syncytial Virus NOT DETECTED NOT DETECTED Final   Bordetella pertussis NOT DETECTED NOT DETECTED Final   Bordetella Parapertussis NOT DETECTED NOT DETECTED Final   Chlamydophila pneumoniae NOT DETECTED NOT DETECTED Final   Mycoplasma pneumoniae NOT DETECTED NOT DETECTED Final    Comment: Performed at [facility], [address] [place], [place] [address]  MRSA Next Gen by PCR, Nasal     Status: None   Collection Time: 12/30/22  9:06 AM   Specimen: Nasal Mucosa; Nasal Swab  Result Value Ref Range Status   MRSA by PCR Next Gen NOT DETECTED NOT DETECTED Final  Comment: (NOTE) The GeneXpert MRSA Assay (FDA approved for NASAL specimens only), is one component of a comprehensive MRSA colonization surveillance program. It is not intended to diagnose MRSA infection nor to guide or monitor treatment for MRSA infections. Test performance is not FDA approved in patients less than 84 years old. Performed at [facility], [address] [place], [place] [address]   SARS Coronavirus 2 by RT PCR (hospital order, performed in [facility] hospital lab) *cepheid single result test* Anterior Nasal Swab     Status: None   Collection Time: 12/31/22 12:31 PM   Specimen: Anterior Nasal Swab  Result  Value Ref Range Status   SARS Coronavirus 2 by RT PCR NEGATIVE NEGATIVE Final    Comment: (NOTE) SARS-CoV-2 target nucleic acids are NOT DETECTED.  The SARS-CoV-2 RNA is generally detectable in upper and lower respiratory specimens during the acute phase of infection. The lowest concentration of SARS-CoV-2 viral copies this assay can detect is 250 copies / mL. A negative result does not preclude SARS-CoV-2 infection and should not be used as the sole basis for treatment or other patient management decisions.  A negative result may occur with improper specimen collection / handling, submission of specimen other than nasopharyngeal swab, presence of viral mutation(s) within the areas targeted by this assay, and inadequate number of viral copies (<250 copies / mL). A negative result must be combined with clinical observations, patient history, and epidemiological information.  Fact Sheet for Patients:   roadlaptop.co.za  Fact Sheet for Healthcare Providers: http://kim-miller.com/  This test is not yet approved or  cleared by the [place]  FDA and has been authorized for detection and/or diagnosis of SARS-CoV-2 by FDA under an Emergency Use Authorization (EUA).  This EUA will remain in effect (meaning this test can be used) for the duration of the COVID-19 declaration under Section 564(b)(1) of the Act, 21 U.S.C. section 360bbb-3(b)(1), unless the authorization is terminated or revoked sooner.  Performed at [facility], [address] [place], [place] [address]   Culture, blood (Routine X 2) w Reflex to ID Panel     Status: None (Preliminary result)   Collection Time: 01/04/23  4:19 PM   Specimen: BLOOD LEFT HAND  Result Value Ref Range Status   Specimen Description BLOOD LEFT HAND  Final   Special Requests   Final    BOTTLES DRAWN AEROBIC AND ANAEROBIC Blood Culture adequate volume   Culture   Final    NO GROWTH < 24  HOURS Performed at [facility], [address] [place], [place] [address]    Report Status PENDING  Incomplete  Culture, blood (Routine X 2) w Reflex to ID Panel     Status: None (Preliminary result)   Collection Time: 01/04/23  4:19 PM   Specimen: BLOOD LEFT WRIST  Result Value Ref Range Status   Specimen Description BLOOD LEFT WRIST  Final   Special Requests   Final    BOTTLES DRAWN AEROBIC AND ANAEROBIC Blood Culture adequate volume   Culture   Final    NO GROWTH < 24 HOURS Performed at [facility], [address], [place], [place] [address]    Report Status PENDING  Incomplete   SIGNED: Rendall Carwin, MD,  - [facility],  Pager (please use [email or web address] to page/ text) Please use Epic Secure Chat for non-urgent communication (7AM-7PM)  If 7PM-7AM, please contact night-coverage [email or web address], 01/05/2023, 6:49 PM

## 2023-01-05 NOTE — Progress Notes (Signed)
  Justin Fleming is  a  62 y.o.  who is AAO x 3 without significant depressive symptoms and without delusion/psychosis who is unable to understand (without significant language barrier) his current medical diagnosis, patient also unable to accurately verbalizes understanding of proposed treatment options including option of no treatment, the consequences/risk versus benefit of each treatment option, alternatives as well as the option of no treatment. -- Based on my evaluation Justin Fleming  appears to Not have the Capacity to make decisions or give informed consent about his medical care.  A surrogate decision-maker is required as Justin Fleming  appears to Not have capacity to make his own decisions and give informed consent regarding his medical care -- -Patient remains forgetful and intermittently confused  Patient sisters Ms. Justin Fleming and Justin Fleming at bedside--- they have offered to make medical decisions for patient  Rendall Carwin, MD

## 2023-01-05 NOTE — Progress Notes (Addendum)
 Patient Name: Justin Fleming Date of Encounter: 01/05/2023 [facility] Cardiologist: None   Interval Summary  .    Patient seen on AM rounds. Denies any chest pain or shortness of breath. Continues to remain volume up. -1.4L output in the last 24 hours. Remains on diltiazem  and heparin  infusions. Rates have been better controlled.  Vital Signs .    Vitals:   01/05/23 0500 01/05/23 0602 01/05/23 0630 01/05/23 0649  BP: 94/64 99/76 101/73 107/73  Pulse: 86 92 100 100  Resp: 16 13 13 15   Temp:      TempSrc:      SpO2: 99% 100% 100% 96%  Weight: 99.4 kg     Height:        Intake/Output Summary (Last 24 hours) at 01/05/2023 0744 Last data filed at 01/05/2023 0731 Gross per 24 hour  Intake 2580.1 ml  Output 4000 ml  Net -1419.9 ml      01/05/2023    5:00 AM 01/04/2023    5:00 AM 01/03/2023    6:48 AM  Last 3 Weights  Weight (lbs) 219 lb 2.2 oz 222 lb 10.6 oz 223 lb 5.2 oz  Weight (kg) 99.4 kg 101 kg 101.3 kg      Telemetry/ECG    Remains in atrial fibrillation rate controlled at 70-90- Personally Reviewed  Physical Exam .   GEN: No acute distress.   Neck: + JVD Cardiac: IR IR, no murmurs, rubs, or gallops.  Respiratory: Clear with diminished bases to auscultation bilaterally.  Respirations remain unlabored at rest on 2 of O2 via nasal cannula GI: Soft, nontender, non-distended  MS: 1+ edema to BLE and scrotum  Assessment & Plan .     New onset atrial fibrillation with RVR -Presented with new onset atrial fibrillation with RVR in the setting of acute EtOH intoxication -Has been continued on diltiazem  drip at 15 mg/h -Continued on metoprolol  and digoxin  -Rates have drastically improved since admission -Continued on heparin  infusion that would need to be transition to OAC prior to discharge -Continued concern for compliance and safety of oral anticoagulation serum EtOH use on outpatient basis, patient tentatively scheduled to go to SNF on  discharge -If he continues to have unable to aspirate some soft blood pressures after changing from diltiazem  to amiodarone  -Patient with concerns for GI bleeding with GI evaluation completing as they are recommending patient be completely optimized and cardioverted prior to endoscopic procedures being completed  Mildly elevated high-sensitivity troponin -High-sensitivity troponin trended flat -Most likely demand ischemia in the setting atrial fibrillation with RVR -Continued on telemetry monitoring -Remains chest pain-free -Currently on heparin  infusion -EKG as needed for pain or changes  HFpEF -Continues to appear clinically volume up --1.4 L output in the last 24 hours -Dyspnea has improved -Echocardiogram revealed LVEF of 55 to 60%, no RWMA, mild concentric LVH, severely dilated left atrium, moderately dilated right atrium, trivial MR -Continued on IV furosemide  60 mg IV twice daily as allowed by kidney function and blood pressure -Daily weights, I's and O's, low-sodium diet) monitoring  Anemia with positive FOBT -Hemoglobin 8.3 -Downtrending from 11.5 on admission -Continue on PPI therapy -Daily CBC -Evaluated by GI -Will continue to do workup with endoscopic and colonoscopy  Bilateral pleural effusions -Currently maintaining oxygen saturation on 2 L of O2 via nasal cannula -Continued on IV furosemide  -If he has difficulty maintaining oxygen saturations consider IR consultation for thoracentesis  EtOH abuse -Total cessation is recommended  For questions or updates, please  contact [facility] Please consult [email or web address] for contact info under        Signed, SHERI HAMMOCK, NP    Attending Note Patient seen and discussed with NP Hammock, I agree with her documentation.   1.New onset afib with RVR - new diagnosis this admission, presented with RVR in setting of EtOH intoxication - initially on dilt gtt, rates somewhat difficult to manage due to soft  bp's - Concern for compliance and safety of oral anticoagulation in the setting of EtOH use on outpatient basis. Would be hesitant to consider TEE/DCCV without confidence in anticoag compliance. Severe LAE LAVI 55, chronic EtOH abuse likely would be difficult to maintain SR. Some risk to consider amio chemical conversion as well for same reason.   -currently on digoxin  0.25mg  daily, dilt gtt 15, lopressor  12.5mg  qid. On heparin  gtt in short term. Hgb has leveled in low 8s - hesitant to consider rhythm control strategy as listed above. Soft bp's at times limit rate control. Add midodrine  2.5mg  tid. Increase lopressor  to 25mg  qid, work to wean dilt gtt.     2.Acute HFpEF - 12/2022 echo LVEF 55-60%, no WMAs, indet diastolic, RV not well visualized, severe LAE LAVI 55 - BNP up to 549, CXR with CHF. - had significant leg and scrotal edema - he is on IV lasix  60mg  bid, negative 519 yesterday, total I/Os incomplete this admissoin. Labs pending  -remains fluid overloaded, continue IV lasix .    3. Anemia with positive FOBT - per GI and primary team  4. EtoH abuse   Dorn Ross MD

## 2023-01-05 NOTE — Progress Notes (Signed)
 Subjective: No concerns for me today. Denies abdominal pain, nausea, vomiting. Reports left knee pain.   Objective: Vital signs in last 24 hours: Temp:  [97.8 F (36.6 C)-100.3 F (37.9 C)] 99.3 F (37.4 C) (12/31 1144) Pulse Rate:  [68-110] 88 (12/31 1241) Resp:  [12-21] 18 (12/31 1400) BP: (80-147)/(50-93) 89/63 (12/31 1400) SpO2:  [91 %-100 %] 92 % (12/31 1400) Weight:  [99.4 kg] 99.4 kg (12/31 0500) Last BM Date : 01/05/23 General:   Alert and oriented, pleasant, NAD.  Head:  Normocephalic and atraumatic. Eyes:  No icterus, sclera clear. Conjuctiva pink.  Abdomen:  Abdomen is full but soft. No TTP. Bowel sounds present.  Msk:  Symmetrical without gross deformities. Normal posture. Extremities:  With bilateral LE edema.  Neurologic:  Alert and  oriented;  grossly normal neurologically. Skin:  Warm and dry, intact without significant lesions.  Psych:  Normal mood and affect.  Intake/Output from previous day: 12/30 0701 - 12/31 0700 In: 2580.1 [P.O.:1440; I.V.:934.5; IV Piggyback:205.6] Out: 3100 [Urine:3100] Intake/Output this shift: Total I/O In: -  Out: 2700 [Urine:2700]  Lab Results: Recent Labs    01/03/23 0516 01/04/23 0320 01/05/23 0432  WBC 8.6 7.6 6.5  HGB 8.5* 8.4* 8.3*  HCT 26.3* 26.1* 25.8*  PLT 102* 112* 138*   BMET Recent Labs    01/03/23 0516 01/04/23 0320  NA 139 138  137  K 4.3 3.9  3.9  CL 102 98  99  CO2 31 31  31   GLUCOSE 124* 125*  125*  BUN 15 16  16   CREATININE 1.04 1.07  1.10  CALCIUM 8.4* 8.5*  8.4*   LFT Recent Labs    01/04/23 0320  ALBUMIN  2.4*     Studies/Results: DG CHEST PORT 1 VIEW Result Date: 01/05/2023 CLINICAL DATA:  Dyspnea. EXAM: PORTABLE CHEST 1 VIEW COMPARISON:  January 03, 2023. FINDINGS: Stable cardiomegaly. Mild central pulmonary vascular congestion is noted. Hypoinflation of the lungs is noted with bibasilar atelectasis or edema with associated effusions. Bony thorax is unremarkable.  Right-sided PICC line is unchanged. IMPRESSION: Stable cardiomegaly with mild central pulmonary vascular congestion. Hypoinflation of the lungs is noted with bibasilar atelectasis or edema with associated effusions. Electronically Signed   By: Lynwood Landy Raddle M.D.   On: 01/05/2023 10:50   US  Abdomen Limited RUQ (LIVER/GB) Result Date: 01/04/2023 CLINICAL DATA:  Liver disease. EXAM: ULTRASOUND ABDOMEN LIMITED RIGHT UPPER QUADRANT COMPARISON:  Ultrasound 2016 FINDINGS: Gallbladder: Gallbladder is underdistended. Sludge and stones suggested. The wall thickening of the gallbladder could relate to the patient's history of chronic liver disease in the contracted state. Evaluation for Murphy's sign is limited as the patient could not responding to commands. Common bile duct: Diameter: 4 mm Liver: Echogenic hepatic parenchyma consistent with fatty liver infiltration. Please correlate for known history of chronic liver disease. If there is concern further of underlying mass lesion additional workup with a contrast study is recommended. Portal vein is patent on color Doppler imaging with normal direction of blood flow towards the liver. Other: Mild ascites. IMPRESSION: Echogenic liver.  Mild ascites. Contracted gallbladder with stones and sludge. No ductal dilatation. Electronically Signed   By: Ranell Bring M.D.   On: 01/04/2023 15:38    Assessment: 62 year old male with history of alcohol use disorder, depression, tobacco abuse, GERD, HTN, who presented with AMS. Patient is admitted with acute decompensated heart failure with A-fib with RVR. GI consulted for downtrending hemoglobin in setting of clinical need for ACs given A-fib.  Anemia:  Hemoglobin declined to the 8 range from 11, that has remained fairly stable over the last 4 days.  No overt GI bleeding though FOBT was positive.  He also has low iron  and iron  saturation with elevated ferritin, likely reactive in the setting of chronic alcohol use.  Also with  B12 and folate deficiency.  Endoscopic evaluation with EGD and colonoscopy has been recommended.  I discussed this again with him today, but he is apprehensive stating that he would like to discuss this further with his family.  I tried reaching his sister, [name], but was unsuccessful.  If he remains stable from a cardiac standpoint, could consider EGD and colonoscopy on Thursday if he is agreeable.  Notably, his last colonoscopy was in 2016 with 1 hyperplastic polyp.  No prior EGD.   Plan: Continue to monitor H&H and for overt GI bleeding. Continue with B12, folate, and iron  supplementation.  Continue IV PPI BID.  Could consider EGD and colonoscopy as early as Thursday if patient remains stable from a cardiac standpoint and is agreeable to procedures. Will place on clear liquid diet for tomorrow in the instance that he is agreeable to procedures for Thursday as he would need to complete a colon prep tomorrow.  If patient declines, would be fine to resume current soft diet.    LOS: 6 days    01/05/2023, 3:09 PM   [facility], [facility]

## 2023-01-06 ENCOUNTER — Encounter (HOSPITAL_COMMUNITY): Payer: Self-pay | Admitting: Internal Medicine

## 2023-01-06 DIAGNOSIS — D509 Iron deficiency anemia, unspecified: Secondary | ICD-10-CM | POA: Diagnosis present

## 2023-01-06 DIAGNOSIS — J9601 Acute respiratory failure with hypoxia: Secondary | ICD-10-CM | POA: Diagnosis present

## 2023-01-06 DIAGNOSIS — E872 Acidosis, unspecified: Secondary | ICD-10-CM | POA: Diagnosis present

## 2023-01-06 DIAGNOSIS — Z515 Encounter for palliative care: Secondary | ICD-10-CM | POA: Diagnosis not present

## 2023-01-06 DIAGNOSIS — J351 Hypertrophy of tonsils: Secondary | ICD-10-CM | POA: Diagnosis present

## 2023-01-06 DIAGNOSIS — F32A Depression, unspecified: Secondary | ICD-10-CM | POA: Diagnosis present

## 2023-01-06 DIAGNOSIS — Z7901 Long term (current) use of anticoagulants: Secondary | ICD-10-CM

## 2023-01-06 DIAGNOSIS — F17200 Nicotine dependence, unspecified, uncomplicated: Secondary | ICD-10-CM | POA: Diagnosis not present

## 2023-01-06 DIAGNOSIS — K8020 Calculus of gallbladder without cholecystitis without obstruction: Secondary | ICD-10-CM | POA: Diagnosis present

## 2023-01-06 DIAGNOSIS — I5A Non-ischemic myocardial injury (non-traumatic): Secondary | ICD-10-CM | POA: Diagnosis present

## 2023-01-06 DIAGNOSIS — E66811 Obesity, class 1: Secondary | ICD-10-CM | POA: Diagnosis present

## 2023-01-06 DIAGNOSIS — D649 Anemia, unspecified: Secondary | ICD-10-CM | POA: Diagnosis not present

## 2023-01-06 DIAGNOSIS — Z1152 Encounter for screening for COVID-19: Secondary | ICD-10-CM | POA: Diagnosis not present

## 2023-01-06 DIAGNOSIS — A419 Sepsis, unspecified organism: Secondary | ICD-10-CM | POA: Diagnosis not present

## 2023-01-06 DIAGNOSIS — I4819 Other persistent atrial fibrillation: Secondary | ICD-10-CM | POA: Diagnosis not present

## 2023-01-06 DIAGNOSIS — F10920 Alcohol use, unspecified with intoxication, uncomplicated: Secondary | ICD-10-CM | POA: Diagnosis not present

## 2023-01-06 DIAGNOSIS — E876 Hypokalemia: Secondary | ICD-10-CM | POA: Diagnosis present

## 2023-01-06 DIAGNOSIS — K760 Fatty (change of) liver, not elsewhere classified: Secondary | ICD-10-CM | POA: Diagnosis present

## 2023-01-06 DIAGNOSIS — R188 Other ascites: Secondary | ICD-10-CM | POA: Diagnosis present

## 2023-01-06 DIAGNOSIS — R7989 Other specified abnormal findings of blood chemistry: Secondary | ICD-10-CM | POA: Diagnosis not present

## 2023-01-06 DIAGNOSIS — Y908 Blood alcohol level of 240 mg/100 ml or more: Secondary | ICD-10-CM | POA: Diagnosis present

## 2023-01-06 DIAGNOSIS — F1010 Alcohol abuse, uncomplicated: Secondary | ICD-10-CM | POA: Diagnosis not present

## 2023-01-06 DIAGNOSIS — I340 Nonrheumatic mitral (valve) insufficiency: Secondary | ICD-10-CM | POA: Diagnosis present

## 2023-01-06 DIAGNOSIS — I110 Hypertensive heart disease with heart failure: Secondary | ICD-10-CM | POA: Diagnosis present

## 2023-01-06 DIAGNOSIS — Z7189 Other specified counseling: Secondary | ICD-10-CM | POA: Diagnosis not present

## 2023-01-06 DIAGNOSIS — F10120 Alcohol abuse with intoxication, uncomplicated: Secondary | ICD-10-CM | POA: Diagnosis present

## 2023-01-06 DIAGNOSIS — I4891 Unspecified atrial fibrillation: Secondary | ICD-10-CM | POA: Diagnosis not present

## 2023-01-06 DIAGNOSIS — M1000 Idiopathic gout, unspecified site: Secondary | ICD-10-CM | POA: Diagnosis not present

## 2023-01-06 DIAGNOSIS — Y636 Underdosing and nonadministration of necessary drug, medicament or biological substance: Secondary | ICD-10-CM | POA: Diagnosis present

## 2023-01-06 DIAGNOSIS — Z66 Do not resuscitate: Secondary | ICD-10-CM | POA: Diagnosis not present

## 2023-01-06 DIAGNOSIS — K921 Melena: Secondary | ICD-10-CM | POA: Diagnosis not present

## 2023-01-06 DIAGNOSIS — G9341 Metabolic encephalopathy: Secondary | ICD-10-CM | POA: Diagnosis present

## 2023-01-06 DIAGNOSIS — D62 Acute posthemorrhagic anemia: Secondary | ICD-10-CM | POA: Diagnosis not present

## 2023-01-06 DIAGNOSIS — F10139 Alcohol abuse with withdrawal, unspecified: Secondary | ICD-10-CM | POA: Diagnosis not present

## 2023-01-06 DIAGNOSIS — I5031 Acute diastolic (congestive) heart failure: Secondary | ICD-10-CM | POA: Diagnosis not present

## 2023-01-06 DIAGNOSIS — I5033 Acute on chronic diastolic (congestive) heart failure: Secondary | ICD-10-CM | POA: Diagnosis present

## 2023-01-06 DIAGNOSIS — N179 Acute kidney failure, unspecified: Secondary | ICD-10-CM | POA: Diagnosis present

## 2023-01-06 DIAGNOSIS — K2970 Gastritis, unspecified, without bleeding: Secondary | ICD-10-CM | POA: Diagnosis present

## 2023-01-06 DIAGNOSIS — J690 Pneumonitis due to inhalation of food and vomit: Secondary | ICD-10-CM | POA: Diagnosis present

## 2023-01-06 DIAGNOSIS — I5030 Unspecified diastolic (congestive) heart failure: Secondary | ICD-10-CM | POA: Diagnosis not present

## 2023-01-06 DIAGNOSIS — E860 Dehydration: Secondary | ICD-10-CM | POA: Diagnosis present

## 2023-01-06 DIAGNOSIS — I2489 Other forms of acute ischemic heart disease: Secondary | ICD-10-CM | POA: Diagnosis present

## 2023-01-06 LAB — RENAL FUNCTION PANEL
Albumin: 2.4 g/dL — ABNORMAL LOW (ref 3.5–5.0)
Anion gap: 11 (ref 5–15)
BUN: 12 mg/dL (ref 8–23)
CO2: 35 mmol/L — ABNORMAL HIGH (ref 22–32)
Calcium: 8.6 mg/dL — ABNORMAL LOW (ref 8.9–10.3)
Chloride: 85 mmol/L — ABNORMAL LOW (ref 98–111)
Creatinine, Ser: 1.09 mg/dL (ref 0.61–1.24)
GFR, Estimated: >60 mL/min (ref >60)
Glucose, Bld: 118 mg/dL — ABNORMAL HIGH (ref 70–99)
Phosphorus: 3.8 mg/dL (ref 2.5–4.6)
Potassium: 3.1 mmol/L — ABNORMAL LOW (ref 3.5–5.1)
Sodium: 131 mmol/L — ABNORMAL LOW (ref 135–145)

## 2023-01-06 LAB — CBC
HCT: 27.3 % — ABNORMAL LOW (ref 39.0–52.0)
Hemoglobin: 9.2 g/dL — ABNORMAL LOW (ref 13.0–17.0)
MCH: 35.1 pg — ABNORMAL HIGH (ref 26.0–34.0)
MCHC: 33.7 g/dL (ref 30.0–36.0)
MCV: 104.2 fL — ABNORMAL HIGH (ref 80.0–100.0)
Platelets: 193 10*3/uL (ref 150–400)
RBC: 2.62 MIL/uL — ABNORMAL LOW (ref 4.22–5.81)
RDW: 18.6 % — ABNORMAL HIGH (ref 11.5–15.5)
WBC: 6.9 10*3/uL (ref 4.0–10.5)
nRBC: 0 % (ref 0.0–0.2)

## 2023-01-06 LAB — HEPARIN LEVEL (UNFRACTIONATED): Heparin Unfractionated: 0.35 [IU]/mL (ref 0.30–0.70)

## 2023-01-06 MED ORDER — FUROSEMIDE 10 MG/ML IJ SOLN
40.0000 mg | Freq: Two times a day (BID) | INTRAMUSCULAR | Status: DC
  Administered 2023-01-06: 40 mg via INTRAVENOUS
  Filled 2023-01-06 (×2): qty 4

## 2023-01-06 MED ORDER — POTASSIUM CHLORIDE CRYS ER 20 MEQ PO TBCR: 40.0000 meq | EXTENDED_RELEASE_TABLET | Freq: Four times a day (QID) | ORAL | Status: DC

## 2023-01-06 MED ORDER — MIDODRINE HCL 5 MG PO TABS
5.0000 mg | ORAL_TABLET | Freq: Three times a day (TID) | ORAL | Status: DC
  Administered 2023-01-06 (×3): 5 mg via ORAL
  Filled 2023-01-06 (×3): qty 1

## 2023-01-06 MED ORDER — POTASSIUM CHLORIDE CRYS ER 20 MEQ PO TBCR
40.0000 meq | EXTENDED_RELEASE_TABLET | Freq: Once | ORAL | Status: AC
Start: 2023-01-06 — End: 2023-01-06
  Administered 2023-01-06: 40 meq via ORAL
  Filled 2023-01-06: qty 2

## 2023-01-06 MED ORDER — METOPROLOL TARTRATE 25 MG PO TABS
37.5000 mg | ORAL_TABLET | Freq: Four times a day (QID) | ORAL | Status: DC
  Administered 2023-01-06 – 2023-01-08 (×6): 37.5 mg via ORAL
  Filled 2023-01-06 (×6): qty 2

## 2023-01-06 MED ORDER — PEG 3350-KCL-NA BICARB-NACL 420 G PO SOLR
4000.0000 mL | Freq: Once | ORAL | Status: AC
Start: 2023-01-06 — End: 2023-01-06
  Administered 2023-01-06: 4000 mL via ORAL

## 2023-01-06 MED ORDER — THIAMINE MONONITRATE 100 MG PO TABS
100.0000 mg | ORAL_TABLET | Freq: Every day | ORAL | Status: DC
  Administered 2023-01-07 – 2023-01-16 (×10): 100 mg via ORAL
  Filled 2023-01-06 (×9): qty 1

## 2023-01-06 MED ORDER — POTASSIUM CHLORIDE CRYS ER 20 MEQ PO TBCR
40.0000 meq | EXTENDED_RELEASE_TABLET | Freq: Once | ORAL | Status: AC
  Administered 2023-01-06: 40 meq via ORAL
  Filled 2023-01-06: qty 2

## 2023-01-06 MED ORDER — MIDODRINE HCL 5 MG PO TABS
5.0000 mg | ORAL_TABLET | Freq: Once | ORAL | Status: AC
  Administered 2023-01-06: 5 mg via ORAL
  Filled 2023-01-06: qty 1

## 2023-01-06 MED ORDER — FERROUS SULFATE 325 (65 FE) MG PO TABS
325.0000 mg | ORAL_TABLET | Freq: Every day | ORAL | Status: DC
  Administered 2023-01-09: 325 mg via ORAL
  Filled 2023-01-06: qty 1

## 2023-01-06 MED ORDER — MIDODRINE HCL 5 MG PO TABS
10.0000 mg | ORAL_TABLET | Freq: Three times a day (TID) | ORAL | Status: DC
  Administered 2023-01-07 (×3): 10 mg via ORAL
  Filled 2023-01-06 (×3): qty 2

## 2023-01-06 NOTE — TOC Progression Note (Signed)
 Transition of Care [facility]) - Progression Note    Patient Details  Name: Justin Fleming MRN: [id number] Date of Birth: 01/30/1960  [facility] CM/SW Contact  Noreen KATHEE [place], [place] Phone Number: 01/06/2023, 10:31 AM  Clinical Narrative:    CSW spoke with sister [name] and niece at bedside. CSW asked about DC plans and sister stated that she was not sure. Sister shares that pt cannot stay with anyone and that the doctor recommended for pt to sign up for disability. CSW asked sister if someone in the family will be able to assist with disability application and medicaid process and sister declined stating,   I thought that is what the social worker suppose to do. CSW shared process with sister and where she could go to get process started. At this time no LT plans. CSW did inform family that PT will evaluate to recommend further services, but emphasize for them to call to get medicaid application started on pt behalf since pt is unable to do it right now. TOC will continue to follow.      Barriers to Discharge: Continued Medical Work up  Expected Discharge Plan and Services       Living arrangements for the past 2 months: Single Family Home                                       Social Determinants of Health (SDOH) Interventions SDOH Screenings   Food Insecurity: No Food Insecurity (12/30/2022)  Housing: Low Risk  (12/30/2022)  Transportation Needs: No Transportation Needs (12/30/2022)  Utilities: Not At Risk (12/30/2022)  Alcohol Screen: Low Risk  (02/02/2018)  Depression (PHQ2-9): Low Risk  (02/02/2018)  Tobacco Use: Medium Risk (12/30/2022)    Readmission Risk Interventions     No data to display

## 2023-01-06 NOTE — Plan of Care (Signed)

## 2023-01-06 NOTE — Progress Notes (Signed)
 PROGRESS NOTE   Patient: Justin Fleming                            PCP: Patient, No Pcp Per                    DOB: 1960/08/19            DOA: 12/29/2022 FMW:[id number]             DOS: 01/06/2023, 5:20 PM   LOS: 7 days   Date of Service: The patient was seen and examined on 01/06/2023  Subjective:   -intermittently confused, with episodes of disorientation and forgetfulness - Patient Sister [name] and niece [name] at bedside   Brief Narrative:   History limited due to patient's intoxication/AMS, obtained by patient, chart review, EDP Justin Fleming is a 63 y.o. male with hx of alcohol abuse, hypertension, mood disorder, smoking, GERD, who was brought in after bystander contacted EMS because he was sitting in his car in the parking lot.   Found have alcohol intoxication, A-fib with RVR, suspected aspiration pneumonia, and worsening lactic acidosis with fluids.  Clinically suspect decompensated heart failure.  Denies any known history of A-fib.   EKG:  A-fib with RVR rate 159, LAD, LAFB, inferior Q's, poor R wave progression, T wave inversion laterally, no overt ischemic changes.    ED Course:  Found to be in A-fib with RVR, initially treated with metoprolol  2.5 mg IV x 1 with slight worsening of pressures to systolic in the 90s.  Started on heparin  for anticoagulation.  Treated with ceftriaxone /azithromycin , 2 L IV fluid, potassium 40 mg IV and mag 4 g.  At my request consulted with cardiology fellow re: rate control options.  Cards recommending possibly metoprolol  IV prn or digoxin , recommend against amiodarone .     Assessment/Plan:  1)HFpEF --acute diastolic dysfunction CHF-echo as noted below #2 -Clinically appears volume overloaded--+ve JVD, significant peripheral edema including edema of the genitalia -Chest x-ray on admission on 12/30/2022   and repeat chest x-ray from 01/03/2023 showed pulmonary venous congestion and right sided pleural effusion -CTA on 12/31/2022 with  bilateral pleural effusions -Daily weights, fluid input and output monitoring as ordered REDs Vest 01/06/23 -Dyspnea improving -Diuresing well--negative fluid balance -Cardiology consult appreciated -Recommend decreasing Lasix  back to 40 twice daily -Midodrine  added for BP support -Voiding well--- I's and O's not acute due to difficulty with male condom catheter  2)A-fib with RVR -new onset -New onset  in the setting of alcohol intoxication -CHA2DS2-VASc is at least 1 (HTN) -Echo from 12/31/2022 with EF of 55 to 60%, bilateral atrial enlargement noted, no significant valvular abnormalities -Cardiology consult appreciated --TSH -normal at 2.7 -Potassium and magnesium  WNL -Continue IV heparin  for anticoagulation -Rate control remains challenging -Continue IV Cardizem  for rate control--as BP allows -Cardiologist recommends titrating of metoprolol  and added digoxin ---  -Dig level 0.9 on 01/04/23  3)Acute hypoxic respiratory failure--- due to #1 and  #2 above -Clinically and radiologically no significant PNA---- -No leukocytosis, No fevers, PCT Negative -Lactic acidosis improved with hydration -Blood cultures NGTD -Patient was initially treated with Rocephin -- -No Further antibiotics indicated -Hypoxia resolved---currently on room air -Frank Sepsis ruled out  4)Acute metabolic encephalopathy-----due to above -CT Head with mild diffuse atrophy, no acute abnormalities.   --Intermittently confused and disoriented  5)Acute myocardial injury/demand ischemia in the setting of A-fib with RVR -Ischemic demand due to A-fib with RVR -Troponin 39 >>  34, -  EKG is A-fib with RVR, nonspecific changes without overt ischemic changes. -Management of alcohol withdrawal, A-fib RVR, heart failure, borderline hypotension, aspiration pneumonia per above. -Echo from 12/31/2022 with preserved EF, without wall motion abnormalities -Appreciate cardiology recommendations   6)-acute anemia with heme  positive stool--- ??  Upper GI bleed in an alcoholic male--- anemia workup consistent with iron  deficiency, B12 and folate deficiency as well -Appears macrocytic and hyperchromic - Folate 3.6, B12 -158----B12 and folic acid  replacement initiated -- Iron  13, saturation 7% --- iron  supplementation ordered Hgb trended down from 11.5 on admission down to 9.2 (hemoglobin was as low as 8.3 this admission) -Continue IV PPI -GI consult appreciated -Patient is noncompliant with colonoscopy prep (colonoscopy was in 2016, no prior EGD) -Plans for possible EGD on 01/07/2023  7)AKI----acute kidney injury---due to dehydration -  creatinine on admission= 1.31 , - baseline creatinine =1.0     -Creatinine has normalized  -renally adjust medications, avoid nephrotoxic agents / dehydration  / hypotension  8)Social/Ethics--- prior to admission lived alone, admits to heavy alcohol abuse -Patient Sister [name] is primary contact  9)Alcohol intoxication-- -Reports drinking up to 1.75 L of liquor a day --Patient completed Librium  taper,  , continue folic acid  thiamine  and multivitamin   10)Fatty Liver with mild ascites--- in the setting of chronic alcohol abuse -Right upper quadrant ultrasound from 01/04/2023 noted -Supportive management -Diuretics as above  11)Asymptomatic cholelithiasis----right upper quadrant ultrasound shows.. Contracted gallbladder with stones and sludge. No ductal dilatation.   12) capacity evaluation----Justin Fleming is  a  63 y.o.  who is AAO x 3 without significant depressive symptoms and without delusion/psychosis who is unable to understand (without significant language barrier) his current medical diagnosis, patient also unable to accurately verbalizes understanding of proposed treatment options including option of no treatment, the consequences/risk versus benefit of each treatment option, alternatives as well as the option of no treatment. -- Based on my evaluation Justin L  Fleming  appears to Not have the Capacity to make decisions or give informed consent about his medical care.  A surrogate decision-maker is required as Justin Fleming  appears to Not have capacity to make his own decisions and give informed consent regarding his medical care ---Patient remains forgetful and intermittently confused -Patient sisters Ms. [name] and Mary at bedside--- they have offered to make medical decisions for patient  ----------------------------------------------------------------------------------------------------------------------------------------------- Nutritional status:  The patient's BMI is: Body mass index is 29.77 kg/m. I agree with the assessment and plan as outlined below: Nutrition Status:  DVT prophylaxis:  Heparin  drip  Code Status:   Code Status: Full Code  Family Communication: Discussed with sister  Admission status:    Inpatient  Disposition: From  - home             Planning for discharge to SNF rehab Vs Home with [facility] Procedures:   Antimicrobials:  Anti-infectives (From admission, onward)    Start     Dose/Rate Route Frequency Ordered Stop   12/31/22 0000  cefTRIAXone  (ROCEPHIN ) 2 g in sodium chloride  0.9 % 100 mL IVPB        2 g 200 mL/hr over 30 Minutes Intravenous Every 24 hours 12/30/22 0443 01/02/23 0013   12/30/22 2200  azithromycin  (ZITHROMAX ) tablet 500 mg        500 mg Oral Daily at bedtime 12/30/22 0443 12/31/22 2112   12/30/22 0000  cefTRIAXone  (ROCEPHIN ) 2 g in sodium chloride  0.9 % 100 mL IVPB  2 g 200 mL/hr over 30 Minutes Intravenous  Once 12/29/22 2345 12/30/22 0100   12/30/22 0000  azithromycin  (ZITHROMAX ) 500 mg in sodium chloride  0.9 % 250 mL IVPB        500 mg 250 mL/hr over 60 Minutes Intravenous  Once 12/29/22 2345 12/30/22 0158      Medication:   Chlorhexidine  Gluconate Cloth  6 each Topical Q0600   cyanocobalamin   1,000 mcg Intramuscular Once   vitamin B-12  1,000 mcg Oral Daily   digoxin    0.25 mg Oral Daily   [START ON 01/09/2023] ferrous sulfate   325 mg Oral Q breakfast   folic acid   1 mg Oral Daily   furosemide   40 mg Intravenous Q12H   metoprolol  tartrate  37.5 mg Oral QID   midodrine   5 mg Oral TID WC   multivitamin with minerals  1 tablet Oral Daily   pantoprazole  (PROTONIX ) IV  40 mg Intravenous Q12H   polyethylene glycol  17 g Oral Daily   sodium chloride  flush  10-40 mL Intracatheter Q12H   sodium chloride  flush  3 mL Intravenous Q12H   sucralfate   1 g Oral TID WC & HS   [START ON 01/07/2023] thiamine   100 mg Oral Daily   acetaminophen , mouth rinse, oxyCODONE , sodium chloride  flush  Objective:   Vitals:   01/06/23 1209 01/06/23 1300 01/06/23 1325 01/06/23 1707  BP:  93/66 101/78 (!) 128/94  Pulse:  85 87 (!) 109  Resp:  16  17  Temp: 97.9 F (36.6 C)     TempSrc: Oral     SpO2:  100%  100%  Weight:      Height:        Intake/Output Summary (Last 24 hours) at 01/06/2023 1720 Last data filed at 01/06/2023 1605 Gross per 24 hour  Intake 1289.61 ml  Output 6950 ml  Net -5660.39 ml   Filed Weights   01/04/23 0500 01/05/23 0500 01/06/23 0400  Weight: 101 kg 99.4 kg 94.1 kg    Physical examination:    Physical Exam Gen:- Awake Alert, intermittently confused and forgetful  HEENT:- [place].AT, No sclera icterus Neck-Supple Neck,  +ve JVD,.  Lungs-improving air movement,  +ve bibasilar rales  CV- S1, S2 normal, RRR Abd-  +ve B.Sounds, Abd Soft, No tenderness,    Extremity/Skin:- +ve  edema, good pedal pulses  Psych-affect is flat, oriented x3, forgetful with intermittent confusion and disorientation Neuro-generalized weakness, no new focal deficits, mild tremors GU-Penile and Scrotal Edema MSK--Rt arm PICC Line in situ Wounds: Please see nursing documentation  LABs:     Latest Ref Rng & Units 01/06/2023    4:48 AM 01/05/2023    4:32 AM 01/04/2023    3:20 AM  CBC  WBC 4.0 - 10.5 K/uL 6.9  6.5  7.6   Hemoglobin 13.0 - 17.0 g/dL 9.2  8.3  8.4    Hematocrit 39.0 - 52.0 % 27.3  25.8  26.1   Platelets 150 - 400 K/uL 193  138  112       Latest Ref Rng & Units 01/06/2023    4:48 AM 01/04/2023    3:20 AM 01/03/2023    5:16 AM  CMP  Glucose 70 - 99 mg/dL 881  874    874  875   BUN 8 - 23 mg/dL 12  16    16  15    Creatinine 0.61 - 1.24 mg/dL 8.90  8.89    8.92  8.95   Sodium 135 -  145 mmol/L 131  137    138  139   Potassium 3.5 - 5.1 mmol/L 3.1  3.9    3.9  4.3   Chloride 98 - 111 mmol/L 85  99    98  102   CO2 22 - 32 mmol/L 35  31    31  31    Calcium 8.9 - 10.3 mg/dL 8.6  8.4    8.5  8.4    Micro Results Recent Results (from the past 240 hours)  Culture, blood (routine x 2)     Status: None   Collection Time: 12/30/22 12:51 AM   Specimen: BLOOD  Result Value Ref Range Status   Specimen Description BLOOD BLOOD RIGHT ARM  Final   Special Requests   Final    BOTTLES DRAWN AEROBIC ONLY Blood Culture results may not be optimal due to an inadequate volume of blood received in culture bottles   Culture   Final    NO GROWTH 5 DAYS Performed at [facility], [address] [place], [place] [address]    Report Status 01/04/2023 FINAL  Final  Culture, blood (routine x 2)     Status: None   Collection Time: 12/30/22 12:55 AM   Specimen: BLOOD LEFT FOREARM  Result Value Ref Range Status   Specimen Description BLOOD LEFT FOREARM  Final   Special Requests   Final    BOTTLES DRAWN AEROBIC AND ANAEROBIC Blood Culture results may not be optimal due to an inadequate volume of blood received in culture bottles   Culture   Final    NO GROWTH 5 DAYS Performed at [facility], [address], [place], [place] [address]    Report Status 01/04/2023 FINAL  Final  Respiratory (~20 pathogens) panel by PCR     Status: None   Collection Time: 12/30/22  3:59 AM   Specimen: Nasopharyngeal Swab; Respiratory  Result Value Ref Range Status   Adenovirus NOT DETECTED NOT DETECTED Final   Coronavirus 229E NOT DETECTED NOT DETECTED Final     Comment: (NOTE) The Coronavirus on the Respiratory Panel, DOES NOT test for the novel  Coronavirus (2019 nCoV)    Coronavirus HKU1 NOT DETECTED NOT DETECTED Final   Coronavirus NL63 NOT DETECTED NOT DETECTED Final   Coronavirus OC43 NOT DETECTED NOT DETECTED Final   Metapneumovirus NOT DETECTED NOT DETECTED Final   Rhinovirus / Enterovirus NOT DETECTED NOT DETECTED Final   Influenza A NOT DETECTED NOT DETECTED Final   Influenza B NOT DETECTED NOT DETECTED Final   Parainfluenza Virus 1 NOT DETECTED NOT DETECTED Final   Parainfluenza Virus 2 NOT DETECTED NOT DETECTED Final   Parainfluenza Virus 3 NOT DETECTED NOT DETECTED Final   Parainfluenza Virus 4 NOT DETECTED NOT DETECTED Final   Respiratory Syncytial Virus NOT DETECTED NOT DETECTED Final   Bordetella pertussis NOT DETECTED NOT DETECTED Final   Bordetella Parapertussis NOT DETECTED NOT DETECTED Final   Chlamydophila pneumoniae NOT DETECTED NOT DETECTED Final   Mycoplasma pneumoniae NOT DETECTED NOT DETECTED Final    Comment: Performed at [facility], [address], [place], [place] [address]  MRSA Next Gen by PCR, Nasal     Status: None   Collection Time: 12/30/22  9:06 AM   Specimen: Nasal Mucosa; Nasal Swab  Result Value Ref Range Status   MRSA by PCR Next Gen NOT DETECTED NOT DETECTED Final    Comment: (NOTE) The GeneXpert MRSA Assay (FDA approved for NASAL specimens only), is one component  of a comprehensive MRSA colonization surveillance program. It is not intended to diagnose MRSA infection nor to guide or monitor treatment for MRSA infections. Test performance is not FDA approved in patients less than 51 years old. Performed at [facility], [address], [place], [place] [address]   SARS Coronavirus 2 by RT PCR (hospital order, performed in [facility] hospital lab) *cepheid single result test* Anterior Nasal Swab     Status: None   Collection Time: 12/31/22 12:31 PM   Specimen: Anterior Nasal Swab  Result  Value Ref Range Status   SARS Coronavirus 2 by RT PCR NEGATIVE NEGATIVE Final    Comment: (NOTE) SARS-CoV-2 target nucleic acids are NOT DETECTED.  The SARS-CoV-2 RNA is generally detectable in upper and lower respiratory specimens during the acute phase of infection. The lowest concentration of SARS-CoV-2 viral copies this assay can detect is 250 copies / mL. A negative result does not preclude SARS-CoV-2 infection and should not be used as the sole basis for treatment or other patient management decisions.  A negative result may occur with improper specimen collection / handling, submission of specimen other than nasopharyngeal swab, presence of viral mutation(s) within the areas targeted by this assay, and inadequate number of viral copies (<250 copies / mL). A negative result must be combined with clinical observations, patient history, and epidemiological information.  Fact Sheet for Patients:   roadlaptop.co.za  Fact Sheet for Healthcare Providers: http://kim-miller.com/  This test is not yet approved or  cleared by the [place]  FDA and has been authorized for detection and/or diagnosis of SARS-CoV-2 by FDA under an Emergency Use Authorization (EUA).  This EUA will remain in effect (meaning this test can be used) for the duration of the COVID-19 declaration under Section 564(b)(1) of the Act, 21 U.S.C. section 360bbb-3(b)(1), unless the authorization is terminated or revoked sooner.  Performed at [facility], [address], [place], [place] [address]   Culture, blood (Routine X 2) w Reflex to ID Panel     Status: None (Preliminary result)   Collection Time: 01/04/23  4:19 PM   Specimen: BLOOD LEFT HAND  Result Value Ref Range Status   Specimen Description BLOOD LEFT HAND  Final   Special Requests   Final    BOTTLES DRAWN AEROBIC AND ANAEROBIC Blood Culture adequate volume   Culture   Final    NO GROWTH 2 DAYS Performed  at [facility], [address], [place], [place] [address]    Report Status PENDING  Incomplete  Culture, blood (Routine X 2) w Reflex to ID Panel     Status: None (Preliminary result)   Collection Time: 01/04/23  4:19 PM   Specimen: BLOOD LEFT WRIST  Result Value Ref Range Status   Specimen Description BLOOD LEFT WRIST  Final   Special Requests   Final    BOTTLES DRAWN AEROBIC AND ANAEROBIC Blood Culture adequate volume   Culture   Final    NO GROWTH 2 DAYS Performed at [facility], [address] [place], [place] [address]    Report Status PENDING  Incomplete   SIGNED: Rendall Carwin, MD,  - [facility],  Pager (please use [email or web address] to page/ text) Please use Epic Secure Chat for non-urgent communication (7AM-7PM)  If 7PM-7AM, please contact night-coverage [email or web address], 01/06/2023, 5:20 PM

## 2023-01-06 NOTE — Progress Notes (Signed)
 PHARMACY - ANTICOAGULATION CONSULT NOTE  Pharmacy Consult for heparin   Indication: atrial fibrillation  No Known Allergies  Patient Measurements: Height: 5' 10 (177.8 cm) Weight: 94.1 kg (207 lb 7.3 oz) IBW/kg (Calculated) : 73 Heparin  Dosing Weight: 93 kg  Vital Signs: Temp: 98.4 F (36.9 C) (01/01 0753) Temp Source: Oral (01/01 0753) BP: 102/71 (01/01 0842) Pulse Rate: 92 (01/01 0843)  Labs: Recent Labs    01/04/23 0320 01/04/23 1132 01/04/23 2001 01/05/23 0432 01/06/23 0448  HGB 8.4*  --   --  8.3* 9.2*  HCT 26.1*  --   --  25.8* 27.3*  PLT 112*  --   --  138* 193  HEPARINUNFRC 0.36   < > 0.35 0.34 0.35  CREATININE 1.07  1.10  --   --   --  1.09   < > = values in this interval not displayed.    Estimated Creatinine Clearance: 78.8 mL/min (by C-G formula based on SCr of 1.09 mg/dL).  Assessment: Justin Fleming is a 63 y.o. year old male admitted on 12/29/2022 with concern for new onset afib with RVR. Currently on diltiazem  drip for rate control. No anticoagulation prior to admission. Pharmacy initially consulted to transition from heparin  to apixaban , but then relented with drop in Hgb and the possibility of additional intervention. Therefore, patient back on heparin . He had received one dose of Eliquis . CHA2DS2VASc is at least 2 (HTN, HFpEF).  Patient continues on IV heparin . Level has remained at goal. Hgb improved to 9.2. No bleeding issues noted.   Goal of Therapy:  Heparin  level 0.3-0.7 units/ml Monitor platelets by anticoagulation protocol: Yes  Plan: Continue heparin  infusion at 2250 units/hr Heparin  level daily Continue to monitor H&H and platelets.  Dempsey Blush PharmD., BCPS Clinical Pharmacist 01/06/2023 10:48 AM

## 2023-01-06 NOTE — Progress Notes (Signed)
 MD Courage notified, patient refusing to drink bowel prep. Bowel prep explained and encouraged. Sister at bedside encouraging patient to drink. Per MD keep patient NPO at midnight, and continue to try and encourage bowel prep. If refusal continues... then still plan for EGD tom, no colonoscopy.

## 2023-01-06 NOTE — Plan of Care (Addendum)
 Patient remains on AP-2B at time of writing. Patient is AA+Ox4; CIWA assessment still ordered for Q 4 hours. The patient's admission profile was completed overnight by this RN.  Regarding the patient's heart rate, a secure chat was sent by this RN to Dr. Keturah at 1948 hours, stating: S: How to proceed with dilt gtt? B: Patient is here with multiple issues including AF w/RVR. Was on a Dilt gtt earlier today, but has been off since ~ 2pm. Has received PO metop and digoxin  since. A: still in RVR 110s,-140s, BP 106/69 (78). R: still has the order for dilt gtt even though it was stopped, would you like for us  to restart it versus DC the order?. A reply to this RN from Dr. Segars was received via secure chat at 1954 hours: would aim for <130 for now. Dont think BP will tolerate Dilt + the metop. So id stick w metop and digoxin  for now and new orders were acknowledged. Regarding the patient's scheduled EGD and colonoscopy, the patient states I know that's what they want to do but will talk to my sisters tomorrow. Consent not able to be obtained overnight due to the patient's mentation (see Dr. Arelia note) and unable to contact family. Fall/injury prevention measures were also initiated overnight by this RN, including adding a bed alarm, yellow falls risk arm band, and floor mats. At 2233 hours, a secure chat was sent by this RN to Dr. Keturah stating: RICK, after the patient got his PM metop, his HR is better ~110s-120s but his BP is now soft 80s/50s, MAP right at 60, so just wanted to let you know. A reply to this RN from Dr. Segars was received via secure chat at 2233 hours stating: going to go up on midodrine  to 10 and new orders were acknowledged. PICC line dressing, caps, and IV tubing changed overnight; per protocol. Regarding the patient's 0600 hours dose of Lasix , this dose was held per Dr. Segars as the patient's BP remains borderline low.    Problem: Education: Goal: Knowledge of General  Education information will improve Description: Including pain rating scale, medication(s)/side effects and non-pharmacologic comfort measures Outcome: Progressing   Problem: Health Behavior/Discharge Planning: Goal: Ability to manage health-related needs will improve Outcome: Progressing   Problem: Clinical Measurements: Goal: Ability to maintain clinical measurements within normal limits will improve Outcome: Progressing Goal: Will remain free from infection Outcome: Progressing Goal: Diagnostic test results will improve Outcome: Progressing Goal: Respiratory complications will improve Outcome: Progressing Goal: Cardiovascular complication will be avoided Outcome: Progressing   Problem: Activity: Goal: Risk for activity intolerance will decrease Outcome: Progressing   Problem: Nutrition: Goal: Adequate nutrition will be maintained Outcome: Progressing   Problem: Coping: Goal: Level of anxiety will decrease Outcome: Progressing   Problem: Elimination: Goal: Will not experience complications related to bowel motility Outcome: Progressing Goal: Will not experience complications related to urinary retention Outcome: Progressing   Problem: Pain Management: Goal: General experience of comfort will improve Outcome: Progressing   Problem: Safety: Goal: Ability to remain free from injury will improve Outcome: Progressing   Problem: Skin Integrity: Goal: Risk for impaired skin integrity will decrease Outcome: Progressing

## 2023-01-06 NOTE — Progress Notes (Signed)
 Justin Fleming, M.D. Gastroenterology & Hepatology   Interval History: No acute events overnight. Patient reports feeling well denies any complaints.  States that he does not believe he will be willing to drink the bowel prep. Thorough discussion was held between hospitalist and family regarding the lack of capacity for the patient to make decisions.  Consent was signed to proceed with EGD and colonoscopy tomorrow. Hemoglobin remains stable at 9.2.  No melena or hematochezia.  Inpatient Medications:  Current Facility-Administered Medications:    acetaminophen  (TYLENOL ) tablet 650 mg, 650 mg, Oral, Q6H PRN, Emokpae, Courage, MD, 650 mg at 01/05/23 0847   Chlorhexidine  Gluconate Cloth 2 % PADS 6 each, 6 each, Topical, Q0600, Shahmehdi, Seyed A, MD, 6 each at 01/06/23 0539   cyanocobalamin  (VITAMIN B12) injection 1,000 mcg, 1,000 mcg, Intramuscular, Once, Emokpae, Courage, MD   cyanocobalamin  (VITAMIN B12) tablet 1,000 mcg, 1,000 mcg, Oral, Daily, Shahmehdi, Seyed A, MD, 1,000 mcg at 01/06/23 9157   digoxin  (LANOXIN ) tablet 0.25 mg, 0.25 mg, Oral, Daily, Okey Moccasin V, MD, 0.25 mg at 01/06/23 9156   diltiazem  (CARDIZEM ) 125 mg in dextrose  5% 125 mL (1 mg/mL) infusion, 5-15 mg/hr, Intravenous, Titrated, Debera Jayson MATSU, MD, Last Rate: 10 mL/hr at 01/06/23 0423, 10 mg/hr at 01/06/23 0423   ferrous sulfate  tablet 325 mg, 325 mg, Oral, Q breakfast, Emokpae, Courage, MD, 325 mg at 01/06/23 0844   folic acid  (FOLVITE ) tablet 1 mg, 1 mg, Oral, Daily, Segars, Dorn, MD, 1 mg at 01/06/23 9157   furosemide  (LASIX ) injection 40 mg, 40 mg, Intravenous, Q12H, Branch, Dorn FALCON, MD   heparin  ADULT infusion 100 units/mL (25000 units/250mL), 2,250 Units/hr, Intravenous, Continuous, Emokpae, Courage, MD, Last Rate: 22.5 mL/hr at 01/06/23 1117, 2,250 Units/hr at 01/06/23 1117   metoprolol  tartrate (LOPRESSOR ) tablet 37.5 mg, 37.5 mg, Oral, QID, Alvan Dorn FALCON, MD, 37.5 mg at 01/06/23 9157   midodrine   (PROAMATINE ) tablet 5 mg, 5 mg, Oral, TID WC, Alvan Dorn FALCON, MD, 5 mg at 01/06/23 1115   multivitamin with minerals tablet 1 tablet, 1 tablet, Oral, Daily, Segars, Dorn, MD, 1 tablet at 01/06/23 0844   Oral care mouth rinse, 15 mL, Mouth Rinse, PRN, Emokpae, Courage, MD   oxyCODONE  (Oxy IR/ROXICODONE ) immediate release tablet 5 mg, 5 mg, Oral, Q8H PRN, Emokpae, Courage, MD, 5 mg at 01/06/23 0844   pantoprazole  (PROTONIX ) injection 40 mg, 40 mg, Intravenous, Q12H, Emokpae, Courage, MD, 40 mg at 01/06/23 0841   polyethylene glycol (MIRALAX  / GLYCOLAX ) packet 17 g, 17 g, Oral, Daily, Shahmehdi, Seyed A, MD, 17 g at 01/06/23 0841   sodium chloride  flush (NS) 0.9 % injection 10-40 mL, 10-40 mL, Intracatheter, Q12H, Shahmehdi, Seyed A, MD, 10 mL at 01/06/23 0846   sodium chloride  flush (NS) 0.9 % injection 10-40 mL, 10-40 mL, Intracatheter, PRN, Shahmehdi, Seyed A, MD   sodium chloride  flush (NS) 0.9 % injection 3 mL, 3 mL, Intravenous, Q12H, Segars, Dorn, MD, 3 mL at 01/06/23 0846   sucralfate  (CARAFATE ) 1 GM/10ML suspension 1 g, 1 g, Oral, TID WC & HS, Emokpae, Courage, MD, 1 g at 01/06/23 1115   thiamine  (VITAMIN B1) 500 mg in sodium chloride  0.9 % 50 mL IVPB, 500 mg, Intravenous, Q8H, Segars, Dorn, MD, Last Rate: 110 mL/hr at 01/06/23 0517, 500 mg at 01/06/23 0517   I/O    Intake/Output Summary (Last 24 hours) at 01/06/2023 1151 Last data filed at 01/06/2023 1004 Gross per 24 hour  Intake 1158.65 ml  Output 6100 ml  Net -4941.35 ml     Physical Exam: Temp:  [98.2 F (36.8 C)-98.9 F (37.2 C)] 98.4 F (36.9 C) (01/01 0753) Pulse Rate:  [80-120] 92 (01/01 0843) Resp:  [8-24] 16 (01/01 0700) BP: (87-116)/(57-78) 102/71 (01/01 0842) SpO2:  [90 %-100 %] 100 % (01/01 0700) Weight:  [94.1 kg] 94.1 kg (01/01 0400)  Temp (24hrs), Avg:98.5 F (36.9 C), Min:98.2 F (36.8 C), Max:98.9 F (37.2 C)  GENERAL: The patient is AO x3, in no acute distress. On [place]. HEENT: Head is  normocephalic and atraumatic. EOMI are intact. Mouth is well hydrated and without lesions. NECK: Supple. No masses LUNGS: Decreased breath sounds bilaterally. HEART: RRR, normal s1 and s2. ABDOMEN: Soft, nontender, no guarding, no peritoneal signs, and nondistended. BS +. No masses. EXTREMITIES: Without any cyanosis, clubbing, rash, lesions or edema. NEUROLOGIC: AOx3, no focal motor deficit. SKIN: no jaundice, no rashes  Laboratory Data: CBC:     Component Value Date/Time   WBC 6.9 01/06/2023 0448   RBC 2.62 (L) 01/06/2023 0448   HGB 9.2 (L) 01/06/2023 0448   HCT 27.3 (L) 01/06/2023 0448   PLT 193 01/06/2023 0448   MCV 104.2 (H) 01/06/2023 0448   MCH 35.1 (H) 01/06/2023 0448   MCHC 33.7 01/06/2023 0448   RDW 18.6 (H) 01/06/2023 0448   LYMPHSABS 1.6 12/30/2022 0001   MONOABS 0.3 12/30/2022 0001   EOSABS 0.0 12/30/2022 0001   BASOSABS 0.1 12/30/2022 0001   COAG: No results found for: INR, PROTIME  BMP:     Latest Ref Rng & Units 01/06/2023    4:48 AM 01/04/2023    3:20 AM 01/03/2023    5:16 AM  BMP  Glucose 70 - 99 mg/dL 881  874    874  875   BUN 8 - 23 mg/dL 12  16    16  15    Creatinine 0.61 - 1.24 mg/dL 8.90  8.89    8.92  8.95   Sodium 135 - 145 mmol/L 131  137    138  139   Potassium 3.5 - 5.1 mmol/L 3.1  3.9    3.9  4.3   Chloride 98 - 111 mmol/L 85  99    98  102   CO2 22 - 32 mmol/L 35  31    31  31    Calcium 8.9 - 10.3 mg/dL 8.6  8.4    8.5  8.4     HEPATIC:     Latest Ref Rng & Units 01/06/2023    4:48 AM 01/04/2023    3:20 AM 12/30/2022   12:01 AM  Hepatic Function  Total Protein 6.5 - 8.1 g/dL   7.0   Albumin  3.5 - 5.0 g/dL 2.4  2.4  3.1   AST 15 - 41 U/L   22   ALT 0 - 44 U/L   10   Alk Phosphatase 38 - 126 U/L   75   Total Bilirubin <1.2 mg/dL   1.1     CARDIAC: No results found for: CKTOTAL, CKMB, CKMBINDEX, TROPONINI    Imaging: I personally reviewed and interpreted the available labs, imaging and endoscopic files.    Assessment/Plan: 63 year old male with history of alcohol use disorder, depression, tobacco abuse, GERD, HTN, who presented with AMS. Patient is admitted with acute decompensated heart failure with A-fib with RVR. GI consulted for downtrending hemoglobin in setting of clinical need for ACs given A-fib.   The patient has presented fluctuation of his hemoglobin, which has downtrended  from 11 range down to the low eights but without any overt GI bleeding.  Has not required transfusions in the past.  However, there is need for systemic anticoagulation as part of the plan after the cardioversion for management of A-fib with RVR.  A thorough discussion was held with the primary team and anesthesia.  Patient to undergo endoscopy evaluation with EGD and colonoscopy.  The patient has been evaluated by the primary team and family -  patient was considered to not have capacity to make decisions and was consented for EGD and colonoscopy.  He will be scheduled for both procedures  - he may not be willing to drink the bowel prep, at which point esophagogastroduodenospy will be the only procedure performed.  She will undergo gastric biopsies at that time.  However, I explained to the primary team that if no source of anemia is found  with the esophagogastroduodenospy, there is a risk of rebleeding and anemia once anticoagulation is started as colonoscopy will still be warranted and discussion about the risk versus benefit of anticoagulation should be held at that time.  Given soft blood pressures, may need to optimize this prior to proceeding with this, tentatively he will undergo EGD and colonoscopy tomorrow. Will need to continue with PPI twice daily for now. Will need to hold heparin  at 6 AM.  Continue to monitor H&H and for overt GI bleeding. Continue with B12, folate, and iron  supplementation.  Continue IV PPI BID.  EGD and colonoscopy on Thursday if patient remains stable from a cardiac standpoint   Justin Fortune, MD Gastroenterology and Hepatology [facility]

## 2023-01-06 NOTE — Progress Notes (Signed)
 Chart reviewed remotely. Continue to work to wean diltiazem  drip. Increase midodrine  to 5mg  tid to allow room with bp for av nodal agents, increase lopressor  to 37.5mg  every 6 hours, wean dilt gtt to keep HRs <110. Continue IV diuresis, very large diuresis yesterday negative 5.4 L. Would slow down rate of diuresis, lower lasix  to 40mg  bid   Dorn Ross MD

## 2023-01-07 ENCOUNTER — Encounter (HOSPITAL_COMMUNITY)
Admission: EM | Disposition: A | Discharge status: Skilled Nursing Facility | Payer: Self-pay | Source: Home / Self Care | Attending: Family Medicine

## 2023-01-07 ENCOUNTER — Encounter (HOSPITAL_COMMUNITY): Payer: Self-pay | Admitting: Internal Medicine

## 2023-01-07 ENCOUNTER — Encounter: Payer: Self-pay | Admitting: Gastroenterology

## 2023-01-07 DIAGNOSIS — I5031 Acute diastolic (congestive) heart failure: Secondary | ICD-10-CM | POA: Diagnosis not present

## 2023-01-07 DIAGNOSIS — Z7189 Other specified counseling: Secondary | ICD-10-CM

## 2023-01-07 DIAGNOSIS — Z515 Encounter for palliative care: Secondary | ICD-10-CM

## 2023-01-07 DIAGNOSIS — E538 Deficiency of other specified B group vitamins: Secondary | ICD-10-CM | POA: Diagnosis present

## 2023-01-07 DIAGNOSIS — F17200 Nicotine dependence, unspecified, uncomplicated: Secondary | ICD-10-CM

## 2023-01-07 DIAGNOSIS — R7989 Other specified abnormal findings of blood chemistry: Secondary | ICD-10-CM | POA: Diagnosis present

## 2023-01-07 LAB — RENAL FUNCTION PANEL
Albumin: 2.4 g/dL — ABNORMAL LOW (ref 3.5–5.0)
Anion gap: 9 (ref 5–15)
BUN: 11 mg/dL (ref 8–23)
CO2: 38 mmol/L — ABNORMAL HIGH (ref 22–32)
Calcium: 8.7 mg/dL — ABNORMAL LOW (ref 8.9–10.3)
Chloride: 82 mmol/L — ABNORMAL LOW (ref 98–111)
Creatinine, Ser: 0.88 mg/dL (ref 0.61–1.24)
GFR, Estimated: >60 mL/min (ref >60)
Glucose, Bld: 113 mg/dL — ABNORMAL HIGH (ref 70–99)
Phosphorus: 3.5 mg/dL (ref 2.5–4.6)
Potassium: 3.8 mmol/L (ref 3.5–5.1)
Sodium: 129 mmol/L — ABNORMAL LOW (ref 135–145)

## 2023-01-07 LAB — CBC
HCT: 28.9 % — ABNORMAL LOW (ref 39.0–52.0)
Hemoglobin: 9.8 g/dL — ABNORMAL LOW (ref 13.0–17.0)
MCH: 34.9 pg — ABNORMAL HIGH (ref 26.0–34.0)
MCHC: 33.9 g/dL (ref 30.0–36.0)
MCV: 102.8 fL — ABNORMAL HIGH (ref 80.0–100.0)
Platelets: 257 10*3/uL (ref 150–400)
RBC: 2.81 MIL/uL — ABNORMAL LOW (ref 4.22–5.81)
RDW: 18.3 % — ABNORMAL HIGH (ref 11.5–15.5)
WBC: 8.2 10*3/uL (ref 4.0–10.5)
nRBC: 0 % (ref 0.0–0.2)

## 2023-01-07 LAB — HEPARIN LEVEL (UNFRACTIONATED): Heparin Unfractionated: 0.35 [IU]/mL (ref 0.30–0.70)

## 2023-01-07 LAB — MAGNESIUM: Magnesium: 1.4 mg/dL — ABNORMAL LOW (ref 1.7–2.4)

## 2023-01-07 SURGERY — CANCELLED PROCEDURE
Anesthesia: Monitor Anesthesia Care

## 2023-01-07 SURGERY — ESOPHAGOGASTRODUODENOSCOPY (EGD) WITH PROPOFOL
Anesthesia: Choice

## 2023-01-07 MED ORDER — HEPARIN (PORCINE) 25000 UT/250ML-% IV SOLN
2250.0000 [IU]/h | INTRAVENOUS | Status: AC
  Administered 2023-01-07 – 2023-01-08 (×3): 2250 [IU]/h via INTRAVENOUS
  Filled 2023-01-07 (×2): qty 250

## 2023-01-07 MED ORDER — FUROSEMIDE 10 MG/ML IJ SOLN: 40.0000 mg | Freq: Once | INTRAMUSCULAR | Status: DC

## 2023-01-07 MED ORDER — SODIUM CHLORIDE 0.9 % IV SOLN: INTRAVENOUS | Status: AC

## 2023-01-07 MED ORDER — MAGNESIUM SULFATE 4 GM/100ML IV SOLN
4.0000 g | Freq: Once | INTRAVENOUS | Status: AC
Start: 2023-01-07 — End: 2023-01-07
  Administered 2023-01-07: 4 g via INTRAVENOUS
  Filled 2023-01-07: qty 100

## 2023-01-07 MED ORDER — ALBUMIN HUMAN 5 % IV SOLN
25.0000 g | Freq: Once | INTRAVENOUS | Status: AC
  Administered 2023-01-07: 25 g via INTRAVENOUS
  Filled 2023-01-07: qty 500

## 2023-01-07 MED ORDER — SODIUM CHLORIDE 0.9 % IV SOLN: INTRAVENOUS | Status: DC

## 2023-01-07 MED ORDER — PEG 3350-KCL-NA BICARB-NACL 420 G PO SOLR
4000.0000 mL | Freq: Once | ORAL | Status: AC
Start: 2023-01-07 — End: 2023-01-07
  Administered 2023-01-07: 4000 mL via ORAL

## 2023-01-07 NOTE — Progress Notes (Signed)
 patient now refusing midodrine , spoke with him and said his blood pressure is really low and he needs it, he stated Let it stay low then ; Reinforced that he may pass if his blood pressure continues to drop and he stated  Is what it is . MD Courage aware, states patient does not have capacity at this time. Holding Midodrine  at this time.

## 2023-01-07 NOTE — Progress Notes (Addendum)
 PROGRESS NOTE   Patient: Justin Fleming                            PCP: Patient, No Pcp Per                    DOB: 11/17/1960            DOA: 12/29/2022 FMW:[id number]             DOS: 01/07/2023, 10:24 AM   LOS: 8 days   Date of Service: The patient was seen and examined on 01/07/2023  Subjective:   -intermittently confused, with episodes of disorientation and forgetfulness  -Refusing medications----intermittently uncooperative and noncompliant  Patient Sister Justin Fleming and nephew Justin Fleming at bedside   Brief Narrative:   History limited due to patient's intoxication/AMS, obtained by patient, chart review, EDP Justin Fleming is a 63 y.o. male with hx of alcohol abuse, hypertension, mood disorder, smoking, GERD, who was brought in after bystander contacted EMS because he was sitting in his car in the parking lot.   Found have alcohol intoxication, A-fib with RVR, suspected aspiration pneumonia, and worsening lactic acidosis with fluids.  Clinically suspect decompensated heart failure.  Denies any known history of A-fib.   EKG:  A-fib with RVR rate 159, LAD, LAFB, inferior Q's, poor R wave progression, T wave inversion laterally, no overt ischemic changes.    ED Course:  Found to be in A-fib with RVR, initially treated with metoprolol  2.5 mg IV x 1 with slight worsening of pressures to systolic in the 90s.  Started on heparin  for anticoagulation.  Treated with ceftriaxone /azithromycin , 2 L IV fluid, potassium 40 mg IV and mag 4 g.  At my request consulted with cardiology fellow re: rate control options.  Cards recommending possibly metoprolol  IV prn or digoxin , recommend against amiodarone .     Assessment/Plan:  1)HFpEF --acute diastolic dysfunction CHF-echo as noted below #2 -Clinically appears volume overloaded--+ve JVD, significant peripheral edema including edema of the genitalia -Chest x-ray on admission on 12/30/2022   and repeat chest x-ray from 01/03/2023 showed pulmonary  venous congestion and right sided pleural effusion -CTA on 12/31/2022 with bilateral pleural effusions -Daily weights, fluid input and output monitoring as ordered REDs Vest 01/07/23 -Dyspnea improving -Diuresing well--negative fluid balance -Cardiology consult appreciated -Lasix  on hold due to BP concerns -Midodrine  added for BP support -Voiding well--- I's and O's not acute due to difficulty with male condom catheter  2)A-fib with RVR -new onset -New onset  in the setting of alcohol intoxication -CHA2DS2-VASc is at least 1 (HTN) -Echo from 12/31/2022 with EF of 55 to 60%, bilateral atrial enlargement noted, no significant valvular abnormalities -Cardiology consult appreciated --TSH -normal at 2.7 -Potassium and magnesium  WNL -Continue IV heparin  for anticoagulation -Rate control remains challenging -Continue IV Cardizem  for rate control--as BP allows -Cardiologist recommends titration of metoprolol  and added digoxin ---  -Dig level 0.9 on 01/04/23 -Not a candidate for cardioversion as to be difficult to commit to long-term anticoagulation post cardioversion in the setting of GI bleed without endoluminal evaluation  3)Acute hypoxic respiratory failure--- due to #1 and  #2 above -Clinically and radiologically no significant PNA---- -No leukocytosis, No fevers, PCT Negative -Lactic acidosis improved with hydration -Blood cultures NGTD -Patient was initially treated with Rocephin -- -No Further antibiotics indicated -Hypoxia resolved---currently on room air -Frank Sepsis ruled out  4)Acute metabolic encephalopathy-----due to above -CT Head with mild diffuse atrophy,  no acute abnormalities.   --Intermittently confused and disoriented  5)Acute myocardial injury/demand ischemia in the setting of A-fib with RVR -Ischemic demand due to A-fib with RVR -Troponin 39 >> 34, -  EKG is A-fib with RVR, nonspecific changes without overt ischemic changes. -Management of alcohol withdrawal,  A-fib RVR, heart failure, borderline hypotension, aspiration pneumonia per above. -Echo from 12/31/2022 with preserved EF, without wall motion abnormalities -Appreciate cardiology recommendations   6)-acute anemia with heme positive stool--- ??  Upper GI bleed in an alcoholic male--- anemia workup consistent with iron  deficiency, B12 and folate deficiency as well -Appears macrocytic and hyperchromic - Folate 3.6, B12 -158----B12 and folic acid  replacement initiated -- Iron  13, saturation 7% --- iron  supplementation ordered Hgb trended down from 11.5 on admission down to 9.8 (hemoglobin was as low as 8.3 this admission) -Continue IV PPI -GI consult appreciated -Patient is noncompliant with colonoscopy prep (last colonoscopy was in 2016, no prior EGD) --Patient family members are trying to convince him to agree to have EGD and colonoscopy ASAP  7)AKI----acute kidney injury---due to dehydration -  creatinine on admission= 1.31 , - baseline creatinine =1.0     -Creatinine has normalized  -renally adjust medications, avoid nephrotoxic agents / dehydration  / hypotension  8)Social/Ethics--- prior to admission lived alone, admits to heavy alcohol abuse -Patient Sister Justin Fleming is primary contact -Discussed with sisters ---Justin Fleming and Justin Fleming as well as Justin Fleming and Niece-Justin Fleming--   9)Alcohol intoxication/acute metabolic encephalopathy -Reports drinking up to 1.75 L of liquor a day --Patient completed Librium  taper,  , continue folic acid  thiamine  and multivitamin  --intermittently confused, with episodes of disorientation and forgetfulness -Refusing medications----intermittently uncooperative and noncompliant   10)Fatty Liver with mild ascites--- in the setting of chronic alcohol abuse -Right upper quadrant ultrasound from 01/04/2023 noted -Supportive management -Diuretics as above  11)Asymptomatic cholelithiasis----right upper quadrant ultrasound shows.. Contracted  gallbladder with stones and sludge. No ductal dilatation.   12) capacity evaluation----Justin Fleming is  a  63 y.o.  who is AAO x 3 without significant depressive symptoms and without delusion/psychosis who is unable to understand (without significant language barrier) his current medical diagnosis, patient also unable to accurately verbalizes understanding of proposed treatment options including option of no treatment, the consequences/risk versus benefit of each treatment option, alternatives as well as the option of no treatment. -- Based on my evaluation Aimee L Gruenberg  appears to Not have the Capacity to make decisions or give informed consent about his medical care.  A surrogate decision-maker is required as Vicenta L Plog  appears to Not have capacity to make his own decisions and give informed consent regarding his medical care ---Patient remains forgetful and intermittently confused Discussed with sisters ---Justin Fleming and Justin Fleming as well as Justin Fleming and Niece-Justin Fleming--  at bedside--- they have offered to make medical decisions for patient  ----------------------------------------------------------------------------------------------------------------------------------------------- Nutritional status:  The patient's BMI is: Body mass index is 28.06 kg/m. I agree with the assessment and plan as outlined below: Nutrition Status:  DVT prophylaxis:  Heparin  drip  Code Status:   Code Status: Full Code  Family Communication: Discussed with sisters ---Justin Fleming and Justin Fleming as well as Justin Fleming and Niece-Justin Fleming--  Admission status:    Inpatient  Disposition: From  - home             Planning for discharge to SNF rehab Vs Home with [facility] Procedures:   Antimicrobials:  Anti-infectives (From admission, onward)  Start     Dose/Rate Route Frequency Ordered Stop   12/31/22 0000  cefTRIAXone  (ROCEPHIN ) 2 g in sodium chloride  0.9 % 100 mL IVPB        2 g 200 mL/hr over  30 Minutes Intravenous Every 24 hours 12/30/22 0443 01/02/23 0013   12/30/22 2200  azithromycin  (ZITHROMAX ) tablet 500 mg        500 mg Oral Daily at bedtime 12/30/22 0443 12/31/22 2112   12/30/22 0000  cefTRIAXone  (ROCEPHIN ) 2 g in sodium chloride  0.9 % 100 mL IVPB        2 g 200 mL/hr over 30 Minutes Intravenous  Once 12/29/22 2345 12/30/22 0100   12/30/22 0000  azithromycin  (ZITHROMAX ) 500 mg in sodium chloride  0.9 % 250 mL IVPB        500 mg 250 mL/hr over 60 Minutes Intravenous  Once 12/29/22 2345 12/30/22 0158      Medication:   Chlorhexidine  Gluconate Cloth  6 each Topical Q0600   cyanocobalamin   1,000 mcg Intramuscular Once   vitamin B-12  1,000 mcg Oral Daily   digoxin   0.25 mg Oral Daily   [START ON 01/09/2023] ferrous sulfate   325 mg Oral Q breakfast   folic acid   1 mg Oral Daily   furosemide   40 mg Intravenous Once   metoprolol  tartrate  37.5 mg Oral QID   midodrine   10 mg Oral TID WC   multivitamin with minerals  1 tablet Oral Daily   pantoprazole  (PROTONIX ) IV  40 mg Intravenous Q12H   polyethylene glycol  17 g Oral Daily   sodium chloride  flush  10-40 mL Intracatheter Q12H   sodium chloride  flush  3 mL Intravenous Q12H   sucralfate   1 g Oral TID WC & HS   thiamine   100 mg Oral Daily   acetaminophen , mouth rinse, oxyCODONE , sodium chloride  flush  Objective:   Vitals:   01/07/23 0600 01/07/23 0700 01/07/23 0730 01/07/23 0743  BP: (!) 94/57 (!) 88/71 92/65   Pulse: (!) 101 98    Resp: 11 12 11    Temp:    98.2 F (36.8 C)  TempSrc:    Oral  SpO2: 100% 100%    Weight:      Height:        Intake/Output Summary (Last 24 hours) at 01/07/2023 1024 Last data filed at 01/07/2023 0836 Gross per 24 hour  Intake 884.68 ml  Output 3650 ml  Net -2765.32 ml   Filed Weights   01/05/23 0500 01/06/23 0400 01/07/23 0400  Weight: 99.4 kg 94.1 kg 88.7 kg    Physical examination:    Physical Exam Gen:- Awake Alert, intermittently confused and forgetful  HEENT:- [place].AT,  No sclera icterus Neck-Supple Neck,  +ve JVD,.  Lungs-improving air movement,  +ve bibasilar rales  CV- S1, S2 normal, RRR Abd-  +ve B.Sounds, Abd Soft, No tenderness,    Extremity/Skin:-Improving +ve  edema, good pedal pulses  Psych-affect is flat, oriented x3, forgetful with intermittent confusion and disorientation Neuro-generalized weakness, no new focal deficits, mild tremors GU-improving penile and Scrotal Edema MSK--Rt arm PICC Line in situ Wounds: Please see nursing documentation  LABs:     Latest Ref Rng & Units 01/07/2023    4:09 AM 01/06/2023    4:48 AM 01/05/2023    4:32 AM  CBC  WBC 4.0 - 10.5 K/uL 8.2  6.9  6.5   Hemoglobin 13.0 - 17.0 g/dL 9.8  9.2  8.3   Hematocrit 39.0 - 52.0 % 28.9  27.3  25.8   Platelets 150 - 400 K/uL 257  193  138       Latest Ref Rng & Units 01/07/2023    4:09 AM 01/06/2023    4:48 AM 01/04/2023    3:20 AM  CMP  Glucose 70 - 99 mg/dL 886  881  874    874   BUN 8 - 23 mg/dL 11  12  16    16    Creatinine 0.61 - 1.24 mg/dL 9.11  8.90  8.89    8.92   Sodium 135 - 145 mmol/L 129  131  137    138   Potassium 3.5 - 5.1 mmol/L 3.8  3.1  3.9    3.9   Chloride 98 - 111 mmol/L 82  85  99    98   CO2 22 - 32 mmol/L 38  35  31    31   Calcium 8.9 - 10.3 mg/dL 8.7  8.6  8.4    8.5    Micro Results Recent Results (from the past 240 hours)  Culture, blood (routine x 2)     Status: None   Collection Time: 12/30/22 12:51 AM   Specimen: BLOOD  Result Value Ref Range Status   Specimen Description BLOOD BLOOD RIGHT ARM  Final   Special Requests   Final    BOTTLES DRAWN AEROBIC ONLY Blood Culture results may not be optimal due to an inadequate volume of blood received in culture bottles   Culture   Final    NO GROWTH 5 DAYS Performed at [facility], [address], [place], [place] [address]    Report Status 01/04/2023 FINAL  Final  Culture, blood (routine x 2)     Status: None   Collection Time: 12/30/22 12:55 AM   Specimen: BLOOD LEFT FOREARM   Result Value Ref Range Status   Specimen Description BLOOD LEFT FOREARM  Final   Special Requests   Final    BOTTLES DRAWN AEROBIC AND ANAEROBIC Blood Culture results may not be optimal due to an inadequate volume of blood received in culture bottles   Culture   Final    NO GROWTH 5 DAYS Performed at [facility], [address], [place], [place] [address]    Report Status 01/04/2023 FINAL  Final  Respiratory (~20 pathogens) panel by PCR     Status: None   Collection Time: 12/30/22  3:59 AM   Specimen: Nasopharyngeal Swab; Respiratory  Result Value Ref Range Status   Adenovirus NOT DETECTED NOT DETECTED Final   Coronavirus 229E NOT DETECTED NOT DETECTED Final    Comment: (NOTE) The Coronavirus on the Respiratory Panel, DOES NOT test for the novel  Coronavirus (2019 nCoV)    Coronavirus HKU1 NOT DETECTED NOT DETECTED Final   Coronavirus NL63 NOT DETECTED NOT DETECTED Final   Coronavirus OC43 NOT DETECTED NOT DETECTED Final   Metapneumovirus NOT DETECTED NOT DETECTED Final   Rhinovirus / Enterovirus NOT DETECTED NOT DETECTED Final   Influenza A NOT DETECTED NOT DETECTED Final   Influenza B NOT DETECTED NOT DETECTED Final   Parainfluenza Virus 1 NOT DETECTED NOT DETECTED Final   Parainfluenza Virus 2 NOT DETECTED NOT DETECTED Final   Parainfluenza Virus 3 NOT DETECTED NOT DETECTED Final   Parainfluenza Virus 4 NOT DETECTED NOT DETECTED Final   Respiratory Syncytial Virus NOT DETECTED NOT DETECTED Final   Bordetella pertussis NOT DETECTED NOT DETECTED Final   Bordetella Parapertussis NOT DETECTED NOT DETECTED Final  Chlamydophila pneumoniae NOT DETECTED NOT DETECTED Final   Mycoplasma pneumoniae NOT DETECTED NOT DETECTED Final    Comment: Performed at [facility], [address], [place], [place] [address]  MRSA Next Gen by PCR, Nasal     Status: None   Collection Time: 12/30/22  9:06 AM   Specimen: Nasal Mucosa; Nasal Swab  Result Value Ref Range Status   MRSA by PCR  Next Gen NOT DETECTED NOT DETECTED Final    Comment: (NOTE) The GeneXpert MRSA Assay (FDA approved for NASAL specimens only), is one component of a comprehensive MRSA colonization surveillance program. It is not intended to diagnose MRSA infection nor to guide or monitor treatment for MRSA infections. Test performance is not FDA approved in patients less than 84 years old. Performed at [facility], [address] [place], [place] [address]   SARS Coronavirus 2 by RT PCR (hospital order, performed in [facility] hospital lab) *cepheid single result test* Anterior Nasal Swab     Status: None   Collection Time: 12/31/22 12:31 PM   Specimen: Anterior Nasal Swab  Result Value Ref Range Status   SARS Coronavirus 2 by RT PCR NEGATIVE NEGATIVE Final    Comment: (NOTE) SARS-CoV-2 target nucleic acids are NOT DETECTED.  The SARS-CoV-2 RNA is generally detectable in upper and lower respiratory specimens during the acute phase of infection. The lowest concentration of SARS-CoV-2 viral copies this assay can detect is 250 copies / mL. A negative result does not preclude SARS-CoV-2 infection and should not be used as the sole basis for treatment or other patient management decisions.  A negative result may occur with improper specimen collection / handling, submission of specimen other than nasopharyngeal swab, presence of viral mutation(s) within the areas targeted by this assay, and inadequate number of viral copies (<250 copies / mL). A negative result must be combined with clinical observations, patient history, and epidemiological information.  Fact Sheet for Patients:   roadlaptop.co.za  Fact Sheet for Healthcare Providers: http://kim-miller.com/  This test is not yet approved or  cleared by the [place]  FDA and has been authorized for detection and/or diagnosis of SARS-CoV-2 by FDA under an Emergency Use Authorization (EUA).  This EUA  will remain in effect (meaning this test can be used) for the duration of the COVID-19 declaration under Section 564(b)(1) of the Act, 21 U.S.C. section 360bbb-3(b)(1), unless the authorization is terminated or revoked sooner.  Performed at [facility], [address], [place], [place] [address]   Culture, blood (Routine X 2) w Reflex to ID Panel     Status: None (Preliminary result)   Collection Time: 01/04/23  4:19 PM   Specimen: BLOOD LEFT HAND  Result Value Ref Range Status   Specimen Description BLOOD LEFT HAND  Final   Special Requests   Final    BOTTLES DRAWN AEROBIC AND ANAEROBIC Blood Culture adequate volume   Culture   Final    NO GROWTH 3 DAYS Performed at [facility], [address], [place], [place] [address]    Report Status PENDING  Incomplete  Culture, blood (Routine X 2) w Reflex to ID Panel     Status: None (Preliminary result)   Collection Time: 01/04/23  4:19 PM   Specimen: BLOOD LEFT WRIST  Result Value Ref Range Status   Specimen Description BLOOD LEFT WRIST  Final   Special Requests   Final    BOTTLES DRAWN AEROBIC AND ANAEROBIC Blood Culture adequate volume   Culture  Final    NO GROWTH 3 DAYS Performed at [facility], [address], [place], [place] [address]    Report Status PENDING  Incomplete   SIGNED: Rendall Carwin, MD,  - [facility],  Pager (please use [email or web address] to page/ text) Please use Epic Secure Chat for non-urgent communication (7AM-7PM)  If 7PM-7AM, please contact night-coverage [email or web address], 01/07/2023, 10:24 AM

## 2023-01-07 NOTE — Progress Notes (Signed)
 Gastroenterology Progress Note   Referring Provider: No ref. provider found Primary Care Physician:  Patient, No Pcp Per Primary Gastroenterologist:  Dr. Cinderella  Patient ID: Justin Fleming; [id number]; Mar 22, 1960   Subjective:    Patient alert and oriented to person/place/time. Per nursing staff who took care of him yesterday/today, he is somewhat slow to respond but has been appropriate. Family reported he is at baseline per nursing. Patient refused to take bowel prep. He states that he does not want bowel prep right now but would consider in the future. He is hungry and wants to eat. His EGD and colonoscopy was cancelled today as patient did not take bowel prep and his BPs have been soft.   Objective:   Vital signs in last 24 hours: Temp:  [97.9 F (36.6 C)-99.3 F (37.4 C)] 98.2 F (36.8 C) (01/02 0743) Pulse Rate:  [43-129] 98 (01/02 0700) Resp:  [5-27] 11 (01/02 0730) BP: (78-134)/(45-94) 92/65 (01/02 0730) SpO2:  [87 %-100 %] 100 % (01/02 0700) Weight:  [88.7 kg] 88.7 kg (01/02 0400) Last BM Date : 01/06/23 General:   Alert,  somewhat slow to respond but he is appropriate.Well-developed, well-nourished, pleasant and cooperative in NAD Head:  Normocephalic and atraumatic. Eyes:  Sclera clear, no icterus.  Chest: CTA bilaterally without rales, rhonchi, crackles.    Heart:  Regular rate and rhythm; no murmurs, clicks, rubs,  or gallops. Abdomen:  Soft, nontender and nondistended.  Normal bowel sounds, without guarding, and without rebound.   Extremities:  Without clubbing, deformity or edema. Neurologic:  Alert and  oriented x4;  grossly normal neurologically. Skin:  Intact without significant lesions or rashes. Psych:  Alert and cooperative. Normal mood and affect.  Intake/Output from previous day: 01/01 0701 - 01/02 0700 In: 1124.7 [P.O.:480; I.V.:644.7] Out: 4850 [Urine:4850] Intake/Output this shift: Total I/O In: -  Out: 650 [Urine:650]  Lab  Results: CBC Recent Labs    01/05/23 0432 01/06/23 0448 01/07/23 0409  WBC 6.5 6.9 8.2  HGB 8.3* 9.2* 9.8*  HCT 25.8* 27.3* 28.9*  MCV 108.9* 104.2* 102.8*  PLT 138* 193 257   BMET Recent Labs    01/06/23 0448 01/07/23 0409  NA 131* 129*  K 3.1* 3.8  CL 85* 82*  CO2 35* 38*  GLUCOSE 118* 113*  BUN 12 11  CREATININE 1.09 0.88  CALCIUM 8.6* 8.7*   LFTs Recent Labs    01/06/23 0448 01/07/23 0409  ALBUMIN  2.4* 2.4*   No results for input(s): LIPASE in the last 72 hours. PT/INR No results for input(s): LABPROT, INR in the last 72 hours.     Lab Results  Component Value Date   IRON  13 (L) 01/02/2023   TIBC 191 (L) 01/02/2023   FERRITIN 380 (H) 01/02/2023   Lab Results  Component Value Date   VITAMINB12 158 (L) 12/30/2022   Lab Results  Component Value Date   FOLATE 3.6 (L) 12/30/2022     Imaging Studies: DG CHEST PORT 1 VIEW Result Date: 01/05/2023 CLINICAL DATA:  Dyspnea. EXAM: PORTABLE CHEST 1 VIEW COMPARISON:  January 03, 2023. FINDINGS: Stable cardiomegaly. Mild central pulmonary vascular congestion is noted. Hypoinflation of the lungs is noted with bibasilar atelectasis or edema with associated effusions. Bony thorax is unremarkable. Right-sided PICC line is unchanged. IMPRESSION: Stable cardiomegaly with mild central pulmonary vascular congestion. Hypoinflation of the lungs is noted with bibasilar atelectasis or edema with associated effusions. Electronically Signed   By: Lynwood Landy Raddle M.D.   On:  01/05/2023 10:50   US  Abdomen Limited RUQ (LIVER/GB) Result Date: 01/04/2023 CLINICAL DATA:  Liver disease. EXAM: ULTRASOUND ABDOMEN LIMITED RIGHT UPPER QUADRANT COMPARISON:  Ultrasound 2016 FINDINGS: Gallbladder: Gallbladder is underdistended. Sludge and stones suggested. The wall thickening of the gallbladder could relate to the patient's history of chronic liver disease in the contracted state. Evaluation for Murphy's sign is limited as the patient  could not responding to commands. Common bile duct: Diameter: 4 mm Liver: Echogenic hepatic parenchyma consistent with fatty liver infiltration. Please correlate for known history of chronic liver disease. If there is concern further of underlying mass lesion additional workup with a contrast study is recommended. Portal vein is patent on color Doppler imaging with normal direction of blood flow towards the liver. Other: Mild ascites. IMPRESSION: Echogenic liver.  Mild ascites. Contracted gallbladder with stones and sludge. No ductal dilatation. Electronically Signed   By: Ranell Bring M.D.   On: 01/04/2023 15:38   DG CHEST PORT 1 VIEW Result Date: 01/03/2023 CLINICAL DATA:  [id number] Dyspnea and respiratory abnormalities [id number] EXAM: PORTABLE CHEST 1 VIEW COMPARISON:  12/30/2022. FINDINGS: Low lung volume. Mild-to-moderate diffuse pulmonary vascular congestion without significant interval change. There are bilateral small layering pleural effusions with probable associated compressive atelectatic changes. No pneumothorax. Stable cardio-mediastinal silhouette. No acute osseous abnormalities. The soft tissues are within normal limits. Right-sided PICC line is seen with its tip overlying the cavoatrial junction region. IMPRESSION: *Findings favor congestive heart failure/pulmonary edema, without significant interval change. Electronically Signed   By: Ree Molt M.D.   On: 01/03/2023 08:54   CT Angio Chest Pulmonary Embolism (PE) W or WO Contrast Result Date: 12/31/2022 CLINICAL DATA:  Elevated D-dimer and peripheral swelling EXAM: CT ANGIOGRAPHY CHEST WITH CONTRAST TECHNIQUE: Multidetector CT imaging of the chest was performed using the standard protocol during bolus administration of intravenous contrast. Multiplanar CT image reconstructions and MIPs were obtained to evaluate the vascular anatomy. RADIATION DOSE REDUCTION: This exam was performed according to the departmental dose-optimization program which  includes automated exposure control, adjustment of the mA and/or kV according to patient size and/or use of iterative reconstruction technique. CONTRAST:  OMNIPAQUE  IOHEXOL  350 MG/ML SOLN COMPARISON:  Chest x-ray from the previous day. FINDINGS: Cardiovascular: Thoracic aorta shows a normal branching pattern. No aneurysmal dilatation or dissection is noted. Heart is at the upper limits of normal in size. The pulmonary artery shows a normal branching pattern bilaterally. No filling defect to suggest pulmonary embolism is noted. Coronary calcifications are seen. Right PICC is noted. Mediastinum/Nodes: Thoracic inlet is within normal limits. No hilar or mediastinal adenopathy is noted. The esophagus as visualized is within normal limits. Lungs/Pleura: Small left-sided pleural effusion is noted. Tiny right-sided pleural effusion is noted with the predominant lateral component. Bilateral lower lobe atelectasis is seen. No focal confluent infiltrate is noted. No parenchymal nodules are noted. Upper Abdomen: No acute abnormality. Musculoskeletal: Degenerative changes of the thoracic spine are noted. Review of the MIP images confirms the above findings. IMPRESSION: Small pleural effusions, left greater than right with lower lobe atelectasis. No evidence of pulmonary emboli. Electronically Signed   By: Oneil Devonshire M.D.   On: 12/31/2022 18:53   US  Venous Img Lower Bilateral (DVT) Result Date: 12/31/2022 CLINICAL DATA:  Lower extremity swelling EXAM: BILATERAL LOWER EXTREMITY VENOUS DOPPLER ULTRASOUND TECHNIQUE: Gray-scale sonography with graded compression, as well as color Doppler and duplex ultrasound were performed to evaluate the lower extremity deep venous systems from the level of the common femoral vein  and including the common femoral, femoral, profunda femoral, popliteal and calf veins including the posterior tibial, peroneal and gastrocnemius veins when visible. The superficial great saphenous vein was  also interrogated. Spectral Doppler was utilized to evaluate flow at rest and with distal augmentation maneuvers in the common femoral, femoral and popliteal veins. COMPARISON:  None Available. FINDINGS: RIGHT LOWER EXTREMITY Common Femoral Vein: No evidence of thrombus. Normal compressibility, respiratory phasicity and response to augmentation. Saphenofemoral Junction: No evidence of thrombus. Normal compressibility and flow on color Doppler imaging. Profunda Femoral Vein: No evidence of thrombus. Normal compressibility and flow on color Doppler imaging. Femoral Vein: No evidence of thrombus. Normal compressibility, respiratory phasicity and response to augmentation. Popliteal Vein: No evidence of thrombus. Normal compressibility, respiratory phasicity and response to augmentation. Calf Veins: No evidence of thrombus. Normal compressibility and flow on color Doppler imaging. Superficial Great Saphenous Vein: No evidence of thrombus. Normal compressibility and flow on color Doppler imaging. Venous Reflux:  None. Other Findings:  Lower extremity edema is noted. LEFT LOWER EXTREMITY Common Femoral Vein: No evidence of thrombus. Normal compressibility, respiratory phasicity and response to augmentation. Saphenofemoral Junction: No evidence of thrombus. Normal compressibility and flow on color Doppler imaging. Profunda Femoral Vein: No evidence of thrombus. Normal compressibility and flow on color Doppler imaging. Femoral Vein: No evidence of thrombus. Normal compressibility, respiratory phasicity and response to augmentation. Popliteal Vein: No evidence of thrombus. Normal compressibility, respiratory phasicity and response to augmentation. Calf Veins: No evidence of thrombus. Normal compressibility and flow on color Doppler imaging. Superficial Great Saphenous Vein: No evidence of thrombus. Normal compressibility and flow on color Doppler imaging. Venous Reflux:  None. Other Findings:  Lower extremity edema is noted.  IMPRESSION: No evidence of deep venous thrombosis. Electronically Signed   By: Oneil Devonshire M.D.   On: 12/31/2022 18:49   ECHOCARDIOGRAM COMPLETE Result Date: 12/31/2022    ECHOCARDIOGRAM REPORT   Patient Name:   RAYANSH HERBST Date of Exam: 12/30/2022 Medical Rec #:  [id number]        Height:       70.0 in Accession #:    [id number]       Weight:       218.0 lb Date of Birth:  1961-01-01        BSA:          2.165 m Patient Age:    62 years         BP:           119/81 mmHg Patient Gender: M                HR:           94 bpm. Exam Location:  [facility] Procedure: 2D Echo, Cardiac Doppler, Color Doppler and Intracardiac            Opacification Agent Indications:    Congestive Heart Failure I50.9  History:        Patient has no prior history of Echocardiogram examinations.                 Risk Factors:Hypertension and Current Smoker.  Sonographer:    Aida Pizza RCS Referring Phys: [id number] [facility]  Sonographer Comments: Technically difficult study due to poor echo windows. IMPRESSIONS  1. Left ventricular ejection fraction, by estimation, is 55 to 60%. The left ventricle has normal function. The left ventricle has no regional wall motion abnormalities. There is mild concentric left ventricular hypertrophy. Left ventricular diastolic parameters  are indeterminate.  2. Right ventricular systolic function was not well visualized. The right ventricular size is not well visualized. Tricuspid regurgitation signal is inadequate for assessing PA pressure.  3. Left atrial size was severely dilated.  4. Right atrial size was moderately dilated.  5. The mitral valve is grossly normal. Trivial mitral valve regurgitation.  6. The aortic valve was not well visualized. Aortic valve regurgitation is not visualized.  7. The inferior vena cava is dilated in size with <50% respiratory variability, suggesting right atrial pressure of 15 mmHg. Comparison(s): No prior Echocardiogram. FINDINGS  Left Ventricle: Left  ventricular ejection fraction, by estimation, is 55 to 60%. The left ventricle has normal function. The left ventricle has no regional wall motion abnormalities. Definity  contrast agent was given IV to delineate the left ventricular  endocardial borders. The left ventricular internal cavity size was normal in size. There is mild concentric left ventricular hypertrophy. Left ventricular diastolic function could not be evaluated due to atrial fibrillation. Left ventricular diastolic parameters are indeterminate. Right Ventricle: The right ventricular size is not well visualized. Right vetricular wall thickness was not well visualized. Right ventricular systolic function was not well visualized. Tricuspid regurgitation signal is inadequate for assessing PA pressure. Left Atrium: Left atrial size was severely dilated. Right Atrium: Right atrial size was moderately dilated. Pericardium: There is no evidence of pericardial effusion. Mitral Valve: The mitral valve is grossly normal. Trivial mitral valve regurgitation. Tricuspid Valve: The tricuspid valve is grossly normal. Tricuspid valve regurgitation is trivial. Aortic Valve: The aortic valve was not well visualized. Aortic valve regurgitation is not visualized. Pulmonic Valve: The pulmonic valve was not well visualized. Pulmonic valve regurgitation is not visualized. Aorta: The aortic root is normal in size and structure. Venous: The inferior vena cava is dilated in size with less than 50% respiratory variability, suggesting right atrial pressure of 15 mmHg. IAS/Shunts: No atrial level shunt detected by color flow Doppler.  LEFT VENTRICLE PLAX 2D LVIDd:         3.40 cm LVIDs:         2.40 cm LV PW:         1.10 cm LV IVS:        1.10 cm LVOT diam:     1.80 cm LV SV:         44 LV SV Index:   20 LVOT Area:     2.54 cm  RIGHT VENTRICLE TAPSE (M-mode): 1.2 cm LEFT ATRIUM              Index        RIGHT ATRIUM           Index LA diam:        2.80 cm  1.29 cm/m   RA Area:      26.20 cm LA Vol (A2C):   115.0 ml 53.11 ml/m  RA Volume:   90.00 ml  41.56 ml/m LA Vol (A4C):   112.0 ml 51.72 ml/m LA Biplane Vol: 119.0 ml 54.96 ml/m  AORTIC VALVE LVOT Vmax:   97.50 cm/s LVOT Vmean:  72.800 cm/s LVOT VTI:    0.173 m  AORTA Ao Root diam: 3.10 cm MITRAL VALVE MV Area (PHT): 4.21 cm     SHUNTS MV Decel Time: 180 msec     Systemic VTI:  0.17 m MV E velocity: 127.00 cm/s  Systemic Diam: 1.80 cm Jayson Sierras MD Electronically signed by Jayson Sierras MD Signature Date/Time: 12/31/2022/1:36:31 PM    Final  DG CHEST PORT 1 VIEW Result Date: 12/30/2022 CLINICAL DATA:  PICC line placement. EXAM: PORTABLE CHEST 1 VIEW COMPARISON:  One-view chest x-ray 12/29/2022 FINDINGS: Heart is enlarged. Right pleural effusion remains. Mild pulmonary vascular congestion is present bilaterally. A new right-sided PICC line is in place. The tip is at the cavoatrial junction. No pneumothorax is present. The visualized soft tissues and bony thorax are otherwise unremarkable. IMPRESSION: 1. New right-sided PICC line with tip at the cavoatrial junction. 2. Cardiomegaly and mild pulmonary vascular congestion. 3. Persistent right pleural effusion. Electronically Signed   By: Lonni Necessary M.D.   On: 12/30/2022 16:25   US  EKG SITE RITE Result Date: 12/30/2022 If Site Rite image not attached, placement could not be confirmed due to current cardiac rhythm.  DG Chest Port 1 View Result Date: 12/29/2022 CLINICAL DATA:  Altered mental status. EXAM: PORTABLE CHEST 1 VIEW COMPARISON:  11/05/2014 FINDINGS: Extremely shallow inspiration. Heart size and pulmonary vascularity are probably normal for technique. Suggestion of a right pleural effusion with basilar atelectasis or infiltration. No visible pneumothorax. IMPRESSION: Shallow inspiration. Right pleural effusion with basilar atelectasis or infiltration. Electronically Signed   By: Elsie Gravely M.D.   On: 12/29/2022 23:33   CT Head Wo  Contrast Result Date: 12/29/2022 CLINICAL DATA:  Mental status change, unknown cause EXAM: CT HEAD WITHOUT CONTRAST TECHNIQUE: Contiguous axial images were obtained from the base of the skull through the vertex without intravenous contrast. RADIATION DOSE REDUCTION: This exam was performed according to the departmental dose-optimization program which includes automated exposure control, adjustment of the mA and/or kV according to patient size and/or use of iterative reconstruction technique. COMPARISON:  None Available. FINDINGS: Brain: Mild diffuse cerebral atrophy. No acute intracranial abnormality. Specifically, no hemorrhage, hydrocephalus, mass lesion, acute infarction, or significant intracranial injury. Vascular: No hyperdense vessel or unexpected calcification. Skull: No acute calvarial abnormality. Sinuses/Orbits: No acute findings Other: None IMPRESSION: No acute intracranial abnormality. Mild atrophy. Electronically Signed   By: Franky Crease M.D.   On: 12/29/2022 23:28  [2 weeks]  Assessment:   Justin Fleming is a 63 y.o. year old male with history of significant alcohol abuse, anxiety/depression, tobacco abuse, GERD, hepatic steatosis, and HTN who presented to the ED with AMS and found to be in Afib and has had ongoing anemia and stool heme positive.    Anemia, heme positive stool: Suspect potential upper GI source given reports of melena and alcohol abuse. Suspect alcoholic gastritis/esophagitis/duodenitis, but differentials include peptic ulcer disease, malignancy, AVMs, etc. Iron  panel with elevated iron  and iron  duration levels initially, 184 and 84% respectively.  On repeat serum iron  13, TIBC 191, iron  sat 7%.. Folate low, 3.6, B12 158.   Discussed the need to perform both EGD and colonoscopy to find potential source of anemia, heme + stool given he will need anticoagulation for his Afib. He was cleared from cardiac standpoint with plans for procedures today as part of clearing him  to discharge on oral anticoagulation. Yesterday, attending deemed patient lacking capacity to make decisions regarding recommended procedures. Family was agreeable to consent patient for procedures. Patient did not take bowel prep. He has been noted to have intermittent confusion. It has been felt that he does not have capacity to fully understand consequences/risk vs benefit of each treatment option or option of not treating. Today he seems more appropriate and adamantly declines EGD or colonoscopy for now. Dr. Cinderella advising holding off on procedures in light of patient not prepping and goal of  sedation once to complete EGD/colonoscopy. Patient still at risk due to soft BPs.  Drop in platelets during initial part of hospitalization with no chronic findings of liver disease mentioned on chest imaging or throughout the chart. Platelets are now normal.   B12 deficiency: Parietal cell antibody IgG negative. Intrinsic factor Ab negative.    Plan:   Continue PPI. Recommend etoh cessation. EGD/colonoscopy when patient agreeable, likely as outpatient.  Replete folate and B12.  Avoid NSAIDS. Ok to resume heparain. Ok for full liquids.    LOS: 8 days   Sonny RAMAN. Ezzard RIGGERS [facility] [phone] 1/2/202510:15 AM

## 2023-01-07 NOTE — H&P (View-Only) (Signed)
 Gastroenterology Progress Note   Referring Provider: No ref. provider found Primary Care Physician:  Patient, No Pcp Per Primary Gastroenterologist:  Dr. Cinderella  Patient ID: Justin Fleming; [id number]; Mar 22, 1960   Subjective:    Patient alert and oriented to person/place/time. Per nursing staff who took care of him yesterday/today, he is somewhat slow to respond but has been appropriate. Family reported he is at baseline per nursing. Patient refused to take bowel prep. He states that he does not want bowel prep right now but would consider in the future. He is hungry and wants to eat. His EGD and colonoscopy was cancelled today as patient did not take bowel prep and his BPs have been soft.   Objective:   Vital signs in last 24 hours: Temp:  [97.9 F (36.6 C)-99.3 F (37.4 C)] 98.2 F (36.8 C) (01/02 0743) Pulse Rate:  [43-129] 98 (01/02 0700) Resp:  [5-27] 11 (01/02 0730) BP: (78-134)/(45-94) 92/65 (01/02 0730) SpO2:  [87 %-100 %] 100 % (01/02 0700) Weight:  [88.7 kg] 88.7 kg (01/02 0400) Last BM Date : 01/06/23 General:   Alert,  somewhat slow to respond but he is appropriate.Well-developed, well-nourished, pleasant and cooperative in NAD Head:  Normocephalic and atraumatic. Eyes:  Sclera clear, no icterus.  Chest: CTA bilaterally without rales, rhonchi, crackles.    Heart:  Regular rate and rhythm; no murmurs, clicks, rubs,  or gallops. Abdomen:  Soft, nontender and nondistended.  Normal bowel sounds, without guarding, and without rebound.   Extremities:  Without clubbing, deformity or edema. Neurologic:  Alert and  oriented x4;  grossly normal neurologically. Skin:  Intact without significant lesions or rashes. Psych:  Alert and cooperative. Normal mood and affect.  Intake/Output from previous day: 01/01 0701 - 01/02 0700 In: 1124.7 [P.O.:480; I.V.:644.7] Out: 4850 [Urine:4850] Intake/Output this shift: Total I/O In: -  Out: 650 [Urine:650]  Lab  Results: CBC Recent Labs    01/05/23 0432 01/06/23 0448 01/07/23 0409  WBC 6.5 6.9 8.2  HGB 8.3* 9.2* 9.8*  HCT 25.8* 27.3* 28.9*  MCV 108.9* 104.2* 102.8*  PLT 138* 193 257   BMET Recent Labs    01/06/23 0448 01/07/23 0409  NA 131* 129*  K 3.1* 3.8  CL 85* 82*  CO2 35* 38*  GLUCOSE 118* 113*  BUN 12 11  CREATININE 1.09 0.88  CALCIUM 8.6* 8.7*   LFTs Recent Labs    01/06/23 0448 01/07/23 0409  ALBUMIN  2.4* 2.4*   No results for input(s): LIPASE in the last 72 hours. PT/INR No results for input(s): LABPROT, INR in the last 72 hours.     Lab Results  Component Value Date   IRON  13 (L) 01/02/2023   TIBC 191 (L) 01/02/2023   FERRITIN 380 (H) 01/02/2023   Lab Results  Component Value Date   VITAMINB12 158 (L) 12/30/2022   Lab Results  Component Value Date   FOLATE 3.6 (L) 12/30/2022     Imaging Studies: DG CHEST PORT 1 VIEW Result Date: 01/05/2023 CLINICAL DATA:  Dyspnea. EXAM: PORTABLE CHEST 1 VIEW COMPARISON:  January 03, 2023. FINDINGS: Stable cardiomegaly. Mild central pulmonary vascular congestion is noted. Hypoinflation of the lungs is noted with bibasilar atelectasis or edema with associated effusions. Bony thorax is unremarkable. Right-sided PICC line is unchanged. IMPRESSION: Stable cardiomegaly with mild central pulmonary vascular congestion. Hypoinflation of the lungs is noted with bibasilar atelectasis or edema with associated effusions. Electronically Signed   By: Lynwood Landy Raddle M.D.   On:  01/05/2023 10:50   US  Abdomen Limited RUQ (LIVER/GB) Result Date: 01/04/2023 CLINICAL DATA:  Liver disease. EXAM: ULTRASOUND ABDOMEN LIMITED RIGHT UPPER QUADRANT COMPARISON:  Ultrasound 2016 FINDINGS: Gallbladder: Gallbladder is underdistended. Sludge and stones suggested. The wall thickening of the gallbladder could relate to the patient's history of chronic liver disease in the contracted state. Evaluation for Murphy's sign is limited as the patient  could not responding to commands. Common bile duct: Diameter: 4 mm Liver: Echogenic hepatic parenchyma consistent with fatty liver infiltration. Please correlate for known history of chronic liver disease. If there is concern further of underlying mass lesion additional workup with a contrast study is recommended. Portal vein is patent on color Doppler imaging with normal direction of blood flow towards the liver. Other: Mild ascites. IMPRESSION: Echogenic liver.  Mild ascites. Contracted gallbladder with stones and sludge. No ductal dilatation. Electronically Signed   By: Ranell Bring M.D.   On: 01/04/2023 15:38   DG CHEST PORT 1 VIEW Result Date: 01/03/2023 CLINICAL DATA:  [id number] Dyspnea and respiratory abnormalities [id number] EXAM: PORTABLE CHEST 1 VIEW COMPARISON:  12/30/2022. FINDINGS: Low lung volume. Mild-to-moderate diffuse pulmonary vascular congestion without significant interval change. There are bilateral small layering pleural effusions with probable associated compressive atelectatic changes. No pneumothorax. Stable cardio-mediastinal silhouette. No acute osseous abnormalities. The soft tissues are within normal limits. Right-sided PICC line is seen with its tip overlying the cavoatrial junction region. IMPRESSION: *Findings favor congestive heart failure/pulmonary edema, without significant interval change. Electronically Signed   By: Ree Molt M.D.   On: 01/03/2023 08:54   CT Angio Chest Pulmonary Embolism (PE) W or WO Contrast Result Date: 12/31/2022 CLINICAL DATA:  Elevated D-dimer and peripheral swelling EXAM: CT ANGIOGRAPHY CHEST WITH CONTRAST TECHNIQUE: Multidetector CT imaging of the chest was performed using the standard protocol during bolus administration of intravenous contrast. Multiplanar CT image reconstructions and MIPs were obtained to evaluate the vascular anatomy. RADIATION DOSE REDUCTION: This exam was performed according to the departmental dose-optimization program which  includes automated exposure control, adjustment of the mA and/or kV according to patient size and/or use of iterative reconstruction technique. CONTRAST:  OMNIPAQUE  IOHEXOL  350 MG/ML SOLN COMPARISON:  Chest x-ray from the previous day. FINDINGS: Cardiovascular: Thoracic aorta shows a normal branching pattern. No aneurysmal dilatation or dissection is noted. Heart is at the upper limits of normal in size. The pulmonary artery shows a normal branching pattern bilaterally. No filling defect to suggest pulmonary embolism is noted. Coronary calcifications are seen. Right PICC is noted. Mediastinum/Nodes: Thoracic inlet is within normal limits. No hilar or mediastinal adenopathy is noted. The esophagus as visualized is within normal limits. Lungs/Pleura: Small left-sided pleural effusion is noted. Tiny right-sided pleural effusion is noted with the predominant lateral component. Bilateral lower lobe atelectasis is seen. No focal confluent infiltrate is noted. No parenchymal nodules are noted. Upper Abdomen: No acute abnormality. Musculoskeletal: Degenerative changes of the thoracic spine are noted. Review of the MIP images confirms the above findings. IMPRESSION: Small pleural effusions, left greater than right with lower lobe atelectasis. No evidence of pulmonary emboli. Electronically Signed   By: Oneil Devonshire M.D.   On: 12/31/2022 18:53   US  Venous Img Lower Bilateral (DVT) Result Date: 12/31/2022 CLINICAL DATA:  Lower extremity swelling EXAM: BILATERAL LOWER EXTREMITY VENOUS DOPPLER ULTRASOUND TECHNIQUE: Gray-scale sonography with graded compression, as well as color Doppler and duplex ultrasound were performed to evaluate the lower extremity deep venous systems from the level of the common femoral vein  and including the common femoral, femoral, profunda femoral, popliteal and calf veins including the posterior tibial, peroneal and gastrocnemius veins when visible. The superficial great saphenous vein was  also interrogated. Spectral Doppler was utilized to evaluate flow at rest and with distal augmentation maneuvers in the common femoral, femoral and popliteal veins. COMPARISON:  None Available. FINDINGS: RIGHT LOWER EXTREMITY Common Femoral Vein: No evidence of thrombus. Normal compressibility, respiratory phasicity and response to augmentation. Saphenofemoral Junction: No evidence of thrombus. Normal compressibility and flow on color Doppler imaging. Profunda Femoral Vein: No evidence of thrombus. Normal compressibility and flow on color Doppler imaging. Femoral Vein: No evidence of thrombus. Normal compressibility, respiratory phasicity and response to augmentation. Popliteal Vein: No evidence of thrombus. Normal compressibility, respiratory phasicity and response to augmentation. Calf Veins: No evidence of thrombus. Normal compressibility and flow on color Doppler imaging. Superficial Great Saphenous Vein: No evidence of thrombus. Normal compressibility and flow on color Doppler imaging. Venous Reflux:  None. Other Findings:  Lower extremity edema is noted. LEFT LOWER EXTREMITY Common Femoral Vein: No evidence of thrombus. Normal compressibility, respiratory phasicity and response to augmentation. Saphenofemoral Junction: No evidence of thrombus. Normal compressibility and flow on color Doppler imaging. Profunda Femoral Vein: No evidence of thrombus. Normal compressibility and flow on color Doppler imaging. Femoral Vein: No evidence of thrombus. Normal compressibility, respiratory phasicity and response to augmentation. Popliteal Vein: No evidence of thrombus. Normal compressibility, respiratory phasicity and response to augmentation. Calf Veins: No evidence of thrombus. Normal compressibility and flow on color Doppler imaging. Superficial Great Saphenous Vein: No evidence of thrombus. Normal compressibility and flow on color Doppler imaging. Venous Reflux:  None. Other Findings:  Lower extremity edema is noted.  IMPRESSION: No evidence of deep venous thrombosis. Electronically Signed   By: Oneil Devonshire M.D.   On: 12/31/2022 18:49   ECHOCARDIOGRAM COMPLETE Result Date: 12/31/2022    ECHOCARDIOGRAM REPORT   Patient Name:   RAYANSH HERBST Date of Exam: 12/30/2022 Medical Rec #:  [id number]        Height:       70.0 in Accession #:    [id number]       Weight:       218.0 lb Date of Birth:  1961-01-01        BSA:          2.165 m Patient Age:    62 years         BP:           119/81 mmHg Patient Gender: M                HR:           94 bpm. Exam Location:  [facility] Procedure: 2D Echo, Cardiac Doppler, Color Doppler and Intracardiac            Opacification Agent Indications:    Congestive Heart Failure I50.9  History:        Patient has no prior history of Echocardiogram examinations.                 Risk Factors:Hypertension and Current Smoker.  Sonographer:    Aida Pizza RCS Referring Phys: [id number] [facility]  Sonographer Comments: Technically difficult study due to poor echo windows. IMPRESSIONS  1. Left ventricular ejection fraction, by estimation, is 55 to 60%. The left ventricle has normal function. The left ventricle has no regional wall motion abnormalities. There is mild concentric left ventricular hypertrophy. Left ventricular diastolic parameters  are indeterminate.  2. Right ventricular systolic function was not well visualized. The right ventricular size is not well visualized. Tricuspid regurgitation signal is inadequate for assessing PA pressure.  3. Left atrial size was severely dilated.  4. Right atrial size was moderately dilated.  5. The mitral valve is grossly normal. Trivial mitral valve regurgitation.  6. The aortic valve was not well visualized. Aortic valve regurgitation is not visualized.  7. The inferior vena cava is dilated in size with <50% respiratory variability, suggesting right atrial pressure of 15 mmHg. Comparison(s): No prior Echocardiogram. FINDINGS  Left Ventricle: Left  ventricular ejection fraction, by estimation, is 55 to 60%. The left ventricle has normal function. The left ventricle has no regional wall motion abnormalities. Definity  contrast agent was given IV to delineate the left ventricular  endocardial borders. The left ventricular internal cavity size was normal in size. There is mild concentric left ventricular hypertrophy. Left ventricular diastolic function could not be evaluated due to atrial fibrillation. Left ventricular diastolic parameters are indeterminate. Right Ventricle: The right ventricular size is not well visualized. Right vetricular wall thickness was not well visualized. Right ventricular systolic function was not well visualized. Tricuspid regurgitation signal is inadequate for assessing PA pressure. Left Atrium: Left atrial size was severely dilated. Right Atrium: Right atrial size was moderately dilated. Pericardium: There is no evidence of pericardial effusion. Mitral Valve: The mitral valve is grossly normal. Trivial mitral valve regurgitation. Tricuspid Valve: The tricuspid valve is grossly normal. Tricuspid valve regurgitation is trivial. Aortic Valve: The aortic valve was not well visualized. Aortic valve regurgitation is not visualized. Pulmonic Valve: The pulmonic valve was not well visualized. Pulmonic valve regurgitation is not visualized. Aorta: The aortic root is normal in size and structure. Venous: The inferior vena cava is dilated in size with less than 50% respiratory variability, suggesting right atrial pressure of 15 mmHg. IAS/Shunts: No atrial level shunt detected by color flow Doppler.  LEFT VENTRICLE PLAX 2D LVIDd:         3.40 cm LVIDs:         2.40 cm LV PW:         1.10 cm LV IVS:        1.10 cm LVOT diam:     1.80 cm LV SV:         44 LV SV Index:   20 LVOT Area:     2.54 cm  RIGHT VENTRICLE TAPSE (M-mode): 1.2 cm LEFT ATRIUM              Index        RIGHT ATRIUM           Index LA diam:        2.80 cm  1.29 cm/m   RA Area:      26.20 cm LA Vol (A2C):   115.0 ml 53.11 ml/m  RA Volume:   90.00 ml  41.56 ml/m LA Vol (A4C):   112.0 ml 51.72 ml/m LA Biplane Vol: 119.0 ml 54.96 ml/m  AORTIC VALVE LVOT Vmax:   97.50 cm/s LVOT Vmean:  72.800 cm/s LVOT VTI:    0.173 m  AORTA Ao Root diam: 3.10 cm MITRAL VALVE MV Area (PHT): 4.21 cm     SHUNTS MV Decel Time: 180 msec     Systemic VTI:  0.17 m MV E velocity: 127.00 cm/s  Systemic Diam: 1.80 cm Jayson Sierras MD Electronically signed by Jayson Sierras MD Signature Date/Time: 12/31/2022/1:36:31 PM    Final  DG CHEST PORT 1 VIEW Result Date: 12/30/2022 CLINICAL DATA:  PICC line placement. EXAM: PORTABLE CHEST 1 VIEW COMPARISON:  One-view chest x-ray 12/29/2022 FINDINGS: Heart is enlarged. Right pleural effusion remains. Mild pulmonary vascular congestion is present bilaterally. A new right-sided PICC line is in place. The tip is at the cavoatrial junction. No pneumothorax is present. The visualized soft tissues and bony thorax are otherwise unremarkable. IMPRESSION: 1. New right-sided PICC line with tip at the cavoatrial junction. 2. Cardiomegaly and mild pulmonary vascular congestion. 3. Persistent right pleural effusion. Electronically Signed   By: Lonni Necessary M.D.   On: 12/30/2022 16:25   US  EKG SITE RITE Result Date: 12/30/2022 If Site Rite image not attached, placement could not be confirmed due to current cardiac rhythm.  DG Chest Port 1 View Result Date: 12/29/2022 CLINICAL DATA:  Altered mental status. EXAM: PORTABLE CHEST 1 VIEW COMPARISON:  11/05/2014 FINDINGS: Extremely shallow inspiration. Heart size and pulmonary vascularity are probably normal for technique. Suggestion of a right pleural effusion with basilar atelectasis or infiltration. No visible pneumothorax. IMPRESSION: Shallow inspiration. Right pleural effusion with basilar atelectasis or infiltration. Electronically Signed   By: Elsie Gravely M.D.   On: 12/29/2022 23:33   CT Head Wo  Contrast Result Date: 12/29/2022 CLINICAL DATA:  Mental status change, unknown cause EXAM: CT HEAD WITHOUT CONTRAST TECHNIQUE: Contiguous axial images were obtained from the base of the skull through the vertex without intravenous contrast. RADIATION DOSE REDUCTION: This exam was performed according to the departmental dose-optimization program which includes automated exposure control, adjustment of the mA and/or kV according to patient size and/or use of iterative reconstruction technique. COMPARISON:  None Available. FINDINGS: Brain: Mild diffuse cerebral atrophy. No acute intracranial abnormality. Specifically, no hemorrhage, hydrocephalus, mass lesion, acute infarction, or significant intracranial injury. Vascular: No hyperdense vessel or unexpected calcification. Skull: No acute calvarial abnormality. Sinuses/Orbits: No acute findings Other: None IMPRESSION: No acute intracranial abnormality. Mild atrophy. Electronically Signed   By: Franky Crease M.D.   On: 12/29/2022 23:28  [2 weeks]  Assessment:   Justin Fleming is a 63 y.o. year old male with history of significant alcohol abuse, anxiety/depression, tobacco abuse, GERD, hepatic steatosis, and HTN who presented to the ED with AMS and found to be in Afib and has had ongoing anemia and stool heme positive.    Anemia, heme positive stool: Suspect potential upper GI source given reports of melena and alcohol abuse. Suspect alcoholic gastritis/esophagitis/duodenitis, but differentials include peptic ulcer disease, malignancy, AVMs, etc. Iron  panel with elevated iron  and iron  duration levels initially, 184 and 84% respectively.  On repeat serum iron  13, TIBC 191, iron  sat 7%.. Folate low, 3.6, B12 158.   Discussed the need to perform both EGD and colonoscopy to find potential source of anemia, heme + stool given he will need anticoagulation for his Afib. He was cleared from cardiac standpoint with plans for procedures today as part of clearing him  to discharge on oral anticoagulation. Yesterday, attending deemed patient lacking capacity to make decisions regarding recommended procedures. Family was agreeable to consent patient for procedures. Patient did not take bowel prep. He has been noted to have intermittent confusion. It has been felt that he does not have capacity to fully understand consequences/risk vs benefit of each treatment option or option of not treating. Today he seems more appropriate and adamantly declines EGD or colonoscopy for now. Dr. Cinderella advising holding off on procedures in light of patient not prepping and goal of  sedation once to complete EGD/colonoscopy. Patient still at risk due to soft BPs.  Drop in platelets during initial part of hospitalization with no chronic findings of liver disease mentioned on chest imaging or throughout the chart. Platelets are now normal.   B12 deficiency: Parietal cell antibody IgG negative. Intrinsic factor Ab negative.    Plan:   Continue PPI. Recommend etoh cessation. EGD/colonoscopy when patient agreeable, likely as outpatient.  Replete folate and B12.  Avoid NSAIDS. Ok to resume heparain. Ok for full liquids.    LOS: 8 days   Sonny RAMAN. Ezzard RIGGERS [facility] [phone] 1/2/202510:15 AM

## 2023-01-07 NOTE — Consult Note (Signed)
 Consultation Note Date: 01/07/2023   Patient Name: Justin Fleming  DOB: [birth year]  MRN: [id number]  Age / Sex: 63 y.o., male  PCP: Patient, No Pcp Per Referring Physician: Pearlean Manus, MD  Reason for Consultation: Establishing goals of care  HPI/Patient Profile: 63 y.o. male  with past medical history of HTN, GERD, anxiety/depression, alcohol abuse with rehab in 2016, currently drinking a pint of liquor per day, admitted on 12/29/2022 with HFpEF, A-fib with RVR/acute myocardial injury due to demand ischemia and acute anemia with heme positive stool with currently stable hemoglobin.   Clinical Assessment and Goals of Care: I have reviewed medical records including EPIC notes, labs and imaging, received report from RN, assessed the patient.  Mr. Justin Fleming is lying quietly in bed.  He appears acutely/chronically ill and somewhat frail.  He will not make or keep eye contact, but I believe this to be a cultural norm for him.  He is alert, oriented x 3, able to make his needs known.  There is no family at bedside at this time. Face-to-face conference with bedside nursing staff related to patient condition, needs.  We meet at the bedside to discuss diagnosis prognosis, GOC, EOL wishes, disposition and options.  I introduced Palliative Medicine as specialized medical care for people living with serious illness. It focuses on providing relief from the symptoms and stress of a serious illness. The goal is to improve quality of life for both the patient and the family.  We discussed a brief life review of the patient.  Mr. Justin Fleming tells me that he is unmarried and has no children.  He shares that he had 9 sisters and 2 brothers.  He has lived on [address] for approximately 25 years.  He has worked several jobs but stopped working a few years ago, taking his [employer].  Although it is reported in the chart that  he is drinking half a gallon per day he tells me that he drinks a pint of liquor per day.  He is calm and cooperative, initially with slowed responses.  Asked our conversation progressed, Mr. Anspach communicated more effectively as trust was built.  We then focused on their current illness.  We talk about Mr. Justin Fleming heart issues and the treatment plan.  We also talk about the goal for GI scope.  At this point Mr. Gains states that he would prefer to not be scoped inpatient, but to manage his heart, get some rehab for some strength in his legs so that he can return home and do GI scope on an outpatient basis if still needed.  We also talk about his alcohol use.  He states that at this point he would like to stop using alcohol.  The natural disease trajectory and expectations at EOL were discussed. Although it is noted that Mr. Justin Fleming appears to not have capacity to make his own decisions or give informed consent regarding medical care, the medical team still cannot compel or by force calls him to take treatments that he  does not wish.  I believe that he is alert and oriented enough to do so, but attending does not, therefore deferral to attendings opinion.  Advanced directives, concepts specific to code status, artifical feeding and hydration, and rehospitalization were considered and discussed.  We talk about CODE STATUS, treat the treatable, but allow a natural passing.  He readily states that he would not want attempted resuscitation.  I encouraged him to share these wishes with his family.  There is a difference of opinion related to [name] ability to make his own choices.  I believe that he is alert and oriented enough to do so, but attending does not, therefore deferral to attendings opinion.  Discussed the importance of continued conversation with family and the medical providers regarding overall plan of care and treatment options, ensuring decisions are within the context of the patient's  values and GOCs.  Questions and concerns were addressed.   The patient was encouraged to call with questions or concerns.  PMT will continue to support holistically.  Conference with attending, GI service, bedside nursing staff, transition of care team related to patient condition, needs, goals of care, disposition.  Attempt to call sister, [name] Fleming.  Male answered who states that Justin Fleming is not home at the time and he does not have her cell phone number.  It would be beneficial to have face-to-face meeting with Mr. Axel and his family for detailed goals of care discussions including CODE STATUS discussions.   HCPOA  NEXT OF KIN -Justin Fleming tells me that he is unmarried and has no children.  He shares that there were 9 girls and 3 boys and his family.  He tells me that he has only 5 sisters left.  He shares that he would trust any of his sisters to make healthcare choices for him.  I share that his Sister [name], has stepped up to be a decision maker if he is unable.  He shares that he is okay with this.    SUMMARY OF RECOMMENDATIONS   It would be beneficial to have face-to-face meeting with Justin Fleming and his family for detailed goals of care discussions including CODE STATUS discussions.  At this point continue to treat the treatable, time for outcomes Justin Fleming states that he would not want attempted resuscitation.   He is not ready for hospice care either.    Code Status/Advance Care Planning: Full code - We talk about CODE STATUS, treat the treatable, but allow a natural passing.  He readily states that he would not want attempted resuscitation.  I encouraged him to share these wishes with his family.  There is a difference of opinion related to Justin Fleming ability to make his own choices.  I believe that he is alert and oriented enough to do so, but attending does not, therefore deferral to attendings opinion.  Symptom Management:  Per hospitalist, no additional needs at  this time.  Palliative Prophylaxis:  Frequent Pain Assessment and Oral Care  Additional Recommendations (Limitations, Scope, Preferences): Full Scope Treatment  Psycho-social/Spiritual:  Desire for further Chaplaincy support:no Additional Recommendations: Caregiving  Support/Resources  Prognosis:  < 12 months, would not be surprising based on chronic illness burden.  Discharge Planning: Would benefit from short-term rehab, ultimate goal is to return home.  Anticipate out patient GI services to follow.        Primary Diagnoses: Present on Admission:  Alcohol intoxication (HCC)  Atrial fibrillation with rapid ventricular response (HCC)  Lactic acidosis  Anemia due to acute blood loss  Tobacco use disorder  Alcohol abuse  Essential hypertension, benign   I have reviewed the medical record, interviewed the patient and family, and examined the patient. The following aspects are pertinent.  Past Medical History:  Diagnosis Date   Alcohol abuse    Rehab 2016   Anxiety    Depression    GERD (gastroesophageal reflux disease)    Headache    Hypertension    Social History   Socioeconomic History   Marital status: Single    Spouse name: Not on file   Number of children: Not on file   Years of education: Not on file   Highest education level: Not on file  Occupational History   Not on file  Tobacco Use   Smoking status: Former    Types: Cigars   Smokeless tobacco: Never   Tobacco comments:    quit x 5 months  Substance and Sexual Activity   Alcohol use: Yes    Alcohol/week: 2.0 - 3.0 standard drinks of alcohol    Types: 2 - 3 Shots of liquor per week    Comment: 2-3 shots liquor each evening   Drug use: No   Sexual activity: Not Currently  Other Topics Concern   Not on file  Social History Narrative   Not on file   Social Drivers of Health   Financial Resource Strain: Not on file  Food Insecurity: No Food Insecurity (12/30/2022)   Hunger Vital Sign     Worried About Running Out of Food in the Last Year: Never true    Ran Out of Food in the Last Year: Never true  Transportation Needs: No Transportation Needs (12/30/2022)   PRAPARE - Administrator, Civil Service (Medical): No    Lack of Transportation (Non-Medical): No  Physical Activity: Not on file  Stress: Not on file  Social Connections: Not on file   Family History  Problem Relation Age of Onset   Colon cancer Maternal Uncle    Scheduled Meds:  Chlorhexidine  Gluconate Cloth  6 each Topical Q0600   cyanocobalamin   1,000 mcg Intramuscular Once   vitamin B-12  1,000 mcg Oral Daily   digoxin   0.25 mg Oral Daily   [START ON 01/09/2023] ferrous sulfate   325 mg Oral Q breakfast   folic acid   1 mg Oral Daily   furosemide   40 mg Intravenous Once   metoprolol  tartrate  37.5 mg Oral QID   midodrine   10 mg Oral TID WC   multivitamin with minerals  1 tablet Oral Daily   pantoprazole  (PROTONIX ) IV  40 mg Intravenous Q12H   polyethylene glycol  17 g Oral Daily   sodium chloride  flush  10-40 mL Intracatheter Q12H   sodium chloride  flush  3 mL Intravenous Q12H   sucralfate   1 g Oral TID WC & HS   thiamine   100 mg Oral Daily   Continuous Infusions:  magnesium  sulfate bolus IVPB 4 g (01/07/23 0810)   PRN Meds:.acetaminophen , mouth rinse, oxyCODONE , sodium chloride  flush Medications Prior to Admission:  Prior to Admission medications   Medication Sig Start Date End Date Taking? Authorizing Provider  acetaminophen  (TYLENOL ) 500 MG tablet Take 500 mg by mouth every 6 (six) hours as needed for moderate pain (pain score 4-6).   Yes [provider]   No Known Allergies Review of Systems  Unable to perform ROS: Other    Physical Exam Vitals and nursing note reviewed.  Constitutional:  General: He is not in acute distress.    Appearance: He is ill-appearing.  Cardiovascular:     Rate and Rhythm: Normal rate.  Pulmonary:     Effort: Pulmonary effort is normal. No  respiratory distress.  Skin:    General: Skin is warm and dry.  Neurological:     Mental Status: He is alert and oriented to person, place, and time.     Comments: There are some question about Mr. Abalos ability to make in-depth decisions.  Psychiatric:        Mood and Affect: Mood normal.        Behavior: Behavior normal.     Comments: Calm and cooperative, initially slowed responses but as conversation progressed communicated more effectively as trust was built.     Vital Signs: BP 92/65   Pulse 98   Temp 98.2 F (36.8 C) (Oral)   Resp 11   Ht 5' 10 (1.778 m)   Wt 88.7 kg   SpO2 100%   BMI 28.06 kg/m  Pain Scale: 0-10 POSS *See Group Information*: S-Acceptable,Sleep, easy to arouse Pain Score: 0-No pain   SpO2: SpO2: 100 % O2 Device:SpO2: 100 % O2 Flow Rate: .O2 Flow Rate (L/min): 2 L/min  IO: Intake/output summary:  Intake/Output Summary (Last 24 hours) at 01/07/2023 0926 Last data filed at 01/07/2023 9163 Gross per 24 hour  Intake 884.68 ml  Output 4300 ml  Net -3415.32 ml    LBM: Last BM Date : 01/06/23 Baseline Weight: Weight: 98.2 kg Most recent weight: Weight: 88.7 kg     Palliative Assessment/Data:     Time In: 1000 Time Out: 1115 Time Total: 75 minutes  Greater than 50%  of this time was spent counseling and coordinating care related to the above assessment and plan.  Signed by: Lorenza DELENA Birkenhead, NP   Please contact Palliative Medicine Team phone at [phone] for questions and concerns.  For individual provider: See Tracey

## 2023-01-07 NOTE — TOC Progression Note (Signed)
 Transition of Care [facility]) - Progression Note    Patient Details  Name: Justin Fleming MRN: [id number] Date of Birth: [birth year]  Transition of Care [facility]) CM/SW Contact  Noreen KATHEE [place], [place] Phone Number: 01/07/2023, 4:37 PM  Clinical Narrative:    CSW unable to reach sister [name]. Man answered and told me to hold so he can find her cell number and phone disconnected. Called back no response. TOC will continue to follow.      Barriers to Discharge: Continued Medical Work up  Expected Discharge Plan and Services       Living arrangements for the past 2 months: Single Family Home                                       Social Determinants of Health (SDOH) Interventions SDOH Screenings   Food Insecurity: No Food Insecurity (12/30/2022)  Housing: Low Risk  (12/30/2022)  Transportation Needs: No Transportation Needs (12/30/2022)  Utilities: Not At Risk (12/30/2022)  Alcohol Screen: Low Risk  (02/02/2018)  Depression (PHQ2-9): Low Risk  (02/02/2018)  Tobacco Use: Medium Risk (01/07/2023)    Readmission Risk Interventions     No data to display

## 2023-01-07 NOTE — Plan of Care (Signed)
  Problem: Education: Goal: Knowledge of General Education information will improve Description: Including pain rating scale, medication(s)/side effects and non-pharmacologic comfort measures Outcome: Not Progressing   Problem: Clinical Measurements: Goal: Ability to maintain clinical measurements within normal limits will improve Outcome: Not Progressing   Problem: Activity: Goal: Risk for activity intolerance will decrease Outcome: Not Progressing   Problem: Nutrition: Goal: Adequate nutrition will be maintained Outcome: Not Progressing   Problem: Skin Integrity: Goal: Risk for impaired skin integrity will decrease Outcome: Not Progressing

## 2023-01-07 NOTE — Progress Notes (Signed)
 MD Branch notified HR in 110s/ BP remains hypotensive.. Per MD holding lasix and metoprolol for now.

## 2023-01-07 NOTE — Significant Event (Addendum)
 Notified by RN of worsening hypotension 73/48 (MAP 54), without other clinical changes. On evaluation patient somnolent, no complaints. Other VS remains in AF rate in the 110s. On exam, no JVD. His edema is much improved from admission, remains with 1-2+ near ankles and significant presacral edema as well. On labs rising bicarb to 38 suspect contraction alkalosis, Hb rising ? Hemoconcentration. Albumin  2.4 which may explain some of his persistent edema. Suspect he is nearing dry wt 88 kg down from 101 kg. Suspect mixed hypovolemic and low output with Afib as cause of worsening hypotension. Will trial dilute Albumin  5% 25 g in 500 cc to see if this improves BP. Continue close monitoring of BP, if worsening consider increasing Midodrine  to 15 mg.   Update: after Albumin , remains hypotensive, slightly improved pressures 83/53 (63). Check lactic, and mixed venous sat off PICC line. Increase Midodrine  to 15 mg with first dose now   Judyann Casasola, MD  [facility]

## 2023-01-07 NOTE — Plan of Care (Signed)
  Problem: Education: Goal: Knowledge of General Education information will improve Description: Including pain rating scale, medication(s)/side effects and non-pharmacologic comfort measures Outcome: Progressing   Problem: Clinical Measurements: Goal: Ability to maintain clinical measurements within normal limits will improve Outcome: Progressing Goal: Will remain free from infection Outcome: Progressing Goal: Diagnostic test results will improve Outcome: Progressing Goal: Respiratory complications will improve Outcome: Progressing   Problem: Activity: Goal: Risk for activity intolerance will decrease Outcome: Progressing   Problem: Nutrition: Goal: Adequate nutrition will be maintained Outcome: Progressing   Problem: Coping: Goal: Level of anxiety will decrease Outcome: Progressing   Problem: Elimination: Goal: Will not experience complications related to bowel motility Outcome: Progressing Goal: Will not experience complications related to urinary retention Outcome: Progressing   Problem: Safety: Goal: Ability to remain free from injury will improve Outcome: Progressing   Problem: Skin Integrity: Goal: Risk for impaired skin integrity will decrease Outcome: Progressing

## 2023-01-07 NOTE — Progress Notes (Addendum)
 PHARMACY - ANTICOAGULATION CONSULT NOTE  Pharmacy Consult for heparin   Indication: atrial fibrillation  No Known Allergies  Patient Measurements: Height: 5' 10 (177.8 cm) Weight: 88.7 kg (195 lb 8.8 oz) IBW/kg (Calculated) : 73 Heparin  Dosing Weight: 93 kg  Vital Signs: Temp: 98.2 F (36.8 C) (01/02 0743) Temp Source: Oral (01/02 0743) BP: 92/65 (01/02 0730) Pulse Rate: 98 (01/02 0700)  Labs: Recent Labs    01/05/23 0432 01/06/23 0448 01/07/23 0409  HGB 8.3* 9.2* 9.8*  HCT 25.8* 27.3* 28.9*  PLT 138* 193 257  HEPARINUNFRC 0.34 0.35 0.35  CREATININE  --  1.09 0.88    Estimated Creatinine Clearance: 95.1 mL/min (by C-G formula based on SCr of 0.88 mg/dL).  Assessment: Justin Fleming is a 63 y.o. year old male admitted on 12/29/2022 with concern for new onset afib with RVR. Currently on diltiazem  drip for rate control. No anticoagulation prior to admission. Pharmacy initially consulted to transition from heparin  to apixaban , but then relented with drop in Hgb and the possibility of additional intervention. Therefore, patient back on heparin . He had received one dose of Eliquis . CHA2DS2VASc is at least 2 (HTN, HFpEF).  Patient continues on IV heparin  this am. Level has remained at goal. Hgb improved to 9.8. No bleeding issues noted.   Noted GI was planning EGD this am, heparin  orders cancelled in preparation. Patient declined to consent nor complete prep. GI is aware and we will follow along.   Goal of Therapy:  Heparin  level 0.3-0.7 units/ml Monitor platelets by anticoagulation protocol: Yes  Plan: Follow up heparin  plan pending GI eval   Addendum:  GI procedures postponed, ok to restart heparin   Dempsey Blush PharmD., BCPS Clinical Pharmacist 01/07/2023 10:17 AM

## 2023-01-07 NOTE — Progress Notes (Addendum)
 Rounding Note    Patient Name: Justin Fleming Date of Encounter: 01/07/2023  [facility] Cardiologist: New  Subjective   No complaints  Inpatient Medications    Scheduled Meds:  Chlorhexidine  Gluconate Cloth  6 each Topical Q0600   cyanocobalamin   1,000 mcg Intramuscular Once   vitamin B-12  1,000 mcg Oral Daily   digoxin   0.25 mg Oral Daily   [START ON 01/09/2023] ferrous sulfate   325 mg Oral Q breakfast   folic acid   1 mg Oral Daily   metoprolol  tartrate  37.5 mg Oral QID   midodrine   10 mg Oral TID WC   multivitamin with minerals  1 tablet Oral Daily   pantoprazole  (PROTONIX ) IV  40 mg Intravenous Q12H   polyethylene glycol  17 g Oral Daily   sodium chloride  flush  10-40 mL Intracatheter Q12H   sodium chloride  flush  3 mL Intravenous Q12H   sucralfate   1 g Oral TID WC & HS   thiamine   100 mg Oral Daily   Continuous Infusions:  magnesium  sulfate bolus IVPB     PRN Meds: acetaminophen , mouth rinse, oxyCODONE , sodium chloride  flush   Vital Signs    Vitals:   01/07/23 0600 01/07/23 0700 01/07/23 0730 01/07/23 0743  BP: (!) 94/57 (!) 88/71 92/65   Pulse: (!) 101 98    Resp: 11 12 11    Temp:    98.2 F (36.8 C)  TempSrc:    Oral  SpO2: 100% 100%    Weight:      Height:        Intake/Output Summary (Last 24 hours) at 01/07/2023 0805 Last data filed at 01/07/2023 0700 Gross per 24 hour  Intake 884.68 ml  Output 3650 ml  Net -2765.32 ml      01/07/2023    4:00 AM 01/06/2023    4:00 AM 01/05/2023    5:00 AM  Last 3 Weights  Weight (lbs) 195 lb 8.8 oz 207 lb 7.3 oz 219 lb 2.2 oz  Weight (kg) 88.7 kg 94.1 kg 99.4 kg      Telemetry    Afib low 100s - Personally Reviewed  ECG    N/a - Personally Reviewed  Physical Exam   GEN: No acute distress.   Neck: No JVD Cardiac: irreg  Respiratory: mild crackles bilateral bases GI: Soft, nontender, non-distended  MS: 1+ bilateral edema Neuro:  Nonfocal  Psych: Normal affect   Labs    High  Sensitivity Troponin:   Recent Labs  Lab 12/30/22 0001 12/30/22 0239  TROPONINIHS 39* 34*     Chemistry Recent Labs  Lab 01/02/23 0857 01/03/23 0516 01/04/23 0320 01/06/23 0448 01/07/23 0409  NA  --    < > 138  137 131* 129*  K  --    < > 3.9  3.9 3.1* 3.8  CL  --    < > 98  99 85* 82*  CO2  --    < > 31  31 35* 38*  GLUCOSE  --    < > 125*  125* 118* 113*  BUN  --    < > 16  16 12 11   CREATININE  --    < > 1.07  1.10 1.09 0.88  CALCIUM  --    < > 8.5*  8.4* 8.6* 8.7*  MG 1.7  --  1.8  --  1.4*  ALBUMIN   --   --  2.4* 2.4* 2.4*  GFRNONAA  --    < > >  60  >60 >60 >60  ANIONGAP  --    < > 9  7 11 9    < > = values in this interval not displayed.    Lipids No results for input(s): CHOL, TRIG, HDL, LABVLDL, LDLCALC, CHOLHDL in the last 168 hours.  Hematology Recent Labs  Lab 01/05/23 0432 01/06/23 0448 01/07/23 0409  WBC 6.5 6.9 8.2  RBC 2.37* 2.62* 2.81*  HGB 8.3* 9.2* 9.8*  HCT 25.8* 27.3* 28.9*  MCV 108.9* 104.2* 102.8*  MCH 35.0* 35.1* 34.9*  MCHC 32.2 33.7 33.9  RDW 19.1* 18.6* 18.3*  PLT 138* 193 257   Thyroid No results for input(s): TSH, FREET4 in the last 168 hours.  BNP Recent Labs  Lab 01/01/23 0456  BNP 549.0*    DDimer  Recent Labs  Lab 12/31/22 1613  DDIMER 2.79*     Radiology    No results found.  Cardiac Studies      Assessment & Plan    1.New onset afib with RVR - new diagnosis this admission, presented with RVR in setting of EtOH intoxication - initially on dilt gtt, rates somewhat difficult to manage due to soft bp's - Concern for compliance and safety of oral anticoagulation in the setting of EtOH use on outpatient basis. Would be hesitant to consider TEE/DCCV without confidence in anticoag compliance. Severe LAE LAVI 55, chronic EtOH abuse likely would be difficult to maintain SR. Some risk to consider amio chemical conversion as well for same reason.    -currently on digoxin  0.25mg  daily, lopressor   37.5mg  every 6 hrs. On midodrine  to help support bp on av nodal agents. Some soft bp's at times, will keep beta blocker at spread out intervals in case need to hold dose.  - able to wean of dilt gtt yesterday.  - On heparin  gtt in short term. Hgb has been stable      2.Acute HFpEF - 12/2022 echo LVEF 55-60%, no WMAs, indet diastolic, RV not well visualized, severe LAE LAVI 55 - BNP up to 549, CXR with CHF. - had significant leg and scrotal edema - negative 3.7 L yesterday, neg 7.5 L since admission. He is on IV lasix  received 60mg  yesterday AM and 40mg  in PM  - some soft bp's today, we will slow down on diuresis. Dose IV lasix  40mg  x 1 later this afternoon.  - consider SGLT2i closer to discharge and after GI procedures.      3. Anemia with positive FOBT - per GI and primary team - plans for endoscopy, patient declined colon prep. Possible EGD today.     4. EtoH abuse      For questions or updates, please contact [facility] Please consult [email or web address] for contact info under        Signed, Alvan Carrier, MD  01/07/2023, 8:05 AM

## 2023-01-08 ENCOUNTER — Encounter (HOSPITAL_COMMUNITY): Payer: Self-pay | Admitting: Internal Medicine

## 2023-01-08 ENCOUNTER — Inpatient Hospital Stay (HOSPITAL_COMMUNITY): Payer: Medicare Other | Admitting: Anesthesiology

## 2023-01-08 ENCOUNTER — Encounter (HOSPITAL_COMMUNITY)
Admission: EM | Disposition: A | Discharge status: Skilled Nursing Facility | Payer: Self-pay | Source: Home / Self Care | Attending: Family Medicine

## 2023-01-08 ENCOUNTER — Inpatient Hospital Stay (HOSPITAL_COMMUNITY): Payer: Self-pay

## 2023-01-08 DIAGNOSIS — J351 Hypertrophy of tonsils: Secondary | ICD-10-CM | POA: Diagnosis not present

## 2023-01-08 DIAGNOSIS — G928 Other toxic encephalopathy: Secondary | ICD-10-CM

## 2023-01-08 DIAGNOSIS — I5030 Unspecified diastolic (congestive) heart failure: Secondary | ICD-10-CM | POA: Diagnosis not present

## 2023-01-08 DIAGNOSIS — K2970 Gastritis, unspecified, without bleeding: Secondary | ICD-10-CM | POA: Diagnosis not present

## 2023-01-08 DIAGNOSIS — I4819 Other persistent atrial fibrillation: Secondary | ICD-10-CM | POA: Diagnosis not present

## 2023-01-08 DIAGNOSIS — D649 Anemia, unspecified: Secondary | ICD-10-CM | POA: Diagnosis not present

## 2023-01-08 HISTORY — PX: ESOPHAGOGASTRODUODENOSCOPY (EGD) WITH PROPOFOL: SHX5813

## 2023-01-08 LAB — COMPREHENSIVE METABOLIC PANEL
ALT: 10 U/L (ref 0–44)
AST: 14 U/L — ABNORMAL LOW (ref 15–41)
Albumin: 2.6 g/dL — ABNORMAL LOW (ref 3.5–5.0)
Alkaline Phosphatase: 48 U/L (ref 38–126)
Anion gap: 9 (ref 5–15)
BUN: 11 mg/dL (ref 8–23)
CO2: 38 mmol/L — ABNORMAL HIGH (ref 22–32)
Calcium: 8.4 mg/dL — ABNORMAL LOW (ref 8.9–10.3)
Chloride: 83 mmol/L — ABNORMAL LOW (ref 98–111)
Creatinine, Ser: 0.89 mg/dL (ref 0.61–1.24)
GFR, Estimated: >60 mL/min (ref >60)
Glucose, Bld: 107 mg/dL — ABNORMAL HIGH (ref 70–99)
Potassium: 3.5 mmol/L (ref 3.5–5.1)
Sodium: 130 mmol/L — ABNORMAL LOW (ref 135–145)
Total Bilirubin: 1.2 mg/dL (ref 0.0–1.2)
Total Protein: 6.2 g/dL — ABNORMAL LOW (ref 6.5–8.1)

## 2023-01-08 LAB — COOXEMETRY PANEL
Carboxyhemoglobin: 2.4 % — ABNORMAL HIGH (ref 0.5–1.5)
Methemoglobin: <0.7 % (ref 0.0–1.5)
O2 Saturation: 73.1 %
Total hemoglobin: 9.3 g/dL — ABNORMAL LOW (ref 12.0–16.0)

## 2023-01-08 LAB — CBC
HCT: 26.2 % — ABNORMAL LOW (ref 39.0–52.0)
Hemoglobin: 9 g/dL — ABNORMAL LOW (ref 13.0–17.0)
MCH: 35 pg — ABNORMAL HIGH (ref 26.0–34.0)
MCHC: 34.4 g/dL (ref 30.0–36.0)
MCV: 101.9 fL — ABNORMAL HIGH (ref 80.0–100.0)
Platelets: 154 10*3/uL (ref 150–400)
RBC: 2.57 MIL/uL — ABNORMAL LOW (ref 4.22–5.81)
RDW: 18.4 % — ABNORMAL HIGH (ref 11.5–15.5)
WBC: 7.7 10*3/uL (ref 4.0–10.5)
nRBC: 0 % (ref 0.0–0.2)

## 2023-01-08 LAB — LACTIC ACID, PLASMA: Lactic Acid, Venous: 0.7 mmol/L (ref 0.5–1.9)

## 2023-01-08 LAB — HEPARIN LEVEL (UNFRACTIONATED): Heparin Unfractionated: 0.11 [IU]/mL — ABNORMAL LOW (ref 0.30–0.70)

## 2023-01-08 LAB — BRAIN NATRIURETIC PEPTIDE: B Natriuretic Peptide: 809 pg/mL — ABNORMAL HIGH (ref 0.0–100.0)

## 2023-01-08 SURGERY — ESOPHAGOGASTRODUODENOSCOPY (EGD) WITH PROPOFOL
Anesthesia: General

## 2023-01-08 MED ORDER — METHYLPREDNISOLONE SODIUM SUCC 125 MG IJ SOLR
60.0000 mg | Freq: Once | INTRAMUSCULAR | Status: AC
  Administered 2023-01-08: 60 mg via INTRAVENOUS
  Filled 2023-01-08: qty 2

## 2023-01-08 MED ORDER — AMIODARONE HCL 200 MG PO TABS
200.0000 mg | ORAL_TABLET | Freq: Two times a day (BID) | ORAL | Status: DC
  Administered 2023-01-08 – 2023-01-11 (×7): 200 mg via ORAL
  Filled 2023-01-08 (×7): qty 1

## 2023-01-08 MED ORDER — HEPARIN (PORCINE) 25000 UT/250ML-% IV SOLN: 2250.0000 [IU]/h | INTRAVENOUS | Status: DC

## 2023-01-08 MED ORDER — LACTATED RINGERS IV BOLUS
250.0000 mL | Freq: Once | INTRAVENOUS | Status: AC
  Administered 2023-01-08: 250 mL via INTRAVENOUS

## 2023-01-08 MED ORDER — ALBUMIN HUMAN 25 % IV SOLN
25.0000 g | Freq: Once | INTRAVENOUS | Status: AC
Start: 2023-01-08 — End: 2023-01-08
  Administered 2023-01-08: 25 g via INTRAVENOUS
  Filled 2023-01-08: qty 100

## 2023-01-08 MED ORDER — PHENYLEPHRINE 80 MCG/ML (10ML) SYRINGE FOR IV PUSH (FOR BLOOD PRESSURE SUPPORT)
PREFILLED_SYRINGE | INTRAVENOUS | Status: AC
  Filled 2023-01-08: qty 10

## 2023-01-08 MED ORDER — ALBUMIN HUMAN 25 % IV SOLN
25.0000 g | Freq: Once | INTRAVENOUS | Status: AC
  Administered 2023-01-09: 25 g via INTRAVENOUS
  Filled 2023-01-08: qty 100

## 2023-01-08 MED ORDER — COSYNTROPIN 0.25 MG IJ SOLR
0.2500 mg | Freq: Once | INTRAMUSCULAR | Status: AC
  Administered 2023-01-09: 0.25 mg via INTRAVENOUS
  Filled 2023-01-08: qty 0.25

## 2023-01-08 MED ORDER — DIPHENHYDRAMINE HCL 25 MG PO CAPS
25.0000 mg | ORAL_CAPSULE | Freq: Three times a day (TID) | ORAL | Status: DC | PRN
  Administered 2023-01-08 – 2023-01-15 (×3): 25 mg via ORAL
  Filled 2023-01-08 (×3): qty 1

## 2023-01-08 MED ORDER — MIDODRINE HCL 5 MG PO TABS
15.0000 mg | ORAL_TABLET | Freq: Three times a day (TID) | ORAL | Status: DC
  Administered 2023-01-08 – 2023-01-16 (×27): 15 mg via ORAL
  Filled 2023-01-08 (×27): qty 3

## 2023-01-08 MED ORDER — LACTATED RINGERS IV SOLN: INTRAVENOUS | Status: DC | PRN

## 2023-01-08 MED ORDER — PROPOFOL 10 MG/ML IV BOLUS
INTRAVENOUS | Status: DC | PRN
  Administered 2023-01-08: 50 mg via INTRAVENOUS
  Administered 2023-01-08: 20 mg via INTRAVENOUS

## 2023-01-08 MED ORDER — NALTREXONE HCL 50 MG PO TABS
25.0000 mg | ORAL_TABLET | Freq: Every day | ORAL | Status: DC
  Administered 2023-01-09: 25 mg via ORAL
  Filled 2023-01-08 (×4): qty 1

## 2023-01-08 MED ORDER — HYDROMORPHONE HCL 1 MG/ML IJ SOLN: 0.5000 mg | INTRAMUSCULAR | Status: DC | PRN

## 2023-01-08 MED ORDER — LIDOCAINE HCL (PF) 2 % IJ SOLN
INTRAMUSCULAR | Status: DC | PRN
  Administered 2023-01-08: 100 mg via INTRADERMAL

## 2023-01-08 MED ORDER — PHENYLEPHRINE 80 MCG/ML (10ML) SYRINGE FOR IV PUSH (FOR BLOOD PRESSURE SUPPORT)
PREFILLED_SYRINGE | INTRAVENOUS | Status: DC | PRN
  Administered 2023-01-08: 80 ug via INTRAVENOUS

## 2023-01-08 NOTE — Interval H&P Note (Signed)
 History and Physical Interval Note:  01/08/2023 11:35 AM  Justin Fleming  has presented today for surgery, with the diagnosis of Anemia , positive FOBT.  The various methods of treatment have been discussed with the patient and family. After consideration of risks, benefits and other options for treatment, the patient has consented to  Procedure(s): ESOPHAGOGASTRODUODENOSCOPY (EGD) WITH PROPOFOL  (N/A) COLONOSCOPY WITH PROPOFOL  (N/A) as a surgical intervention.  The patient's history has been reviewed, patient examined, no change in status, stable for surgery.  I have reviewed the patient's chart and labs.  Questions were answered to the patient's satisfaction.     [name]

## 2023-01-08 NOTE — Transfer of Care (Signed)
 Immediate Anesthesia Transfer of Care Note  Patient: Justin Fleming  Procedure(s) Performed: ESOPHAGOGASTRODUODENOSCOPY (EGD) WITH PROPOFOL   Patient Location: PACU  Anesthesia Type:General  Level of Consciousness: awake, alert , and oriented  Airway & Oxygen Therapy: Patient Spontanous Breathing and Patient connected to nasal cannula oxygen  Post-op Assessment: Report given to RN and Post -op Vital signs reviewed and stable  Post vital signs: Reviewed and stable  Last Vitals:  Vitals Value Taken Time  BP 96/66 01/08/23 1238  Temp 36.6 C 01/08/23 1238  Pulse 109 01/08/23 1239  Resp 18 01/08/23 1239  SpO2 93 % 01/08/23 1239  Vitals shown include unfiled device data.  Last Pain:  Vitals:   01/08/23 1142  TempSrc: Oral  PainSc: 0-No pain      Patients Stated Pain Goal: 5 (01/08/23 1142)  Complications: No notable events documented.

## 2023-01-08 NOTE — Progress Notes (Addendum)
 PROGRESS NOTE   Patient: Justin Fleming                            PCP: Patient, No Pcp Per                    DOB: [birth year]            DOA: 12/29/2022 FMW:[id number]             DOS: 01/08/2023, 12:11 PM   LOS: 9 days   Date of Service: The patient was seen and examined on 01/08/2023  Subjective:   The patient was seen and examined, more awake this morning, following command, family present at bedside Denies any chest pain or shortness of breath Hemodynamically stable no issues overnight Remained hypotensive Hemoglobin dropped from 9.2, 9.8, 9.0 this morning   Brief Narrative:   History limited due to patient's intoxication/AMS, obtained by patient, chart review, EDP Azarel Banner Aderman is a 63 y.o. male with hx of alcohol abuse, hypertension, mood disorder, smoking, GERD, who was brought in after bystander contacted EMS because he was sitting in his car in the parking lot.   Found have alcohol intoxication, A-fib with RVR, suspected aspiration pneumonia, and worsening lactic acidosis with fluids.  Clinically suspect decompensated heart failure.  Denies any known history of A-fib.  EKG:  A-fib with RVR rate 159, LAD, LAFB, inferior Q's, poor R wave progression, T wave inversion laterally, no overt ischemic changes.    ED Course:  Found to be in A-fib with RVR, initially treated with metoprolol  2.5 mg IV x 1 with slight worsening of pressures to systolic in the 90s.  Started on heparin  for anticoagulation.  Treated with ceftriaxone /azithromycin , 2 L IV fluid, potassium 40 mg IV and mag 4 g.  At my request consulted with cardiology fellow re: rate control options.  Cards recommending possibly metoprolol  IV prn or digoxin , recommend against amiodarone .   ================================================================================   Assessment/Plan:  1)HFpEF --acute diastolic dysfunction CHF-echo as noted below #2 -Clinically appears volume overloaded--+ve JVD, significant  peripheral edema including edema of the genitalia -Chest x-ray on admission on 12/30/2022   and repeat chest x-ray from 01/03/2023 showed pulmonary venous congestion and right sided pleural effusion -CTA on 12/31/2022 with bilateral pleural effusions -Daily weights, fluid input and output monitoring as ordered REDs Vest 01/07/23 -Dyspnea improving -Diuresing well--negative fluid balance -Cardiology following -Lasix  on hold due to BP concerns -Midodrine  added for BP support -Voiding well--- I's and O's not acute due to difficulty with male condom catheter  01/08/2023 Mildly hypotensive, Lasix  and metoprolol  were held, 250 of normal saline bolus given -Proceeding with EGD/colonoscopy today    2)A-fib with RVR -new onset -New onset  in the setting of alcohol intoxication -CHA2DS2-VASc is at least 1 (HTN) -Echo from 12/31/2022 with EF of 55 to 60%, bilateral atrial enlargement noted, no significant valvular abnormalities --TSH -normal at 2.7 - Potassium and magnesium  WNL -Continue IV heparin  for anticoagulation -Rate control remains challenging -Continue IV Cardizem  for rate control--as BP allows -Cardiologist recommends titration of metoprolol  and added digoxin ---  -Dig level 0.9 on 01/04/23 -Not a candidate for cardioversion as to be difficult to commit to long-term anticoagulation post cardioversion in the setting of GI bleed without endoluminal evaluation  3)Acute hypoxic respiratory failure--- due to #1 and  #2 above -Clinically and radiologically no significant PNA---- -No leukocytosis, No fevers, PCT Negative -Lactic acidosis improved with hydration -Blood  cultures NGTD -Patient was initially treated with Rocephin -- -No Further antibiotics indicated -Hypoxia resolved---currently on room air -Frank Sepsis ruled out  4)Acute metabolic encephalopathy-----due to above -CT Head with mild diffuse atrophy, no acute abnormalities.   --Intermittently confused and  disoriented-stable  5)Acute myocardial injury/demand ischemia in the setting of A-fib with RVR -Ischemic demand due to A-fib with RVR -Troponin 39 >> 34, -  EKG is A-fib with RVR, nonspecific changes without overt ischemic changes. -Management of alcohol withdrawal, A-fib RVR, heart failure, borderline hypotension, aspiration pneumonia per above. -Echo from 12/31/2022 with preserved EF, without wall motion abnormalities -Appreciate cardiology recommendations   6)-acute anemia with heme positive stool--- ??  Upper GI bleed in an alcoholic male--- anemia workup consistent with iron  deficiency, B12 and folate deficiency as well -Appears macrocytic and hyperchromic - Folate 3.6, B12 -158----B12 and folic acid  replacement initiated -- Iron  13, saturation 7% --- iron  supplementation ordered Hgb trended down from 11.5 on admission     Latest Ref Rng & Units 01/08/2023    3:03 AM 01/07/2023    4:09 AM 01/06/2023    4:48 AM  CBC  WBC 4.0 - 10.5 K/uL 7.7  8.2  6.9   Hemoglobin 13.0 - 17.0 g/dL 9.0  9.8  9.2   Hematocrit 39.0 - 52.0 % 26.2  28.9  27.3   Platelets 150 - 400 K/uL 154  257  193     -Continue IV PPI -GI consult appreciated: Planning for EGD/colonoscopy today 01/08/2023 -Patient is noncompliant with colonoscopy prep (last colonoscopy was in 2016, no prior EGD) -Agreed to have EGD and colonoscopy   7)AKI----acute kidney injury---due to dehydration -  creatinine on admission= 1.31 , - baseline creatinine =1.0     -Creatinine has normalized  -renally adjust medications, avoid nephrotoxic agents / dehydration  / hypotension  8)Social/Ethics--- prior to admission lived alone, admits to heavy alcohol abuse -Patient Sister [name] is primary contact -Discussed with sisters ---[name] and [name] as well as [name] and [name]--   9)Alcohol intoxication/acute metabolic encephalopathy -Reports drinking up to 1.75 L of liquor a day --Patient completed Librium  taper,  ,  continue folic acid  thiamine  and multivitamin  --intermittently confused, with episodes of disorientation and forgetfulness -Refusing medications----intermittently uncooperative and noncompliant   10)Fatty Liver with mild ascites--- in the setting of chronic alcohol abuse -Right upper quadrant ultrasound from 01/04/2023 noted -Supportive management -Diuretics as above  11)Asymptomatic cholelithiasis----right upper quadrant ultrasound shows.. Contracted gallbladder with stones and sludge. No ductal dilatation.    12) diffuse pruritic rash -Possibly due to chlorhexidine  wash -As needed Benadryl  IV steroid given -Will monitor   13) Capacity evaluation----per Dr. Rendall Sinner: Vicenta CROME Kyte is  a  63 y.o.  who is AAO x 3 without significant depressive symptoms and without delusion/psychosis who is unable to understand (without significant language barrier) his current medical diagnosis, patient also unable to accurately verbalizes understanding of proposed treatment options including option of no treatment, the consequences/risk versus benefit of each treatment option, alternatives as well as the option of no treatment. -- Based on my evaluation [place] L Giannone  appears to Not have the Capacity to make decisions or give informed consent about his medical care.  A surrogate decision-maker is required as Vicenta L Snare  appears to Not have capacity to make his own decisions and give informed consent regarding his medical care ---Patient remains forgetful and intermittently confused Discussed with sisters ---[name] and [name] as well as Leonel Norleen  Ash and [name]--  at bedside--- they have offered to make medical decisions for patient  ----------------------------------------------------------------------------------------------------------------------------------------------- Nutritional status:  The patient's BMI is: Body mass index is 28.37 kg/m. I agree with the  assessment and plan as outlined below: Nutrition Status:  DVT prophylaxis:  Heparin  drip  Code Status:   Code Status: Full Code  Family Communication: Discussed with sisters ---[name] and [name] as well as [name] and [name]--  Admission status:    Inpatient  Disposition: From  - home             Planning for discharge to SNF rehab Vs Home with [facility] Procedures:   Antimicrobials:  Anti-infectives (From admission, onward)    Start     Dose/Rate Route Frequency Ordered Stop   12/31/22 0000  cefTRIAXone  (ROCEPHIN ) 2 g in sodium chloride  0.9 % 100 mL IVPB        2 g 200 mL/hr over 30 Minutes Intravenous Every 24 hours 12/30/22 0443 01/02/23 0013   12/30/22 2200  azithromycin  (ZITHROMAX ) tablet 500 mg        500 mg Oral Daily at bedtime 12/30/22 0443 12/31/22 2112   12/30/22 0000  cefTRIAXone  (ROCEPHIN ) 2 g in sodium chloride  0.9 % 100 mL IVPB        2 g 200 mL/hr over 30 Minutes Intravenous  Once 12/29/22 2345 12/30/22 0100   12/30/22 0000  azithromycin  (ZITHROMAX ) 500 mg in sodium chloride  0.9 % 250 mL IVPB        500 mg 250 mL/hr over 60 Minutes Intravenous  Once 12/29/22 2345 12/30/22 0158      Medication:   [MAR Hold] amiodarone   200 mg Oral BID   [MAR Hold] cyanocobalamin   1,000 mcg Intramuscular Once   [MAR Hold] vitamin B-12  1,000 mcg Oral Daily   [MAR Hold] digoxin   0.25 mg Oral Daily   [MAR Hold] ferrous sulfate   325 mg Oral Q breakfast   [MAR Hold] folic acid   1 mg Oral Daily   [MAR Hold] furosemide   40 mg Intravenous Once   [MAR Hold] metoprolol  tartrate  37.5 mg Oral QID   [MAR Hold] midodrine   15 mg Oral TID WC   [MAR Hold] multivitamin with minerals  1 tablet Oral Daily   [MAR Hold] naltrexone   25 mg Oral Daily   [MAR Hold] pantoprazole  (PROTONIX ) IV  40 mg Intravenous Q12H   [MAR Hold] polyethylene glycol  17 g Oral Daily   [MAR Hold] sodium chloride  flush  10-40 mL Intracatheter Q12H   [MAR Hold] sodium chloride  flush  3 mL Intravenous  Q12H   [MAR Hold] sucralfate   1 g Oral TID WC & HS   [MAR Hold] thiamine   100 mg Oral Daily   [MAR Hold] acetaminophen , [MAR Hold] diphenhydrAMINE , [MAR Hold]  HYDROmorphone  (DILAUDID ) injection, [MAR Hold] mouth rinse, [MAR Hold] oxyCODONE , [MAR Hold] sodium chloride  flush  Objective:   Vitals:   01/08/23 1000 01/08/23 1003 01/08/23 1030 01/08/23 1142  BP: (!) 90/45 (!) 85/55 (!) 89/66 108/76  Pulse:   92 96  Resp: 12 15 (!) 0   Temp:    98.2 F (36.8 C)  TempSrc:    Oral  SpO2:  99% 100% 100%  Weight:    89.7 kg  Height:    5' 10 (1.778 m)    Intake/Output Summary (Last 24 hours) at 01/08/2023 1211 Last data filed at 01/08/2023 0938 Gross per 24 hour  Intake 487.83 ml  Output 900 ml  Net -412.17 ml   Filed Weights   01/07/23 0400 01/08/23 0700 01/08/23 1142  Weight: 88.7 kg 89.7 kg 89.7 kg    Physical examination:         General:  AAO x 3,  cooperative, no distress;   HEENT:  Normocephalic, PERRL, otherwise with in Normal limits   Neuro:  Tremors, CNII-XII intact. , normal motor and sensation, reflexes intact   Lungs:   Clear to auscultation BL, Respirations unlabored,  No wheezes / crackles  Cardio:    S1/S2, RRR, No murmure, No Rubs or Gallops   Abdomen:  Soft, non-tender, bowel sounds active all four quadrants, no guarding or peritoneal signs.  Muscular  skeletal:  Limited exam -global generalized weaknesses - in bed, able to move all 4 extremities,   2+ pulses,  symmetric, No pitting edema  Skin:  Dry, warm to touch, diffuse pruritic rash, blanching   GU-improving penile and Scrotal Edema MSK--Rt arm PICC Line in situ Wounds: Please see nursing documentation  LABs:     Latest Ref Rng & Units 01/08/2023    3:03 AM 01/07/2023    4:09 AM 01/06/2023    4:48 AM  CBC  WBC 4.0 - 10.5 K/uL 7.7  8.2  6.9   Hemoglobin 13.0 - 17.0 g/dL 9.0  9.8  9.2   Hematocrit 39.0 - 52.0 % 26.2  28.9  27.3   Platelets 150 - 400 K/uL 154  257  193       Latest Ref Rng &  Units 01/08/2023    3:03 AM 01/07/2023    4:09 AM 01/06/2023    4:48 AM  CMP  Glucose 70 - 99 mg/dL 892  886  881   BUN 8 - 23 mg/dL 11  11  12    Creatinine 0.61 - 1.24 mg/dL 9.10  9.11  8.90   Sodium 135 - 145 mmol/L 130  129  131   Potassium 3.5 - 5.1 mmol/L 3.5  3.8  3.1   Chloride 98 - 111 mmol/L 83  82  85   CO2 22 - 32 mmol/L 38  38  35   Calcium 8.9 - 10.3 mg/dL 8.4  8.7  8.6   Total Protein 6.5 - 8.1 g/dL 6.2     Total Bilirubin 0.0 - 1.2 mg/dL 1.2     Alkaline Phos 38 - 126 U/L 48     AST 15 - 41 U/L 14     ALT 0 - 44 U/L 10      Micro Results Recent Results (from the past 240 hours)  Culture, blood (routine x 2)     Status: None   Collection Time: 12/30/22 12:51 AM   Specimen: BLOOD  Result Value Ref Range Status   Specimen Description BLOOD BLOOD RIGHT ARM  Final   Special Requests   Final    BOTTLES DRAWN AEROBIC ONLY Blood Culture results may not be optimal due to an inadequate volume of blood received in culture bottles   Culture   Final    NO GROWTH 5 DAYS Performed at [facility], [address], [place], [place] [address]    Report Status 01/04/2023 FINAL  Final  Culture, blood (routine x 2)     Status: None   Collection Time: 12/30/22 12:55 AM   Specimen: BLOOD LEFT FOREARM  Result Value Ref Range Status   Specimen Description BLOOD LEFT FOREARM  Final   Special Requests   Final    BOTTLES DRAWN AEROBIC  AND ANAEROBIC Blood Culture results may not be optimal due to an inadequate volume of blood received in culture bottles   Culture   Final    NO GROWTH 5 DAYS Performed at [facility], [address] [place], [place] [address]    Report Status 01/04/2023 FINAL  Final  Respiratory (~20 pathogens) panel by PCR     Status: None   Collection Time: 12/30/22  3:59 AM   Specimen: Nasopharyngeal Swab; Respiratory  Result Value Ref Range Status   Adenovirus NOT DETECTED NOT DETECTED Final   Coronavirus 229E NOT DETECTED NOT DETECTED Final    Comment: (NOTE) The  Coronavirus on the Respiratory Panel, DOES NOT test for the novel  Coronavirus (2019 nCoV)    Coronavirus HKU1 NOT DETECTED NOT DETECTED Final   Coronavirus NL63 NOT DETECTED NOT DETECTED Final   Coronavirus OC43 NOT DETECTED NOT DETECTED Final   Metapneumovirus NOT DETECTED NOT DETECTED Final   Rhinovirus / Enterovirus NOT DETECTED NOT DETECTED Final   Influenza A NOT DETECTED NOT DETECTED Final   Influenza B NOT DETECTED NOT DETECTED Final   Parainfluenza Virus 1 NOT DETECTED NOT DETECTED Final   Parainfluenza Virus 2 NOT DETECTED NOT DETECTED Final   Parainfluenza Virus 3 NOT DETECTED NOT DETECTED Final   Parainfluenza Virus 4 NOT DETECTED NOT DETECTED Final   Respiratory Syncytial Virus NOT DETECTED NOT DETECTED Final   Bordetella pertussis NOT DETECTED NOT DETECTED Final   Bordetella Parapertussis NOT DETECTED NOT DETECTED Final   Chlamydophila pneumoniae NOT DETECTED NOT DETECTED Final   Mycoplasma pneumoniae NOT DETECTED NOT DETECTED Final    Comment: Performed at [facility], [address] [place], [place] [address]  MRSA Next Gen by PCR, Nasal     Status: None   Collection Time: 12/30/22  9:06 AM   Specimen: Nasal Mucosa; Nasal Swab  Result Value Ref Range Status   MRSA by PCR Next Gen NOT DETECTED NOT DETECTED Final    Comment: (NOTE) The GeneXpert MRSA Assay (FDA approved for NASAL specimens only), is one component of a comprehensive MRSA colonization surveillance program. It is not intended to diagnose MRSA infection nor to guide or monitor treatment for MRSA infections. Test performance is not FDA approved in patients less than 3 years old. Performed at [facility], [address] [place], [place] [address]   SARS Coronavirus 2 by RT PCR (hospital order, performed in [facility] hospital lab) *cepheid single result test* Anterior Nasal Swab     Status: None   Collection Time: 12/31/22 12:31 PM   Specimen: Anterior Nasal Swab  Result Value Ref Range Status    SARS Coronavirus 2 by RT PCR NEGATIVE NEGATIVE Final    Comment: (NOTE) SARS-CoV-2 target nucleic acids are NOT DETECTED.  The SARS-CoV-2 RNA is generally detectable in upper and lower respiratory specimens during the acute phase of infection. The lowest concentration of SARS-CoV-2 viral copies this assay can detect is 250 copies / mL. A negative result does not preclude SARS-CoV-2 infection and should not be used as the sole basis for treatment or other patient management decisions.  A negative result may occur with improper specimen collection / handling, submission of specimen other than nasopharyngeal swab, presence of viral mutation(s) within the areas targeted by this assay, and inadequate number of viral copies (<250 copies / mL). A negative result must be combined with clinical observations, patient history, and epidemiological information.  Fact Sheet for Patients:   roadlaptop.co.za  Fact Sheet  for Healthcare Providers: http://kim-miller.com/  This test is not yet approved or  cleared by the [place]  FDA and has been authorized for detection and/or diagnosis of SARS-CoV-2 by FDA under an Emergency Use Authorization (EUA).  This EUA will remain in effect (meaning this test can be used) for the duration of the COVID-19 declaration under Section 564(b)(1) of the Act, 21 U.S.C. section 360bbb-3(b)(1), unless the authorization is terminated or revoked sooner.  Performed at [facility], [address], [place], [place] [address]   Culture, blood (Routine X 2) w Reflex to ID Panel     Status: None (Preliminary result)   Collection Time: 01/04/23  4:19 PM   Specimen: BLOOD LEFT HAND  Result Value Ref Range Status   Specimen Description BLOOD LEFT HAND  Final   Special Requests   Final    BOTTLES DRAWN AEROBIC AND ANAEROBIC Blood Culture adequate volume   Culture   Final    NO GROWTH 4 DAYS Performed at [facility],  [address], [place], [place] [address]    Report Status PENDING  Incomplete  Culture, blood (Routine X 2) w Reflex to ID Panel     Status: None (Preliminary result)   Collection Time: 01/04/23  4:19 PM   Specimen: BLOOD LEFT WRIST  Result Value Ref Range Status   Specimen Description BLOOD LEFT WRIST  Final   Special Requests   Final    BOTTLES DRAWN AEROBIC AND ANAEROBIC Blood Culture adequate volume   Culture   Final    NO GROWTH 4 DAYS Performed at [facility], [address] [place], [place] [address]    Report Status PENDING  Incomplete   SIGNED: Adriana DELENA Grams, MD,  - [facility] Critical care time spent 55 minutes [facility],  Pager (please use [email or web address] to page/ text) Please use Epic Secure Chat for non-urgent communication (7AM-7PM)  If 7PM-7AM, please contact night-coverage [email or web address], 01/08/2023, 12:11 PM

## 2023-01-08 NOTE — Anesthesia Preprocedure Evaluation (Addendum)
 Anesthesia Evaluation  Patient identified by MRN, date of birth, ID band Patient awake    Reviewed: Allergy & Precautions, H&P , NPO status , Patient's Chart, lab work & pertinent test results, reviewed documented beta blocker date and time   Airway Mallampati: II  TM Distance: >3 FB Neck ROM: full    Dental no notable dental hx.    Pulmonary former smoker   Pulmonary exam normal breath sounds clear to auscultation       Cardiovascular Exercise Tolerance: Good hypertension, + dysrhythmias Atrial Fibrillation  Rhythm:regular Rate:Normal     Neuro/Psych  Headaches PSYCHIATRIC DISORDERS Anxiety Depression       GI/Hepatic Neg liver ROS,GERD  ,,  Endo/Other  negative endocrine ROS    Renal/GU negative Renal ROS  negative genitourinary   Musculoskeletal   Abdominal   Peds  Hematology  (+) Blood dyscrasia, anemia   Anesthesia Other Findings New onset Afib with RVR Needs EGD to start anticoagulation  Reproductive/Obstetrics negative OB ROS                             Anesthesia Physical Anesthesia Plan  ASA: 4 and emergent  Anesthesia Plan: General   Post-op Pain Management:    Induction:   PONV Risk Score and Plan: Propofol  infusion  Airway Management Planned:   Additional Equipment:   Intra-op Plan:   Post-operative Plan:   Informed Consent: I have reviewed the patients History and Physical, chart, labs and discussed the procedure including the risks, benefits and alternatives for the proposed anesthesia with the patient or authorized representative who has indicated his/her understanding and acceptance.     Dental Advisory Given  Plan Discussed with: CRNA  Anesthesia Plan Comments:        Anesthesia Quick Evaluation

## 2023-01-08 NOTE — Op Note (Signed)
 [facility] Patient Name: Justin Fleming Procedure Date: 01/08/2023 11:28 AM MRN: [id number] Date of Birth: [birth year] Attending MD: Carlin POUR. Cindie , [place], [id number] CSN: [id number] Age: 63 Admit Type: Inpatient Procedure:                Upper GI endoscopy Indications:              Unexplained iron  deficiency anemia Providers:                Carlin POUR. Cindie, DO, [place]vere Lodge, Bascom Blush Referring MD:              Medicines:                See the Anesthesia note for documentation of the                            administered medications Complications:            No immediate complications. Estimated Blood Loss:     Estimated blood loss: none. Procedure:                Pre-Anesthesia Assessment:                           - The anesthesia plan was to use monitored                            anesthesia care (MAC).                           After obtaining informed consent, the endoscope was                            passed under direct vision. Throughout the                            procedure, the patient's blood pressure, pulse, and                            oxygen saturations were monitored continuously. The                            GIF-H190 ([id number]) scope was introduced through the                            mouth, and advanced to the second part of duodenum.                            The upper GI endoscopy was accomplished without                            difficulty. The patient tolerated the procedure                            well. Scope In: 12:25:57 PM Scope Out: 12:32:59 PM Total Procedure Duration: 0 hours 7 minutes 2 seconds  Findings:      Benign tonsillar hypertrophy.  The Z-line was regular and was found 42 cm from the incisors.      Patchy mild inflammation characterized by erythema was found in the       gastric body.      The duodenal bulb, first portion of the duodenum and second portion of       the duodenum were normal. Impression:                - Benign tonsillar hypertrophy.                           - Z-line regular, 42 cm from the incisors.                           - Gastritis.                           - Normal duodenal bulb, first portion of the                            duodenum and second portion of the duodenum.                           - No specimens collected. Moderate Sedation:      Per Anesthesia Care Recommendation:           - Return patient to hospital ward for ongoing care.                           - Resume regular diet.                           - Okay to switch PPI to p.o. twice daily.                           - Nothing on today's exam precludes patient from                            receiving systemic anticoagulation. Needs                            outpatient colonoscopy. Patient has refused                            inpatient evaluation (will not drink colon prep). Procedure Code(s):        --- Professional ---                           [phone], Esophagogastroduodenoscopy, flexible,                            transoral; diagnostic, including collection of                            specimen(s) by brushing or washing, when performed                            (  separate procedure) Diagnosis Code(s):        --- Professional ---                           K29.70, Gastritis, unspecified, without bleeding                           D50.9, Iron  deficiency anemia, unspecified CPT copyright 2022 American Medical Association. All rights reserved. The codes documented in this report are preliminary and upon coder review may  be revised to meet current compliance requirements. Carlin POUR. Cindie, DO Carlin POUR. Cindie, DO 01/08/2023 12:38:09 PM This report has been signed electronically. Number of Addenda: 0

## 2023-01-08 NOTE — TOC Progression Note (Addendum)
 Transition of Care [facility]) - Progression Note    Patient Details  Name: Justin Fleming MRN: [id number] Date of Birth: 1960/08/18  Transition of Care [facility]) CM/SW Contact  Noreen KATHEE [place], [place] Phone Number: 01/08/2023, 1:21 PM  Clinical Narrative:    CSW attempted to reach out to dorothy , patient sister. CSW had to leave a confidential voicemail for a return call back. TOC will continue to follow   CSW did reach out to [employer] counselor and was informed that patient is currently under review with [employer] for a Medicaid screening.    Barriers to Discharge: Continued Medical Work up  Expected Discharge Plan and Services     Living arrangements for the past 2 months: Single Family Home                    Social Determinants of Health (SDOH) Interventions SDOH Screenings   Food Insecurity: No Food Insecurity (12/30/2022)  Housing: Low Risk  (12/30/2022)  Transportation Needs: No Transportation Needs (12/30/2022)  Utilities: Not At Risk (12/30/2022)  Alcohol Screen: Low Risk  (02/02/2018)  Depression (PHQ2-9): Low Risk  (02/02/2018)  Tobacco Use: Medium Risk (01/08/2023)    Readmission Risk Interventions     No data to display

## 2023-01-08 NOTE — Progress Notes (Signed)
 Patient seen and evaluated for candidacy of procedures today.  Initial plan for EGD and colonoscopy however patient did not prep for colonoscopy and does not want to proceed with this.  Family at bedside this morning, spoke with [name] (patient's sister).  They would like to keep with plan for at least upper endoscopy today.  In reviewing his labs from this morning his hemoglobin is stable at 9.  No signs of any overt bleeding reports of melena/BRBPR overnight.  Per review of his vitals, his BP has been on the soft side with systolics low 70s-90s / 50s/60s.  He has been receiving midodrine .  Spoke with Dr. Willette who has ordered a 250 cc bolus.  Per discussion with Dr. Cindie and anesthesia we will continue with plan for EGD today to assess for upper GI source of anemia as long as he maintains MAP > 60.   He will need eventual colonoscopy at some point however he has been noncompliant with prep while inpatient and likely will remain noncompliant outpatient. He answers all orientation questions appropriately this morning.  Abdomen is soft and he denies any abdominal pain, nausea, or vomiting.  Heparin  drip has been on hold since 6 AM.   Charmaine Melia, MSN, APRN, FNP-BC, AGACNP-BC [facility] at [facility]

## 2023-01-08 NOTE — Progress Notes (Signed)
 Rounding Note    Patient Name: Justin Fleming Date of Encounter: 01/08/2023  [facility] Cardiologist: Debera   Subjective   Lethargic spoke with family   Inpatient Medications    Scheduled Meds:  cyanocobalamin   1,000 mcg Intramuscular Once   vitamin B-12  1,000 mcg Oral Daily   digoxin   0.25 mg Oral Daily   [START ON 01/09/2023] ferrous sulfate   325 mg Oral Q breakfast   folic acid   1 mg Oral Daily   furosemide   40 mg Intravenous Once   metoprolol  tartrate  37.5 mg Oral QID   midodrine   15 mg Oral TID WC   multivitamin with minerals  1 tablet Oral Daily   pantoprazole  (PROTONIX ) IV  40 mg Intravenous Q12H   polyethylene glycol  17 g Oral Daily   sodium chloride  flush  10-40 mL Intracatheter Q12H   sodium chloride  flush  3 mL Intravenous Q12H   sucralfate   1 g Oral TID WC & HS   thiamine   100 mg Oral Daily   Continuous Infusions:  sodium chloride      sodium chloride      albumin  human     heparin      PRN Meds: acetaminophen , mouth rinse, oxyCODONE , sodium chloride  flush   Vital Signs    Vitals:   01/08/23 0600 01/08/23 0630 01/08/23 0700 01/08/23 0730  BP: (!) 85/59 (!) 77/61  103/70  Pulse: 96 (!) 109    Resp: 19 12  17   Temp:      TempSrc:      SpO2: 99% 100%    Weight:   89.7 kg   Height:        Intake/Output Summary (Last 24 hours) at 01/08/2023 0834 Last data filed at 01/08/2023 0700 Gross per 24 hour  Intake 483.23 ml  Output 1550 ml  Net -1066.77 ml      01/08/2023    7:00 AM 01/07/2023    4:00 AM 01/06/2023    4:00 AM  Last 3 Weights  Weight (lbs) 197 lb 12 oz 195 lb 8.8 oz 207 lb 7.3 oz  Weight (kg) 89.7 kg 88.7 kg 94.1 kg      Telemetry    Afib low 100s - Personally Reviewed  ECG    N/a - Personally Reviewed  Physical Exam   Lethargic Laying flat Diffuse maculopapular rash No murmur Plus 2-3 edema persists to thighs  Labs    High Sensitivity Troponin:   Recent Labs  Lab 12/30/22 0001 12/30/22 0239   TROPONINIHS 39* 34*     Chemistry Recent Labs  Lab 01/02/23 0857 01/03/23 0516 01/04/23 0320 01/06/23 0448 01/07/23 0409 01/08/23 0303  NA  --    < > 138  137 131* 129* 130*  K  --    < > 3.9  3.9 3.1* 3.8 3.5  CL  --    < > 98  99 85* 82* 83*  CO2  --    < > 31  31 35* 38* 38*  GLUCOSE  --    < > 125*  125* 118* 113* 107*  BUN  --    < > 16  16 12 11 11   CREATININE  --    < > 1.07  1.10 1.09 0.88 0.89  CALCIUM  --    < > 8.5*  8.4* 8.6* 8.7* 8.4*  MG 1.7  --  1.8  --  1.4*  --   PROT  --   --   --   --   --  6.2*  ALBUMIN   --    < > 2.4* 2.4* 2.4* 2.6*  AST  --   --   --   --   --  14*  ALT  --   --   --   --   --  10  ALKPHOS  --   --   --   --   --  48  BILITOT  --   --   --   --   --  1.2  GFRNONAA  --    < > >60  >60 >60 >60 >60  ANIONGAP  --    < > 9  7 11 9 9    < > = values in this interval not displayed.    Lipids No results for input(s): CHOL, TRIG, HDL, LABVLDL, LDLCALC, CHOLHDL in the last 168 hours.  Hematology Recent Labs  Lab 01/06/23 0448 01/07/23 0409 01/08/23 0303  WBC 6.9 8.2 7.7  RBC 2.62* 2.81* 2.57*  HGB 9.2* 9.8* 9.0*  HCT 27.3* 28.9* 26.2*  MCV 104.2* 102.8* 101.9*  MCH 35.1* 34.9* 35.0*  MCHC 33.7 33.9 34.4  RDW 18.6* 18.3* 18.4*  PLT 193 257 154   Thyroid No results for input(s): TSH, FREET4 in the last 168 hours.  BNP Recent Labs  Lab 01/08/23 0303  BNP 809.0*    DDimer  No results for input(s): DDIMER in the last 168 hours.    Radiology    No results found.  Cardiac Studies      Assessment & Plan    1.New onset afib with RVR - new diagnosis this admission, presented with RVR in setting of EtOH intoxication - initially on dilt gtt, rates somewhat difficult to manage due to soft bp's - Prior to admission taking no meds and drinking hard core for last 15 years  -currently on digoxin  0.25mg  daily, lopressor   - On heparin  gtt in short term. Hgb has been stable 26.2  -  Given ongoing issues  with low BP discussed with family using amiodarone  For rate control as well despite small risk of chemical cardioversion without anticoagulation     2.Acute HFpEF - 12/2022 echo LVEF 55-60%, no WMAs, indet diastolic, RV not well visualized, severe LAE LAVI 55 - BNP up to 549, CXR with CHF. Coox ok at 73.1 percent sat - had significant leg and scrotal edema - continue lasix  with albumin  support for BP   Using midodrine  and albumin  to support BP      3. Anemia with positive FOBT - per GI and primary team - can transfuse a unit if BP needs support  - EGD / colon on hold due to hemodynamics    4. EtoH abuse  makes chronic anticoagulation untenable will need  To be d/c to rehab   5.  Rash:  diffuse maculopapular rash new ? From albumin  infusion per primary service       For questions or updates, please contact [facility] Please consult [email or web address] for contact info under        Signed, Maude Emmer, MD  01/08/2023, 8:34 AM

## 2023-01-08 NOTE — Progress Notes (Signed)
 01/07/2023 at 2011: Dr. Keturah notified that patient remains hypotensive 73/48 (54) with HR in the low 100s with metoprolol  due. Okay to hold metoprolol  that's due at 2200. Requested BPs in other arm. Unable to do d/t PICC line in place. Requested that I get one on a lower extremity.   Blood Pressures:  LLE: 102/57 (70) Manual on Left Arm: 92/55  01/07/2023 Dr. Keturah at bedside around 2030. Rash noted on bilateral thighs and groin.   01/08/2023 at 0040: Patient scratching. Rash noted to have spread from earlier. Rash now present on chest and abdomen.  Lotion applied per patient request. Ice packs placed for symptomatic management. Dr. Keturah notified. Okay to continue albumin  since rash was present before initiating albumin . Don't use CHG wipes to determine if that's the culprit. Will add to allergy list and report. Picture requested. These were taken and uploaded to chart.   01/08/2023 at 0215: Dr. Keturah notified that despite albumin  being completed pt remains hypotensive with BP of 83/53 (63) and HR in the 100-115 range. Midodrine  increased and labs ordered.   01/08/2023 at 0507: Dr. Keturah notified that labs are back.

## 2023-01-09 ENCOUNTER — Telehealth: Payer: Self-pay | Admitting: Internal Medicine

## 2023-01-09 LAB — ACTH STIMULATION, 3 TIME POINTS
Cortisol, 30 Min: 18.4 ug/dL
Cortisol, 60 Min: 25.4 ug/dL
Cortisol, Base: 4.3 ug/dL

## 2023-01-09 LAB — BASIC METABOLIC PANEL
Anion gap: 9 (ref 5–15)
BUN: 16 mg/dL (ref 8–23)
CO2: 39 mmol/L — ABNORMAL HIGH (ref 22–32)
Calcium: 8.9 mg/dL (ref 8.9–10.3)
Chloride: 85 mmol/L — ABNORMAL LOW (ref 98–111)
Creatinine, Ser: 1.1 mg/dL (ref 0.61–1.24)
GFR, Estimated: >60 mL/min (ref >60)
Glucose, Bld: 138 mg/dL — ABNORMAL HIGH (ref 70–99)
Potassium: 3.4 mmol/L — ABNORMAL LOW (ref 3.5–5.1)
Sodium: 133 mmol/L — ABNORMAL LOW (ref 135–145)

## 2023-01-09 LAB — CULTURE, BLOOD (ROUTINE X 2)
Culture: NO GROWTH
Culture: NO GROWTH
Special Requests: ADEQUATE
Special Requests: ADEQUATE

## 2023-01-09 LAB — CBC
HCT: 26.7 % — ABNORMAL LOW (ref 39.0–52.0)
Hemoglobin: 8.9 g/dL — ABNORMAL LOW (ref 13.0–17.0)
MCH: 34.6 pg — ABNORMAL HIGH (ref 26.0–34.0)
MCHC: 33.3 g/dL (ref 30.0–36.0)
MCV: 103.9 fL — ABNORMAL HIGH (ref 80.0–100.0)
Platelets: 356 10*3/uL (ref 150–400)
RBC: 2.57 MIL/uL — ABNORMAL LOW (ref 4.22–5.81)
RDW: 18.5 % — ABNORMAL HIGH (ref 11.5–15.5)
WBC: 9.1 10*3/uL (ref 4.0–10.5)
nRBC: 0 % (ref 0.0–0.2)

## 2023-01-09 LAB — HEPARIN LEVEL (UNFRACTIONATED): Heparin Unfractionated: <0.1 [IU]/mL — ABNORMAL LOW (ref 0.30–0.70)

## 2023-01-09 LAB — LACTIC ACID, PLASMA: Lactic Acid, Venous: 1.1 mmol/L (ref 0.5–1.9)

## 2023-01-09 MED ORDER — APIXABAN 5 MG PO TABS
5.0000 mg | ORAL_TABLET | Freq: Two times a day (BID) | ORAL | Status: DC
  Administered 2023-01-09 – 2023-01-16 (×15): 5 mg via ORAL
  Filled 2023-01-09 (×15): qty 1

## 2023-01-09 MED ORDER — POTASSIUM CHLORIDE CRYS ER 20 MEQ PO TBCR
40.0000 meq | EXTENDED_RELEASE_TABLET | Freq: Once | ORAL | Status: AC
  Administered 2023-01-09: 40 meq via ORAL
  Filled 2023-01-09: qty 2

## 2023-01-09 NOTE — Telephone Encounter (Signed)
 Please arrange hospital fu visit for this patient in 2-3 weeks with Dr. Tasia Catchings or an APP. Thank you!

## 2023-01-09 NOTE — Progress Notes (Signed)
 PROGRESS NOTE   Patient: Justin Fleming                            PCP: Patient, No Pcp Per                    DOB: 1960/08/20            DOA: 12/29/2022 FMW:[id number]             DOS: 01/09/2023, 12:52 PM   LOS: 10 days   Date of Service: The patient was seen and examined on 01/09/2023  Subjective:   The patient was seen and examined this morning, awake alert oriented no acute distress, laying in bed comfortably. Cooperative, mildly hypotensive otherwise stable Tolerated EGD and colonoscopy yesterday  Family present at bedside reported to the family that there is no significant site of bleeding Per GI patient has been cleared to start anticoagulation    Brief Narrative:   History limited due to patient's intoxication/AMS, obtained by patient, chart review, EDP Joshual Terrio Sypher is a 63 y.o. male with hx of alcohol abuse, hypertension, mood disorder, smoking, GERD, who was brought in after bystander contacted EMS because he was sitting in his car in the parking lot.   Found have alcohol intoxication, A-fib with RVR, suspected aspiration pneumonia, and worsening lactic acidosis with fluids.  Clinically suspect decompensated heart failure.  Denies any known history of A-fib.  EKG:  A-fib with RVR rate 159, LAD, LAFB, inferior Q's, poor R wave progression, T wave inversion laterally, no overt ischemic changes.    ED Course:  Found to be in A-fib with RVR, initially treated with metoprolol  2.5 mg IV x 1 with slight worsening of pressures to systolic in the 90s.  Started on heparin  for anticoagulation.  Treated with ceftriaxone /azithromycin , 2 L IV fluid, potassium 40 mg IV and mag 4 g.  At my request consulted with cardiology fellow re: rate control options.  Cards recommending possibly metoprolol  IV prn or digoxin , recommend against amiodarone .   ================================================================================   Assessment/Plan:  1)HFpEF --acute diastolic  dysfunction CHF-echo as noted below #2 -Continue to improve clinically  -POA: Clinically appears volume overloaded--+ve JVD, significant peripheral edema including edema of the genitalia -Chest x-ray on admission on 12/30/2022   and repeat chest x-ray from 01/03/2023 showed pulmonary venous congestion and right sided pleural effusion -CTA on 12/31/2022 with bilateral pleural effusions -Daily weights, fluid input and output monitoring as ordered REDs Vest  01/07/23 -Dyspnea improving -Diuresing well--negative fluid balance -Cardiology following -Lasix  on hold due to BP concerns -Midodrine  added for BP support -Voiding well--- I's and O's not acute due to difficulty with male condom catheter  01/08/2023 Mildly hypotensive, Lasix  and metoprolol  were held, 250 of normal saline bolus given -EGD:  -tolerated well, gastritis, essentially unremarkable, no signs of bleeding or varices   01/09/2023 -Tolerated EGD on 01/08/2023, recommending follow-up with gastroenterologist at colonoscopy as an outpatient -Hemoglobin down from 9.0-8.9 no signs of bleeding   2)A-fib with RVR -new onset -New onset  in the setting of alcohol intoxication -CHA2DS2-VASc is at least 1 (HTN) -Echo from 12/31/2022 with EF of 55 to 60%, bilateral atrial enlargement noted, no significant valvular abnormalities --TSH -normal at 2.7 - Potassium and magnesium  WNL -Discontinued heparin , per GI okay to initiate Eliquis  -initiated 01/09/2023 -Rate control remains challenging -Continue IV Cardizem  for rate control--as BP allows -Cardiologist -initiated amiodarone  200 mg p.o. twice daily, digoxin   0.25 milligram daily  -Not a candidate for cardioversion as to be difficult to commit to long-term anticoagulation post cardioversion in the setting of GI bleed without endoluminal evaluation  3)Acute hypoxic respiratory failure--- due to #1 and  #2 above -Clinically and radiologically no significant PNA---- -No leukocytosis, No fevers,  PCT Negative -Lactic acidosis improved with hydration -Blood cultures NGTD -Patient was initially treated with Rocephin -- -No Further antibiotics indicated -Hypoxia resolved---currently on room air -Frank Sepsis ruled out  4)Acute metabolic encephalopathy-----due to above -CT Head with mild diffuse atrophy, no acute abnormalities.   --Intermittently confused and disoriented-stable -Mentation currently stable  5)Acute myocardial injury/demand ischemia in the setting of A-fib with RVR -Ischemic demand due to A-fib with RVR -Troponin 39 >> 34, -  EKG is A-fib with RVR, nonspecific changes without overt ischemic changes. -Management of alcohol withdrawal, A-fib RVR, heart failure, borderline hypotension, aspiration pneumonia per above. -Echo from 12/31/2022 with preserved EF, without wall motion abnormalities -Appreciate cardiology recommendations   6)-acute anemia with heme positive stool--- ??  Upper GI bleed in an alcoholic male--- anemia workup -  -Appears macrocytic and hyperchromic - Folate 3.6, B12 -158----B12 and folic acid  replacement initiated -- Iron  13, saturation 7% --- iron  supplementation ordered Hgb trended down from 11.5 on admission     Latest Ref Rng & Units 01/09/2023    9:17 AM 01/08/2023    3:03 AM 01/07/2023    4:09 AM  CBC  WBC 4.0 - 10.5 K/uL 9.1  7.7  8.2   Hemoglobin 13.0 - 17.0 g/dL 8.9  9.0  9.8   Hematocrit 39.0 - 52.0 % 26.7  26.2  28.9   Platelets 150 - 400 K/uL 356  154  257     -Continue IV PPI -Status post EGD-unremarkable, possible gastritis no signs of bleeding -Patient is noncompliant with colonoscopy prep (last colonoscopy was in 2016, no prior EGD) -Will follow-up as an outpatient for colonoscopy  7)AKI----acute kidney injury---due to dehydration -  creatinine on admission= 1.31 , - baseline creatinine =1.0     -Creatinine has normalized  -renally adjust medications, avoid nephrotoxic agents / dehydration  /  hypotension  8)Social/Ethics--- prior to admission lived alone, admits to heavy alcohol abuse -Patient Sister [name] is primary contact -Discussed with sisters ---[name] and [name] as well as [name] and [name]--   9)Alcohol intoxication/acute metabolic encephalopathy -Reports drinking up to 1.75 L of liquor a day --Patient completed Librium  taper,  , continue folic acid  thiamine  and multivitamin  --intermittently confused, with episodes of disorientation and forgetfulness -Refusing medications----intermittently uncooperative and noncompliant   10)Fatty Liver with mild ascites--- in the setting of chronic alcohol abuse -Right upper quadrant ultrasound from 01/04/2023 noted -Supportive management -Diuretics as above  11)Asymptomatic cholelithiasis----right upper quadrant ultrasound shows.. Contracted gallbladder with stones and sludge. No ductal dilatation.    12) diffuse pruritic rash -Possibly due to chlorhexidine  wash -As needed Benadryl  IV steroid given -Will monitor   13) Capacity evaluation----per Dr. Rendall Sinner: Justin Fleming is  a  63 y.o.  who is AAO x 3 without significant depressive symptoms and without delusion/psychosis who is unable to understand (without significant language barrier) his current medical diagnosis, patient also unable to accurately verbalizes understanding of proposed treatment options including option of no treatment, the consequences/risk versus benefit of each treatment option, alternatives as well as the option of no treatment. -- Based on my evaluation Justin Fleming  appears to Not have the Capacity to make decisions  or give informed consent about his medical care.  A surrogate decision-maker is required as Justin Fleming  appears to Not have capacity to make his own decisions and give informed consent regarding his medical care ---Patient remains forgetful and intermittently confused Discussed with sisters ---[name] and [name] as well as [name] and [name]--  at bedside--- they have offered to make medical decisions for patient  ----------------------------------------------------------------------------------------------------------------------------------------------- Nutritional status:  The patient's BMI is: Body mass index is 28.6 kg/m. I agree with the assessment and plan as outlined below: Nutrition Status:  DVT prophylaxis:  Heparin  drip apixaban  (ELIQUIS ) tablet 5 mg  Code Status:   Code Status: Full Code  Family Communication: Discussed with sisters ---[name] and [name] as well as [name] and [name]--  Admission status:    Inpatient  Disposition: From  - home             Planning for discharge to SNF rehab Vs Home with [facility] Procedures:   Antimicrobials:  Anti-infectives (From admission, onward)    Start     Dose/Rate Route Frequency Ordered Stop   12/31/22 0000  cefTRIAXone  (ROCEPHIN ) 2 g in sodium chloride  0.9 % 100 mL IVPB        2 g 200 mL/hr over 30 Minutes Intravenous Every 24 hours 12/30/22 0443 01/02/23 0013   12/30/22 2200  azithromycin  (ZITHROMAX ) tablet 500 mg        500 mg Oral Daily at bedtime 12/30/22 0443 12/31/22 2112   12/30/22 0000  cefTRIAXone  (ROCEPHIN ) 2 g in sodium chloride  0.9 % 100 mL IVPB        2 g 200 mL/hr over 30 Minutes Intravenous  Once 12/29/22 2345 12/30/22 0100   12/30/22 0000  azithromycin  (ZITHROMAX ) 500 mg in sodium chloride  0.9 % 250 mL IVPB        500 mg 250 mL/hr over 60 Minutes Intravenous  Once 12/29/22 2345 12/30/22 0158      Medication:   amiodarone   200 mg Oral BID   apixaban   5 mg Oral BID   cyanocobalamin   1,000 mcg Intramuscular Once   vitamin B-12  1,000 mcg Oral Daily   digoxin   0.25 mg Oral Daily   ferrous sulfate   325 mg Oral Q breakfast   folic acid   1 mg Oral Daily   furosemide   40 mg Intravenous Once   midodrine   15 mg Oral TID WC   multivitamin with minerals  1 tablet Oral  Daily   naltrexone   25 mg Oral Daily   pantoprazole  (PROTONIX ) IV  40 mg Intravenous Q12H   polyethylene glycol  17 g Oral Daily   sodium chloride  flush  10-40 mL Intracatheter Q12H   sodium chloride  flush  3 mL Intravenous Q12H   sucralfate   1 g Oral TID WC & HS   thiamine   100 mg Oral Daily   acetaminophen , diphenhydrAMINE , HYDROmorphone  (DILAUDID ) injection, mouth rinse, oxyCODONE , sodium chloride  flush  Objective:   Vitals:   01/09/23 1145 01/09/23 1200 01/09/23 1209 01/09/23 1213  BP:  97/61    Pulse: (!) 116 (!) 115 99 (!) 119  Resp: 19 13 16 15   Temp:  98.4 F (36.9 C)    TempSrc:  Axillary    SpO2: 100% 99% 97% 93%  Weight:      Height:        Intake/Output Summary (Last 24 hours) at 01/09/2023 1252 Last data filed at 01/09/2023 0934 Gross per 24 hour  Intake  385.2 ml  Output 550 ml  Net -164.8 ml   Filed Weights   01/08/23 0700 01/08/23 1142 01/09/23 0500  Weight: 89.7 kg 89.7 kg 90.4 kg    Physical examination:      General:  AAO x 3,  cooperative, no distress;   HEENT:  Normocephalic, PERRL, otherwise with in Normal limits   Neuro:  CNII-XII intact. , normal motor and sensation, reflexes intact   Lungs:   Clear to auscultation BL, Respirations unlabored,  No wheezes / crackles  Cardio:    S1/S2, RRR, No murmure, No Rubs or Gallops   Abdomen:  Soft, non-tender, bowel sounds active all four quadrants, no guarding or peritoneal signs.  Muscular  skeletal:  Limited exam -global generalized weaknesses - in bed, able to move all 4 extremities,   2+ pulses,  symmetric, No pitting edema  Skin:  Dry, warm to touch, negative for any Rashes,   GU-improving penile and Scrotal Edema MSK--Rt arm PICC Line in situ Wounds: Please see nursing documentation  LABs:     Latest Ref Rng & Units 01/09/2023    9:17 AM 01/08/2023    3:03 AM 01/07/2023    4:09 AM  CBC  WBC 4.0 - 10.5 K/uL 9.1  7.7  8.2   Hemoglobin 13.0 - 17.0 g/dL 8.9  9.0  9.8   Hematocrit 39.0 - 52.0 %  26.7  26.2  28.9   Platelets 150 - 400 K/uL 356  154  257       Latest Ref Rng & Units 01/09/2023    6:23 AM 01/08/2023    3:03 AM 01/07/2023    4:09 AM  CMP  Glucose 70 - 99 mg/dL 861  892  886   BUN 8 - 23 mg/dL 16  11  11    Creatinine 0.61 - 1.24 mg/dL 8.89  9.10  9.11   Sodium 135 - 145 mmol/L 133  130  129   Potassium 3.5 - 5.1 mmol/L 3.4  3.5  3.8   Chloride 98 - 111 mmol/L 85  83  82   CO2 22 - 32 mmol/L 39  38  38   Calcium 8.9 - 10.3 mg/dL 8.9  8.4  8.7   Total Protein 6.5 - 8.1 g/dL  6.2    Total Bilirubin 0.0 - 1.2 mg/dL  1.2    Alkaline Phos 38 - 126 U/L  48    AST 15 - 41 U/L  14    ALT 0 - 44 U/L  10     Micro Results Recent Results (from the past 240 hours)  SARS Coronavirus 2 by RT PCR (hospital order, performed in [facility] hospital lab) *cepheid single result test* Anterior Nasal Swab     Status: None   Collection Time: 12/31/22 12:31 PM   Specimen: Anterior Nasal Swab  Result Value Ref Range Status   SARS Coronavirus 2 by RT PCR NEGATIVE NEGATIVE Final    Comment: (NOTE) SARS-CoV-2 target nucleic acids are NOT DETECTED.  The SARS-CoV-2 RNA is generally detectable in upper and lower respiratory specimens during the acute phase of infection. The lowest concentration of SARS-CoV-2 viral copies this assay can detect is 250 copies / mL. A negative result does not preclude SARS-CoV-2 infection and should not be used as the sole basis for treatment or other patient management decisions.  A negative result may occur with improper specimen collection / handling, submission of specimen other than nasopharyngeal swab, presence of viral mutation(s) within the  areas targeted by this assay, and inadequate number of viral copies (<250 copies / mL). A negative result must be combined with clinical observations, patient history, and epidemiological information.  Fact Sheet for Patients:   roadlaptop.co.za  Fact Sheet for Healthcare  Providers: http://kim-miller.com/  This test is not yet approved or  cleared by the [place]  FDA and has been authorized for detection and/or diagnosis of SARS-CoV-2 by FDA under an Emergency Use Authorization (EUA).  This EUA will remain in effect (meaning this test can be used) for the duration of the COVID-19 declaration under Section 564(b)(1) of the Act, 21 U.S.C. section 360bbb-3(b)(1), unless the authorization is terminated or revoked sooner.  Performed at [facility], [address], [place], [place] [address]   Culture, blood (Routine X 2) w Reflex to ID Panel     Status: None   Collection Time: 01/04/23  4:19 PM   Specimen: BLOOD LEFT HAND  Result Value Ref Range Status   Specimen Description BLOOD LEFT HAND  Final   Special Requests   Final    BOTTLES DRAWN AEROBIC AND ANAEROBIC Blood Culture adequate volume   Culture   Final    NO GROWTH 5 DAYS Performed at [facility], [address], [place], [place] [address]    Report Status 01/09/2023 FINAL  Final  Culture, blood (Routine X 2) w Reflex to ID Panel     Status: None   Collection Time: 01/04/23  4:19 PM   Specimen: BLOOD LEFT WRIST  Result Value Ref Range Status   Specimen Description BLOOD LEFT WRIST  Final   Special Requests   Final    BOTTLES DRAWN AEROBIC AND ANAEROBIC Blood Culture adequate volume   Culture   Final    NO GROWTH 5 DAYS Performed at [facility], [address] [place], [place] [address]    Report Status 01/09/2023 FINAL  Final   SIGNED: Adriana DELENA Grams, MD,  - [facility] Critical care time spent 55 minutes [facility],  Pager (please use [email or web address] to page/ text) Please use Epic Secure Chat for non-urgent communication (7AM-7PM)  If 7PM-7AM, please contact night-coverage [email or web address], 01/09/2023, 12:52 PM

## 2023-01-09 NOTE — Progress Notes (Signed)
 Patient's Hgb stable. EGD 01/08/23 with mild gastritis, otherwise unremarkable. No active or stigmata of bleeding. Okay to continue systemic anticoagulation from GI standpoint. Needs outpatient colonoscopy. We will arrange follow up visit. Okay to switch PPI to PO. GI to sign off, please contact if has evidence of GI bleeding. Family updated.

## 2023-01-09 NOTE — Plan of Care (Signed)
  Problem: Education: Goal: Knowledge of General Education information will improve Description: Including pain rating scale, medication(s)/side effects and non-pharmacologic comfort measures Outcome: Progressing   Problem: Clinical Measurements: Goal: Will remain free from infection Outcome: Progressing Goal: Diagnostic test results will improve Outcome: Progressing Goal: Respiratory complications will improve Outcome: Progressing   Problem: Coping: Goal: Level of anxiety will decrease Outcome: Progressing   Problem: Elimination: Goal: Will not experience complications related to bowel motility Outcome: Progressing Goal: Will not experience complications related to urinary retention Outcome: Progressing   Problem: Pain Management: Goal: General experience of comfort will improve Outcome: Progressing   Problem: Safety: Goal: Ability to remain free from injury will improve Outcome: Progressing

## 2023-01-09 NOTE — TOC Progression Note (Signed)
 Transition of Care [facility]) - Progression Note    Patient Details  Name: Justin Fleming MRN: [id number] Date of Birth: 05/22/1960  [facility]) CM/SW Contact  Lorraine LILLETTE Fenton, LCSW Phone Number: 01/09/2023, 2:53 PM  Clinical Narrative:     CSW spoke to sister who stated that she was able to speak with Dr. Today and aware pt is in hospital. Sister reports pt is unmarried, lives alone and had been driving and independent.  Pt does not have children, he does have sisters who reside in the area. TOC to follow.     Barriers to Discharge: Continued Medical Work up  Expected Discharge Plan and Services       Living arrangements for the past 2 months: Single Family Home                                       Social Determinants of Health (SDOH) Interventions SDOH Screenings   Food Insecurity: No Food Insecurity (12/30/2022)  Housing: Low Risk  (12/30/2022)  Transportation Needs: No Transportation Needs (12/30/2022)  Utilities: Not At Risk (12/30/2022)  Alcohol Screen: Low Risk  (02/02/2018)  Depression (PHQ2-9): Low Risk  (02/02/2018)  Tobacco Use: Medium Risk (01/08/2023)    Readmission Risk Interventions     No data to display

## 2023-01-09 NOTE — Progress Notes (Signed)
 PHARMACY - ANTICOAGULATION CONSULT NOTE  Pharmacy Consult for heparin  => eliquis  Indication: atrial fibrillation  Allergies  Allergen Reactions   Chlorhexidine  Gluconate Rash    Patient Measurements: Height: 5' 10 (177.8 cm) Weight: 90.4 kg (199 lb 4.7 oz) IBW/kg (Calculated) : 73 Heparin  Dosing Weight: 93 kg  Vital Signs: Temp: 97.7 F (36.5 C) (01/04 0530) BP: 88/64 (01/04 0630) Pulse Rate: 80 (01/04 0630)  Labs: Recent Labs    01/07/23 0409 01/08/23 0303 01/09/23 0623  HGB 9.8* 9.0*  --   HCT 28.9* 26.2*  --   PLT 257 154  --   HEPARINUNFRC 0.35 0.11* <0.10*  CREATININE 0.88 0.89 1.10    Estimated Creatinine Clearance: 76.8 mL/min (by C-G formula based on SCr of 1.1 mg/dL).  Assessment: Justin Fleming is a 63 y.o. year old male admitted on 12/29/2022 with concern for new onset afib with RVR. Currently on diltiazem  drip for rate control. No anticoagulation prior to admission. Pharmacy initially consulted to transition from heparin  to apixaban , but then relented with drop in Hgb and the possibility of additional intervention. Therefore, patient back on heparin . He had received one dose of Eliquis . CHA2DS2VASc is at least 2 (HTN, HFpEF).  EGD completed 01/07/22, okay to restart eliquis  for afib.  Goal of Therapy:  Heparin  level 0.3-0.7 units/ml Monitor platelets by anticoagulation protocol: Yes  Plan: Eliquis  5mg  po bid Educate on eliquis  Monitor for S/S of bleeding  Cherlyn Boers, BS Pharm D, BCPS Clinical Pharmacist 01/09/2023 7:34 AM

## 2023-01-10 LAB — MAGNESIUM: Magnesium: 2 mg/dL (ref 1.7–2.4)

## 2023-01-10 LAB — BASIC METABOLIC PANEL
Anion gap: 10 (ref 5–15)
BUN: 16 mg/dL (ref 8–23)
CO2: 39 mmol/L — ABNORMAL HIGH (ref 22–32)
Calcium: 9.2 mg/dL (ref 8.9–10.3)
Chloride: 86 mmol/L — ABNORMAL LOW (ref 98–111)
Creatinine, Ser: 1.17 mg/dL (ref 0.61–1.24)
GFR, Estimated: >60 mL/min (ref >60)
Glucose, Bld: 112 mg/dL — ABNORMAL HIGH (ref 70–99)
Potassium: 3.4 mmol/L — ABNORMAL LOW (ref 3.5–5.1)
Sodium: 135 mmol/L (ref 135–145)

## 2023-01-10 LAB — CBC
HCT: 27.2 % — ABNORMAL LOW (ref 39.0–52.0)
Hemoglobin: 8.7 g/dL — ABNORMAL LOW (ref 13.0–17.0)
MCH: 33.2 pg (ref 26.0–34.0)
MCHC: 32 g/dL (ref 30.0–36.0)
MCV: 103.8 fL — ABNORMAL HIGH (ref 80.0–100.0)
Platelets: 378 10*3/uL (ref 150–400)
RBC: 2.62 MIL/uL — ABNORMAL LOW (ref 4.22–5.81)
RDW: 18.9 % — ABNORMAL HIGH (ref 11.5–15.5)
WBC: 9.8 10*3/uL (ref 4.0–10.5)
nRBC: 0 % (ref 0.0–0.2)

## 2023-01-10 MED ORDER — DOCUSATE SODIUM 100 MG PO CAPS
200.0000 mg | ORAL_CAPSULE | Freq: Every day | ORAL | Status: DC
  Administered 2023-01-10 – 2023-01-15 (×5): 200 mg via ORAL
  Filled 2023-01-10 (×6): qty 2

## 2023-01-10 MED ORDER — FUROSEMIDE 40 MG PO TABS
40.0000 mg | ORAL_TABLET | Freq: Every day | ORAL | Status: DC
  Administered 2023-01-10 – 2023-01-16 (×7): 40 mg via ORAL
  Filled 2023-01-10 (×7): qty 1

## 2023-01-10 MED ORDER — FERROUS SULFATE 325 (65 FE) MG PO TABS
325.0000 mg | ORAL_TABLET | Freq: Two times a day (BID) | ORAL | Status: DC
  Administered 2023-01-10 – 2023-01-16 (×14): 325 mg via ORAL
  Filled 2023-01-10 (×13): qty 1

## 2023-01-10 MED ORDER — POTASSIUM CHLORIDE CRYS ER 20 MEQ PO TBCR
40.0000 meq | EXTENDED_RELEASE_TABLET | Freq: Once | ORAL | Status: AC
  Administered 2023-01-10: 40 meq via ORAL
  Filled 2023-01-10: qty 2

## 2023-01-10 NOTE — Plan of Care (Signed)

## 2023-01-10 NOTE — Progress Notes (Signed)
 PROGRESS NOTE   Patient: Justin Fleming                            PCP: Patient, No Pcp Per                    DOB: June 14, 1960            DOA: 12/29/2022 FMW:[id number]             DOS: 01/10/2023, 2:22 PM   LOS: 11 days   Date of Service: The patient was seen and examined on 01/10/2023  Subjective:   The patient was seen and examined this morning, complain generalized weaknesses, hemodynamically stable. No issues overnight    Brief Narrative:   History limited due to patient's intoxication/AMS, obtained by patient, chart review, EDP Demar Shad Vara is a 63 y.o. male with hx of alcohol abuse, hypertension, mood disorder, smoking, GERD, who was brought in after bystander contacted EMS because he was sitting in his car in the parking lot.   Found have alcohol intoxication, A-fib with RVR, suspected aspiration pneumonia, and worsening lactic acidosis with fluids.  Clinically suspect decompensated heart failure.  Denies any known history of A-fib.  EKG:  A-fib with RVR rate 159, LAD, LAFB, inferior Q's, poor R wave progression, T wave inversion laterally, no overt ischemic changes.    ED Course:  Found to be in A-fib with RVR, initially treated with metoprolol  2.5 mg IV x 1 with slight worsening of pressures to systolic in the 90s.  Started on heparin  for anticoagulation.  Treated with ceftriaxone /azithromycin , 2 L IV fluid, potassium 40 mg IV and mag 4 g.  At my request consulted with cardiology fellow re: rate control options.  Cards recommending possibly metoprolol  IV prn or digoxin , recommend against amiodarone .   ================================================================================   Assessment/Plan:  1)HFpEF --acute diastolic dysfunction CHF-echo as noted below #2 -Continue to improve clinically  -POA: Clinically appears volume overloaded--+ve JVD, significant peripheral edema including edema of the genitalia -Chest x-ray on admission on 12/30/2022   and repeat  chest x-ray from 01/03/2023 showed pulmonary venous congestion and right sided pleural effusion -CTA on 12/31/2022 with bilateral pleural effusions -Daily weights, fluid input and output monitoring as ordered REDs Vest  01/07/23 -Dyspnea improving -Diuresing well--negative fluid balance -Cardiology following -Lasix  on hold due to BP concerns -Midodrine  added for BP support -Voiding well--- I's and O's not acute due to difficulty with male condom catheter  01/08/2023 Mildly hypotensive, Lasix  and metoprolol  were held, 250 of normal saline bolus given -EGD:  -tolerated well, gastritis, essentially unremarkable, no signs of bleeding or varices   01/09/2023 -Tolerated EGD on 01/08/2023, recommending follow-up with gastroenterologist at colonoscopy as an outpatient -Hemoglobin down from 9.0-8.9 no signs of bleeding  01/10/2023 -The patient was seen and examined, stable.  Heart rate remained stable Blood pressure has improved on midodrine  Continue Eliquis      2)A-fib with RVR -new onset -New onset  in the setting of alcohol intoxication -CHA2DS2-VASc is at least 1 (HTN) -Echo from 12/31/2022 with EF of 55 to 60%, bilateral atrial enlargement noted, no significant valvular abnormalities --TSH -normal at 2.7 - Potassium and magnesium  WNL -Discontinued heparin , per GI okay to initiate Eliquis  -initiated 01/09/2023 -Rate control remains challenging -Discontinued Cardizem  due to soft BP -Cardiologist -initiated amiodarone  200 mg p.o. twice daily, digoxin  0.25 milligram daily  -Not a candidate for cardioversion as to be difficult to commit to  long-term anticoagulation post cardioversion in the setting of GI bleed without endoluminal evaluation  3)Acute hypoxic respiratory failure--- due to #1 and  #2 above -Clinically and radiologically no significant PNA---- -No leukocytosis, No fevers, PCT Negative -Lactic acidosis improved with hydration -Blood cultures NGTD -Patient was initially treated  with Rocephin -- -No Further antibiotics indicated -Hypoxia resolved---currently on room air -Frank Sepsis ruled out  4)Acute metabolic encephalopathy-----due to above -CT Head with mild diffuse atrophy, no acute abnormalities.   -Mentation back to baseline  5)Acute myocardial injury/demand ischemia in the setting of A-fib with RVR -Ischemic demand due to A-fib with RVR -Troponin 39 >> 34, -  EKG is A-fib with RVR, nonspecific changes without overt ischemic changes. -Management of alcohol withdrawal, A-fib RVR, heart failure, borderline hypotension, aspiration pneumonia per above. -Echo from 12/31/2022 with preserved EF, without wall motion abnormalities -Appreciate cardiology recommendations   6)-acute anemia with heme positive stool---  workup -EGD consistent with gastritis no signs of bleeding -Appears macrocytic and hyperchromic - Folate 3.6, B12 -158----B12 and folic acid  replacement initiated -- Iron  13, saturation 7% --- iron  supplementation ordered Hgb trended down from 11.5 on admission     Latest Ref Rng & Units 01/10/2023    6:06 AM 01/09/2023    9:17 AM 01/08/2023    3:03 AM  CBC  WBC 4.0 - 10.5 K/uL 9.8  9.1  7.7   Hemoglobin 13.0 - 17.0 g/dL 8.7  8.9  9.0   Hematocrit 39.0 - 52.0 % 27.2  26.7  26.2   Platelets 150 - 400 K/uL 378  356  154     -Continue IV PPI -Status post EGD-unremarkable, possible gastritis no signs of bleeding -Patient is noncompliant with colonoscopy prep (last colonoscopy was in 2016, no prior EGD) -Will follow-up as an outpatient for colonoscopy  7)AKI----acute kidney injury---due to dehydration -  creatinine on admission= 1.31 , - baseline creatinine =1.0     -Improved with hydration  8)Social/Ethics--- prior to admission lived alone, admits to heavy alcohol abuse -Patient Sister Justin Fleming is primary contact CODE STATUS, plan of care, disposition currently being discussed with family  9)Alcohol intoxication/acute metabolic  encephalopathy -Reports drinking up to 1.75 L of liquor a day --Patient completed Librium  taper,  , continue folic acid  thiamine  and multivitamin  --intermittently confused,   10)Fatty Liver with mild ascites--- in the setting of chronic alcohol abuse -Right upper quadrant ultrasound from 01/04/2023 noted -Supportive management -Diuretics as above  11)Asymptomatic cholelithiasis-- -remained asymptomatic --right upper quadrant ultrasound shows.. Contracted gallbladder with stones and sludge. No ductal dilatation.    12) diffuse pruritic rash -Improved -Possibly due to chlorhexidine  wash -As needed Benadryl  IV steroid given x1  -Will monitor   13) Capacity evaluation----per Dr. Rendall Sinner: Justin Fleming is  a  63 y.o.  who is AAO x 3 without significant depressive symptoms and without delusion/psychosis who is unable to understand (without significant language barrier) his current medical diagnosis, patient also unable to accurately verbalizes understanding of proposed treatment options including option of no treatment, the consequences/risk versus benefit of each treatment option, alternatives as well as the option of no treatment. -- Based on my evaluation Justin Fleming  appears to Not have the Capacity to make decisions or give informed consent about his medical care.  A surrogate decision-maker is required as Justin Fleming  appears to Not have capacity to make his own decisions and give informed consent regarding his medical care ---Patient remains forgetful and intermittently confused Discussed  with sisters ---Justin Fleming and Justin Fleming as well as Justin Fleming and Niece-Justin Fleming--  at bedside--- they have offered to make medical decisions for patient  ----------------------------------------------------------------------------------------------------------------------------------------------- Nutritional status:  The patient's BMI is: Body mass index is 28.5 kg/m. I  agree with the assessment and plan as outlined below: Nutrition Status:  DVT prophylaxis:  Heparin  drip apixaban  (ELIQUIS ) tablet 5 mg  Code Status:   Code Status: Full Code  Family Communication: Discussed with sisters ---Justin Fleming and Justin Fleming as well as Justin Fleming and Niece-Justin Fleming--  Admission status:    Inpatient  Disposition: From  - home             Planning for discharge to SNF rehab [facility]s Home with [facility] Procedures:   Antimicrobials:  Anti-infectives (From admission, onward)    Start     Dose/Rate Route Frequency Ordered Stop   12/31/22 0000  cefTRIAXone  (ROCEPHIN ) 2 g in sodium chloride  0.9 % 100 mL IVPB        2 g 200 mL/hr over 30 Minutes Intravenous Every 24 hours 12/30/22 0443 01/02/23 0013   12/30/22 2200  azithromycin  (ZITHROMAX ) tablet 500 mg        500 mg Oral Daily at bedtime 12/30/22 0443 12/31/22 2112   12/30/22 0000  cefTRIAXone  (ROCEPHIN ) 2 g in sodium chloride  0.9 % 100 mL IVPB        2 g 200 mL/hr over 30 Minutes Intravenous  Once 12/29/22 2345 12/30/22 0100   12/30/22 0000  azithromycin  (ZITHROMAX ) 500 mg in sodium chloride  0.9 % 250 mL IVPB        500 mg 250 mL/hr over 60 Minutes Intravenous  Once 12/29/22 2345 12/30/22 0158      Medication:   amiodarone   200 mg Oral BID   apixaban   5 mg Oral BID   cyanocobalamin   1,000 mcg Intramuscular Once   vitamin B-12  1,000 mcg Oral Daily   digoxin   0.25 mg Oral Daily   docusate sodium   200 mg Oral Daily   ferrous sulfate   325 mg Oral BID WC   folic acid   1 mg Oral Daily   furosemide   40 mg Oral Daily   midodrine   15 mg Oral TID WC   multivitamin with minerals  1 tablet Oral Daily   pantoprazole  (PROTONIX ) IV  40 mg Intravenous Q12H   polyethylene glycol  17 g Oral Daily   sodium chloride  flush  10-40 mL Intracatheter Q12H   sodium chloride  flush  3 mL Intravenous Q12H   sucralfate   1 g Oral TID WC & HS   thiamine   100 mg Oral Daily   acetaminophen , diphenhydrAMINE , HYDROmorphone  (DILAUDID )  injection, mouth rinse, oxyCODONE , sodium chloride  flush  Objective:   Vitals:   01/10/23 1100 01/10/23 1136 01/10/23 1152 01/10/23 1200  BP: 114/72   117/64  Pulse:      Resp: 20  18   Temp:  99 F (37.2 C)    TempSrc:  Oral    SpO2: 100%  94%   Weight:      Height:        Intake/Output Summary (Last 24 hours) at 01/10/2023 1422 Last data filed at 01/10/2023 0741 Gross per 24 hour  Intake 260 ml  Output 450 ml  Net -190 ml   Filed Weights   01/08/23 1142 01/09/23 0500 01/10/23 0500  Weight: 89.7 kg 90.4 kg 90.1 kg    Physical examination:    General:  AAO x 3,  cooperative, no  distress;   HEENT:  Normocephalic, PERRL, otherwise with in Normal limits   Neuro:  CNII-XII intact. , normal motor and sensation, reflexes intact   Lungs:   Clear to auscultation BL, Respirations unlabored,  No wheezes / crackles  Cardio:    S1/S2, RRR, No murmure, No Rubs or Gallops   Abdomen:  Soft, non-tender, bowel sounds active all four quadrants, no guarding or peritoneal signs.  Muscular  skeletal:  Limited exam -global generalized weaknesses - in bed, able to move all 4 extremities,   2+ pulses,  symmetric, No pitting edema  Skin:  Dry, warm to touch, negative for any Rashes,   GU-improving penile and Scrotal Edema MSK--Rt arm PICC Line in situ Wounds: Please see nursing documentation  LABs:     Latest Ref Rng & Units 01/10/2023    6:06 AM 01/09/2023    9:17 AM 01/08/2023    3:03 AM  CBC  WBC 4.0 - 10.5 K/uL 9.8  9.1  7.7   Hemoglobin 13.0 - 17.0 g/dL 8.7  8.9  9.0   Hematocrit 39.0 - 52.0 % 27.2  26.7  26.2   Platelets 150 - 400 K/uL 378  356  154       Latest Ref Rng & Units 01/10/2023    6:06 AM 01/09/2023    6:23 AM 01/08/2023    3:03 AM  CMP  Glucose 70 - 99 mg/dL 887  861  892   BUN 8 - 23 mg/dL 16  16  11    Creatinine 0.61 - 1.24 mg/dL 8.82  8.89  9.10   Sodium 135 - 145 mmol/L 135  133  130   Potassium 3.5 - 5.1 mmol/L 3.4  3.4  3.5   Chloride 98 - 111 mmol/L 86  85  83    CO2 22 - 32 mmol/L 39  39  38   Calcium 8.9 - 10.3 mg/dL 9.2  8.9  8.4   Total Protein 6.5 - 8.1 g/dL   6.2   Total Bilirubin 0.0 - 1.2 mg/dL   1.2   Alkaline Phos 38 - 126 U/L   48   AST 15 - 41 U/L   14   ALT 0 - 44 U/L   10    Micro Results Recent Results (from the past 240 hours)  Culture, blood (Routine X 2) w Reflex to ID Panel     Status: None   Collection Time: 01/04/23  4:19 PM   Specimen: BLOOD LEFT HAND  Result Value Ref Range Status   Specimen Description BLOOD LEFT HAND  Final   Special Requests   Final    BOTTLES DRAWN AEROBIC AND ANAEROBIC Blood Culture adequate volume   Culture   Final    NO GROWTH 5 DAYS Performed at [facility], [address] [place], [place] [address]    Report Status 01/09/2023 FINAL  Final  Culture, blood (Routine X 2) w Reflex to ID Panel     Status: None   Collection Time: 01/04/23  4:19 PM   Specimen: BLOOD LEFT WRIST  Result Value Ref Range Status   Specimen Description BLOOD LEFT WRIST  Final   Special Requests   Final    BOTTLES DRAWN AEROBIC AND ANAEROBIC Blood Culture adequate volume   Culture   Final    NO GROWTH 5 DAYS Performed at [facility], [address], [place], [place] [address]    Report Status 01/09/2023 FINAL  Final   SIGNED: Adriana DELENA Grams,  MD,  - [facility] Critical care time spent 55 minutes [facility],  Pager (please use [email or web address] to page/ text) Please use Epic Secure Chat for non-urgent communication (7AM-7PM)  If 7PM-7AM, please contact night-coverage [email or web address], 01/10/2023, 2:22 PM

## 2023-01-10 NOTE — Plan of Care (Signed)
  Problem: Education: Goal: Knowledge of General Education information will improve Description: Including pain rating scale, medication(s)/side effects and non-pharmacologic comfort measures Outcome: Progressing   Problem: Nutrition: Goal: Adequate nutrition will be maintained Outcome: Progressing   Problem: Coping: Goal: Level of anxiety will decrease Outcome: Progressing   Problem: Pain Management: Goal: General experience of comfort will improve Outcome: Progressing   Problem: Safety: Goal: Ability to remain free from injury will improve Outcome: Progressing

## 2023-01-11 ENCOUNTER — Inpatient Hospital Stay (HOSPITAL_COMMUNITY): Payer: Medicare Other

## 2023-01-11 ENCOUNTER — Other Ambulatory Visit (HOSPITAL_COMMUNITY): Payer: Self-pay

## 2023-01-11 DIAGNOSIS — I4891 Unspecified atrial fibrillation: Secondary | ICD-10-CM | POA: Diagnosis not present

## 2023-01-11 DIAGNOSIS — N289 Disorder of kidney and ureter, unspecified: Secondary | ICD-10-CM

## 2023-01-11 DIAGNOSIS — Z66 Do not resuscitate: Secondary | ICD-10-CM

## 2023-01-11 DIAGNOSIS — J189 Pneumonia, unspecified organism: Secondary | ICD-10-CM

## 2023-01-11 LAB — DIGOXIN LEVEL: Digoxin Level: 2 ng/mL (ref 0.8–2.0)

## 2023-01-11 LAB — CBC
HCT: 26.3 % — ABNORMAL LOW (ref 39.0–52.0)
Hemoglobin: 8.5 g/dL — ABNORMAL LOW (ref 13.0–17.0)
MCH: 34 pg (ref 26.0–34.0)
MCHC: 32.3 g/dL (ref 30.0–36.0)
MCV: 105.2 fL — ABNORMAL HIGH (ref 80.0–100.0)
Platelets: 363 10*3/uL (ref 150–400)
RBC: 2.5 MIL/uL — ABNORMAL LOW (ref 4.22–5.81)
RDW: 18.9 % — ABNORMAL HIGH (ref 11.5–15.5)
WBC: 9.4 10*3/uL (ref 4.0–10.5)
nRBC: 0 % (ref 0.0–0.2)

## 2023-01-11 LAB — BASIC METABOLIC PANEL
Anion gap: 10 (ref 5–15)
BUN: 16 mg/dL (ref 8–23)
CO2: 38 mmol/L — ABNORMAL HIGH (ref 22–32)
Calcium: 9 mg/dL (ref 8.9–10.3)
Chloride: 88 mmol/L — ABNORMAL LOW (ref 98–111)
Creatinine, Ser: 1.1 mg/dL (ref 0.61–1.24)
GFR, Estimated: >60 mL/min (ref >60)
Glucose, Bld: 105 mg/dL — ABNORMAL HIGH (ref 70–99)
Potassium: 3.8 mmol/L (ref 3.5–5.1)
Sodium: 136 mmol/L (ref 135–145)

## 2023-01-11 MED ORDER — PANTOPRAZOLE SODIUM 40 MG IV SOLR: 40.0000 mg | Freq: Two times a day (BID) | INTRAVENOUS | 0 refills | Status: DC

## 2023-01-11 MED ORDER — APIXABAN 5 MG PO TABS: 5.0000 mg | ORAL_TABLET | Freq: Two times a day (BID) | ORAL | 3 refills | Status: DC

## 2023-01-11 MED ORDER — AMIODARONE HCL IN DEXTROSE 360-4.14 MG/200ML-% IV SOLN
60.0000 mg/h | INTRAVENOUS | Status: DC
  Administered 2023-01-11 (×2): 60 mg/h via INTRAVENOUS

## 2023-01-11 MED ORDER — AMIODARONE HCL IN DEXTROSE 360-4.14 MG/200ML-% IV SOLN
30.0000 mg/h | INTRAVENOUS | Status: DC
  Administered 2023-01-12 – 2023-01-14 (×3): 30 mg/h via INTRAVENOUS
  Filled 2023-01-11 (×5): qty 200

## 2023-01-11 MED ORDER — FERROUS SULFATE 325 (65 FE) MG PO TABS: 325.0000 mg | ORAL_TABLET | Freq: Two times a day (BID) | ORAL | 0 refills | Status: DC

## 2023-01-11 MED ORDER — AMIODARONE HCL 200 MG PO TABS: 200.0000 mg | ORAL_TABLET | Freq: Two times a day (BID) | ORAL | 2 refills | Status: DC

## 2023-01-11 MED ORDER — FUROSEMIDE 40 MG PO TABS: 40.0000 mg | ORAL_TABLET | Freq: Every day | ORAL | 1 refills | Status: DC

## 2023-01-11 MED ORDER — AMIODARONE LOAD VIA INFUSION
150.0000 mg | Freq: Once | INTRAVENOUS | Status: AC
  Administered 2023-01-11: 150 mg via INTRAVENOUS
  Filled 2023-01-11: qty 83.34

## 2023-01-11 MED ORDER — ADULT MULTIVITAMIN W/MINERALS CH: 1.0000 | ORAL_TABLET | Freq: Every day | ORAL | 1 refills | Status: DC

## 2023-01-11 MED ORDER — DIGOXIN 250 MCG PO TABS: 0.2500 mg | ORAL_TABLET | Freq: Every day | ORAL | 1 refills | Status: DC

## 2023-01-11 MED ORDER — MIDODRINE HCL 5 MG PO TABS: 15.0000 mg | ORAL_TABLET | Freq: Three times a day (TID) | ORAL | 0 refills | Status: DC

## 2023-01-11 NOTE — TOC Progression Note (Signed)
 Transition of Care [facility]) - Progression Note    Patient Details  Name: Justin Fleming MRN: [id number] Date of Birth: Jul 15, 1960  [facility] CM/SW Contact  Sharlyne Stabs, RN Phone Number: 01/11/2023, 4:57 PM  Clinical Narrative:   Discuss bed offer with patient sister, They want [facility]. TOC following to see when patient will be medically ready to discharge.     Expected Discharge Plan: Skilled Nursing Facility Barriers to Discharge: No SNF bed  Expected Discharge Plan and Services       Living arrangements for the past 2 months: Single Family Home                        Social Determinants of Health (SDOH) Interventions SDOH Screenings   Food Insecurity: No Food Insecurity (12/30/2022)  Housing: Low Risk  (12/30/2022)  Transportation Needs: No Transportation Needs (12/30/2022)  Utilities: Not At Risk (12/30/2022)  Alcohol Screen: Low Risk  (02/02/2018)  Depression (PHQ2-9): Low Risk  (02/02/2018)  Tobacco Use: Medium Risk (01/08/2023)    Readmission Risk Interventions    01/11/2023   12:44 PM  Readmission Risk Prevention Plan  Transportation Screening Complete  PCP or Specialist Appt within 5-7 Days Not Complete  Home Care Screening Complete  Medication Review (RN CM) Complete

## 2023-01-11 NOTE — Progress Notes (Signed)
 Rounding Note    Patient Name: Justin Fleming Date of Encounter: 01/11/2023  [facility] Cardiologist: New  Subjective   No complaints  Inpatient Medications    Scheduled Meds:  amiodarone   200 mg Oral BID   apixaban   5 mg Oral BID   cyanocobalamin   1,000 mcg Intramuscular Once   vitamin B-12  1,000 mcg Oral Daily   digoxin   0.25 mg Oral Daily   docusate sodium   200 mg Oral Daily   ferrous sulfate   325 mg Oral BID WC   folic acid   1 mg Oral Daily   furosemide   40 mg Oral Daily   midodrine   15 mg Oral TID WC   multivitamin with minerals  1 tablet Oral Daily   pantoprazole  (PROTONIX ) IV  40 mg Intravenous Q12H   polyethylene glycol  17 g Oral Daily   sodium chloride  flush  10-40 mL Intracatheter Q12H   sodium chloride  flush  3 mL Intravenous Q12H   sucralfate   1 g Oral TID WC & HS   thiamine   100 mg Oral Daily   Continuous Infusions:  PRN Meds: acetaminophen , diphenhydrAMINE , HYDROmorphone  (DILAUDID ) injection, mouth rinse, oxyCODONE , sodium chloride  flush   Vital Signs    Vitals:   01/11/23 0430 01/11/23 0500 01/11/23 0530 01/11/23 0600  BP: 106/75 105/71 111/65 122/83  Pulse: 96 (!) 112 (!) 102 (!) 103  Resp: 16 15 17 18   Temp:  98.7 F (37.1 C)    TempSrc:  Oral    SpO2: 100% 100% 100% 100%  Weight:  91.3 kg    Height:        Intake/Output Summary (Last 24 hours) at 01/11/2023 0750 Last data filed at 01/11/2023 0500 Gross per 24 hour  Intake --  Output 800 ml  Net -800 ml      01/11/2023    5:00 AM 01/10/2023    5:00 AM 01/09/2023    5:00 AM  Last 3 Weights  Weight (lbs) 201 lb 4.5 oz 198 lb 10.2 oz 199 lb 4.7 oz  Weight (kg) 91.3 kg 90.1 kg 90.4 kg      Telemetry    Afib variable rates - Personally Reviewed  ECG    N/a - Personally Reviewed  Physical Exam   GEN: No acute distress.   Neck: No JVD Cardiac: irreg Respiratory: Clear to auscultation bilaterally. GI: Soft, nontender, non-distended  MS: 1+ bilateral LE edema Neuro:   Nonfocal  Psych: Normal affect   Labs    High Sensitivity Troponin:   Recent Labs  Lab 12/30/22 0001 12/30/22 0239  TROPONINIHS 39* 34*     Chemistry Recent Labs  Lab 01/06/23 0448 01/07/23 0409 01/08/23 0303 01/09/23 0623 01/10/23 0606 01/10/23 0755 01/11/23 0409  NA 131* 129* 130* 133* 135  --  136  K 3.1* 3.8 3.5 3.4* 3.4*  --  3.8  CL 85* 82* 83* 85* 86*  --  88*  CO2 35* 38* 38* 39* 39*  --  38*  GLUCOSE 118* 113* 107* 138* 112*  --  105*  BUN 12 11 11 16 16   --  16  CREATININE 1.09 0.88 0.89 1.10 1.17  --  1.10  CALCIUM 8.6* 8.7* 8.4* 8.9 9.2  --  9.0  MG  --  1.4*  --   --   --  2.0  --   PROT  --   --  6.2*  --   --   --   --  ALBUMIN  2.4* 2.4* 2.6*  --   --   --   --   AST  --   --  14*  --   --   --   --   ALT  --   --  10  --   --   --   --   ALKPHOS  --   --  48  --   --   --   --   BILITOT  --   --  1.2  --   --   --   --   GFRNONAA >60 >60 >60 >60 >60  --  >60  ANIONGAP 11 9 9 9 10   --  10    Lipids No results for input(s): CHOL, TRIG, HDL, LABVLDL, LDLCALC, CHOLHDL in the last 168 hours.  Hematology Recent Labs  Lab 01/09/23 0917 01/10/23 0606 01/11/23 0409  WBC 9.1 9.8 9.4  RBC 2.57* 2.62* 2.50*  HGB 8.9* 8.7* 8.5*  HCT 26.7* 27.2* 26.3*  MCV 103.9* 103.8* 105.2*  MCH 34.6* 33.2 34.0  MCHC 33.3 32.0 32.3  RDW 18.5* 18.9* 18.9*  PLT 356 378 363   Thyroid No results for input(s): TSH, FREET4 in the last 168 hours.  BNP Recent Labs  Lab 01/08/23 0303  BNP 809.0*    DDimer No results for input(s): DDIMER in the last 168 hours.   Radiology    No results found.  Cardiac Studies     Patient Profile      Assessment & Plan    1.New onset afib with RVR - new diagnosis this admission, presented with RVR in setting of EtOH intoxication - initially on dilt gtt, rates somewhat difficult to manage due to soft bp's - Concern for compliance and safety of oral anticoagulation in the setting of EtOH use on outpatient  basis. Would be hesitant to consider TEE/DCCV without confidence in anticoag compliance. Severe LAE LAVI 55, chronic EtOH abuse likely would be difficult to maintain SR. Some risk to consider amio chemical conversion as well for same reason.  - has been able to start eliquis , per GI ok to continue as Hgb has been stable    -unable to rate control due to low bp's. Started on oral amiodarone  200mg  bid. Remains on digoxin  0.25 - high rates at times. D/c oral amio, load IV 24-48 hrs.      2.Acute HFpEF - 12/2022 echo LVEF 55-60%, no WMAs, indet diastolic, RV not well visualized, severe LAE LAVI 55 - BNP up to 549, CXR with CHF. - had significant leg and scrotal edema - negative 3.7 L yesterday, neg 7.5 L since admission. He is on IV lasix  received 60mg  yesterday AM and 40mg  in PM   - some soft bp's today, we will slow down on diuresis. Dose IV lasix  40mg  x 1 later this afternoon.  - consider SGLT2i closer to discharge and after GI procedures.    - on oral lasix  40mg  daily, continue   3. Anemia with positive FOBT - per GI and primary team - -EGD consistent with gastritis no signs of bleeding  - patient refused inpatient colonoscopy prep   4. EtoH abuse  For questions or updates, please contact [facility] Please consult [email or web address] for contact info under        Signed, Alvan Carrier, MD  01/11/2023, 7:50 AM

## 2023-01-11 NOTE — Progress Notes (Signed)
 Physical Therapy Treatment Patient Details Name: Justin Fleming MRN: [id number] DOB: 1960-08-11 Today's Date: 01/11/2023   History of Present Illness History limited due to patient's intoxication/AMS, obtained by patient, chart review, EDP  Justin Fleming is a 63 y.o. male with hx of alcohol abuse, hypertension, mood disorder, smoking, GERD, who was brought in after bystander contacted EMS because he was sitting in his car in the parking lot.  Per triage found in his car with no heat on holding a bottle of liquor.  Initially was unable to provide any history.  At time of my interview appears more awake/interactive compared to ED evaluation.  He reports ongoing heavy alcohol use possibly as much is 1.75 L/day of liquor.  Denies any recent complaints.  However on review of systems reports bilateral lower extremity swelling, intermittent heart racing.  Denies any recent or active chest pain.  No recent fever/chills, cough/cold, abdominal pain, nausea, vomiting, diarrhea, dysuria, rashes, headache, focal numbness or weakness.  Denies any known history of A-fib    PT Comments  Patient c/o severe pain in legs with movement, pressure and limited to lifting bottom off bed during attempts to sit to stand and unable to transfer to chair.  Patient required Mod assist for repositioning self when put back to bed. Patient will benefit from continued skilled physical therapy in hospital and recommended venue below to increase strength, balance, endurance for safe ADLs and gait.     If plan is discharge home, recommend the following: A lot of help with walking and/or transfers;Help with stairs or ramp for entrance;Assistance with cooking/housework;A lot of help with bathing/dressing/bathroom   Can travel by private vehicle     Yes  Equipment Recommendations  Rolling walker (2 wheels);BSC/3in1    Recommendations for Other Services       Precautions / Restrictions Precautions Precautions:  Fall Restrictions Weight Bearing Restrictions Per Provider Order: No     Mobility  Bed Mobility Overal bed mobility: Needs Assistance Bed Mobility: Supine to Sit, Sit to Supine     Supine to sit: Mod assist Sit to supine: Mod assist   General bed mobility comments: increased time, labored movement with difficulty moving BLE due to pain    Transfers Overall transfer level: Needs assistance Equipment used: Rolling walker (2 wheels) Transfers: Sit to/from Stand Sit to Stand: Max assist           General transfer comment: limited to lifting bottom off bed, but unable to come to complete stand due to BLE weakness    Ambulation/Gait                   Stairs             Wheelchair Mobility     Tilt Bed    Modified Rankin (Stroke Patients Only)       Balance Overall balance assessment: Needs assistance Sitting-balance support: Feet supported, No upper extremity supported Sitting balance-Leahy Scale: Fair Sitting balance - Comments: fair/good seated at EOB   Standing balance support: Reliant on assistive device for balance, During functional activity, Bilateral upper extremity supported Standing balance-Leahy Scale: Poor Standing balance comment: poor/zero using RW                            Cognition Arousal: Alert Behavior During Therapy: WFL for tasks assessed/performed Overall Cognitive Status: Within Functional Limits for tasks assessed  Exercises General Exercises - Lower Extremity Long Arc Quad: Seated, AROM, Strengthening, Both, 10 reps Hip Flexion/Marching: Seated, AROM, Strengthening, Both, 10 reps Toe Raises: Seated, AROM, Strengthening, Both, 10 reps Heel Raises: Seated, AROM, Strengthening, Both, 10 reps    General Comments        Pertinent Vitals/Pain Pain Assessment Pain Assessment: Faces Faces Pain Scale: Hurts even more Pain Location: BLE with  movement, pressure Pain Descriptors / Indicators: Grimacing, Moaning, Sharp Pain Intervention(s): Limited activity within patient's tolerance, Monitored during session, Repositioned    Home Living                          Prior Function            PT Goals (current goals can now be found in the care plan section) Acute Rehab PT Goals Patient Stated Goal: return home with family to assist PT Goal Formulation: With patient Time For Goal Achievement: 01/15/23 Potential to Achieve Goals: Fair Progress towards PT goals: Progressing toward goals    Frequency    Min 3X/week      PT Plan      Co-evaluation              AM-PAC PT 6 Clicks Mobility   Outcome Measure  Help needed turning from your back to your side while in a flat bed without using bedrails?: A Lot Help needed moving from lying on your back to sitting on the side of a flat bed without using bedrails?: A Lot Help needed moving to and from a bed to a chair (including a wheelchair)?: A Lot Help needed standing up from a chair using your arms (e.g., wheelchair or bedside chair)?: A Lot Help needed to walk in hospital room?: Total Help needed climbing 3-5 steps with a railing? : Total 6 Click Score: 10    End of Session Equipment Utilized During Treatment: Oxygen Activity Tolerance: Patient tolerated treatment well;Patient limited by fatigue Patient left: in chair;with call bell/phone within reach   PT Visit Diagnosis: Unsteadiness on feet (R26.81);Other abnormalities of gait and mobility (R26.89);Muscle weakness (generalized) (M62.81)     Time: 8949-8884 PT Time Calculation (min) (ACUTE ONLY): 25 min  Charges:    $Therapeutic Exercise: 8-22 mins $Therapeutic Activity: 8-22 mins PT General Charges $$ ACUTE PT VISIT: 1 Visit                     3:55 PM, 01/11/23 Lynwood Music, MPT Physical Therapist with [facility] 336 [phone] office [phone] mobile phone

## 2023-01-11 NOTE — Progress Notes (Signed)
 PROGRESS NOTE   Patient: Justin Fleming                            PCP: Patient, No Pcp Per                    DOB: 1960/05/15            DOA: 12/29/2022 FMW:[id number]             DOS: 01/11/2023, 1:41 PM   LOS: 12 days   Date of Service: The patient was seen and examined on 01/11/2023  Subjective:   The patient was seen and examined this morning, stable no acute distress. Blood pressure -heart rate is still elevated  Hemoglobin stable on Eliquis   Brief Narrative:   History limited due to patient's intoxication/AMS, obtained by patient, chart review, EDP Lejon Afzal Maes is a 63 y.o. male with hx of alcohol abuse, hypertension, mood disorder, smoking, GERD, who was brought in after bystander contacted EMS because he was sitting in his car in the parking lot.   Found have alcohol intoxication, A-fib with RVR, suspected aspiration pneumonia, and worsening lactic acidosis with fluids.  Clinically suspect decompensated heart failure.  Denies any known history of A-fib.  EKG:  A-fib with RVR rate 159, LAD, LAFB, inferior Q's, poor R wave progression, T wave inversion laterally, no overt ischemic changes.    ED Course:  Found to be in A-fib with RVR, initially treated with metoprolol  2.5 mg IV x 1 with slight worsening of pressures to systolic in the 90s.  Started on heparin  for anticoagulation.  Treated with ceftriaxone /azithromycin , 2 L IV fluid, potassium 40 mg IV and mag 4 g.  At my request consulted with cardiology fellow re: rate control options.  Cards recommending possibly metoprolol  IV prn or digoxin , recommend against amiodarone .   =======================================================================  Assessment/Plan:  1)HFpEF --acute diastolic dysfunction CHF-echo as noted below #2 -Continue to improve clinically  -POA: Clinically appears volume overloaded--+ve JVD, significant peripheral edema including edema of the genitalia -Chest x-ray on admission on 12/30/2022    and repeat chest x-ray from 01/03/2023 showed pulmonary venous congestion and right sided pleural effusion -CTA on 12/31/2022 with bilateral pleural effusions -Daily weights, fluid input and output monitoring as ordered REDs Vest  01/07/23 -Dyspnea improving -Diuresing well--negative fluid balance -Cardiology following -Lasix  on hold due to BP concerns -Midodrine  added for BP support -Voiding well--- I's and O's not acute due to difficulty with male condom catheter  01/08/2023 Mildly hypotensive, Lasix  and metoprolol  were held, 250 of normal saline bolus given -EGD:  -tolerated well, gastritis, essentially unremarkable, no signs of bleeding or varices   01/09/2023 -Tolerated EGD on 01/08/2023, recommending follow-up with gastroenterologist at colonoscopy as an outpatient -Hemoglobin down from 9.0-8.9 no signs of bleeding  01/10/2023 -The patient was seen and examined, stable.  Heart rate remained stable Blood pressure has improved on midodrine  Continue Eliquis    01/11/2023 Blood pressure has stabilized Heart rate still elevated: Cardiologist Dr. Alvan started amiodarone  IV loading dose Continue dig     2)A-fib with RVR -new onset -New onset  in the setting of alcohol intoxication -CHA2DS2-VASc is at least 1 (HTN) -Echo from 12/31/2022 with EF of 55 to 60%, bilateral atrial enlargement noted, no significant valvular abnormalities --TSH -normal at 2.7 - Potassium and magnesium  WNL -Discontinued heparin , per GI okay to initiate Eliquis  -initiated 01/09/2023 -Rate control remains challenging -Discontinued Cardizem  due to soft  BP, discontinue dig -Cardiologist -initiated amiodarone  IV  -Not a candidate for cardioversion as to be difficult to commit to long-term anticoagulation post cardioversion in the setting of GI bleed without endoluminal evaluation  3)Acute hypoxic respiratory failure--- due to #1 and  #2 above -Clinically and radiologically no significant PNA---- -No  leukocytosis, No fevers, PCT Negative -Lactic acidosis improved with hydration -Blood cultures NGTD -Patient was initially treated with Rocephin -- -No Further antibiotics indicated -Hypoxia resolved---currently on room air -Frank Sepsis ruled out  4)Acute metabolic encephalopathy-----due to above -CT Head with mild diffuse atrophy, no acute abnormalities.   -Mentation back to baseline  5)Acute myocardial injury/demand ischemia in the setting of A-fib with RVR -Ischemic demand due to A-fib with RVR -Troponin 39 >> 34, -  EKG is A-fib with RVR, nonspecific changes without overt ischemic changes. -Management of alcohol withdrawal, A-fib RVR, heart failure, borderline hypotension, aspiration pneumonia per above. -Echo from 12/31/2022 with preserved EF, without wall motion abnormalities Appreciate cardiology close follow-up   6)-acute anemia with heme positive stool---  workup -EGD consistent with gastritis no signs of bleeding -Appears macrocytic and hyperchromic - Folate 3.6, B12 -158----B12 and folic acid  replacement initiated -- Iron  13, saturation 7% --- iron  supplementation ordered Hgb trended down from 11.5 on admission     Latest Ref Rng & Units 01/11/2023    4:09 AM 01/10/2023    6:06 AM 01/09/2023    9:17 AM  CBC  WBC 4.0 - 10.5 K/uL 9.4  9.8  9.1   Hemoglobin 13.0 - 17.0 g/dL 8.5  8.7  8.9   Hematocrit 39.0 - 52.0 % 26.3  27.2  26.7   Platelets 150 - 400 K/uL 363  378  356     -Continue IV PPI -Status post EGD-unremarkable, possible gastritis no signs of bleeding -Patient is noncompliant with colonoscopy prep (last colonoscopy was in 2016, no prior EGD) -Will follow-up as an outpatient for colonoscopy  7)AKI----acute kidney injury---due to dehydration -  creatinine on admission= 1.31 , - baseline creatinine =1.0     -Improved with hydration  8)Social/Ethics--- prior to admission lived alone, admits to heavy alcohol abuse -Patient Sister [name] is primary contact CODE  STATUS, plan of care, disposition currently being discussed with family  9)Alcohol intoxication/acute metabolic encephalopathy -Reports drinking up to 1.75 L of liquor a day --Patient completed Librium  taper,  , continue folic acid  thiamine  and multivitamin  --intermittently confused,   10)Fatty Liver with mild ascites--- in the setting of chronic alcohol abuse -Right upper quadrant ultrasound from 01/04/2023 noted -Supportive management -Diuretics as above  11)Asymptomatic cholelithiasis-- -remained asymptomatic --right upper quadrant ultrasound shows.. Contracted gallbladder with stones and sludge. No ductal dilatation.    12) diffuse pruritic rash Resolved with Benadryl  and discontinue chlorhexidine  wash    Capacity evaluation----per Dr. Rendall Sinner: Vicenta CROME Telfair is  a  63 y.o.  who is AAO x 3 without significant depressive symptoms and without delusion/psychosis who is unable to understand (without significant language barrier) his current medical diagnosis, patient also unable to accurately verbalizes understanding of proposed treatment options including option of no treatment, the consequences/risk versus benefit of each treatment option, alternatives as well as the option of no treatment. -- Based on my evaluation Dino L Borah  appears to Not have the Capacity to make decisions or give informed consent about his medical care.  A surrogate decision-maker is required as Vicenta L Vandevander  appears to Not have capacity to make his own decisions and give informed  consent regarding his medical care ---Patient remains forgetful and intermittently confused Discussed with sisters ---[name] and [name] as well as [name] and [name]--  at bedside--- they have offered to make medical decisions for patient  ----------------------------------------------------------------------------------------------------------------------------------------------- Nutritional  status:  The patient's BMI is: Body mass index is 28.88 kg/m. I agree with the assessment and plan as outlined below: Nutrition Status:  DVT prophylaxis:  Heparin  drip apixaban  (ELIQUIS ) tablet 5 mg  Code Status:   Code Status: Full Code  Family Communication: Discussed with sisters ---[name] and [name] as well as [name] and [name]--  Admission status:    Inpatient  Disposition: From  - home             Planning for discharge to SNF rehab Vs Home with [facility] Procedures:   Antimicrobials:  Anti-infectives (From admission, onward)    Start     Dose/Rate Route Frequency Ordered Stop   12/31/22 0000  cefTRIAXone  (ROCEPHIN ) 2 g in sodium chloride  0.9 % 100 mL IVPB        2 g 200 mL/hr over 30 Minutes Intravenous Every 24 hours 12/30/22 0443 01/02/23 0013   12/30/22 2200  azithromycin  (ZITHROMAX ) tablet 500 mg        500 mg Oral Daily at bedtime 12/30/22 0443 12/31/22 2112   12/30/22 0000  cefTRIAXone  (ROCEPHIN ) 2 g in sodium chloride  0.9 % 100 mL IVPB        2 g 200 mL/hr over 30 Minutes Intravenous  Once 12/29/22 2345 12/30/22 0100   12/30/22 0000  azithromycin  (ZITHROMAX ) 500 mg in sodium chloride  0.9 % 250 mL IVPB        500 mg 250 mL/hr over 60 Minutes Intravenous  Once 12/29/22 2345 12/30/22 0158      Medication:   apixaban   5 mg Oral BID   cyanocobalamin   1,000 mcg Intramuscular Once   vitamin B-12  1,000 mcg Oral Daily   docusate sodium   200 mg Oral Daily   ferrous sulfate   325 mg Oral BID WC   folic acid   1 mg Oral Daily   furosemide   40 mg Oral Daily   midodrine   15 mg Oral TID WC   multivitamin with minerals  1 tablet Oral Daily   pantoprazole  (PROTONIX ) IV  40 mg Intravenous Q12H   polyethylene glycol  17 g Oral Daily   sodium chloride  flush  10-40 mL Intracatheter Q12H   sodium chloride  flush  3 mL Intravenous Q12H   sucralfate   1 g Oral TID WC & HS   thiamine   100 mg Oral Daily   acetaminophen , diphenhydrAMINE , HYDROmorphone  (DILAUDID )  injection, mouth rinse, oxyCODONE , sodium chloride  flush  Objective:   Vitals:   01/11/23 1000 01/11/23 1100 01/11/23 1122 01/11/23 1200  BP: 111/77 139/88  139/88  Pulse: (!) 163 (!) 119  (!) 133  Resp: 14 12  (!) 21  Temp:   98.5 F (36.9 C)   TempSrc:   Oral   SpO2: 100% 100%  96%  Weight:      Height:        Intake/Output Summary (Last 24 hours) at 01/11/2023 1341 Last data filed at 01/11/2023 1047 Gross per 24 hour  Intake 240 ml  Output 1350 ml  Net -1110 ml   Filed Weights   01/09/23 0500 01/10/23 0500 01/11/23 0500  Weight: 90.4 kg 90.1 kg 91.3 kg    Physical examination:        General:  Lethargic easily  arousable, AAO x 2,  cooperative, no distress;   HEENT:  Normocephalic, PERRL, otherwise with in Normal limits   Neuro:  CNII-XII intact. , normal motor and sensation, reflexes intact   Lungs:   Clear to auscultation BL, Respirations unlabored,  No wheezes / crackles  Cardio:    S1/S2, RRR, No murmure, No Rubs or Gallops   Abdomen:  Soft, non-tender, bowel sounds active all four quadrants, no guarding or peritoneal signs.  Muscular  skeletal:  Limited exam -global generalized weaknesses - in bed, able to move all 4 extremities,   2+ pulses,  symmetric, No pitting edema  Skin:  Dry, warm to touch, negative for any Rashes,  Wounds: Please see nursing documentation          GU-improving penile and Scrotal Edema MSK--Rt arm PICC Line in situ Wounds: Please see nursing documentation  LABs:     Latest Ref Rng & Units 01/11/2023    4:09 AM 01/10/2023    6:06 AM 01/09/2023    9:17 AM  CBC  WBC 4.0 - 10.5 K/uL 9.4  9.8  9.1   Hemoglobin 13.0 - 17.0 g/dL 8.5  8.7  8.9   Hematocrit 39.0 - 52.0 % 26.3  27.2  26.7   Platelets 150 - 400 K/uL 363  378  356       Latest Ref Rng & Units 01/11/2023    4:09 AM 01/10/2023    6:06 AM 01/09/2023    6:23 AM  CMP  Glucose 70 - 99 mg/dL 894  887  861   BUN 8 - 23 mg/dL 16  16  16    Creatinine 0.61 - 1.24 mg/dL 8.89   8.82  8.89   Sodium 135 - 145 mmol/L 136  135  133   Potassium 3.5 - 5.1 mmol/L 3.8  3.4  3.4   Chloride 98 - 111 mmol/L 88  86  85   CO2 22 - 32 mmol/L 38  39  39   Calcium 8.9 - 10.3 mg/dL 9.0  9.2  8.9    Micro Results Recent Results (from the past 240 hours)  Culture, blood (Routine X 2) w Reflex to ID Panel     Status: None   Collection Time: 01/04/23  4:19 PM   Specimen: BLOOD LEFT HAND  Result Value Ref Range Status   Specimen Description BLOOD LEFT HAND  Final   Special Requests   Final    BOTTLES DRAWN AEROBIC AND ANAEROBIC Blood Culture adequate volume   Culture   Final    NO GROWTH 5 DAYS Performed at [facility], [address] [place], [place] [address]    Report Status 01/09/2023 FINAL  Final  Culture, blood (Routine X 2) w Reflex to ID Panel     Status: None   Collection Time: 01/04/23  4:19 PM   Specimen: BLOOD LEFT WRIST  Result Value Ref Range Status   Specimen Description BLOOD LEFT WRIST  Final   Special Requests   Final    BOTTLES DRAWN AEROBIC AND ANAEROBIC Blood Culture adequate volume   Culture   Final    NO GROWTH 5 DAYS Performed at [facility], [address], [place], [place] [address]    Report Status 01/09/2023 FINAL  Final   SIGNED: Adriana DELENA Grams, MD,  - [facility] Critical care time spent 55 minutes [facility],  Pager (please use [email or web address] to page/ text) Please use Epic Secure Chat for non-urgent communication (7AM-7PM)  If 7PM-7AM,  please contact night-coverage [email or web address], 01/11/2023, 1:41 PM

## 2023-01-11 NOTE — TOC Progression Note (Signed)
 Transition of Care [facility]) - Progression Note    Patient Details  Name: Justin Fleming MRN: [id number] Date of Birth: 1960/07/08  Transition of Care [facility]) CM/SW Contact  Sharlyne Stabs, RN Phone Number: 01/11/2023, 1:00 PM  Clinical Narrative:   CM spoke with his sister. She states that effect Jan 1st he has medicare, but they do not have his card. CM called registration, they were able to pull his medicare number and add to Epic. Patient is not walking, sister want TOC to sent out for SNF bed offers. Prefers to stay close to home. TOC completed PASSR and FL2, and sending out. MD updated.     Expected Discharge Plan: Skilled Nursing Facility Barriers to Discharge: No SNF bed  Expected Discharge Plan and Services       Living arrangements for the past 2 months: Single Family Home                     Social Determinants of Health (SDOH) Interventions SDOH Screenings   Food Insecurity: No Food Insecurity (12/30/2022)  Housing: Low Risk  (12/30/2022)  Transportation Needs: No Transportation Needs (12/30/2022)  Utilities: Not At Risk (12/30/2022)  Alcohol Screen: Low Risk  (02/02/2018)  Depression (PHQ2-9): Low Risk  (02/02/2018)  Tobacco Use: Medium Risk (01/08/2023)    Readmission Risk Interventions    01/11/2023   12:44 PM  Readmission Risk Prevention Plan  Transportation Screening Complete  PCP or Specialist Appt within 5-7 Days Not Complete  Home Care Screening Complete  Medication Review (RN CM) Complete

## 2023-01-11 NOTE — [place] FL2 (Signed)
 [place]  MEDICAID FL2 LEVEL OF CARE FORM     IDENTIFICATION  Patient Name: Justin Fleming Birthdate: [birth year] Sex: male Admission Date (Current Location): 12/29/2022  [facility] and [place] Number:  [employer] and Address:  [facility],  [address], [place] [address]      Provider Number: [id number]  Attending Physician Name and Address:  Willette Adriana LABOR, MD  Relative Name and Phone Number:  Jenel Boatman (Sister)  [phone]    Current Level of Care: Hospital Recommended Level of Care: Skilled Nursing Facility Prior Approval Number:    Date Approved/Denied:   PASRR Number: [id number]  Discharge Plan: SNF    Current Diagnoses: Patient Active Problem List   Diagnosis Date Noted   Elevated troponin 01/07/2023   Vitamin B12 deficiency 01/07/2023   Normochromic normocytic anemia 01/04/2023   Persistent atrial fibrillation (HCC) 01/01/2023   Anemia due to acute blood loss 01/01/2023   Alcohol intoxication (HCC) 12/30/2022   Atrial fibrillation with rapid ventricular response (HCC) 12/30/2022   Lactic acidosis 12/30/2022   MDD (major depressive disorder) (HCC) 09/12/2014   Alcohol abuse 09/05/2014   Hepatomegaly 05/23/2014   Insomnia 07/12/2013   GAD (generalized anxiety disorder) 06/23/2013   Essential hypertension, benign 06/23/2013   Tremor 06/23/2013   GERD (gastroesophageal reflux disease) 06/23/2013   Tobacco use disorder 06/23/2013    Orientation RESPIRATION BLADDER Height & Weight     Self, Place, Situation  Normal (See DC Summary) Continent Weight: 91.3 kg Height:  5' 10 (177.8 cm)  BEHAVIORAL SYMPTOMS/MOOD NEUROLOGICAL BOWEL NUTRITION STATUS      Continent Diet (DC Summary)  AMBULATORY STATUS COMMUNICATION OF NEEDS Skin   Extensive Assist Verbally Normal                       Personal Care Assistance Level of Assistance  Bathing, Feeding, Dressing Bathing Assistance: Maximum assistance Feeding  assistance: Limited assistance Dressing Assistance: Maximum assistance     Functional Limitations Info  Sight, Hearing, Speech Sight Info: Impaired Hearing Info: Adequate Speech Info: Adequate    SPECIAL CARE FACTORS FREQUENCY  PT (By licensed PT)     PT Frequency: 5 times a week              Contractures Contractures Info: Not present    Additional Factors Info  Code Status, Allergies Code Status Info: FULL Allergies Info: Chlorhexidine  Gluconate           Current Medications (01/11/2023):  This is the current hospital active medication list Current Facility-Administered Medications  Medication Dose Route Frequency Provider Last Rate Last Admin   acetaminophen  (TYLENOL ) tablet 650 mg  650 mg Oral Q6H PRN Emokpae, Courage, MD   650 mg at 01/10/23 1243   amiodarone  (NEXTERONE  PREMIX) 360-4.14 MG/200ML-% (1.8 mg/mL) IV infusion  60 mg/hr Intravenous Continuous Alvan Dorn FALCON, MD 33.3 mL/hr at 01/11/23 1238 60 mg/hr at 01/11/23 1238   Followed by   amiodarone  (NEXTERONE  PREMIX) 360-4.14 MG/200ML-% (1.8 mg/mL) IV infusion  30 mg/hr Intravenous Continuous Alvan Dorn FALCON, MD       apixaban  (ELIQUIS ) tablet 5 mg  5 mg Oral BID Shahmehdi, Seyed A, MD   5 mg at 01/11/23 9072   cyanocobalamin  (VITAMIN B12) injection 1,000 mcg  1,000 mcg Intramuscular Once Emokpae, Courage, MD       cyanocobalamin  (VITAMIN B12) tablet 1,000 mcg  1,000 mcg Oral Daily Willette Adriana A, MD   1,000 mcg at 01/11/23 0925  diphenhydrAMINE  (BENADRYL ) capsule 25 mg  25 mg Oral Q8H PRN Willette Jest A, MD   25 mg at 01/08/23 9073   docusate sodium  (COLACE) capsule 200 mg  200 mg Oral Daily Shahmehdi, Seyed A, MD   200 mg at 01/11/23 9072   ferrous sulfate  tablet 325 mg  325 mg Oral BID WC Shahmehdi, Seyed A, MD   325 mg at 01/11/23 9072   folic acid  (FOLVITE ) tablet 1 mg  1 mg Oral Daily Segars, Dorn, MD   1 mg at 01/11/23 9072   furosemide  (LASIX ) tablet 40 mg  40 mg Oral Daily Shahmehdi,  Seyed A, MD   40 mg at 01/11/23 9072   HYDROmorphone  (DILAUDID ) injection 0.5 mg  0.5 mg Intravenous Q4H PRN Shahmehdi, Seyed A, MD       midodrine  (PROAMATINE ) tablet 15 mg  15 mg Oral TID WC Segars, Jonathan, MD   15 mg at 01/11/23 1230   multivitamin with minerals tablet 1 tablet  1 tablet Oral Daily Segars, Jonathan, MD   1 tablet at 01/11/23 9073   Oral care mouth rinse  15 mL Mouth Rinse PRN Emokpae, Courage, MD       oxyCODONE  (Oxy IR/ROXICODONE ) immediate release tablet 5 mg  5 mg Oral Q8H PRN Pearlean, Courage, MD   5 mg at 01/11/23 0947   pantoprazole  (PROTONIX ) injection 40 mg  40 mg Intravenous Q12H Emokpae, Courage, MD   40 mg at 01/11/23 0927   polyethylene glycol (MIRALAX  / GLYCOLAX ) packet 17 g  17 g Oral Daily Shahmehdi, Seyed A, MD   17 g at 01/11/23 9072   sodium chloride  flush (NS) 0.9 % injection 10-40 mL  10-40 mL Intracatheter Q12H Shahmehdi, Seyed A, MD   10 mL at 01/11/23 0929   sodium chloride  flush (NS) 0.9 % injection 10-40 mL  10-40 mL Intracatheter PRN Shahmehdi, Seyed A, MD       sodium chloride  flush (NS) 0.9 % injection 3 mL  3 mL Intravenous Q12H Segars, Dorn, MD   3 mL at 01/11/23 0930   sucralfate  (CARAFATE ) 1 GM/10ML suspension 1 g  1 g Oral TID WC & HS Emokpae, Courage, MD   1 g at 01/11/23 1230   thiamine  (VITAMIN B1) tablet 100 mg  100 mg Oral Daily Emokpae, Courage, MD   100 mg at 01/11/23 9074     Discharge Medications: Please see discharge summary for a list of discharge medications.  Relevant Imaging Results:  Relevant Lab Results:   Additional Information SS# [id number]  Sharlyne Stabs, RN

## 2023-01-11 NOTE — Progress Notes (Addendum)
 Daily Progress Note   Patient Name: Justin Fleming       Date: 01/11/2023 DOB: 1960/03/04  Age: 63 y.o. MRN#: [id number] Attending Physician: Willette Adriana LABOR, MD Primary Care Physician: Patient, No Pcp Per Admit Date: 12/29/2022 Length of Stay: 12 days  Reason for Consultation/Follow-up: Establishing goals of care  HPI/Patient Profile:  63 y.o. male  with past medical history of HTN, GERD, anxiety/depression, alcohol abuse with rehab in 2016, currently drinking a pint of liquor per day, admitted on 12/29/2022 with HFpEF, A-fib with RVR/acute myocardial injury due to demand ischemia and acute anemia with heme positive stool with currently stable hemoglobin.   Palliative medicine was consulted for GOC conversations  Subjective:   Subjective: Chart Reviewed. Updates received. Patient Assessed. Created space and opportunity for patient  and family to explore thoughts and feelings regarding current medical situation.  Today's Discussion: Today I saw the patient at the bedside, no family or staff were present. The patient was sleeping but easily woke to voice and touch. Did not make eye contact but did interact appropriately. His is oriented x 4, knows his name, he's at [facility] and it's 2025.  He is able to answer complex questions related to his health.  He is able to tell me that he is here because of his drinking and that he wants to stop drinking.  He is also able to tell me about his heart (fast heartbeat that is irregular).  He tells me that he is feeling better today.  He notes some knee pain, but denies pain otherwise.  Specifically denies abdominal pain, chest pain, nausea/vomiting.  He also denies significant dyspnea.  The patient appears to have improved mental status.  At this point I feel he has capacity to make his own decisions.  He is taking all of his medications as recommended at this point.  I did opt to rebroach CODE STATUS discussion with him.  We had significant  discussion about this with substantial explanation on the risks/cons of resuscitation as well as the pros/benefits of resuscitation.  He was quite clear that he would not want CPR or breathing tubes.  He further noted that he does not think his sisters would agree with this.  I encouraged him to have conversations with his family so they can understand his decisions and the rationale behind these.  I also asked him who he would want to make his decisions in the future if he could not.  He states that any of his sisters could make his decisions for him.  I told him as long as he is able to make his own decisions we would ask him first.  He verbalized understanding.  Later in the day I called his sister [name] and updated her on his status.  I answered questions regarding SNF placement process.  I told her that the [place] FL2 has been sent and social work would be in contact when bed offers were made.  I shared my hope that he would be able to leave the hospital soon and get rehab to get stronger.  I told the patient's sister that I would check in on the patient in a couple days, unless something significantly changed.  I told her of my hope for continued improvement. I provided emotional and general support through therapeutic listening, empathy, sharing of stories, therapeutic touch, and other techniques. I answered all questions and addressed all concerns to the best of my ability.  Review of Systems  Constitutional:  Positive for fatigue.  Respiratory:  Negative for chest tightness.   Cardiovascular:  Negative for chest pain.  Gastrointestinal:  Negative for abdominal pain, nausea and vomiting.  Musculoskeletal:        Noted knee pain  Neurological:  Positive for weakness.    Objective:   Vital Signs:  BP (!) 118/92 (BP Location: Left Arm)   Pulse (!) 102   Temp 98.6 F (37 C) (Oral)   Resp 17   Ht 5' 10 (1.778 m)   Wt 91.3 kg   SpO2 100%   BMI 28.88 kg/m   Physical Exam Vitals and  nursing note reviewed.  Constitutional:      General: He is sleeping. He is not in acute distress. Cardiovascular:     Rate and Rhythm: Normal rate.  Pulmonary:     Effort: Pulmonary effort is normal. No respiratory distress.     Breath sounds: No wheezing or rhonchi.  Abdominal:     General: Abdomen is flat.     Palpations: Abdomen is soft.  Skin:    General: Skin is warm and dry.  Neurological:     General: No focal deficit present.     Mental Status: He is easily aroused.  Psychiatric:        Mood and Affect: Mood normal.        Behavior: Behavior normal.     Palliative Assessment/Data: 40%    Existing Vynca/ACP Documentation: None  Assessment & Plan:   Impression: Present on Admission:  Alcohol intoxication (HCC)  Atrial fibrillation with rapid ventricular response (HCC)  Lactic acidosis  Anemia due to acute blood loss  Tobacco use disorder  Alcohol abuse  Essential hypertension, benign  Elevated troponin  Vitamin B12 deficiency  SUMMARY OF RECOMMENDATIONS   Changed to DNR-limited Continue full scope of care otherwise Encourage family communication around advance directives Palliative medicine will follow-up in 2 days if the patient remains admitted Please notify us  of any significant clinical change or new palliative needs in the meantime   Symptom Management:  Per primary team PMT is available to assist as needed  Code Status: DNR-limited  Prognosis: Unable to determine  Discharge Planning:  SNF/rehab  Discussed with: Patient, medical team, nursing team, patient's family  Thank you for allowing us  to participate in the care of Galvin L Goar PMT will continue to support holistically.  Time Total: 45 min  Detailed review of medical records (labs, imaging, vital signs), medically appropriate exam, discussed with treatment team, counseling and education to patient, family, & staff, documenting clinical information, medication management,  coordination of care  Camellia Kays, NP Palliative Medicine Team  Team Phone # [phone] (Nights/Weekends)  09/03/2020, 8:17 AM

## 2023-01-11 NOTE — Plan of Care (Signed)

## 2023-01-11 NOTE — Progress Notes (Signed)
   Advanced Care Planning Note   Patient Name: Justin Fleming       Date: 01/11/2023 DOB: 1960-08-20  Age: 63 y.o. MRN#: [id number] Attending Physician: Willette Adriana LABOR, MD Primary Care Physician: Patient, No Pcp Per Admit Date: 12/29/2022 Length of Stay: 12 days  Advanced Care Planning Details:   Persons Present: ACP Persons Present: Patient  Provider(s) Present: Palliative Medicine Provider Camellia Kays, DNP, AGNP-C  All persons present were open and agreeable to conversation about advanced care planning. Education offered on the importance of documentation and the logistics of securing advanced directives.  Specifically had an in-depth discussion about CODE STATUS.  We had in-depth discussion on current health status, acute health situation, chronic comorbidities, illness trajectory, resuscitation specifics.  Education was offered on the following:   Medical treatment options available, Possible risks and benefits of each option/intervention, Available ACP documents able to be completed, and Importance/Benefit of completing ACP documents  Forms completed include:  DNR ([place]) These forms can be found in the ACP link in the area under the patient's picture  After conversation, patient and/or family determined the desired discharge disposition at this time would be:  SNF/Rehab    Summary of ACP Decisions:   Change CODE STATUS to DNR-limited Continue full scope of care otherwise     Thank you for allowing us  to participate in the care of Justin Fleming PMT will continue to support holistically.  Total ACP Time: 20 min  The time above is specific to time spend on advanced care planning. It does not include any separately billed services.  Signed by: Camellia Kays, DNP, AGNP-C Palliative Medicine Team  Team Phone # [phone] (Nights/Weekends)  01/11/2023, 2:38 PM

## 2023-01-12 DIAGNOSIS — I4891 Unspecified atrial fibrillation: Secondary | ICD-10-CM | POA: Diagnosis not present

## 2023-01-12 LAB — URINALYSIS, ROUTINE W REFLEX MICROSCOPIC
Bilirubin Urine: NEGATIVE
Glucose, UA: NEGATIVE mg/dL
Hgb urine dipstick: NEGATIVE
Ketones, ur: NEGATIVE mg/dL
Leukocytes,Ua: NEGATIVE
Nitrite: NEGATIVE
Protein, ur: 30 mg/dL — AB
Specific Gravity, Urine: 1.019 (ref 1.005–1.030)
pH: 5 (ref 5.0–8.0)

## 2023-01-12 LAB — RESP PANEL BY RT-PCR (RSV, FLU A&B, COVID)  RVPGX2
Influenza A by PCR: NEGATIVE
Influenza B by PCR: NEGATIVE
Resp Syncytial Virus by PCR: NEGATIVE
SARS Coronavirus 2 by RT PCR: NEGATIVE

## 2023-01-12 LAB — BASIC METABOLIC PANEL
Anion gap: 5 (ref 5–15)
BUN: 19 mg/dL (ref 8–23)
CO2: 40 mmol/L — ABNORMAL HIGH (ref 22–32)
Calcium: 8.7 mg/dL — ABNORMAL LOW (ref 8.9–10.3)
Chloride: 86 mmol/L — ABNORMAL LOW (ref 98–111)
Creatinine, Ser: 1.13 mg/dL (ref 0.61–1.24)
GFR, Estimated: >60 mL/min (ref >60)
Glucose, Bld: 104 mg/dL — ABNORMAL HIGH (ref 70–99)
Potassium: 3.6 mmol/L (ref 3.5–5.1)
Sodium: 131 mmol/L — ABNORMAL LOW (ref 135–145)

## 2023-01-12 LAB — VITAMIN B1: Vitamin B1 (Thiamine): 87.5 nmol/L (ref 66.5–200.0)

## 2023-01-12 MED ORDER — AMIODARONE LOAD VIA INFUSION
150.0000 mg | Freq: Once | INTRAVENOUS | Status: AC
  Administered 2023-01-12: 150 mg via INTRAVENOUS
  Filled 2023-01-12: qty 83.34

## 2023-01-12 NOTE — Progress Notes (Signed)
 Rounding Note    Patient Name: Justin Fleming Date of Encounter: 01/12/2023  [facility] Cardiologist: New  Subjective   No complaints  Inpatient Medications    Scheduled Meds:  apixaban   5 mg Oral BID   cyanocobalamin   1,000 mcg Intramuscular Once   vitamin B-12  1,000 mcg Oral Daily   docusate sodium   200 mg Oral Daily   ferrous sulfate   325 mg Oral BID WC   folic acid   1 mg Oral Daily   furosemide   40 mg Oral Daily   midodrine   15 mg Oral TID WC   multivitamin with minerals  1 tablet Oral Daily   pantoprazole  (PROTONIX ) IV  40 mg Intravenous Q12H   polyethylene glycol  17 g Oral Daily   sodium chloride  flush  10-40 mL Intracatheter Q12H   sodium chloride  flush  3 mL Intravenous Q12H   sucralfate   1 g Oral TID WC & HS   thiamine   100 mg Oral Daily   Continuous Infusions:  amiodarone  30 mg/hr (01/12/23 0208)   PRN Meds: acetaminophen , diphenhydrAMINE , HYDROmorphone  (DILAUDID ) injection, mouth rinse, oxyCODONE , sodium chloride  flush   Vital Signs    Vitals:   01/12/23 0018 01/12/23 0100 01/12/23 0340 01/12/23 0631  BP: 106/74 106/76    Pulse: 79 82    Resp: 18 19    Temp:   98.1 F (36.7 C)   TempSrc:   Oral   SpO2: 100% 100%    Weight:    90.5 kg  Height:        Intake/Output Summary (Last 24 hours) at 01/12/2023 0754 Last data filed at 01/12/2023 0626 Gross per 24 hour  Intake 490 ml  Output 1300 ml  Net -810 ml      01/12/2023    6:31 AM 01/11/2023    5:00 AM 01/10/2023    5:00 AM  Last 3 Weights  Weight (lbs) 199 lb 8.3 oz 201 lb 4.5 oz 198 lb 10.2 oz  Weight (kg) 90.5 kg 91.3 kg 90.1 kg      Telemetry    Afib rates variable - Personally Reviewed  ECG    N/a - Personally Reviewed  Physical Exam   GEN: No acute distress.   Neck: No JVD Cardiac: irreg Respiratory: Clear to auscultation bilaterally. GI: Soft, nontender, non-distended  MS: trace bilateral edema Neuro:  Nonfocal  Psych: Normal affect   Labs    High  Sensitivity Troponin:   Recent Labs  Lab 12/30/22 0001 12/30/22 0239  TROPONINIHS 39* 34*     Chemistry Recent Labs  Lab 01/06/23 0448 01/07/23 0409 01/08/23 0303 01/09/23 9376 01/10/23 0606 01/10/23 0755 01/11/23 0409 01/12/23 0449  NA 131* 129* 130*   < > 135  --  136 131*  K 3.1* 3.8 3.5   < > 3.4*  --  3.8 3.6  CL 85* 82* 83*   < > 86*  --  88* 86*  CO2 35* 38* 38*   < > 39*  --  38* 40*  GLUCOSE 118* 113* 107*   < > 112*  --  105* 104*  BUN 12 11 11    < > 16  --  16 19  CREATININE 1.09 0.88 0.89   < > 1.17  --  1.10 1.13  CALCIUM 8.6* 8.7* 8.4*   < > 9.2  --  9.0 8.7*  MG  --  1.4*  --   --   --  2.0  --   --  PROT  --   --  6.2*  --   --   --   --   --   ALBUMIN  2.4* 2.4* 2.6*  --   --   --   --   --   AST  --   --  14*  --   --   --   --   --   ALT  --   --  10  --   --   --   --   --   ALKPHOS  --   --  48  --   --   --   --   --   BILITOT  --   --  1.2  --   --   --   --   --   GFRNONAA >60 >60 >60   < > >60  --  >60 >60  ANIONGAP 11 9 9    < > 10  --  10 5   < > = values in this interval not displayed.    Lipids No results for input(s): CHOL, TRIG, HDL, LABVLDL, LDLCALC, CHOLHDL in the last 168 hours.  Hematology Recent Labs  Lab 01/09/23 0917 01/10/23 0606 01/11/23 0409  WBC 9.1 9.8 9.4  RBC 2.57* 2.62* 2.50*  HGB 8.9* 8.7* 8.5*  HCT 26.7* 27.2* 26.3*  MCV 103.9* 103.8* 105.2*  MCH 34.6* 33.2 34.0  MCHC 33.3 32.0 32.3  RDW 18.5* 18.9* 18.9*  PLT 356 378 363   Thyroid No results for input(s): TSH, FREET4 in the last 168 hours.  BNP Recent Labs  Lab 01/08/23 0303  BNP 809.0*    DDimer No results for input(s): DDIMER in the last 168 hours.   Radiology    DG CHEST PORT 1 VIEW Result Date: 01/11/2023 CLINICAL DATA:  Fevers EXAM: PORTABLE CHEST 1 VIEW COMPARISON:  01/08/2023 FINDINGS: Cardiac shadow is within normal limits. Lungs are well aerated bilaterally. Elevation of the left hemidiaphragm is seen. Right PICC is seen with  the catheter tip in the superior right atrium. This is stable from the prior study. No other focal abnormality is seen. IMPRESSION: No change from the previous day. Electronically Signed   By: Oneil Devonshire M.D.   On: 01/11/2023 20:47    Cardiac Studies    Patient Profile      Assessment & Plan    1.New onset afib with RVR - new diagnosis this admission, presented with RVR in setting of EtOH intoxication - initially on dilt gtt, rates somewhat difficult to manage due to soft bp's - Concern for compliance and safety of oral anticoagulation in the setting of EtOH use on outpatient basis. Would be hesitant to consider TEE/DCCV without confidence in anticoag compliance. Severe LAE LAVI 55, chronic EtOH abuse likely would be difficult to maintain SR.  - has been able to start eliquis , per GI ok to continue as Hgb has been stable   - failed rate control attempts due to low bp's on and ongoing tachycardia - initially started on oral amio, rates improved but still tachy at times. 01/10/22 started on IV amiodarone . Dig stopped due to higher levels -remains in afib with high rates at times, rebolus amio 150mg  then continue drip.      2.Acute HFpEF - 12/2022 echo LVEF 55-60%, no WMAs, indet diastolic, RV not well visualized, severe LAE LAVI 55 - BNP up to 549, CXR with CHF. - had significant leg and scrotal edema - negative 810 mL yesterday, neg  10.2 L since admission. Renal function overall stable  - on oral lasix  40mg  daily, continue    3. Anemia with positive FOBT - per GI and primary team - -EGD consistent with gastritis no signs of bleeding  - patient refused inpatient colonoscopy prep   4. EtoH abuse  For questions or updates, please contact [facility] Please consult [email or web address] for contact info under        Signed, Alvan Carrier, MD  01/12/2023, 7:54 AM

## 2023-01-12 NOTE — Progress Notes (Signed)
 PROGRESS NOTE   Patient: Justin Fleming                            PCP: Patient, No Pcp Per                    DOB: 03/10/1960            DOA: 12/29/2022 FMW:[id number]             DOS: 01/12/2023, 1:39 PM   LOS: 13 days   Date of Service: The patient was seen and examined on 01/12/2023  Subjective:   Was seen and examined this morning, awake alert oriented x 4 no acute distress On amiodarone  drip.  Hemoglobin stable on Eliquis   Brief Narrative:   History limited due to patient's intoxication/AMS, obtained by patient, chart review, EDP Justin Fleming is a 63 y.o. male with hx of alcohol abuse, hypertension, mood disorder, smoking, GERD, who was brought in after bystander contacted EMS because he was sitting in his car in the parking lot.   Found have alcohol intoxication, A-fib with RVR, suspected aspiration pneumonia, and worsening lactic acidosis with fluids.  Clinically suspect decompensated heart failure.  Denies any known history of A-fib.  EKG:  A-fib with RVR rate 159, LAD, LAFB, inferior Q's, poor R wave progression, T wave inversion laterally, no overt ischemic changes.    ED Course:  Found to be in A-fib with RVR, initially treated with metoprolol  2.5 mg IV x 1 with slight worsening of pressures to systolic in the 90s.  Started on heparin  for anticoagulation.  Treated with ceftriaxone /azithromycin , 2 L IV fluid, potassium 40 mg IV and mag 4 g.  At my request consulted with cardiology fellow re: rate control options.  Cards recommending possibly metoprolol  IV prn or digoxin , recommend against amiodarone .   =======================================================================  Assessment/Plan:  1)HFpEF --acute diastolic dysfunction CHF-echo as noted below #2 -Continue to improve clinically  -POA: Clinically appears volume overloaded--+ve JVD, significant peripheral edema including edema of the genitalia -Chest x-ray on admission on 12/30/2022   and repeat chest x-ray  from 01/03/2023 showed pulmonary venous congestion and right sided pleural effusion -CTA on 12/31/2022 with bilateral pleural effusions -Daily weights, fluid input and output monitoring as ordered REDs Vest  01/07/23 -Dyspnea improving -Diuresing well--negative fluid balance -Cardiology following -Lasix  on hold due to BP concerns -Midodrine  added for BP support -Voiding well--- I's and O's not acute due to difficulty with male condom catheter  01/08/2023 Mildly hypotensive, Lasix  and metoprolol  were held, 250 of normal saline bolus given -EGD:  -tolerated well, gastritis, essentially unremarkable, no signs of bleeding or varices   01/09/2023 -Tolerated EGD on 01/08/2023, recommending follow-up with gastroenterologist at colonoscopy as an outpatient -Hemoglobin down from 9.0-8.9 no signs of bleeding  01/10/2023 -The patient was seen and examined, stable.  Heart rate remained stable Blood pressure has improved on midodrine  Continue Eliquis    01/11/2023 Blood pressure has stabilized Heart rate still elevated: Cardiologist Dr. Alvan started amiodarone  IV loading dose Continue dig  01/12/2023 Was seen and examined this morning, hemodynamically stable, still on amiodarone  drip Pursued with Mini-Mental exam-patient passed Believe patient is capable of making medical decision for himself     2)A-fib with RVR -new onset -New onset  in the setting of alcohol intoxication -CHA2DS2-VASc is at least 1 (HTN) -Echo from 12/31/2022 with EF of 55 to 60%, bilateral atrial enlargement noted, no significant valvular abnormalities --TSH -  normal at 2.7 - Potassium and magnesium  WNL -Discontinued heparin , per GI okay to initiate Eliquis  -initiated 01/09/2023 -Rate control remains challenging -Discontinued Cardizem  due to soft BP, discontinue dig -Cardiologist -initiated amiodarone  IV  -Not a candidate for cardioversion as to be difficult to commit to long-term anticoagulation post cardioversion in the  setting of GI bleed without endoluminal evaluation  3)Acute hypoxic respiratory failure--- due to #1 and  #2 above -Clinically and radiologically no significant PNA---- -Lactic acidosis improved with hydration -Blood cultures NGTD -Patient was initially treated with Rocephin --completed antibiotic course -Hypoxia resolved---currently on room air -[name] ruled out  4)Acute metabolic encephalopathy-----due to above -CT Head with mild diffuse atrophy, no acute abnormalities.   -Mentation back to baseline  5)Acute myocardial injury/demand ischemia in the setting of A-fib with RVR -Ischemic demand due to A-fib with RVR -Troponin 39 >> 34, -  EKG is A-fib with RVR, nonspecific changes without overt ischemic changes. -S/p Management of alcohol withdrawal, A-fib RVR, heart failure, hypotension, aspiration pneumonia per above. -Echo from 12/31/2022 with preserved EF, without wall motion abnormalities Appreciate cardiology close follow-up   6)Acute anemia with heme positive stool---  workup -EGD consistent with gastritis no signs of bleeding -Appears macrocytic and hyperchromic - Folate 3.6, B12 -158----B12 and folic acid  replacement initiated -- Iron  13, saturation 7% --- iron  supplementation ordered  Hgb trended down from 11.5 on admission -      Latest Ref Rng & Units 01/11/2023    4:09 AM 01/10/2023    6:06 AM 01/09/2023    9:17 AM  CBC  WBC 4.0 - 10.5 K/uL 9.4  9.8  9.1   Hemoglobin 13.0 - 17.0 g/dL 8.5  8.7  8.9   Hematocrit 39.0 - 52.0 % 26.3  27.2  26.7   Platelets 150 - 400 K/uL 363  378  356     -Continue PPI -Status post EGD-unremarkable, possible gastritis no signs of bleeding -Patient is noncompliant with colonoscopy prep (last colonoscopy was in 2016, no prior EGD) -Will follow-up as an outpatient for colonoscopy    7)AKI----acute kidney injury---due to dehydration -  creatinine on admission= 1.31 , - baseline creatinine =1.0     -Improved with  hydration  8)Social/Ethics--- prior to admission lived alone, admits to heavy alcohol abuse -Patient Sister Justin Fleming is primary contact   9)Alcohol intoxication/acute metabolic encephalopathy -Reports drinking up to 1.75 L of liquor a day -Patient completed Librium  taper,  -  continue folic acid  thiamine  and multivitamin    10)Fatty Liver with mild ascites--- in the setting of chronic alcohol abuse -Right upper quadrant ultrasound from 01/04/2023 noted -Supportive management   11)Asymptomatic cholelithiasis-- -remained asymptomatic --right upper quadrant ultrasound shows.. Contracted gallbladder with stones and sludge. No ductal dilatation.    12) diffuse pruritic rash Resolved with Benadryl  and discontinue chlorhexidine  wash   Capacity evaluation----per Dr. Rendall Sinner: Earlier on tn this admission Patient was found incompetent due to intermittent confusion and forgetfulness He Discussed it with sisters ---[name] and [name] as well as [name] and [name]--  at bedside--- they have offered to make medical decisions for patient   Today 01/12/2023 the patient passed the Mini-Mental exam-- he is more competent to make decisions for himself.  He was informed to assign POA Active care following closely    ----------------------------------------------------------------------------------------------------------------------------------------------- Nutritional status:  The patient's BMI is: Body mass index is 28.63 kg/m. I agree with the assessment and plan as outlined below: Nutrition Status:  DVT prophylaxis:  Heparin  drip  apixaban  (ELIQUIS ) tablet 5 mg  Code Status:   Code Status: Limited: Do not attempt resuscitation (DNR) -DNR-LIMITED -Do Not Intubate/DNI   Family Communication: Discussed with sisters - Family involved: [name] and [name] as well as [name] and [name]--  Admission status:    Inpatient  Disposition: From  - home              Planning for discharge to SNF rehab Vs Home with [facility] Procedures:   Antimicrobials:  Anti-infectives (From admission, onward)    Start     Dose/Rate Route Frequency Ordered Stop   12/31/22 0000  cefTRIAXone  (ROCEPHIN ) 2 g in sodium chloride  0.9 % 100 mL IVPB        2 g 200 mL/hr over 30 Minutes Intravenous Every 24 hours 12/30/22 0443 01/02/23 0013   12/30/22 2200  azithromycin  (ZITHROMAX ) tablet 500 mg        500 mg Oral Daily at bedtime 12/30/22 0443 12/31/22 2112   12/30/22 0000  cefTRIAXone  (ROCEPHIN ) 2 g in sodium chloride  0.9 % 100 mL IVPB        2 g 200 mL/hr over 30 Minutes Intravenous  Once 12/29/22 2345 12/30/22 0100   12/30/22 0000  azithromycin  (ZITHROMAX ) 500 mg in sodium chloride  0.9 % 250 mL IVPB        500 mg 250 mL/hr over 60 Minutes Intravenous  Once 12/29/22 2345 12/30/22 0158      Medication:   apixaban   5 mg Oral BID   cyanocobalamin   1,000 mcg Intramuscular Once   vitamin B-12  1,000 mcg Oral Daily   docusate sodium   200 mg Oral Daily   ferrous sulfate   325 mg Oral BID WC   folic acid   1 mg Oral Daily   furosemide   40 mg Oral Daily   midodrine   15 mg Oral TID WC   multivitamin with minerals  1 tablet Oral Daily   pantoprazole  (PROTONIX ) IV  40 mg Intravenous Q12H   polyethylene glycol  17 g Oral Daily   sodium chloride  flush  10-40 mL Intracatheter Q12H   sodium chloride  flush  3 mL Intravenous Q12H   sucralfate   1 g Oral TID WC & HS   thiamine   100 mg Oral Daily   acetaminophen , diphenhydrAMINE , HYDROmorphone  (DILAUDID ) injection, mouth rinse, oxyCODONE , sodium chloride  flush  Objective:   Vitals:   01/12/23 0800 01/12/23 0900 01/12/23 1000 01/12/23 1100  BP:   115/60   Pulse: (!) 47 (!) 102 94   Resp: 14 17 (!) 0   Temp:    98.4 F (36.9 C)  TempSrc:    Oral  SpO2: 93% 99% 99%   Weight:      Height:        Intake/Output Summary (Last 24 hours) at 01/12/2023 1339 Last data filed at 01/12/2023 9373 Gross per 24 hour  Intake 250 ml   Output 750 ml  Net -500 ml   Filed Weights   01/10/23 0500 01/11/23 0500 01/12/23 0631  Weight: 90.1 kg 91.3 kg 90.5 kg    Physical examination:   General:  AAO x 3,  cooperative, no distress;   HEENT:  Normocephalic, PERRL, otherwise with in Normal limits   Neuro:  CNII-XII intact. , normal motor and sensation, reflexes intact   Lungs:   Clear to auscultation BL, Respirations unlabored,  No wheezes / crackles  Cardio:    S1/S2, RRR, No murmure, No Rubs or Gallops   Abdomen:  Soft, non-tender, bowel  sounds active all four quadrants, no guarding or peritoneal signs.  Muscular  skeletal:  Limited exam -global generalized weaknesses - in bed, able to move all 4 extremities,   2+ pulses,  symmetric, No pitting edema  Skin:  Dry, warm to touch, negative for any Rashes,  Wounds: Please see nursing documentation   GU-improving penile and Scrotal Edema MSK--Rt arm PICC Line in situ Wounds: Please see nursing documentation  LABs:     Latest Ref Rng & Units 01/11/2023    4:09 AM 01/10/2023    6:06 AM 01/09/2023    9:17 AM  CBC  WBC 4.0 - 10.5 K/uL 9.4  9.8  9.1   Hemoglobin 13.0 - 17.0 g/dL 8.5  8.7  8.9   Hematocrit 39.0 - 52.0 % 26.3  27.2  26.7   Platelets 150 - 400 K/uL 363  378  356       Latest Ref Rng & Units 01/12/2023    4:49 AM 01/11/2023    4:09 AM 01/10/2023    6:06 AM  CMP  Glucose 70 - 99 mg/dL 895  894  887   BUN 8 - 23 mg/dL 19  16  16    Creatinine 0.61 - 1.24 mg/dL 8.86  8.89  8.82   Sodium 135 - 145 mmol/L 131  136  135   Potassium 3.5 - 5.1 mmol/L 3.6  3.8  3.4   Chloride 98 - 111 mmol/L 86  88  86   CO2 22 - 32 mmol/L 40  38  39   Calcium 8.9 - 10.3 mg/dL 8.7  9.0  9.2    Micro Results Recent Results (from the past 240 hours)  Culture, blood (Routine X 2) w Reflex to ID Panel     Status: None   Collection Time: 01/04/23  4:19 PM   Specimen: BLOOD LEFT HAND  Result Value Ref Range Status   Specimen Description BLOOD LEFT HAND  Final   Special Requests    Final    BOTTLES DRAWN AEROBIC AND ANAEROBIC Blood Culture adequate volume   Culture   Final    NO GROWTH 5 DAYS Performed at [facility], [address] [place], [place] [address]    Report Status 01/09/2023 FINAL  Final  Culture, blood (Routine X 2) w Reflex to ID Panel     Status: None   Collection Time: 01/04/23  4:19 PM   Specimen: BLOOD LEFT WRIST  Result Value Ref Range Status   Specimen Description BLOOD LEFT WRIST  Final   Special Requests   Final    BOTTLES DRAWN AEROBIC AND ANAEROBIC Blood Culture adequate volume   Culture   Final    NO GROWTH 5 DAYS Performed at [facility], [address] [place], [place] [address]    Report Status 01/09/2023 FINAL  Final  Resp panel by RT-PCR (RSV, Flu A&B, Covid) Anterior Nasal Swab     Status: None   Collection Time: 01/11/23  8:50 PM   Specimen: Anterior Nasal Swab  Result Value Ref Range Status   SARS Coronavirus 2 by RT PCR NEGATIVE NEGATIVE Final    Comment: (NOTE) SARS-CoV-2 target nucleic acids are NOT DETECTED.  The SARS-CoV-2 RNA is generally detectable in upper respiratory specimens during the acute phase of infection. The lowest concentration of SARS-CoV-2 viral copies this assay can detect is 138 copies/mL. A negative result does not preclude SARS-Cov-2 infection and should not be used as the sole basis for treatment or other patient  management decisions. A negative result may occur with  improper specimen collection/handling, submission of specimen other than nasopharyngeal swab, presence of viral mutation(s) within the areas targeted by this assay, and inadequate number of viral copies(<138 copies/mL). A negative result must be combined with clinical observations, patient history, and epidemiological information. The expected result is Negative.  Fact Sheet for Patients:  bloggercourse.com  Fact Sheet for Healthcare Providers:  seriousbroker.it  This test is  no t yet approved or cleared by the [place]  FDA and  has been authorized for detection and/or diagnosis of SARS-CoV-2 by FDA under an Emergency Use Authorization (EUA). This EUA will remain  in effect (meaning this test can be used) for the duration of the COVID-19 declaration under Section 564(b)(1) of the Act, 21 U.S.C.section 360bbb-3(b)(1), unless the authorization is terminated  or revoked sooner.       Influenza A by PCR NEGATIVE NEGATIVE Final   Influenza B by PCR NEGATIVE NEGATIVE Final    Comment: (NOTE) The Xpert Xpress SARS-CoV-2/FLU/RSV plus assay is intended as an aid in the diagnosis of influenza from Nasopharyngeal swab specimens and should not be used as a sole basis for treatment. Nasal washings and aspirates are unacceptable for Xpert Xpress SARS-CoV-2/FLU/RSV testing.  Fact Sheet for Patients: bloggercourse.com  Fact Sheet for Healthcare Providers: seriousbroker.it  This test is not yet approved or cleared by the [place]  FDA and has been authorized for detection and/or diagnosis of SARS-CoV-2 by FDA under an Emergency Use Authorization (EUA). This EUA will remain in effect (meaning this test can be used) for the duration of the COVID-19 declaration under Section 564(b)(1) of the Act, 21 U.S.C. section 360bbb-3(b)(1), unless the authorization is terminated or revoked.     Resp Syncytial Virus by PCR NEGATIVE NEGATIVE Final    Comment: (NOTE) Fact Sheet for Patients: bloggercourse.com  Fact Sheet for Healthcare Providers: seriousbroker.it  This test is not yet approved or cleared by the [place]  FDA and has been authorized for detection and/or diagnosis of SARS-CoV-2 by FDA under an Emergency Use Authorization (EUA). This EUA will remain in effect (meaning this test can be used) for the duration of the COVID-19 declaration under Section  564(b)(1) of the Act, 21 U.S.C. section 360bbb-3(b)(1), unless the authorization is terminated or revoked.  Performed at [facility], [address] [place], [place] [address]    SIGNED: Adriana DELENA Grams, MD,  - [facility] Critical care time spent 55 minutes [facility],  Pager (please use [email or web address] to page/ text) Please use Epic Secure Chat for non-urgent communication (7AM-7PM)  If 7PM-7AM, please contact night-coverage [email or web address], 01/12/2023, 1:39 PM

## 2023-01-12 NOTE — Plan of Care (Signed)

## 2023-01-13 ENCOUNTER — Inpatient Hospital Stay (HOSPITAL_COMMUNITY): Payer: Medicare Other

## 2023-01-13 DIAGNOSIS — R7989 Other specified abnormal findings of blood chemistry: Secondary | ICD-10-CM

## 2023-01-13 DIAGNOSIS — I4891 Unspecified atrial fibrillation: Secondary | ICD-10-CM | POA: Diagnosis not present

## 2023-01-13 DIAGNOSIS — F17200 Nicotine dependence, unspecified, uncomplicated: Secondary | ICD-10-CM | POA: Diagnosis not present

## 2023-01-13 DIAGNOSIS — I5031 Acute diastolic (congestive) heart failure: Secondary | ICD-10-CM | POA: Insufficient documentation

## 2023-01-13 LAB — URIC ACID: Uric Acid, Serum: 9.5 mg/dL — ABNORMAL HIGH (ref 3.7–8.6)

## 2023-01-13 LAB — BASIC METABOLIC PANEL
Anion gap: 10 (ref 5–15)
BUN: 20 mg/dL (ref 8–23)
CO2: 38 mmol/L — ABNORMAL HIGH (ref 22–32)
Calcium: 8.9 mg/dL (ref 8.9–10.3)
Chloride: 87 mmol/L — ABNORMAL LOW (ref 98–111)
Creatinine, Ser: 1.23 mg/dL (ref 0.61–1.24)
GFR, Estimated: >60 mL/min (ref >60)
Glucose, Bld: 104 mg/dL — ABNORMAL HIGH (ref 70–99)
Potassium: 3.5 mmol/L (ref 3.5–5.1)
Sodium: 135 mmol/L (ref 135–145)

## 2023-01-13 LAB — PROCALCITONIN: Procalcitonin: <0.1 ng/mL

## 2023-01-13 LAB — CBC
HCT: 25.7 % — ABNORMAL LOW (ref 39.0–52.0)
Hemoglobin: 8.3 g/dL — ABNORMAL LOW (ref 13.0–17.0)
MCH: 33.9 pg (ref 26.0–34.0)
MCHC: 32.3 g/dL (ref 30.0–36.0)
MCV: 104.9 fL — ABNORMAL HIGH (ref 80.0–100.0)
Platelets: 412 10*3/uL — ABNORMAL HIGH (ref 150–400)
RBC: 2.45 MIL/uL — ABNORMAL LOW (ref 4.22–5.81)
RDW: 19.3 % — ABNORMAL HIGH (ref 11.5–15.5)
WBC: 10.8 10*3/uL — ABNORMAL HIGH (ref 4.0–10.5)
nRBC: 0 % (ref 0.0–0.2)

## 2023-01-13 MED ORDER — AMIODARONE LOAD VIA INFUSION
150.0000 mg | Freq: Once | INTRAVENOUS | Status: AC
  Administered 2023-01-13: 150 mg via INTRAVENOUS
  Filled 2023-01-13: qty 83.34

## 2023-01-13 NOTE — Progress Notes (Signed)
 PROGRESS NOTE  Muaaz Brau Malan FMW:[id number] DOB: 1960-06-08 DOA: 12/29/2022 PCP: Patient, No Pcp Per  Brief History:  History limited due to patient's intoxication/AMS, obtained by patient, chart review, EDP Mohannad Olivero Dunivan is a 63 y.o. male with hx of alcohol abuse, hypertension, mood disorder, smoking, GERD, who was brought in after bystander contacted EMS because he was sitting in his car in the parking lot.   Found have alcohol intoxication, A-fib with RVR, suspected aspiration pneumonia, and worsening lactic acidosis with fluids.  Clinically suspect decompensated heart failure.  Denies any known history of A-fib.   EKG:  A-fib with RVR rate 159, LAD, LAFB, inferior Q's, poor R wave progression, T wave inversion laterally, no overt ischemic changes.    ED Course:  Found to be in A-fib with RVR, initially treated with metoprolol  2.5 mg IV x 1 with slight worsening of pressures to systolic in the 90s.  Started on heparin  for anticoagulation.  Treated with ceftriaxone /azithromycin , 2 L IV fluid, potassium 40 mg IV and mag 4 g.  At my request consulted with cardiology fellow re: rate control options.  Cardiology ultimately placed the patient on IV amiodarone .  Because of his soft blood pressures, his heart rates have been difficult to control.  The patient received intermittent boluses of amiodarone .  GI was consulted also secondary to heme positive stool and dropping hemoglobin.  EGD was performed and showed gastritis.  The patient refused colonoscopy during hospitalization.        Assessment/Plan: Acute HFpEF -12/29/2022 echo EF 55-60%, no WMA, trivial MR -Continue furosemide  -Appreciate cardiology --10.2 L since admission -Serum creatinine stable  New onset atrial fibrillation with RVR, type unspecified -Initially on diltiazem  drip -Difficult to manage rate secondary to soft BPs -Apixaban  has been started as hemoglobin remained stable levels--started 01/09/2023   -Continue midodrine  -Transition to oral amiodarone  per cardiology --Not a candidate for cardioversion as to be difficult to commit to long-term anticoagulation post cardioversion in the setting of GI bleed without endoluminal evaluation   Fever of unknown origin -UA negative for pyuria -Personally reviewed chest x-ray--no consolidations -Blood culture x 2 sets -COVID-19 PCR negative -check RVP -uric acid  Acute respiratory failure with hypoxia -Secondary to CHF -Stable on 2 L  Alcohol intoxication -No sign of withdrawal presently -Initially on CIWA -Continue thiamine  and multivitamin -Reports drinking up to 1.75 L of liquor a day, no known history of severe withdrawal in the past /seizure/DT.  Acute anemia with FOBT positive -01/08/2023 EGD--gastritis, normal duodenum -Patient refused colonoscopy -Hemoglobin remains largely stable -Monitor hemoglobin on apixaban  -No overt signs of bleeding at this time  Capacity evaluation----per Dr. Rendall Sinner: Earlier on tn this admission Patient was found incompetent due to intermittent confusion and forgetfulness He Discussed it with sisters ---[name] and [name] as well as [name] and [name]--  at bedside--- they have offered to make medical decisions for patient   - TSH 2.7  - Folate 3.6, B12 -158, - HIV, RPR>> nonreactive -On 01/12/2023, the patient was deemed to have capacity to make decisions  Low B12 /folic acid  -Repleting        Family Communication:   no Family at bedside  Consultants:  cardiology  Code Status:  DNR  DVT Prophylaxis: apixaban    Procedures: As Listed in Progress Note Above  Antibiotics: None       Subjective: Patient denies fevers, chills, headache, chest pain, dyspnea, nausea, vomiting, diarrhea, abdominal  pain, dysuria, hematuria, hematochezia, and melena.   Objective: Vitals:   01/13/23 1300 01/13/23 1400 01/13/23 1435 01/13/23 1500  BP: 112/87 (!) 88/64  108/88 94/65  Pulse: 98 92 74 89  Resp: 16 15  19   Temp:      TempSrc:      SpO2: 100% 100% 100% 99%  Weight:      Height:        Intake/Output Summary (Last 24 hours) at 01/13/2023 1530 Last data filed at 01/13/2023 1300 Gross per 24 hour  Intake 1062.25 ml  Output 1300 ml  Net -237.75 ml   Weight change: 2 kg Exam:  General:  Pt is alert, follows commands appropriately, not in acute distress HEENT: No icterus, No thrush, No neck mass, [place]/AT Cardiovascular: IRRR, S1/S2, no rubs, no gallops Respiratory: Bibasilar rales.  No wheezing Abdomen: Soft/+BS, non tender, non distended, no guarding Extremities: 1 + LE edema, No lymphangitis, No petechiae, No rashes, no synovitis   Data Reviewed: I have personally reviewed following labs and imaging studies Basic Metabolic Panel: Recent Labs  Lab 01/07/23 0409 01/08/23 0303 01/09/23 9376 01/10/23 0606 01/10/23 0755 01/11/23 0409 01/12/23 0449 01/13/23 0418  NA 129*   < > 133* 135  --  136 131* 135  K 3.8   < > 3.4* 3.4*  --  3.8 3.6 3.5  CL 82*   < > 85* 86*  --  88* 86* 87*  CO2 38*   < > 39* 39*  --  38* 40* 38*  GLUCOSE 113*   < > 138* 112*  --  105* 104* 104*  BUN 11   < > 16 16  --  16 19 20   CREATININE 0.88   < > 1.10 1.17  --  1.10 1.13 1.23  CALCIUM 8.7*   < > 8.9 9.2  --  9.0 8.7* 8.9  MG 1.4*  --   --   --  2.0  --   --   --   PHOS 3.5  --   --   --   --   --   --   --    < > = values in this interval not displayed.   Liver Function Tests: Recent Labs  Lab 01/07/23 0409 01/08/23 0303  AST  --  14*  ALT  --  10  ALKPHOS  --  48  BILITOT  --  1.2  PROT  --  6.2*  ALBUMIN  2.4* 2.6*   No results for input(s): LIPASE, AMYLASE in the last 168 hours. No results for input(s): AMMONIA in the last 168 hours. Coagulation Profile: No results for input(s): INR, PROTIME in the last 168 hours. CBC: Recent Labs  Lab 01/08/23 0303 01/09/23 0917 01/10/23 0606 01/11/23 0409 01/13/23 0418  WBC 7.7 9.1  9.8 9.4 10.8*  HGB 9.0* 8.9* 8.7* 8.5* 8.3*  HCT 26.2* 26.7* 27.2* 26.3* 25.7*  MCV 101.9* 103.9* 103.8* 105.2* 104.9*  PLT 154 356 378 363 412*   Cardiac Enzymes: No results for input(s): CKTOTAL, CKMB, CKMBINDEX, TROPONINI in the last 168 hours. BNP: Invalid input(s): POCBNP CBG: No results for input(s): GLUCAP in the last 168 hours. HbA1C: No results for input(s): HGBA1C in the last 72 hours. Urine analysis:    Component Value Date/Time   COLORURINE YELLOW 01/12/2023 0053   APPEARANCEUR CLEAR 01/12/2023 0053   LABSPEC 1.019 01/12/2023 0053   PHURINE 5.0 01/12/2023 0053   GLUCOSEU NEGATIVE 01/12/2023 0053   HGBUR NEGATIVE 01/12/2023 0053  BILIRUBINUR NEGATIVE 01/12/2023 0053   KETONESUR NEGATIVE 01/12/2023 0053   PROTEINUR 30 (A) 01/12/2023 0053   NITRITE NEGATIVE 01/12/2023 0053   LEUKOCYTESUR NEGATIVE 01/12/2023 0053   Sepsis Labs: @LABRCNTIP (procalcitonin:4,lacticidven:4) ) Recent Results (from the past 240 hours)  Culture, blood (Routine X 2) w Reflex to ID Panel     Status: None   Collection Time: 01/04/23  4:19 PM   Specimen: BLOOD LEFT HAND  Result Value Ref Range Status   Specimen Description BLOOD LEFT HAND  Final   Special Requests   Final    BOTTLES DRAWN AEROBIC AND ANAEROBIC Blood Culture adequate volume   Culture   Final    NO GROWTH 5 DAYS Performed at [facility], [address] [place], [place] [address]    Report Status 01/09/2023 FINAL  Final  Culture, blood (Routine X 2) w Reflex to ID Panel     Status: None   Collection Time: 01/04/23  4:19 PM   Specimen: BLOOD LEFT WRIST  Result Value Ref Range Status   Specimen Description BLOOD LEFT WRIST  Final   Special Requests   Final    BOTTLES DRAWN AEROBIC AND ANAEROBIC Blood Culture adequate volume   Culture   Final    NO GROWTH 5 DAYS Performed at [facility], [address], [place], [place] [address]    Report Status 01/09/2023 FINAL  Final  Resp panel by RT-PCR (RSV, Flu  A&B, Covid) Anterior Nasal Swab     Status: None   Collection Time: 01/11/23  8:50 PM   Specimen: Anterior Nasal Swab  Result Value Ref Range Status   SARS Coronavirus 2 by RT PCR NEGATIVE NEGATIVE Final    Comment: (NOTE) SARS-CoV-2 target nucleic acids are NOT DETECTED.  The SARS-CoV-2 RNA is generally detectable in upper respiratory specimens during the acute phase of infection. The lowest concentration of SARS-CoV-2 viral copies this assay can detect is 138 copies/mL. A negative result does not preclude SARS-Cov-2 infection and should not be used as the sole basis for treatment or other patient management decisions. A negative result may occur with  improper specimen collection/handling, submission of specimen other than nasopharyngeal swab, presence of viral mutation(s) within the areas targeted by this assay, and inadequate number of viral copies(<138 copies/mL). A negative result must be combined with clinical observations, patient history, and epidemiological information. The expected result is Negative.  Fact Sheet for Patients:  bloggercourse.com  Fact Sheet for Healthcare Providers:  seriousbroker.it  This test is no t yet approved or cleared by the [place]  FDA and  has been authorized for detection and/or diagnosis of SARS-CoV-2 by FDA under an Emergency Use Authorization (EUA). This EUA will remain  in effect (meaning this test can be used) for the duration of the COVID-19 declaration under Section 564(b)(1) of the Act, 21 U.S.C.section 360bbb-3(b)(1), unless the authorization is terminated  or revoked sooner.       Influenza A by PCR NEGATIVE NEGATIVE Final   Influenza B by PCR NEGATIVE NEGATIVE Final    Comment: (NOTE) The Xpert Xpress SARS-CoV-2/FLU/RSV plus assay is intended as an aid in the diagnosis of influenza from Nasopharyngeal swab specimens and should not be used as a sole basis for treatment.  Nasal washings and aspirates are unacceptable for Xpert Xpress SARS-CoV-2/FLU/RSV testing.  Fact Sheet for Patients: bloggercourse.com  Fact Sheet for Healthcare Providers: seriousbroker.it  This test is not yet approved or cleared by the [place]  FDA and has been authorized for detection and/or  diagnosis of SARS-CoV-2 by FDA under an Emergency Use Authorization (EUA). This EUA will remain in effect (meaning this test can be used) for the duration of the COVID-19 declaration under Section 564(b)(1) of the Act, 21 U.S.C. section 360bbb-3(b)(1), unless the authorization is terminated or revoked.     Resp Syncytial Virus by PCR NEGATIVE NEGATIVE Final    Comment: (NOTE) Fact Sheet for Patients: bloggercourse.com  Fact Sheet for Healthcare Providers: seriousbroker.it  This test is not yet approved or cleared by the [place]  FDA and has been authorized for detection and/or diagnosis of SARS-CoV-2 by FDA under an Emergency Use Authorization (EUA). This EUA will remain in effect (meaning this test can be used) for the duration of the COVID-19 declaration under Section 564(b)(1) of the Act, 21 U.S.C. section 360bbb-3(b)(1), unless the authorization is terminated or revoked.  Performed at [facility], [address], [place], [place] [address]      Scheduled Meds:  apixaban   5 mg Oral BID   cyanocobalamin   1,000 mcg Intramuscular Once   vitamin B-12  1,000 mcg Oral Daily   docusate sodium   200 mg Oral Daily   ferrous sulfate   325 mg Oral BID WC   folic acid   1 mg Oral Daily   furosemide   40 mg Oral Daily   midodrine   15 mg Oral TID WC   multivitamin with minerals  1 tablet Oral Daily   pantoprazole  (PROTONIX ) IV  40 mg Intravenous Q12H   polyethylene glycol  17 g Oral Daily   sodium chloride  flush  10-40 mL Intracatheter Q12H   sodium chloride  flush  3 mL Intravenous  Q12H   sucralfate   1 g Oral TID WC & HS   thiamine   100 mg Oral Daily   Continuous Infusions:  amiodarone  30 mg/hr (01/13/23 0425)    Procedures/Studies: DG CHEST PORT 1 VIEW Result Date: 01/11/2023 CLINICAL DATA:  Fevers EXAM: PORTABLE CHEST 1 VIEW COMPARISON:  01/08/2023 FINDINGS: Cardiac shadow is within normal limits. Lungs are well aerated bilaterally. Elevation of the left hemidiaphragm is seen. Right PICC is seen with the catheter tip in the superior right atrium. This is stable from the prior study. No other focal abnormality is seen. IMPRESSION: No change from the previous day. Electronically Signed   By: Oneil Devonshire M.D.   On: 01/11/2023 20:47   DG CHEST PORT 1 VIEW Result Date: 01/08/2023 CLINICAL DATA:  Dyspnea, respiratory abnormalities. EXAM: PORTABLE CHEST 1 VIEW COMPARISON:  X-ray 01/05/2023 and older. FINDINGS: Underinflation. Slight elevation of the left hemidiaphragm. Stable enlarged cardiopericardial silhouette with a tortuous and ectatic aorta. Tiny pleural effusions. Bandlike changes at the bases. No pneumothorax. Overlapping cardiac leads. Right-sided PICC with the tip along the SVC right atrial junction. Overall opacities are slightly improving from previous. IMPRESSION: Slight interval reduction of the lung base opacities and effusions with some residual. Recommend continued follow up. Decreasing vascular congestion. Electronically Signed   By: Ranell Bring M.D.   On: 01/08/2023 11:27   DG CHEST PORT 1 VIEW Result Date: 01/05/2023 CLINICAL DATA:  Dyspnea. EXAM: PORTABLE CHEST 1 VIEW COMPARISON:  January 03, 2023. FINDINGS: Stable cardiomegaly. Mild central pulmonary vascular congestion is noted. Hypoinflation of the lungs is noted with bibasilar atelectasis or edema with associated effusions. Bony thorax is unremarkable. Right-sided PICC line is unchanged. IMPRESSION: Stable cardiomegaly with mild central pulmonary vascular congestion. Hypoinflation of the lungs is noted  with bibasilar atelectasis or edema with associated effusions. Electronically Signed   By: Lynwood  Landy Raddle M.D.   On: 01/05/2023 10:50   US  Abdomen Limited RUQ (LIVER/GB) Result Date: 01/04/2023 CLINICAL DATA:  Liver disease. EXAM: ULTRASOUND ABDOMEN LIMITED RIGHT UPPER QUADRANT COMPARISON:  Ultrasound 2016 FINDINGS: Gallbladder: Gallbladder is underdistended. Sludge and stones suggested. The wall thickening of the gallbladder could relate to the patient's history of chronic liver disease in the contracted state. Evaluation for Murphy's sign is limited as the patient could not responding to commands. Common bile duct: Diameter: 4 mm Liver: Echogenic hepatic parenchyma consistent with fatty liver infiltration. Please correlate for known history of chronic liver disease. If there is concern further of underlying mass lesion additional workup with a contrast study is recommended. Portal vein is patent on color Doppler imaging with normal direction of blood flow towards the liver. Other: Mild ascites. IMPRESSION: Echogenic liver.  Mild ascites. Contracted gallbladder with stones and sludge. No ductal dilatation. Electronically Signed   By: Ranell Bring M.D.   On: 01/04/2023 15:38   DG CHEST PORT 1 VIEW Result Date: 01/03/2023 CLINICAL DATA:  [id number] Dyspnea and respiratory abnormalities [id number] EXAM: PORTABLE CHEST 1 VIEW COMPARISON:  12/30/2022. FINDINGS: Low lung volume. Mild-to-moderate diffuse pulmonary vascular congestion without significant interval change. There are bilateral small layering pleural effusions with probable associated compressive atelectatic changes. No pneumothorax. Stable cardio-mediastinal silhouette. No acute osseous abnormalities. The soft tissues are within normal limits. Right-sided PICC line is seen with its tip overlying the cavoatrial junction region. IMPRESSION: *Findings favor congestive heart failure/pulmonary edema, without significant interval change. Electronically Signed    By: Ree Molt M.D.   On: 01/03/2023 08:54   CT Angio Chest Pulmonary Embolism (PE) W or WO Contrast Result Date: 12/31/2022 CLINICAL DATA:  Elevated D-dimer and peripheral swelling EXAM: CT ANGIOGRAPHY CHEST WITH CONTRAST TECHNIQUE: Multidetector CT imaging of the chest was performed using the standard protocol during bolus administration of intravenous contrast. Multiplanar CT image reconstructions and MIPs were obtained to evaluate the vascular anatomy. RADIATION DOSE REDUCTION: This exam was performed according to the departmental dose-optimization program which includes automated exposure control, adjustment of the mA and/or kV according to patient size and/or use of iterative reconstruction technique. CONTRAST:  OMNIPAQUE  IOHEXOL  350 MG/ML SOLN COMPARISON:  Chest x-ray from the previous day. FINDINGS: Cardiovascular: Thoracic aorta shows a normal branching pattern. No aneurysmal dilatation or dissection is noted. Heart is at the upper limits of normal in size. The pulmonary artery shows a normal branching pattern bilaterally. No filling defect to suggest pulmonary embolism is noted. Coronary calcifications are seen. Right PICC is noted. Mediastinum/Nodes: Thoracic inlet is within normal limits. No hilar or mediastinal adenopathy is noted. The esophagus as visualized is within normal limits. Lungs/Pleura: Small left-sided pleural effusion is noted. Tiny right-sided pleural effusion is noted with the predominant lateral component. Bilateral lower lobe atelectasis is seen. No focal confluent infiltrate is noted. No parenchymal nodules are noted. Upper Abdomen: No acute abnormality. Musculoskeletal: Degenerative changes of the thoracic spine are noted. Review of the MIP images confirms the above findings. IMPRESSION: Small pleural effusions, left greater than right with lower lobe atelectasis. No evidence of pulmonary emboli. Electronically Signed   By: Oneil Devonshire M.D.   On: 12/31/2022 18:53   US   Venous Img Lower Bilateral (DVT) Result Date: 12/31/2022 CLINICAL DATA:  Lower extremity swelling EXAM: BILATERAL LOWER EXTREMITY VENOUS DOPPLER ULTRASOUND TECHNIQUE: Gray-scale sonography with graded compression, as well as color Doppler and duplex ultrasound were performed to evaluate the lower extremity deep venous systems from the  level of the common femoral vein and including the common femoral, femoral, profunda femoral, popliteal and calf veins including the posterior tibial, peroneal and gastrocnemius veins when visible. The superficial great saphenous vein was also interrogated. Spectral Doppler was utilized to evaluate flow at rest and with distal augmentation maneuvers in the common femoral, femoral and popliteal veins. COMPARISON:  None Available. FINDINGS: RIGHT LOWER EXTREMITY Common Femoral Vein: No evidence of thrombus. Normal compressibility, respiratory phasicity and response to augmentation. Saphenofemoral Junction: No evidence of thrombus. Normal compressibility and flow on color Doppler imaging. Profunda Femoral Vein: No evidence of thrombus. Normal compressibility and flow on color Doppler imaging. Femoral Vein: No evidence of thrombus. Normal compressibility, respiratory phasicity and response to augmentation. Popliteal Vein: No evidence of thrombus. Normal compressibility, respiratory phasicity and response to augmentation. Calf Veins: No evidence of thrombus. Normal compressibility and flow on color Doppler imaging. Superficial Great Saphenous Vein: No evidence of thrombus. Normal compressibility and flow on color Doppler imaging. Venous Reflux:  None. Other Findings:  Lower extremity edema is noted. LEFT LOWER EXTREMITY Common Femoral Vein: No evidence of thrombus. Normal compressibility, respiratory phasicity and response to augmentation. Saphenofemoral Junction: No evidence of thrombus. Normal compressibility and flow on color Doppler imaging. Profunda Femoral Vein: No evidence of  thrombus. Normal compressibility and flow on color Doppler imaging. Femoral Vein: No evidence of thrombus. Normal compressibility, respiratory phasicity and response to augmentation. Popliteal Vein: No evidence of thrombus. Normal compressibility, respiratory phasicity and response to augmentation. Calf Veins: No evidence of thrombus. Normal compressibility and flow on color Doppler imaging. Superficial Great Saphenous Vein: No evidence of thrombus. Normal compressibility and flow on color Doppler imaging. Venous Reflux:  None. Other Findings:  Lower extremity edema is noted. IMPRESSION: No evidence of deep venous thrombosis. Electronically Signed   By: Oneil Devonshire M.D.   On: 12/31/2022 18:49   ECHOCARDIOGRAM COMPLETE Result Date: 12/31/2022    ECHOCARDIOGRAM REPORT   Patient Name:   TAVAUGHN SILGUERO Date of Exam: 12/30/2022 Medical Rec #:  [id number]        Height:       70.0 in Accession #:    [id number]       Weight:       218.0 lb Date of Birth:  23-Mar-1960        BSA:          2.165 m Patient Age:    62 years         BP:           119/81 mmHg Patient Gender: M                HR:           94 bpm. Exam Location:  [facility] Procedure: 2D Echo, Cardiac Doppler, Color Doppler and Intracardiac            Opacification Agent Indications:    Congestive Heart Failure I50.9  History:        Patient has no prior history of Echocardiogram examinations.                 Risk Factors:Hypertension and Current Smoker.  Sonographer:    Aida Pizza RCS Referring Phys: [id number] [facility]  Sonographer Comments: Technically difficult study due to poor echo windows. IMPRESSIONS  1. Left ventricular ejection fraction, by estimation, is 55 to 60%. The left ventricle has normal function. The left ventricle has no regional wall motion abnormalities. There is mild concentric left ventricular  hypertrophy. Left ventricular diastolic parameters are indeterminate.  2. Right ventricular systolic function was not well visualized.  The right ventricular size is not well visualized. Tricuspid regurgitation signal is inadequate for assessing PA pressure.  3. Left atrial size was severely dilated.  4. Right atrial size was moderately dilated.  5. The mitral valve is grossly normal. Trivial mitral valve regurgitation.  6. The aortic valve was not well visualized. Aortic valve regurgitation is not visualized.  7. The inferior vena cava is dilated in size with <50% respiratory variability, suggesting right atrial pressure of 15 mmHg. Comparison(s): No prior Echocardiogram. FINDINGS  Left Ventricle: Left ventricular ejection fraction, by estimation, is 55 to 60%. The left ventricle has normal function. The left ventricle has no regional wall motion abnormalities. Definity  contrast agent was given IV to delineate the left ventricular  endocardial borders. The left ventricular internal cavity size was normal in size. There is mild concentric left ventricular hypertrophy. Left ventricular diastolic function could not be evaluated due to atrial fibrillation. Left ventricular diastolic parameters are indeterminate. Right Ventricle: The right ventricular size is not well visualized. Right vetricular wall thickness was not well visualized. Right ventricular systolic function was not well visualized. Tricuspid regurgitation signal is inadequate for assessing PA pressure. Left Atrium: Left atrial size was severely dilated. Right Atrium: Right atrial size was moderately dilated. Pericardium: There is no evidence of pericardial effusion. Mitral Valve: The mitral valve is grossly normal. Trivial mitral valve regurgitation. Tricuspid Valve: The tricuspid valve is grossly normal. Tricuspid valve regurgitation is trivial. Aortic Valve: The aortic valve was not well visualized. Aortic valve regurgitation is not visualized. Pulmonic Valve: The pulmonic valve was not well visualized. Pulmonic valve regurgitation is not visualized. Aorta: The aortic root is normal in  size and structure. Venous: The inferior vena cava is dilated in size with less than 50% respiratory variability, suggesting right atrial pressure of 15 mmHg. IAS/Shunts: No atrial level shunt detected by color flow Doppler.  LEFT VENTRICLE PLAX 2D LVIDd:         3.40 cm LVIDs:         2.40 cm LV PW:         1.10 cm LV IVS:        1.10 cm LVOT diam:     1.80 cm LV SV:         44 LV SV Index:   20 LVOT Area:     2.54 cm  RIGHT VENTRICLE TAPSE (M-mode): 1.2 cm LEFT ATRIUM              Index        RIGHT ATRIUM           Index LA diam:        2.80 cm  1.29 cm/m   RA Area:     26.20 cm LA Vol (A2C):   115.0 ml 53.11 ml/m  RA Volume:   90.00 ml  41.56 ml/m LA Vol (A4C):   112.0 ml 51.72 ml/m LA Biplane Vol: 119.0 ml 54.96 ml/m  AORTIC VALVE LVOT Vmax:   97.50 cm/s LVOT Vmean:  72.800 cm/s LVOT VTI:    0.173 m  AORTA Ao Root diam: 3.10 cm MITRAL VALVE MV Area (PHT): 4.21 cm     SHUNTS MV Decel Time: 180 msec     Systemic VTI:  0.17 m MV E velocity: 127.00 cm/s  Systemic Diam: 1.80 cm Jayson Sierras MD Electronically signed by Jayson Sierras MD Signature Date/Time: 12/31/2022/1:36:31 PM  Final    DG CHEST PORT 1 VIEW Result Date: 12/30/2022 CLINICAL DATA:  PICC line placement. EXAM: PORTABLE CHEST 1 VIEW COMPARISON:  One-view chest x-ray 12/29/2022 FINDINGS: Heart is enlarged. Right pleural effusion remains. Mild pulmonary vascular congestion is present bilaterally. A new right-sided PICC line is in place. The tip is at the cavoatrial junction. No pneumothorax is present. The visualized soft tissues and bony thorax are otherwise unremarkable. IMPRESSION: 1. New right-sided PICC line with tip at the cavoatrial junction. 2. Cardiomegaly and mild pulmonary vascular congestion. 3. Persistent right pleural effusion. Electronically Signed   By: Lonni Necessary M.D.   On: 12/30/2022 16:25   US  EKG SITE RITE Result Date: 12/30/2022 If Site Rite image not attached, placement could not be confirmed due to  current cardiac rhythm.  DG Chest Port 1 View Result Date: 12/29/2022 CLINICAL DATA:  Altered mental status. EXAM: PORTABLE CHEST 1 VIEW COMPARISON:  11/05/2014 FINDINGS: Extremely shallow inspiration. Heart size and pulmonary vascularity are probably normal for technique. Suggestion of a right pleural effusion with basilar atelectasis or infiltration. No visible pneumothorax. IMPRESSION: Shallow inspiration. Right pleural effusion with basilar atelectasis or infiltration. Electronically Signed   By: Elsie Gravely M.D.   On: 12/29/2022 23:33   CT Head Wo Contrast Result Date: 12/29/2022 CLINICAL DATA:  Mental status change, unknown cause EXAM: CT HEAD WITHOUT CONTRAST TECHNIQUE: Contiguous axial images were obtained from the base of the skull through the vertex without intravenous contrast. RADIATION DOSE REDUCTION: This exam was performed according to the departmental dose-optimization program which includes automated exposure control, adjustment of the mA and/or kV according to patient size and/or use of iterative reconstruction technique. COMPARISON:  None Available. FINDINGS: Brain: Mild diffuse cerebral atrophy. No acute intracranial abnormality. Specifically, no hemorrhage, hydrocephalus, mass lesion, acute infarction, or significant intracranial injury. Vascular: No hyperdense vessel or unexpected calcification. Skull: No acute calvarial abnormality. Sinuses/Orbits: No acute findings Other: None IMPRESSION: No acute intracranial abnormality. Mild atrophy. Electronically Signed   By: Franky Crease M.D.   On: 12/29/2022 23:28    Alm Schneider, DO  [facility]  If 7PM-7AM, please contact night-coverage [email or web address] Password [email or web address] 01/13/2023, 3:30 PM   LOS: 14 days

## 2023-01-13 NOTE — Plan of Care (Signed)
  Problem: Education: Goal: Knowledge of General Education information will improve Description: Including pain rating scale, medication(s)/side effects and non-pharmacologic comfort measures Outcome: Progressing   Problem: Nutrition: Goal: Adequate nutrition will be maintained Outcome: Progressing   Problem: Coping: Goal: Level of anxiety will decrease Outcome: Progressing   Problem: Elimination: Goal: Will not experience complications related to bowel motility Outcome: Progressing   Problem: Pain Management: Goal: General experience of comfort will improve Outcome: Progressing   Problem: Safety: Goal: Ability to remain free from injury will improve Outcome: Progressing

## 2023-01-13 NOTE — Progress Notes (Signed)
 Physical Therapy Treatment Patient Details Name: Justin Fleming MRN: [id number] DOB: [birth year] Today's Date: 01/13/2023   History of Present Illness History limited due to patient's intoxication/AMS, obtained by patient, chart review, EDP  Mehdi Gironda Stenseth is a 63 y.o. male with hx of alcohol abuse, hypertension, mood disorder, smoking, GERD, who was brought in after bystander contacted EMS because he was sitting in his car in the parking lot.  Per triage found in his car with no heat on holding a bottle of liquor.  Initially was unable to provide any history.  At time of my interview appears more awake/interactive compared to ED evaluation.  He reports ongoing heavy alcohol use possibly as much is 1.75 L/day of liquor.  Denies any recent complaints.  However on review of systems reports bilateral lower extremity swelling, intermittent heart racing.  Denies any recent or active chest pain.  No recent fever/chills, cough/cold, abdominal pain, nausea, vomiting, diarrhea, dysuria, rashes, headache, focal numbness or weakness.  Denies any known history of A-fib    PT Comments  Pt lying in bed.  Refused to attempt bed mobility or transfer, however agreed to bil LE exercises.  Pt able to complete with reduced ROM all all motions.  Dorsiflexion highly restricted and with noted weakness.  Did not complete all activities as radiology came in for assessment.  Pt without any complaints of pain or issues and generally pleasant today.    If plan is discharge home, recommend the following: A lot of help with walking and/or transfers;Help with stairs or ramp for entrance;Assistance with cooking/housework;A lot of help with bathing/dressing/bathroom   Can travel by private vehicle     Yes  Equipment Recommendations  Rolling walker (2 wheels);BSC/3in1    Recommendations for Other Services       Precautions / Restrictions Precautions Precautions: None     Mobility  Bed Mobility  Not completed                   Transfers    Not completed                    Ambulation/Gait    Not completed                  Balance                                            Cognition Arousal: Alert Behavior During Therapy: WFL for tasks assessed/performed Overall Cognitive Status: Within Functional Limits for tasks assessed                                          Exercises General Exercises - Lower Extremity Ankle Circles/Pumps: AROM, Both, 10 reps, Supine Quad Sets: AROM, Both, 10 reps, Supine Heel Slides: AROM, Both, 10 reps, Supine Hip ABduction/ADduction: AROM, Both, 10 reps, Supine    General Comments        Pertinent Vitals/Pain Pain Assessment Pain Assessment: No/denies pain    Home Living                          Prior Function            PT Goals (current goals can now be found  in the care plan section) Acute Rehab PT Goals Patient Stated Goal: return home with family to assist PT Goal Formulation: With patient Time For Goal Achievement: 01/15/23 Potential to Achieve Goals: Fair    Frequency    Min 3X/week      PT Plan      Co-evaluation              AM-PAC PT 6 Clicks Mobility   Outcome Measure  Help needed turning from your back to your side while in a flat bed without using bedrails?: A Lot Help needed moving from lying on your back to sitting on the side of a flat bed without using bedrails?: A Lot Help needed moving to and from a bed to a chair (including a wheelchair)?: A Lot Help needed standing up from a chair using your arms (e.g., wheelchair or bedside chair)?: A Lot Help needed to walk in hospital room?: Total Help needed climbing 3-5 steps with a railing? : Total 6 Click Score: 10    End of Session Equipment Utilized During Treatment: Oxygen Activity Tolerance: Patient tolerated treatment well;Patient limited by fatigue Patient left: with call bell/phone within  reach;in bed   PT Visit Diagnosis: Unsteadiness on feet (R26.81);Other abnormalities of gait and mobility (R26.89);Muscle weakness (generalized) (M62.81)     Time: 8491-8479 PT Time Calculation (min) (ACUTE ONLY): 12 min  Charges:    $Therapeutic Exercise: 8-22 mins PT General Charges $$ ACUTE PT VISIT: 1 Visit                     Greig KATHEE Fuse, PTA/CLT [facility] Ph: [phone]    Fuse Greig KATHEE 01/13/2023, 3:26 PM

## 2023-01-13 NOTE — Progress Notes (Signed)
 Rounding Note    Patient Name: Justin Fleming Date of Encounter: 01/13/2023  [facility] Cardiologist: New  Subjective   No complaints  Inpatient Medications    Scheduled Meds:  apixaban   5 mg Oral BID   cyanocobalamin   1,000 mcg Intramuscular Once   vitamin B-12  1,000 mcg Oral Daily   docusate sodium   200 mg Oral Daily   ferrous sulfate   325 mg Oral BID WC   folic acid   1 mg Oral Daily   furosemide   40 mg Oral Daily   midodrine   15 mg Oral TID WC   multivitamin with minerals  1 tablet Oral Daily   pantoprazole  (PROTONIX ) IV  40 mg Intravenous Q12H   polyethylene glycol  17 g Oral Daily   sodium chloride  flush  10-40 mL Intracatheter Q12H   sodium chloride  flush  3 mL Intravenous Q12H   sucralfate   1 g Oral TID WC & HS   thiamine   100 mg Oral Daily   Continuous Infusions:  amiodarone  30 mg/hr (01/13/23 0425)   PRN Meds: acetaminophen , diphenhydrAMINE , HYDROmorphone  (DILAUDID ) injection, mouth rinse, oxyCODONE , sodium chloride  flush   Vital Signs    Vitals:   01/13/23 0300 01/13/23 0400 01/13/23 0417 01/13/23 0801  BP: 112/84 101/75    Pulse:  89    Resp: 14 (!) 3    Temp: 97.6 F (36.4 C)   97.8 F (36.6 C)  TempSrc:    Oral  SpO2:  90%    Weight:   92.5 kg   Height:        Intake/Output Summary (Last 24 hours) at 01/13/2023 0945 Last data filed at 01/13/2023 0915 Gross per 24 hour  Intake 1062.25 ml  Output 650 ml  Net 412.25 ml      01/13/2023    4:17 AM 01/12/2023    6:31 AM 01/11/2023    5:00 AM  Last 3 Weights  Weight (lbs) 203 lb 14.8 oz 199 lb 8.3 oz 201 lb 4.5 oz  Weight (kg) 92.5 kg 90.5 kg 91.3 kg      Telemetry    Afib rates 100s - Personally Reviewed  ECG    N/a - Personally Reviewed  Physical Exam   GEN: No acute distress.   Neck: No JVD Cardiac: irregular Respiratory: Clear to auscultation bilaterally. GI: Soft, nontender, non-distended  MS: No edema; No deformity. Neuro:  Nonfocal  Psych: Normal affect    Labs    High Sensitivity Troponin:   Recent Labs  Lab 12/30/22 0001 12/30/22 0239  TROPONINIHS 39* 34*     Chemistry Recent Labs  Lab 01/07/23 0409 01/08/23 0303 01/09/23 9376 01/10/23 0755 01/11/23 0409 01/12/23 0449 01/13/23 0418  NA 129* 130*   < >  --  136 131* 135  K 3.8 3.5   < >  --  3.8 3.6 3.5  CL 82* 83*   < >  --  88* 86* 87*  CO2 38* 38*   < >  --  38* 40* 38*  GLUCOSE 113* 107*   < >  --  105* 104* 104*  BUN 11 11   < >  --  16 19 20   CREATININE 0.88 0.89   < >  --  1.10 1.13 1.23  CALCIUM 8.7* 8.4*   < >  --  9.0 8.7* 8.9  MG 1.4*  --   --  2.0  --   --   --   PROT  --  6.2*  --   --   --   --   --   ALBUMIN  2.4* 2.6*  --   --   --   --   --   AST  --  14*  --   --   --   --   --   ALT  --  10  --   --   --   --   --   ALKPHOS  --  48  --   --   --   --   --   BILITOT  --  1.2  --   --   --   --   --   GFRNONAA >60 >60   < >  --  >60 >60 >60  ANIONGAP 9 9   < >  --  10 5 10    < > = values in this interval not displayed.    Lipids No results for input(s): CHOL, TRIG, HDL, LABVLDL, LDLCALC, CHOLHDL in the last 168 hours.  Hematology Recent Labs  Lab 01/10/23 0606 01/11/23 0409 01/13/23 0418  WBC 9.8 9.4 10.8*  RBC 2.62* 2.50* 2.45*  HGB 8.7* 8.5* 8.3*  HCT 27.2* 26.3* 25.7*  MCV 103.8* 105.2* 104.9*  MCH 33.2 34.0 33.9  MCHC 32.0 32.3 32.3  RDW 18.9* 18.9* 19.3*  PLT 378 363 412*   Thyroid No results for input(s): TSH, FREET4 in the last 168 hours.  BNP Recent Labs  Lab 01/08/23 0303  BNP 809.0*    DDimer No results for input(s): DDIMER in the last 168 hours.   Radiology    DG CHEST PORT 1 VIEW Result Date: 01/11/2023 CLINICAL DATA:  Fevers EXAM: PORTABLE CHEST 1 VIEW COMPARISON:  01/08/2023 FINDINGS: Cardiac shadow is within normal limits. Lungs are well aerated bilaterally. Elevation of the left hemidiaphragm is seen. Right PICC is seen with the catheter tip in the superior right atrium. This is stable from the  prior study. No other focal abnormality is seen. IMPRESSION: No change from the previous day. Electronically Signed   By: Oneil Devonshire M.D.   On: 01/11/2023 20:47      Assessment & Plan    1.New onset afib with RVR - new diagnosis this admission, presented with RVR in setting of EtOH intoxication - initially on dilt gtt, rates somewhat difficult to manage due to soft bp's - Concern for compliance and safety of oral anticoagulation in the setting of EtOH use on outpatient basis. Would be hesitant to consider TEE/DCCV without confidence in anticoag compliance. Severe LAE LAVI 55, chronic EtOH abuse likely would be difficult to maintain SR.  - has been able to start eliquis , per GI ok to continue as Hgb has been stable   - failed rate control attempts due to low bp's and ongoing tachycardia - initially started on oral amio, rates improved but still tachy at times. 01/10/22 started on IV amiodarone . Dig stopped due to higher levels  - rates primarily <100, continue amio additional 24 hours. Likely transition to oral tomorrow. Soft bp's continue to limit av nodal agent use, he is on midodrine .      2.Acute HFpEF - 12/2022 echo LVEF 55-60%, no WMAs, indet diastolic, RV not well visualized, severe LAE LAVI 55 - BNP up to 549, CXR with CHF. - had significant leg and scrotal edema - negative 810 mL yesterday, neg 10.2 L since admission. Renal function overall stable  - on oral lasix  40mg  daily, continue  3. Anemia with positive FOBT - per GI and primary team - -EGD consistent with gastritis no signs of bleeding  - patient refused inpatient colonoscopy prep   4. EtoH abuse  For questions or updates, please contact [facility] Please consult [email or web address] for contact info under        Signed, Alvan Carrier, MD  01/13/2023, 9:45 AM

## 2023-01-13 NOTE — TOC Progression Note (Signed)
 Transition of Care [facility]) - Progression Note    Patient Details  Name: Justin Fleming MRN: [id number] Date of Birth: 30-Sep-1960  [facility] CM/SW Contact  Sharlyne Stabs, RN Phone Number: 01/13/2023, 9:43 AM  Clinical Narrative:   Patient not medically ready for discharge. Plan to go to [facility] when stable. Possibly Saturday. Isaiah updated.   Expected Discharge Plan: Skilled Nursing Facility Barriers to Discharge: No SNF bed  Expected Discharge Plan and Services      Living arrangements for the past 2 months: Single Family Home                   Social Determinants of Health (SDOH) Interventions SDOH Screenings   Food Insecurity: No Food Insecurity (12/30/2022)  Housing: Low Risk  (12/30/2022)  Transportation Needs: No Transportation Needs (12/30/2022)  Utilities: Not At Risk (12/30/2022)  Alcohol Screen: Low Risk  (02/02/2018)  Depression (PHQ2-9): Low Risk  (02/02/2018)  Tobacco Use: Medium Risk (01/08/2023)    Readmission Risk Interventions    01/11/2023   12:44 PM  Readmission Risk Prevention Plan  Transportation Screening Complete  PCP or Specialist Appt within 5-7 Days Not Complete  Home Care Screening Complete  Medication Review (RN CM) Complete

## 2023-01-13 NOTE — Progress Notes (Signed)
 Daily Progress Note   Patient Name: Justin Fleming       Date: 01/13/2023 DOB: Jan 19, 1960  Age: 63 y.o. MRN#: [id number] Attending Physician: Evonnie Lenis, MD Primary Care Physician: Patient, No Pcp Per Admit Date: 12/29/2022 Length of Stay: 14 days  Reason for Consultation/Follow-up: Establishing goals of care  HPI/Patient Profile:  63 y.o. male  with past medical history of HTN, GERD, anxiety/depression, alcohol abuse with rehab in 2016, currently drinking a pint of liquor per day, admitted on 12/29/2022 with HFpEF, A-fib with RVR/acute myocardial injury due to demand ischemia and acute anemia with heme positive stool with currently stable hemoglobin.    Palliative medicine was consulted for GOC conversations  Subjective:   Subjective: Chart Reviewed. Updates received. Patient Assessed. Created space and opportunity for patient  and family to explore thoughts and feelings regarding current medical situation.  Today's Discussion: Today saw the patient at bedside.  He states that he feels okay, improved compared to admission.  He does still complain of some knee pain, especially on the left.  He denies dyspnea.  He states 2 days ago when they got him up to the bedside chair and take a few steps around the room he was dyspneic on exertion.  Yesterday he did not feel strong enough for wanting to get up out of bed.  He does seem to be agreeable to getting up out of bed today.  I shared that nursing staff and therapy would likely attempt to get him out of bed later.  We again celebrated his improved mental status and ability to understand what is going on.  We reviewed his health history, the seriousness of his chronic diseases, and previously discussed goals of care and care limitations.  He confirmed that he would not want resuscitation or intubation.  Otherwise he would like to continue current treatment.  He is agreeable to short-term nursing home/rehab to attempt to get stronger.  He does  appreciate that he is gotten significantly weaker.  He states his appetite is okay, no nausea or vomiting.  Finally, I again encouraged him to discuss his goals of care and wishes with his family so that should he become unresponsive or unable to communicate his needs, his family could communicate his wishes with a firm understanding on what he would want.  I shared that palliative medicine would likely back off a bit.  However, I reassured him that we are available for any needs in the future. I provided emotional and general support through therapeutic listening, empathy, sharing of stories, therapeutic touch, and other techniques. I answered all questions and addressed all concerns to the best of my ability.  Review of Systems  Constitutional:  Positive for fatigue.       Feels okay, better compared to admission  Respiratory:  Negative for chest tightness and shortness of breath.   Cardiovascular:  Negative for chest pain.  Gastrointestinal:  Negative for abdominal pain, nausea and vomiting.  Musculoskeletal:        Chronic knee pain persists  Neurological:  Positive for weakness.    Objective:   Vital Signs:  BP 101/75   Pulse 89   Temp 97.8 F (36.6 C) (Oral)   Resp (!) 3   Ht 5' 10 (1.778 m)   Wt 92.5 kg   SpO2 90%   BMI 29.26 kg/m   Physical Exam Vitals and nursing note reviewed.  Constitutional:      General: He is not in acute distress.  Appearance: He is ill-appearing.  HENT:     Head: Normocephalic and atraumatic.  Cardiovascular:     Rate and Rhythm: Normal rate.  Pulmonary:     Effort: Pulmonary effort is normal. No respiratory distress.     Breath sounds: No wheezing or rhonchi.  Abdominal:     General: Abdomen is flat. Bowel sounds are normal. There is no distension.     Palpations: Abdomen is soft.  Skin:    General: Skin is warm and dry.  Neurological:     General: No focal deficit present.     Mental Status: He is alert.  Psychiatric:         Mood and Affect: Mood normal.        Behavior: Behavior normal.     Palliative Assessment/Data: 40%    Existing Vynca/ACP Documentation: None  Assessment & Plan:   Impression: Present on Admission:  Alcohol intoxication (HCC)  Atrial fibrillation with rapid ventricular response (HCC)  Lactic acidosis  Anemia due to acute blood loss  Tobacco use disorder  Alcohol abuse  Essential hypertension, benign  Elevated troponin  Vitamin B12 deficiency  SUMMARY OF RECOMMENDATIONS   DNR-limited Continue full scope of care otherwise Continue to encourage family to discuss goals of care and wishes with family Awaiting further medical improvement and workup Will monitor for significant clinical change Palliative medicine will continue to follow Please notify us  of any significant clinical change or new palliative needs  Symptom Management:  Per primary team PMT is available to assist as needed  Code Status: DNR-limited  Prognosis: Unable to determine  Discharge Planning:  SNF/rehab  Discussed with: Patient, medical team, nursing team  Thank you for allowing us  to participate in the care of Justin Fleming PMT will continue to support holistically.  Time Total: 45 min  Detailed review of medical records (labs, imaging, vital signs), medically appropriate exam, discussed with treatment team, counseling and education to patient, family, & staff, documenting clinical information, medication management, coordination of care  Camellia Kays, NP Palliative Medicine Team  Team Phone # [phone] (Nights/Weekends)  09/03/2020, 8:17 AM

## 2023-01-14 DIAGNOSIS — M1000 Idiopathic gout, unspecified site: Secondary | ICD-10-CM

## 2023-01-14 DIAGNOSIS — I4891 Unspecified atrial fibrillation: Secondary | ICD-10-CM | POA: Diagnosis not present

## 2023-01-14 DIAGNOSIS — R7989 Other specified abnormal findings of blood chemistry: Secondary | ICD-10-CM | POA: Diagnosis not present

## 2023-01-14 DIAGNOSIS — F10920 Alcohol use, unspecified with intoxication, uncomplicated: Secondary | ICD-10-CM | POA: Diagnosis not present

## 2023-01-14 LAB — CBC
HCT: 26 % — ABNORMAL LOW (ref 39.0–52.0)
Hemoglobin: 8 g/dL — ABNORMAL LOW (ref 13.0–17.0)
MCH: 32.1 pg (ref 26.0–34.0)
MCHC: 30.8 g/dL (ref 30.0–36.0)
MCV: 104.4 fL — ABNORMAL HIGH (ref 80.0–100.0)
Platelets: 429 10*3/uL — ABNORMAL HIGH (ref 150–400)
RBC: 2.49 MIL/uL — ABNORMAL LOW (ref 4.22–5.81)
RDW: 19.2 % — ABNORMAL HIGH (ref 11.5–15.5)
WBC: 9.2 10*3/uL (ref 4.0–10.5)
nRBC: 0 % (ref 0.0–0.2)

## 2023-01-14 LAB — RESPIRATORY PANEL BY PCR

## 2023-01-14 LAB — BASIC METABOLIC PANEL
Anion gap: 9 (ref 5–15)
BUN: 19 mg/dL (ref 8–23)
CO2: 37 mmol/L — ABNORMAL HIGH (ref 22–32)
Calcium: 8.6 mg/dL — ABNORMAL LOW (ref 8.9–10.3)
Chloride: 87 mmol/L — ABNORMAL LOW (ref 98–111)
Creatinine, Ser: 1.07 mg/dL (ref 0.61–1.24)
GFR, Estimated: >60 mL/min (ref >60)
Glucose, Bld: 102 mg/dL — ABNORMAL HIGH (ref 70–99)
Potassium: 3.2 mmol/L — ABNORMAL LOW (ref 3.5–5.1)
Sodium: 133 mmol/L — ABNORMAL LOW (ref 135–145)

## 2023-01-14 LAB — MAGNESIUM: Magnesium: 1.8 mg/dL (ref 1.7–2.4)

## 2023-01-14 LAB — PROCALCITONIN: Procalcitonin: <0.1 ng/mL

## 2023-01-14 MED ORDER — AMIODARONE HCL 200 MG PO TABS
400.0000 mg | ORAL_TABLET | Freq: Two times a day (BID) | ORAL | Status: DC
  Administered 2023-01-14 – 2023-01-16 (×5): 400 mg via ORAL
  Filled 2023-01-14 (×5): qty 2

## 2023-01-14 MED ORDER — MAGNESIUM SULFATE 2 GM/50ML IV SOLN
2.0000 g | Freq: Once | INTRAVENOUS | Status: AC
  Administered 2023-01-14: 2 g via INTRAVENOUS
  Filled 2023-01-14: qty 50

## 2023-01-14 MED ORDER — PREDNISONE 20 MG PO TABS
50.0000 mg | ORAL_TABLET | Freq: Every day | ORAL | Status: DC
  Administered 2023-01-15 – 2023-01-16 (×2): 50 mg via ORAL
  Filled 2023-01-14: qty 5
  Filled 2023-01-14: qty 1

## 2023-01-14 MED ORDER — METHYLPREDNISOLONE SODIUM SUCC 125 MG IJ SOLR
60.0000 mg | Freq: Once | INTRAMUSCULAR | Status: AC
  Administered 2023-01-14: 60 mg via INTRAVENOUS
  Filled 2023-01-14: qty 2

## 2023-01-14 MED ORDER — POTASSIUM CHLORIDE CRYS ER 20 MEQ PO TBCR
40.0000 meq | EXTENDED_RELEASE_TABLET | Freq: Four times a day (QID) | ORAL | Status: AC
  Administered 2023-01-14 (×2): 40 meq via ORAL
  Filled 2023-01-14 (×2): qty 2

## 2023-01-14 NOTE — Plan of Care (Signed)

## 2023-01-14 NOTE — Progress Notes (Signed)
     Referral previously received for Justin Fleming for goals of care discussion. Noted most recent palliative in-person assessment dated 01/13/2023.  Chart reviewed for Recent provider notes, nurse notes, vitals, and labs and updates received from RN.   At this time patient appears stable. He is off IV amiodarone  converted to oral. Hgb slow downtrend, being monitored, may need to come off ac. Refused colonoscopy, EGD non-concerning. No plan for in person follow-up today, although he does remain high risk for decline.   Palliative medicine will be back on service Monday, will shadow his chart for decline. Please contact the palliative medicine provider on service for any significant clinical change or new/urgent needs that require our assistance with this patient.  Thank you for your referral and allowing PMT to assist in Justin Fleming's care.   Camellia Kays, NP Palliative Medicine Team Phone: [phone]  NO CHARGE

## 2023-01-14 NOTE — Progress Notes (Signed)
 Rounding Note    Patient Name: Justin Fleming Date of Encounter: 01/14/2023  [facility] Cardiologist: New  Subjective   No complaints  Inpatient Medications    Scheduled Meds:  apixaban   5 mg Oral BID   cyanocobalamin   1,000 mcg Intramuscular Once   vitamin B-12  1,000 mcg Oral Daily   docusate sodium   200 mg Oral Daily   ferrous sulfate   325 mg Oral BID WC   folic acid   1 mg Oral Daily   furosemide   40 mg Oral Daily   midodrine   15 mg Oral TID WC   multivitamin with minerals  1 tablet Oral Daily   pantoprazole  (PROTONIX ) IV  40 mg Intravenous Q12H   polyethylene glycol  17 g Oral Daily   sodium chloride  flush  10-40 mL Intracatheter Q12H   sodium chloride  flush  3 mL Intravenous Q12H   sucralfate   1 g Oral TID WC & HS   thiamine   100 mg Oral Daily   Continuous Infusions:  amiodarone  30 mg/hr (01/14/23 0148)   PRN Meds: acetaminophen , diphenhydrAMINE , HYDROmorphone  (DILAUDID ) injection, mouth rinse, oxyCODONE , sodium chloride  flush   Vital Signs    Vitals:   01/14/23 0400 01/14/23 0500 01/14/23 0600 01/14/23 0759  BP: 99/73 108/73 105/76   Pulse: 81 90 89   Resp: 13 14 17    Temp: 98.4 F (36.9 C)   98.3 F (36.8 C)  TempSrc:    Oral  SpO2: 100% 92% 100%   Weight:      Height:        Intake/Output Summary (Last 24 hours) at 01/14/2023 0837 Last data filed at 01/14/2023 9375 Gross per 24 hour  Intake 240 ml  Output 1250 ml  Net -1010 ml      01/13/2023    4:17 AM 01/12/2023    6:31 AM 01/11/2023    5:00 AM  Last 3 Weights  Weight (lbs) 203 lb 14.8 oz 199 lb 8.3 oz 201 lb 4.5 oz  Weight (kg) 92.5 kg 90.5 kg 91.3 kg      Telemetry    Afib 90s to 100s - Personally Reviewed  ECG    N/a - Personally Reviewed  Physical Exam   GEN: No acute distress.   Neck: No JVD Cardiac: irreg Respiratory: Clear to auscultation bilaterally. GI: Soft, nontender, non-distended  MS: No edema; No deformity. Neuro:  Nonfocal  Psych: Normal affect    Labs    High Sensitivity Troponin:   Recent Labs  Lab 12/30/22 0001 12/30/22 0239  TROPONINIHS 39* 34*     Chemistry Recent Labs  Lab 01/08/23 0303 01/09/23 9376 01/10/23 0755 01/11/23 0409 01/12/23 0449 01/13/23 0418 01/14/23 0440  NA 130*   < >  --    < > 131* 135 133*  K 3.5   < >  --    < > 3.6 3.5 3.2*  CL 83*   < >  --    < > 86* 87* 87*  CO2 38*   < >  --    < > 40* 38* 37*  GLUCOSE 107*   < >  --    < > 104* 104* 102*  BUN 11   < >  --    < > 19 20 19   CREATININE 0.89   < >  --    < > 1.13 1.23 1.07  CALCIUM 8.4*   < >  --    < > 8.7* 8.9 8.6*  MG  --   --  2.0  --   --   --  1.8  PROT 6.2*  --   --   --   --   --   --   ALBUMIN  2.6*  --   --   --   --   --   --   AST 14*  --   --   --   --   --   --   ALT 10  --   --   --   --   --   --   ALKPHOS 48  --   --   --   --   --   --   BILITOT 1.2  --   --   --   --   --   --   GFRNONAA >60   < >  --    < > >60 >60 >60  ANIONGAP 9   < >  --    < > 5 10 9    < > = values in this interval not displayed.    Lipids No results for input(s): CHOL, TRIG, HDL, LABVLDL, LDLCALC, CHOLHDL in the last 168 hours.  Hematology Recent Labs  Lab 01/11/23 0409 01/13/23 0418 01/14/23 0440  WBC 9.4 10.8* 9.2  RBC 2.50* 2.45* 2.49*  HGB 8.5* 8.3* 8.0*  HCT 26.3* 25.7* 26.0*  MCV 105.2* 104.9* 104.4*  MCH 34.0 33.9 32.1  MCHC 32.3 32.3 30.8  RDW 18.9* 19.3* 19.2*  PLT 363 412* 429*   Thyroid No results for input(s): TSH, FREET4 in the last 168 hours.  BNP Recent Labs  Lab 01/08/23 0303  BNP 809.0*    DDimer No results for input(s): DDIMER in the last 168 hours.   Radiology    DG Knee 1-2 Views Right Result Date: 01/13/2023 CLINICAL DATA:  Right knee pain and swelling. Limited range of motion. No known injury. EXAM: RIGHT KNEE - 1-2 VIEW COMPARISON:  None Available. FINDINGS: Mildly decreased bone mineralization. Borderline mild patella alta with Insall-Salvati ratio measuring 1.3. Mild  patellofemoral joint space narrowing. Moderate joint effusion. Minimal chronic enthesopathic change at the quadriceps insertion on the patella. The medial and lateral compartment joint spaces are maintained. A 3 mm apparent metallic density overlies the bone of the proximal to mid tibial diaphysis on lateral view, not included on frontal view. No acute fracture dislocation. IMPRESSION: 1. Mild patellofemoral osteoarthritis. 2. Moderate joint effusion. 3. Borderline mild patella alta. 4. A 3 mm apparent metallic density overlies the bone of the proximal to mid tibial diaphysis on lateral view, not included on frontal view. Recommend clinical correlation for prior trauma or procedure in this region of the bone/calf. Electronically Signed   By: Tanda Lyons M.D.   On: 01/13/2023 17:56    Cardiac Studies     Patient Profile       Assessment & Plan    1.New onset afib with RVR - new diagnosis this admission, presented with RVR in setting of EtOH intoxication - initially on dilt gtt, rates somewhat difficult to manage due to soft bp's - Concern for compliance and safety of oral anticoagulation in the setting of EtOH use on outpatient basis. Would be hesitant to consider TEE/DCCV without confidence in anticoag compliance. Severe LAE LAVI 55, chronic EtOH abuse likely would be difficult to maintain SR.  - has been able to start eliquis , per GI ok to continue as Hgb has been stable   -  failed rate control attempts due to low bp's and ongoing tachycardia - initially started on oral amio, rates improved but still tachy at times. 01/10/22 started on IV amiodarone . Dig stopped due to higher levels   - remains in afib 90s to 100s. D/c IV amio, start oral amio 400mg  bid x 1 week, then 200mg  bid x 2 weeks, then 200mg  daily.  - slow downtrend in Hgb, continue to monitor on anticoag     2.Acute HFpEF - 12/2022 echo LVEF 55-60%, no WMAs, indet diastolic, RV not well visualized, severe LAE LAVI 55 - BNP up to  549, CXR with CHF. - had significant leg and scrotal edema - negative 810 mL yesterday, neg 10.2 L since admission. Renal function overall stable  - on oral lasix  40mg  daily, continue     3. Anemia with positive FOBT - per GI and primary team - -EGD consistent with gastritis no signs of bleeding  - patient refused inpatient colonoscopy prep  - slow downtrend in Hgb, monitor while on eliquis . May have to d/c   4. EtoH abuse    We will follow telemetry and chart peripherally tomorrow.  For questions or updates, please contact [facility] Please consult [email or web address] for contact info under        Signed, Alvan Carrier, MD  01/14/2023, 8:37 AM

## 2023-01-14 NOTE — Progress Notes (Signed)
 PROGRESS NOTE  Justin Fleming FMW:[id number] DOB: 12-13-1960 DOA: 12/29/2022 PCP: Patient, No Pcp Per  Brief History:  History limited due to patient's intoxication/AMS, obtained by patient, chart review, EDP Justin Fleming is a 63 y.o. male with hx of alcohol abuse, hypertension, mood disorder, smoking, GERD, who was brought in after bystander contacted EMS because he was sitting in his car in the parking lot.   Found have alcohol intoxication, A-fib with RVR, suspected aspiration pneumonia, and worsening lactic acidosis with fluids.  Clinically suspect decompensated heart failure.  Denies any known history of A-fib.   EKG:  A-fib with RVR rate 159, LAD, LAFB, inferior Q's, poor R wave progression, T wave inversion laterally, no overt ischemic changes.    ED Course:  Found to be in A-fib with RVR, initially treated with metoprolol  2.5 mg IV x 1 with slight worsening of pressures to systolic in the 90s.  Started on heparin  for anticoagulation.  Treated with ceftriaxone /azithromycin , 2 L IV fluid, potassium 40 mg IV and mag 4 g.  At my request consulted with cardiology fellow re: rate control options.  Cardiology ultimately placed the patient on IV amiodarone .  Because of his soft blood pressures, his heart rates have been difficult to control.  The patient received intermittent boluses of amiodarone .  GI was consulted also secondary to heme positive stool and dropping hemoglobin.  EGD was performed and showed gastritis.  The patient refused colonoscopy during hospitalization.        Assessment/Plan: Acute HFpEF -12/29/2022 echo EF 55-60%, no WMA, trivial MR -Continue furosemide  -Appreciate cardiology --10.2 L since admission -Serum creatinine stable   New onset atrial fibrillation with RVR, type unspecified -Initially on diltiazem  drip -Difficult to manage rate secondary to soft BPs -Apixaban  has been started as hemoglobin remained stable levels--started 01/09/2023   -Continue midodrine  -Transition to oral amiodarone  per cardiology --Not a candidate for cardioversion as to be difficult to commit to long-term anticoagulation post cardioversion in the setting of GI bleed without endoluminal evaluation    Fever/Gouty Arthritis flare -UA negative for pyuria -Personally reviewed chest x-ray--no consolidations -Blood culture x 2 sets--neg -COVID-19 PCR negative -check RVP -PCT<0.10 -uric acid--9.5 -xray right knee--mod joint effusion, +OA; 3 mm apparent metallic density overlies the bone of the proximal to mid tibial diaphysis -start prednisone    Acute respiratory failure with hypoxia -Secondary to CHF -Stable on 2 L   Alcohol intoxication -No sign of withdrawal presently -Initially on CIWA -Continue thiamine  and multivitamin -Reports drinking up to 1.75 L of liquor a day, no known history of severe withdrawal in the past /seizure/DT.   Acute anemia with FOBT positive -01/08/2023 EGD--gastritis, normal duodenum -Patient refused colonoscopy -Hemoglobin--slow trend down -Monitor hemoglobin on apixaban  -No overt signs of bleeding at this time   Capacity evaluation----per Justin Fleming: Earlier on tn this admission Patient was found incompetent due to intermittent confusion and forgetfulness He Discussed it with sisters ---Justin Fleming and Justin Fleming as well as Justin Fleming and Justin Fleming--  at bedside--- they have offered to make medical decisions for patient   - TSH 2.7  - Folate 3.6, B12 -158, - HIV, RPR>> nonreactive -On 01/12/2023, the patient was deemed to have capacity to make decisions   Low B12 /folic acid  -Repleting               Family Communication:   no Family at bedside   Consultants:  cardiology   Code  Status:  DNR   DVT Prophylaxis: apixaban      Procedures: As Listed in Progress Note Above   Antibiotics: None           Subjective: Complains of right knee pain.  Denies f/,c cp, sob, n/v/d, abd  pain  Objective: Vitals:   01/14/23 1500 01/14/23 1530 01/14/23 1545 01/14/23 1600  BP: (!) 116/55   113/83  Pulse: 85 68 91 99  Resp: 20 16 19 19   Temp:    97.6 F (36.4 C)  TempSrc:    Oral  SpO2: 96% 98% 100% 100%  Weight:      Height:        Intake/Output Summary (Last 24 hours) at 01/14/2023 1715 Last data filed at 01/14/2023 1418 Gross per 24 hour  Intake --  Output 1500 ml  Net -1500 ml   Weight change:  Exam:  General:  Pt is alert, follows commands appropriately, not in acute distress HEENT: No icterus, No thrush, No neck mass, [place]/AT Cardiovascular: RRR, S1/S2, no rubs, no gallops Respiratory: CTA bilaterally, no wheezing, no crackles, no rhonchi Abdomen: Soft/+BS, non tender, non distended, no guarding Extremities: No edema, No lymphangitis, No petechiae, No rashes, +right knee effusion, synovitis   Data Reviewed: I have personally reviewed following labs and imaging studies Basic Metabolic Panel: Recent Labs  Lab 01/10/23 0606 01/10/23 0755 01/11/23 0409 01/12/23 0449 01/13/23 0418 01/14/23 0440  NA 135  --  136 131* 135 133*  K 3.4*  --  3.8 3.6 3.5 3.2*  CL 86*  --  88* 86* 87* 87*  CO2 39*  --  38* 40* 38* 37*  GLUCOSE 112*  --  105* 104* 104* 102*  BUN 16  --  16 19 20 19   CREATININE 1.17  --  1.10 1.13 1.23 1.07  CALCIUM 9.2  --  9.0 8.7* 8.9 8.6*  MG  --  2.0  --   --   --  1.8   Liver Function Tests: Recent Labs  Lab 01/08/23 0303  AST 14*  ALT 10  ALKPHOS 48  BILITOT 1.2  PROT 6.2*  ALBUMIN  2.6*   No results for input(s): LIPASE, AMYLASE in the last 168 hours. No results for input(s): AMMONIA in the last 168 hours. Coagulation Profile: No results for input(s): INR, PROTIME in the last 168 hours. CBC: Recent Labs  Lab 01/09/23 0917 01/10/23 0606 01/11/23 0409 01/13/23 0418 01/14/23 0440  WBC 9.1 9.8 9.4 10.8* 9.2  HGB 8.9* 8.7* 8.5* 8.3* 8.0*  HCT 26.7* 27.2* 26.3* 25.7* 26.0*  MCV 103.9* 103.8* 105.2* 104.9*  104.4*  PLT 356 378 363 412* 429*   Cardiac Enzymes: No results for input(s): CKTOTAL, CKMB, CKMBINDEX, TROPONINI in the last 168 hours. BNP: Invalid input(s): POCBNP CBG: No results for input(s): GLUCAP in the last 168 hours. HbA1C: No results for input(s): HGBA1C in the last 72 hours. Urine analysis:    Component Value Date/Time   COLORURINE YELLOW 01/12/2023 0053   APPEARANCEUR CLEAR 01/12/2023 0053   LABSPEC 1.019 01/12/2023 0053   PHURINE 5.0 01/12/2023 0053   GLUCOSEU NEGATIVE 01/12/2023 0053   HGBUR NEGATIVE 01/12/2023 0053   BILIRUBINUR NEGATIVE 01/12/2023 0053   KETONESUR NEGATIVE 01/12/2023 0053   PROTEINUR 30 (A) 01/12/2023 0053   NITRITE NEGATIVE 01/12/2023 0053   LEUKOCYTESUR NEGATIVE 01/12/2023 0053   Sepsis Labs: @LABRCNTIP (procalcitonin:4,lacticidven:4) ) Recent Results (from the past 240 hours)  Resp panel by RT-PCR (RSV, Flu A&B, Covid) Anterior Nasal Swab  Status: None   Collection Time: 01/11/23  8:50 PM   Specimen: Anterior Nasal Swab  Result Value Ref Range Status   SARS Coronavirus 2 by RT PCR NEGATIVE NEGATIVE Final    Comment: (NOTE) SARS-CoV-2 target nucleic acids are NOT DETECTED.  The SARS-CoV-2 RNA is generally detectable in upper respiratory specimens during the acute phase of infection. The lowest concentration of SARS-CoV-2 viral copies this assay can detect is 138 copies/mL. A negative result does not preclude SARS-Cov-2 infection and should not be used as the sole basis for treatment or other patient management decisions. A negative result may occur with  improper specimen collection/handling, submission of specimen other than nasopharyngeal swab, presence of viral mutation(s) within the areas targeted by this assay, and inadequate number of viral copies(<138 copies/mL). A negative result must be combined with clinical observations, patient history, and epidemiological information. The expected result is  Negative.  Fact Sheet for Patients:  bloggercourse.com  Fact Sheet for Healthcare Providers:  seriousbroker.it  This test is no t yet approved or cleared by the [place]  FDA and  has been authorized for detection and/or diagnosis of SARS-CoV-2 by FDA under an Emergency Use Authorization (EUA). This EUA will remain  in effect (meaning this test can be used) for the duration of the COVID-19 declaration under Section 564(b)(1) of the Act, 21 U.S.C.section 360bbb-3(b)(1), unless the authorization is terminated  or revoked sooner.       Influenza A by PCR NEGATIVE NEGATIVE Final   Influenza B by PCR NEGATIVE NEGATIVE Final    Comment: (NOTE) The Xpert Xpress SARS-CoV-2/FLU/RSV plus assay is intended as an aid in the diagnosis of influenza from Nasopharyngeal swab specimens and should not be used as a sole basis for treatment. Nasal washings and aspirates are unacceptable for Xpert Xpress SARS-CoV-2/FLU/RSV testing.  Fact Sheet for Patients: bloggercourse.com  Fact Sheet for Healthcare Providers: seriousbroker.it  This test is not yet approved or cleared by the [place]  FDA and has been authorized for detection and/or diagnosis of SARS-CoV-2 by FDA under an Emergency Use Authorization (EUA). This EUA will remain in effect (meaning this test can be used) for the duration of the COVID-19 declaration under Section 564(b)(1) of the Act, 21 U.S.C. section 360bbb-3(b)(1), unless the authorization is terminated or revoked.     Resp Syncytial Virus by PCR NEGATIVE NEGATIVE Final    Comment: (NOTE) Fact Sheet for Patients: bloggercourse.com  Fact Sheet for Healthcare Providers: seriousbroker.it  This test is not yet approved or cleared by the [place]  FDA and has been authorized for detection and/or diagnosis of  SARS-CoV-2 by FDA under an Emergency Use Authorization (EUA). This EUA will remain in effect (meaning this test can be used) for the duration of the COVID-19 declaration under Section 564(b)(1) of the Act, 21 U.S.C. section 360bbb-3(b)(1), unless the authorization is terminated or revoked.  Performed at [facility], [address] [place], [place] [address]   Culture, blood (Routine X 2) w Reflex to ID Panel     Status: None (Preliminary result)   Collection Time: 01/13/23  4:03 PM   Specimen: BLOOD LEFT HAND  Result Value Ref Range Status   Specimen Description BLOOD LEFT HAND  Final   Special Requests   Final    BOTTLES DRAWN AEROBIC AND ANAEROBIC Blood Culture adequate volume   Culture   Final    NO GROWTH < 24 HOURS Performed at [facility], [address], [place], [place] [address]    Report Status  PENDING  Incomplete  Culture, blood (Routine X 2) w Reflex to ID Panel     Status: None (Preliminary result)   Collection Time: 01/13/23  4:03 PM   Specimen: BLOOD  Result Value Ref Range Status   Specimen Description BLOOD BLOOD LEFT HAND  Final   Special Requests NONE  Final   Culture   Final    NO GROWTH < 24 HOURS Performed at [facility], [address], [place], [place] [address]    Report Status PENDING  Incomplete     Scheduled Meds:  amiodarone   400 mg Oral BID   apixaban   5 mg Oral BID   cyanocobalamin   1,000 mcg Intramuscular Once   vitamin B-12  1,000 mcg Oral Daily   docusate sodium   200 mg Oral Daily   ferrous sulfate   325 mg Oral BID WC   folic acid   1 mg Oral Daily   furosemide   40 mg Oral Daily   midodrine   15 mg Oral TID WC   multivitamin with minerals  1 tablet Oral Daily   pantoprazole  (PROTONIX ) IV  40 mg Intravenous Q12H   polyethylene glycol  17 g Oral Daily   sodium chloride  flush  10-40 mL Intracatheter Q12H   sodium chloride  flush  3 mL Intravenous Q12H   sucralfate   1 g Oral TID WC & HS   thiamine   100 mg Oral Daily   Continuous  Infusions:  Procedures/Studies: DG Knee 1-2 Views Right Result Date: 01/13/2023 CLINICAL DATA:  Right knee pain and swelling. Limited range of motion. No known injury. EXAM: RIGHT KNEE - 1-2 VIEW COMPARISON:  None Available. FINDINGS: Mildly decreased bone mineralization. Borderline mild patella alta with Insall-Salvati ratio measuring 1.3. Mild patellofemoral joint space narrowing. Moderate joint effusion. Minimal chronic enthesopathic change at the quadriceps insertion on the patella. The medial and lateral compartment joint spaces are maintained. A 3 mm apparent metallic density overlies the bone of the proximal to mid tibial diaphysis on lateral view, not included on frontal view. No acute fracture dislocation. IMPRESSION: 1. Mild patellofemoral osteoarthritis. 2. Moderate joint effusion. 3. Borderline mild patella alta. 4. A 3 mm apparent metallic density overlies the bone of the proximal to mid tibial diaphysis on lateral view, not included on frontal view. Recommend clinical correlation for prior trauma or procedure in this region of the bone/calf. Electronically Signed   By: Tanda Lyons M.D.   On: 01/13/2023 17:56   DG CHEST PORT 1 VIEW Result Date: 01/11/2023 CLINICAL DATA:  Fevers EXAM: PORTABLE CHEST 1 VIEW COMPARISON:  01/08/2023 FINDINGS: Cardiac shadow is within normal limits. Lungs are well aerated bilaterally. Elevation of the left hemidiaphragm is seen. Right PICC is seen with the catheter tip in the superior right atrium. This is stable from the prior study. No other focal abnormality is seen. IMPRESSION: No change from the previous day. Electronically Signed   By: Oneil Devonshire M.D.   On: 01/11/2023 20:47   DG CHEST PORT 1 VIEW Result Date: 01/08/2023 CLINICAL DATA:  Dyspnea, respiratory abnormalities. EXAM: PORTABLE CHEST 1 VIEW COMPARISON:  X-ray 01/05/2023 and older. FINDINGS: Underinflation. Slight elevation of the left hemidiaphragm. Stable enlarged cardiopericardial silhouette with a  tortuous and ectatic aorta. Tiny pleural effusions. Bandlike changes at the bases. No pneumothorax. Overlapping cardiac leads. Right-sided PICC with the tip along the SVC right atrial junction. Overall opacities are slightly improving from previous. IMPRESSION: Slight interval reduction of the lung base opacities and effusions with some residual. Recommend continued follow  up. Decreasing vascular congestion. Electronically Signed   By: Ranell Bring M.D.   On: 01/08/2023 11:27   DG CHEST PORT 1 VIEW Result Date: 01/05/2023 CLINICAL DATA:  Dyspnea. EXAM: PORTABLE CHEST 1 VIEW COMPARISON:  January 03, 2023. FINDINGS: Stable cardiomegaly. Mild central pulmonary vascular congestion is noted. Hypoinflation of the lungs is noted with bibasilar atelectasis or edema with associated effusions. Bony thorax is unremarkable. Right-sided PICC line is unchanged. IMPRESSION: Stable cardiomegaly with mild central pulmonary vascular congestion. Hypoinflation of the lungs is noted with bibasilar atelectasis or edema with associated effusions. Electronically Signed   By: Lynwood Landy Raddle M.D.   On: 01/05/2023 10:50   US  Abdomen Limited RUQ (LIVER/GB) Result Date: 01/04/2023 CLINICAL DATA:  Liver disease. EXAM: ULTRASOUND ABDOMEN LIMITED RIGHT UPPER QUADRANT COMPARISON:  Ultrasound 2016 FINDINGS: Gallbladder: Gallbladder is underdistended. Sludge and stones suggested. The wall thickening of the gallbladder could relate to the patient's history of chronic liver disease in the contracted state. Evaluation for Murphy's sign is limited as the patient could not responding to commands. Common bile duct: Diameter: 4 mm Liver: Echogenic hepatic parenchyma consistent with fatty liver infiltration. Please correlate for known history of chronic liver disease. If there is concern further of underlying mass lesion additional workup with a contrast study is recommended. Portal vein is patent on color Doppler imaging with normal direction of  blood flow towards the liver. Other: Mild ascites. IMPRESSION: Echogenic liver.  Mild ascites. Contracted gallbladder with stones and sludge. No ductal dilatation. Electronically Signed   By: Ranell Bring M.D.   On: 01/04/2023 15:38   DG CHEST PORT 1 VIEW Result Date: 01/03/2023 CLINICAL DATA:  [id number] Dyspnea and respiratory abnormalities [id number] EXAM: PORTABLE CHEST 1 VIEW COMPARISON:  12/30/2022. FINDINGS: Low lung volume. Mild-to-moderate diffuse pulmonary vascular congestion without significant interval change. There are bilateral small layering pleural effusions with probable associated compressive atelectatic changes. No pneumothorax. Stable cardio-mediastinal silhouette. No acute osseous abnormalities. The soft tissues are within normal limits. Right-sided PICC line is seen with its tip overlying the cavoatrial junction region. IMPRESSION: *Findings favor congestive heart failure/pulmonary edema, without significant interval change. Electronically Signed   By: Ree Molt M.D.   On: 01/03/2023 08:54   CT Angio Chest Pulmonary Embolism (PE) W or WO Contrast Result Date: 12/31/2022 CLINICAL DATA:  Elevated D-dimer and peripheral swelling EXAM: CT ANGIOGRAPHY CHEST WITH CONTRAST TECHNIQUE: Multidetector CT imaging of the chest was performed using the standard protocol during bolus administration of intravenous contrast. Multiplanar CT image reconstructions and MIPs were obtained to evaluate the vascular anatomy. RADIATION DOSE REDUCTION: This exam was performed according to the departmental dose-optimization program which includes automated exposure control, adjustment of the mA and/or kV according to patient size and/or use of iterative reconstruction technique. CONTRAST:  OMNIPAQUE  IOHEXOL  350 MG/ML SOLN COMPARISON:  Chest x-ray from the previous day. FINDINGS: Cardiovascular: Thoracic aorta shows a normal branching pattern. No aneurysmal dilatation or dissection is noted. Heart is at the upper  limits of normal in size. The pulmonary artery shows a normal branching pattern bilaterally. No filling defect to suggest pulmonary embolism is noted. Coronary calcifications are seen. Right PICC is noted. Mediastinum/Nodes: Thoracic inlet is within normal limits. No hilar or mediastinal adenopathy is noted. The esophagus as visualized is within normal limits. Lungs/Pleura: Small left-sided pleural effusion is noted. Tiny right-sided pleural effusion is noted with the predominant lateral component. Bilateral lower lobe atelectasis is seen. No focal confluent infiltrate is noted. No parenchymal nodules are  noted. Upper Abdomen: No acute abnormality. Musculoskeletal: Degenerative changes of the thoracic spine are noted. Review of the MIP images confirms the above findings. IMPRESSION: Small pleural effusions, left greater than right with lower lobe atelectasis. No evidence of pulmonary emboli. Electronically Signed   By: Oneil Devonshire M.D.   On: 12/31/2022 18:53   US  Venous Img Lower Bilateral (DVT) Result Date: 12/31/2022 CLINICAL DATA:  Lower extremity swelling EXAM: BILATERAL LOWER EXTREMITY VENOUS DOPPLER ULTRASOUND TECHNIQUE: Gray-scale sonography with graded compression, as well as color Doppler and duplex ultrasound were performed to evaluate the lower extremity deep venous systems from the level of the common femoral vein and including the common femoral, femoral, profunda femoral, popliteal and calf veins including the posterior tibial, peroneal and gastrocnemius veins when visible. The superficial great saphenous vein was also interrogated. Spectral Doppler was utilized to evaluate flow at rest and with distal augmentation maneuvers in the common femoral, femoral and popliteal veins. COMPARISON:  None Available. FINDINGS: RIGHT LOWER EXTREMITY Common Femoral Vein: No evidence of thrombus. Normal compressibility, respiratory phasicity and response to augmentation. Saphenofemoral Junction: No evidence of  thrombus. Normal compressibility and flow on color Doppler imaging. Profunda Femoral Vein: No evidence of thrombus. Normal compressibility and flow on color Doppler imaging. Femoral Vein: No evidence of thrombus. Normal compressibility, respiratory phasicity and response to augmentation. Popliteal Vein: No evidence of thrombus. Normal compressibility, respiratory phasicity and response to augmentation. Calf Veins: No evidence of thrombus. Normal compressibility and flow on color Doppler imaging. Superficial Great Saphenous Vein: No evidence of thrombus. Normal compressibility and flow on color Doppler imaging. Venous Reflux:  None. Other Findings:  Lower extremity edema is noted. LEFT LOWER EXTREMITY Common Femoral Vein: No evidence of thrombus. Normal compressibility, respiratory phasicity and response to augmentation. Saphenofemoral Junction: No evidence of thrombus. Normal compressibility and flow on color Doppler imaging. Profunda Femoral Vein: No evidence of thrombus. Normal compressibility and flow on color Doppler imaging. Femoral Vein: No evidence of thrombus. Normal compressibility, respiratory phasicity and response to augmentation. Popliteal Vein: No evidence of thrombus. Normal compressibility, respiratory phasicity and response to augmentation. Calf Veins: No evidence of thrombus. Normal compressibility and flow on color Doppler imaging. Superficial Great Saphenous Vein: No evidence of thrombus. Normal compressibility and flow on color Doppler imaging. Venous Reflux:  None. Other Findings:  Lower extremity edema is noted. IMPRESSION: No evidence of deep venous thrombosis. Electronically Signed   By: Oneil Devonshire M.D.   On: 12/31/2022 18:49   ECHOCARDIOGRAM COMPLETE Result Date: 12/31/2022    ECHOCARDIOGRAM REPORT   Patient Name:   Justin Fleming Date of Exam: 12/30/2022 Medical Rec #:  [id number]        Height:       70.0 in Accession #:    [id number]       Weight:       218.0 lb Date of Birth:   [birth year]        BSA:          2.165 m Patient Age:    62 years         BP:           119/81 mmHg Patient Gender: M                HR:           94 bpm. Exam Location:  [facility] Procedure: 2D Echo, Cardiac Doppler, Color Doppler and Intracardiac  Opacification Agent Indications:    Congestive Heart Failure I50.9  History:        Patient has no prior history of Echocardiogram examinations.                 Risk Factors:Hypertension and Current Smoker.  Sonographer:    Aida Pizza RCS Referring Phys: [id number] [facility]  Sonographer Comments: Technically difficult study due to poor echo windows. IMPRESSIONS  1. Left ventricular ejection fraction, by estimation, is 55 to 60%. The left ventricle has normal function. The left ventricle has no regional wall motion abnormalities. There is mild concentric left ventricular hypertrophy. Left ventricular diastolic parameters are indeterminate.  2. Right ventricular systolic function was not well visualized. The right ventricular size is not well visualized. Tricuspid regurgitation signal is inadequate for assessing PA pressure.  3. Left atrial size was severely dilated.  4. Right atrial size was moderately dilated.  5. The mitral valve is grossly normal. Trivial mitral valve regurgitation.  6. The aortic valve was not well visualized. Aortic valve regurgitation is not visualized.  7. The inferior vena cava is dilated in size with <50% respiratory variability, suggesting right atrial pressure of 15 mmHg. Comparison(s): No prior Echocardiogram. FINDINGS  Left Ventricle: Left ventricular ejection fraction, by estimation, is 55 to 60%. The left ventricle has normal function. The left ventricle has no regional wall motion abnormalities. Definity  contrast agent was given IV to delineate the left ventricular  endocardial borders. The left ventricular internal cavity size was normal in size. There is mild concentric left ventricular hypertrophy. Left ventricular  diastolic function could not be evaluated due to atrial fibrillation. Left ventricular diastolic parameters are indeterminate. Right Ventricle: The right ventricular size is not well visualized. Right vetricular wall thickness was not well visualized. Right ventricular systolic function was not well visualized. Tricuspid regurgitation signal is inadequate for assessing PA pressure. Left Atrium: Left atrial size was severely dilated. Right Atrium: Right atrial size was moderately dilated. Pericardium: There is no evidence of pericardial effusion. Mitral Valve: The mitral valve is grossly normal. Trivial mitral valve regurgitation. Tricuspid Valve: The tricuspid valve is grossly normal. Tricuspid valve regurgitation is trivial. Aortic Valve: The aortic valve was not well visualized. Aortic valve regurgitation is not visualized. Pulmonic Valve: The pulmonic valve was not well visualized. Pulmonic valve regurgitation is not visualized. Aorta: The aortic root is normal in size and structure. Venous: The inferior vena cava is dilated in size with less than 50% respiratory variability, suggesting right atrial pressure of 15 mmHg. IAS/Shunts: No atrial level shunt detected by color flow Doppler.  LEFT VENTRICLE PLAX 2D LVIDd:         3.40 cm LVIDs:         2.40 cm LV PW:         1.10 cm LV IVS:        1.10 cm LVOT diam:     1.80 cm LV SV:         44 LV SV Index:   20 LVOT Area:     2.54 cm  RIGHT VENTRICLE TAPSE (M-mode): 1.2 cm LEFT ATRIUM              Index        RIGHT ATRIUM           Index LA diam:        2.80 cm  1.29 cm/m   RA Area:     26.20 cm LA Vol (A2C):   115.0 ml  53.11 ml/m  RA Volume:   90.00 ml  41.56 ml/m LA Vol (A4C):   112.0 ml 51.72 ml/m LA Biplane Vol: 119.0 ml 54.96 ml/m  AORTIC VALVE LVOT Vmax:   97.50 cm/s LVOT Vmean:  72.800 cm/s LVOT VTI:    0.173 m  AORTA Ao Root diam: 3.10 cm MITRAL VALVE MV Area (PHT): 4.21 cm     SHUNTS MV Decel Time: 180 msec     Systemic VTI:  0.17 m MV E velocity:  127.00 cm/s  Systemic Diam: 1.80 cm Jayson Sierras MD Electronically signed by Jayson Sierras MD Signature Date/Time: 12/31/2022/1:36:31 PM    Final    DG CHEST PORT 1 VIEW Result Date: 12/30/2022 CLINICAL DATA:  PICC line placement. EXAM: PORTABLE CHEST 1 VIEW COMPARISON:  One-view chest x-ray 12/29/2022 FINDINGS: Heart is enlarged. Right pleural effusion remains. Mild pulmonary vascular congestion is present bilaterally. A new right-sided PICC line is in place. The tip is at the cavoatrial junction. No pneumothorax is present. The visualized soft tissues and bony thorax are otherwise unremarkable. IMPRESSION: 1. New right-sided PICC line with tip at the cavoatrial junction. 2. Cardiomegaly and mild pulmonary vascular congestion. 3. Persistent right pleural effusion. Electronically Signed   By: Lonni Necessary M.D.   On: 12/30/2022 16:25   US  EKG SITE RITE Result Date: 12/30/2022 If Site Rite image not attached, placement could not be confirmed due to current cardiac rhythm.  DG Chest Port 1 View Result Date: 12/29/2022 CLINICAL DATA:  Altered mental status. EXAM: PORTABLE CHEST 1 VIEW COMPARISON:  11/05/2014 FINDINGS: Extremely shallow inspiration. Heart size and pulmonary vascularity are probably normal for technique. Suggestion of a right pleural effusion with basilar atelectasis or infiltration. No visible pneumothorax. IMPRESSION: Shallow inspiration. Right pleural effusion with basilar atelectasis or infiltration. Electronically Signed   By: Elsie Gravely M.D.   On: 12/29/2022 23:33   CT Head Wo Contrast Result Date: 12/29/2022 CLINICAL DATA:  Mental status change, unknown cause EXAM: CT HEAD WITHOUT CONTRAST TECHNIQUE: Contiguous axial images were obtained from the base of the skull through the vertex without intravenous contrast. RADIATION DOSE REDUCTION: This exam was performed according to the departmental dose-optimization program which includes automated exposure control,  adjustment of the mA and/or kV according to patient size and/or use of iterative reconstruction technique. COMPARISON:  None Available. FINDINGS: Brain: Mild diffuse cerebral atrophy. No acute intracranial abnormality. Specifically, no hemorrhage, hydrocephalus, mass lesion, acute infarction, or significant intracranial injury. Vascular: No hyperdense vessel or unexpected calcification. Skull: No acute calvarial abnormality. Sinuses/Orbits: No acute findings Other: None IMPRESSION: No acute intracranial abnormality. Mild atrophy. Electronically Signed   By: Franky Crease M.D.   On: 12/29/2022 23:28    Alm Schneider, DO  [facility]  If 7PM-7AM, please contact night-coverage [email or web address] Password [email or web address] 01/14/2023, 5:15 PM   LOS: 15 days

## 2023-01-15 ENCOUNTER — Encounter (HOSPITAL_COMMUNITY): Payer: Self-pay | Admitting: Internal Medicine

## 2023-01-15 DIAGNOSIS — E876 Hypokalemia: Secondary | ICD-10-CM | POA: Diagnosis not present

## 2023-01-15 DIAGNOSIS — D62 Acute posthemorrhagic anemia: Secondary | ICD-10-CM | POA: Diagnosis not present

## 2023-01-15 DIAGNOSIS — I5031 Acute diastolic (congestive) heart failure: Secondary | ICD-10-CM | POA: Diagnosis not present

## 2023-01-15 DIAGNOSIS — I4891 Unspecified atrial fibrillation: Secondary | ICD-10-CM | POA: Diagnosis not present

## 2023-01-15 LAB — BASIC METABOLIC PANEL
Anion gap: 8 (ref 5–15)
BUN: 22 mg/dL (ref 8–23)
CO2: 34 mmol/L — ABNORMAL HIGH (ref 22–32)
Calcium: 8.7 mg/dL — ABNORMAL LOW (ref 8.9–10.3)
Chloride: 88 mmol/L — ABNORMAL LOW (ref 98–111)
Creatinine, Ser: 1.18 mg/dL (ref 0.61–1.24)
GFR, Estimated: >60 mL/min (ref >60)
Glucose, Bld: 184 mg/dL — ABNORMAL HIGH (ref 70–99)
Potassium: 3.7 mmol/L (ref 3.5–5.1)
Sodium: 130 mmol/L — ABNORMAL LOW (ref 135–145)

## 2023-01-15 LAB — MAGNESIUM: Magnesium: 2 mg/dL (ref 1.7–2.4)

## 2023-01-15 LAB — CBC
HCT: 26.1 % — ABNORMAL LOW (ref 39.0–52.0)
Hemoglobin: 8.4 g/dL — ABNORMAL LOW (ref 13.0–17.0)
MCH: 33.2 pg (ref 26.0–34.0)
MCHC: 32.2 g/dL (ref 30.0–36.0)
MCV: 103.2 fL — ABNORMAL HIGH (ref 80.0–100.0)
Platelets: 433 10*3/uL — ABNORMAL HIGH (ref 150–400)
RBC: 2.53 MIL/uL — ABNORMAL LOW (ref 4.22–5.81)
RDW: 18.7 % — ABNORMAL HIGH (ref 11.5–15.5)
WBC: 5.8 10*3/uL (ref 4.0–10.5)
nRBC: 0 % (ref 0.0–0.2)

## 2023-01-15 MED ORDER — PANTOPRAZOLE SODIUM 40 MG PO TBEC: 40.0000 mg | DELAYED_RELEASE_TABLET | Freq: Two times a day (BID) | ORAL | Status: DC

## 2023-01-15 MED ORDER — FOLIC ACID 1 MG PO TABS: 1.0000 mg | ORAL_TABLET | Freq: Every day | ORAL | Status: DC

## 2023-01-15 MED ORDER — PANTOPRAZOLE SODIUM 40 MG PO TBEC
40.0000 mg | DELAYED_RELEASE_TABLET | Freq: Two times a day (BID) | ORAL | Status: DC
  Administered 2023-01-15 – 2023-01-16 (×2): 40 mg via ORAL
  Filled 2023-01-15 (×2): qty 1

## 2023-01-15 MED ORDER — AMIODARONE HCL 400 MG PO TABS: 400.0000 mg | ORAL_TABLET | Freq: Two times a day (BID) | ORAL | Status: DC

## 2023-01-15 MED ORDER — CYANOCOBALAMIN 500 MCG PO TABS: 500.0000 ug | ORAL_TABLET | Freq: Every day | ORAL | Status: AC

## 2023-01-15 MED ORDER — CYANOCOBALAMIN 500 MCG PO TABS: 1000.0000 ug | ORAL_TABLET | Freq: Every day | ORAL | Status: DC

## 2023-01-15 MED ORDER — PREDNISONE 50 MG PO TABS: 50.0000 mg | ORAL_TABLET | Freq: Every day | ORAL | Status: DC

## 2023-01-15 NOTE — Care Management Important Message (Signed)
 Important Message  Patient Details  Name: Justin Fleming MRN: [id number] Date of Birth: 08-25-1960   Important Message Given:  Yes - Medicare IM     Corey Harold 01/15/2023, 10:59 AM

## 2023-01-15 NOTE — Plan of Care (Signed)

## 2023-01-15 NOTE — Progress Notes (Signed)
 Tele reviewed, rates 80s to low 100s in afib which is fine for him. Continue amio 400mg  bid additional 6 days, then 200mg  bid x 2 weeks, then 200mg  daily. If demonstrates compliance with anticoagulation and tolerated at f/u could consider DCCV.   No additional cardiology recs at this time, we will sign off inpatient care and arrange f/u   Dorn Ross MD

## 2023-01-15 NOTE — Anesthesia Postprocedure Evaluation (Signed)
 Anesthesia Post Note  Patient: Shonte Soderlund Ouk  Procedure(s) Performed: ESOPHAGOGASTRODUODENOSCOPY (EGD) WITH PROPOFOL   Patient location during evaluation: Phase II Anesthesia Type: General Level of consciousness: awake Pain management: pain level controlled Vital Signs Assessment: post-procedure vital signs reviewed and stable Respiratory status: spontaneous breathing and respiratory function stable Cardiovascular status: blood pressure returned to baseline and stable Postop Assessment: no headache and no apparent nausea or vomiting Anesthetic complications: no Comments: Late entry   No notable events documented.   Last Vitals:  Vitals:   01/15/23 0710 01/15/23 1448  BP:  (!) 131/90  Pulse: (!) 115 100  Resp: 16 20  Temp:    SpO2: 95% 96%    Last Pain:  Vitals:   01/15/23 1400  TempSrc:   PainSc: 7                  Yvonna JINNY Bosworth

## 2023-01-15 NOTE — Discharge Summary (Addendum)
 Physician Discharge Summary   Patient: Justin Fleming MRN: [id number] DOB: 20-Feb-1960  Admit date:     12/29/2022  Discharge date: 01/15/23  Discharge Physician: Alm Anzlee Hinesley   PCP: Patient, No Pcp Per   Recommendations at discharge:   Please follow up with primary care provider within 1-2 weeks  Please repeat BMP and CBC in one week Maintain 2L [place] and wean for saturation >92%     Hospital Course: History limited due to patient's intoxication/AMS, obtained by patient, chart review, EDP Justin Fleming is a 63 y.o. male with hx of alcohol abuse, hypertension, mood disorder, smoking, GERD, who was brought in after bystander contacted EMS because he was sitting in his car in the parking lot.   Found have alcohol intoxication, A-fib with RVR, suspected aspiration pneumonia, and worsening lactic acidosis with fluids.  Clinically suspect decompensated heart failure.  Denies any known history of A-fib.   EKG:  A-fib with RVR rate 159, LAD, LAFB, inferior Q's, poor R wave progression, T wave inversion laterally, no overt ischemic changes.    ED Course:  Found to be in A-fib with RVR, initially treated with metoprolol  2.5 mg IV x 1 with slight worsening of pressures to systolic in the 90s.  Started on heparin  for anticoagulation.  Treated with ceftriaxone /azithromycin , 2 L IV fluid, potassium 40 mg IV and mag 4 g.  At my request consulted with cardiology fellow re: rate control options.  Cardiology ultimately placed the patient on IV amiodarone .  Because of his soft blood pressures, his heart rates have been difficult to control.  The patient received intermittent boluses of amiodarone .  GI was consulted also secondary to heme positive stool and dropping hemoglobin.  EGD was performed and showed gastritis.  The patient refused colonoscopy during hospitalization.       Assessment and Plan: Acute HFpEF -12/29/2022 echo EF 55-60%, no WMA, trivial MR -Continue furosemide  -Appreciate  cardiology --15.8 L since admission -Serum creatinine stable   New onset atrial fibrillation with RVR, type unspecified -Initially on diltiazem  drip -Difficult to manage rate secondary to soft BPs -Apixaban  has been started as hemoglobin remained stable levels--started 01/09/2023  -Continue midodrine  -Transition to oral amiodarone  per cardiology --Not a candidate for cardioversion as to be difficult to commit to long-term anticoagulation post cardioversion in the setting of GI bleed without endoluminal evaluation  -amiodarone  400 bid x 6 more days, then 200 mg bid x 14 days, then 200 mg daily    Fever/Gouty Arthritis flare -UA negative for pyuria -Personally reviewed chest x-ray--no consolidations -Blood culture x 2 sets--neg -COVID-19 PCR negative -check RVP--neg -PCT<0.10 -uric acid--9.5 -xray right knee--mod joint effusion, +OA; 3 mm apparent metallic density overlies the bone of the proximal to mid tibial diaphysis -started prednisone >>plan 3 more days after dc -right knee pain much improved with prednisone    Acute respiratory failure with hypoxia -Secondary to CHF -Stable on 2 L   Alcohol intoxication -No sign of withdrawal presently -Initially on CIWA -Continue thiamine  and multivitamin -Reports drinking up to 1.75 L of liquor a day, no known history of severe withdrawal in the past /seizure/DT.   Acute anemia with FOBT positive -01/08/2023 EGD--gastritis, normal duodenum -Patient refused colonoscopy -Hemoglobin--overall stable -Monitor hemoglobin on apixaban  -No overt signs of bleeding at this time   Capacity evaluation----per Dr. Rendall Fleming: Earlier on tn this admission Patient was found incompetent due to intermittent confusion and forgetfulness He Discussed it with sisters ---Justin Fleming and Justin Fleming as well as Justin Rush  Fleming and Niece-Justin Fleming--  at bedside--- they have offered to make medical decisions for patient   - TSH 2.7  - Folate 3.6, B12 -158, - HIV,  RPR>> nonreactive -On 01/12/2023, the patient was deemed to have capacity to make decisions Low B12 /folic acid  -Repleting        Consultants: cardiology, GI Procedures performed: EGD 1/3  Disposition: Skilled nursing facility Diet recommendation:  Discharge Diet Orders (From admission, onward)     Start     Ordered   01/11/23 0000  Diet - low sodium heart healthy        01/11/23 0818           Cardiac diet DISCHARGE MEDICATION: Allergies as of 01/15/2023       Reactions   Chlorhexidine  Gluconate Rash        Medication List     TAKE these medications    acetaminophen  500 MG tablet Commonly known as: TYLENOL  Take 500 mg by mouth every 6 (six) hours as needed for moderate pain (pain score 4-6).   amiodarone  400 MG tablet Commonly known as: PACERONE  Take 1 tablet (400 mg total) by mouth 2 (two) times daily. X 6 days, then 200 mg bid x 14 days, then 200 mg daily   apixaban  5 MG Tabs tablet Commonly known as: ELIQUIS  Take 1 tablet (5 mg total) by mouth 2 (two) times daily.   cyanocobalamin  500 MCG tablet Commonly known as: VITAMIN B12 Take 1 tablet (500 mcg total) by mouth daily.   ferrous sulfate  325 (65 FE) MG tablet Take 1 tablet (325 mg total) by mouth 2 (two) times daily with a meal.   folic acid  1 MG tablet Commonly known as: FOLVITE  Take 1 tablet (1 mg total) by mouth daily.   furosemide  40 MG tablet Commonly known as: LASIX  Take 1 tablet (40 mg total) by mouth daily.   midodrine  5 MG tablet Commonly known as: PROAMATINE  Take 3 tablets (15 mg total) by mouth 3 (three) times daily with meals.   multivitamin with minerals Tabs tablet Take 1 tablet by mouth daily.   pantoprazole  40 MG tablet Commonly known as: PROTONIX  Take 1 tablet (40 mg total) by mouth 2 (two) times daily.   predniSONE  50 MG tablet Commonly known as: DELTASONE  Take 1 tablet (50 mg total) by mouth daily with breakfast. X 3 days        Contact information for  after-discharge care     Destination     [facility] .   Service: Skilled Nursing Contact information: [address] [facility] [place]  [address] [phone]                    Discharge Exam: Filed Weights   01/12/23 0631 01/13/23 0417 01/15/23 0500  Weight: 90.5 kg 92.5 kg 89.7 kg   HEENT:  [place]/AT, No thrush, no icterus CV:  RRR, no rub, no S3, no S4 Lung:  bibasilar rales.  No wheeze Abd:  soft/+BS, NT Ext:  No edema, no lymphangitis, no synovitis, no rash   Condition at discharge: stable  The results of significant diagnostics from this hospitalization (including imaging, microbiology, ancillary and laboratory) are listed below for reference.   Imaging Studies: DG Knee 1-2 Views Right Result Date: 01/13/2023 CLINICAL DATA:  Right knee pain and swelling. Limited range of motion. No known injury. EXAM: RIGHT KNEE - 1-2 VIEW COMPARISON:  None Available. FINDINGS: Mildly decreased bone mineralization. Borderline mild patella alta with Insall-Salvati  ratio measuring 1.3. Mild patellofemoral joint space narrowing. Moderate joint effusion. Minimal chronic enthesopathic change at the quadriceps insertion on the patella. The medial and lateral compartment joint spaces are maintained. A 3 mm apparent metallic density overlies the bone of the proximal to mid tibial diaphysis on lateral view, not included on frontal view. No acute fracture dislocation. IMPRESSION: 1. Mild patellofemoral osteoarthritis. 2. Moderate joint effusion. 3. Borderline mild patella alta. 4. A 3 mm apparent metallic density overlies the bone of the proximal to mid tibial diaphysis on lateral view, not included on frontal view. Recommend clinical correlation for prior trauma or procedure in this region of the bone/calf. Electronically Signed   By: Tanda Lyons M.D.   On: 01/13/2023 17:56   DG CHEST PORT 1 VIEW Result Date: 01/11/2023 CLINICAL DATA:  Fevers EXAM: PORTABLE CHEST 1  VIEW COMPARISON:  01/08/2023 FINDINGS: Cardiac shadow is within normal limits. Lungs are well aerated bilaterally. Elevation of the left hemidiaphragm is seen. Right PICC is seen with the catheter tip in the superior right atrium. This is stable from the prior study. No other focal abnormality is seen. IMPRESSION: No change from the previous day. Electronically Signed   By: Oneil Devonshire M.D.   On: 01/11/2023 20:47   DG CHEST PORT 1 VIEW Result Date: 01/08/2023 CLINICAL DATA:  Dyspnea, respiratory abnormalities. EXAM: PORTABLE CHEST 1 VIEW COMPARISON:  X-ray 01/05/2023 and older. FINDINGS: Underinflation. Slight elevation of the left hemidiaphragm. Stable enlarged cardiopericardial silhouette with a tortuous and ectatic aorta. Tiny pleural effusions. Bandlike changes at the bases. No pneumothorax. Overlapping cardiac leads. Right-sided PICC with the tip along the SVC right atrial junction. Overall opacities are slightly improving from previous. IMPRESSION: Slight interval reduction of the lung base opacities and effusions with some residual. Recommend continued follow up. Decreasing vascular congestion. Electronically Signed   By: Ranell Bring M.D.   On: 01/08/2023 11:27   DG CHEST PORT 1 VIEW Result Date: 01/05/2023 CLINICAL DATA:  Dyspnea. EXAM: PORTABLE CHEST 1 VIEW COMPARISON:  January 03, 2023. FINDINGS: Stable cardiomegaly. Mild central pulmonary vascular congestion is noted. Hypoinflation of the lungs is noted with bibasilar atelectasis or edema with associated effusions. Bony thorax is unremarkable. Right-sided PICC line is unchanged. IMPRESSION: Stable cardiomegaly with mild central pulmonary vascular congestion. Hypoinflation of the lungs is noted with bibasilar atelectasis or edema with associated effusions. Electronically Signed   By: Lynwood Landy Raddle M.D.   On: 01/05/2023 10:50   US  Abdomen Limited RUQ (LIVER/GB) Result Date: 01/04/2023 CLINICAL DATA:  Liver disease. EXAM: ULTRASOUND ABDOMEN  LIMITED RIGHT UPPER QUADRANT COMPARISON:  Ultrasound 2016 FINDINGS: Gallbladder: Gallbladder is underdistended. Sludge and stones suggested. The wall thickening of the gallbladder could relate to the patient's history of chronic liver disease in the contracted state. Evaluation for Murphy's sign is limited as the patient could not responding to commands. Common bile duct: Diameter: 4 mm Liver: Echogenic hepatic parenchyma consistent with fatty liver infiltration. Please correlate for known history of chronic liver disease. If there is concern further of underlying mass lesion additional workup with a contrast study is recommended. Portal vein is patent on color Doppler imaging with normal direction of blood flow towards the liver. Other: Mild ascites. IMPRESSION: Echogenic liver.  Mild ascites. Contracted gallbladder with stones and sludge. No ductal dilatation. Electronically Signed   By: Ranell Bring M.D.   On: 01/04/2023 15:38   DG CHEST PORT 1 VIEW Result Date: 01/03/2023 CLINICAL DATA:  [id number] Dyspnea and respiratory abnormalities [id number]  EXAM: PORTABLE CHEST 1 VIEW COMPARISON:  12/30/2022. FINDINGS: Low lung volume. Mild-to-moderate diffuse pulmonary vascular congestion without significant interval change. There are bilateral small layering pleural effusions with probable associated compressive atelectatic changes. No pneumothorax. Stable cardio-mediastinal silhouette. No acute osseous abnormalities. The soft tissues are within normal limits. Right-sided PICC line is seen with its tip overlying the cavoatrial junction region. IMPRESSION: *Findings favor congestive heart failure/pulmonary edema, without significant interval change. Electronically Signed   By: Ree Molt M.D.   On: 01/03/2023 08:54   CT Angio Chest Pulmonary Embolism (PE) W or WO Contrast Result Date: 12/31/2022 CLINICAL DATA:  Elevated D-dimer and peripheral swelling EXAM: CT ANGIOGRAPHY CHEST WITH CONTRAST TECHNIQUE: Multidetector  CT imaging of the chest was performed using the standard protocol during bolus administration of intravenous contrast. Multiplanar CT image reconstructions and MIPs were obtained to evaluate the vascular anatomy. RADIATION DOSE REDUCTION: This exam was performed according to the departmental dose-optimization program which includes automated exposure control, adjustment of the mA and/or kV according to patient size and/or use of iterative reconstruction technique. CONTRAST:  OMNIPAQUE  IOHEXOL  350 MG/ML SOLN COMPARISON:  Chest x-ray from the previous day. FINDINGS: Cardiovascular: Thoracic aorta shows a normal branching pattern. No aneurysmal dilatation or dissection is noted. Heart is at the upper limits of normal in size. The pulmonary artery shows a normal branching pattern bilaterally. No filling defect to suggest pulmonary embolism is noted. Coronary calcifications are seen. Right PICC is noted. Mediastinum/Nodes: Thoracic inlet is within normal limits. No hilar or mediastinal adenopathy is noted. The esophagus as visualized is within normal limits. Lungs/Pleura: Small left-sided pleural effusion is noted. Tiny right-sided pleural effusion is noted with the predominant lateral component. Bilateral lower lobe atelectasis is seen. No focal confluent infiltrate is noted. No parenchymal nodules are noted. Upper Abdomen: No acute abnormality. Musculoskeletal: Degenerative changes of the thoracic spine are noted. Review of the MIP images confirms the above findings. IMPRESSION: Small pleural effusions, left greater than right with lower lobe atelectasis. No evidence of pulmonary emboli. Electronically Signed   By: Oneil Devonshire M.D.   On: 12/31/2022 18:53   US  Venous Img Lower Bilateral (DVT) Result Date: 12/31/2022 CLINICAL DATA:  Lower extremity swelling EXAM: BILATERAL LOWER EXTREMITY VENOUS DOPPLER ULTRASOUND TECHNIQUE: Gray-scale sonography with graded compression, as well as color Doppler and duplex  ultrasound were performed to evaluate the lower extremity deep venous systems from the level of the common femoral vein and including the common femoral, femoral, profunda femoral, popliteal and calf veins including the posterior tibial, peroneal and gastrocnemius veins when visible. The superficial great saphenous vein was also interrogated. Spectral Doppler was utilized to evaluate flow at rest and with distal augmentation maneuvers in the common femoral, femoral and popliteal veins. COMPARISON:  None Available. FINDINGS: RIGHT LOWER EXTREMITY Common Femoral Vein: No evidence of thrombus. Normal compressibility, respiratory phasicity and response to augmentation. Saphenofemoral Junction: No evidence of thrombus. Normal compressibility and flow on color Doppler imaging. Profunda Femoral Vein: No evidence of thrombus. Normal compressibility and flow on color Doppler imaging. Femoral Vein: No evidence of thrombus. Normal compressibility, respiratory phasicity and response to augmentation. Popliteal Vein: No evidence of thrombus. Normal compressibility, respiratory phasicity and response to augmentation. Calf Veins: No evidence of thrombus. Normal compressibility and flow on color Doppler imaging. Superficial Great Saphenous Vein: No evidence of thrombus. Normal compressibility and flow on color Doppler imaging. Venous Reflux:  None. Other Findings:  Lower extremity edema is noted. LEFT LOWER EXTREMITY Common Femoral Vein:  No evidence of thrombus. Normal compressibility, respiratory phasicity and response to augmentation. Saphenofemoral Junction: No evidence of thrombus. Normal compressibility and flow on color Doppler imaging. Profunda Femoral Vein: No evidence of thrombus. Normal compressibility and flow on color Doppler imaging. Femoral Vein: No evidence of thrombus. Normal compressibility, respiratory phasicity and response to augmentation. Popliteal Vein: No evidence of thrombus. Normal compressibility,  respiratory phasicity and response to augmentation. Calf Veins: No evidence of thrombus. Normal compressibility and flow on color Doppler imaging. Superficial Great Saphenous Vein: No evidence of thrombus. Normal compressibility and flow on color Doppler imaging. Venous Reflux:  None. Other Findings:  Lower extremity edema is noted. IMPRESSION: No evidence of deep venous thrombosis. Electronically Signed   By: Oneil Devonshire M.D.   On: 12/31/2022 18:49   ECHOCARDIOGRAM COMPLETE Result Date: 12/31/2022    ECHOCARDIOGRAM REPORT   Patient Name:   TRAJAN GROVE Date of Exam: 12/30/2022 Medical Rec #:  [id number]        Height:       70.0 in Accession #:    [id number]       Weight:       218.0 lb Date of Birth:  1960/05/04        BSA:          2.165 m Patient Age:    62 years         BP:           119/81 mmHg Patient Gender: M                HR:           94 bpm. Exam Location:  [facility] Procedure: 2D Echo, Cardiac Doppler, Color Doppler and Intracardiac            Opacification Agent Indications:    Congestive Heart Failure I50.9  History:        Patient has no prior history of Echocardiogram examinations.                 Risk Factors:Hypertension and Current Smoker.  Sonographer:    Aida Pizza RCS Referring Phys: [id number] [facility]  Sonographer Comments: Technically difficult study due to poor echo windows. IMPRESSIONS  1. Left ventricular ejection fraction, by estimation, is 55 to 60%. The left ventricle has normal function. The left ventricle has no regional wall motion abnormalities. There is mild concentric left ventricular hypertrophy. Left ventricular diastolic parameters are indeterminate.  2. Right ventricular systolic function was not well visualized. The right ventricular size is not well visualized. Tricuspid regurgitation signal is inadequate for assessing PA pressure.  3. Left atrial size was severely dilated.  4. Right atrial size was moderately dilated.  5. The mitral valve is grossly  normal. Trivial mitral valve regurgitation.  6. The aortic valve was not well visualized. Aortic valve regurgitation is not visualized.  7. The inferior vena cava is dilated in size with <50% respiratory variability, suggesting right atrial pressure of 15 mmHg. Comparison(s): No prior Echocardiogram. FINDINGS  Left Ventricle: Left ventricular ejection fraction, by estimation, is 55 to 60%. The left ventricle has normal function. The left ventricle has no regional wall motion abnormalities. Definity  contrast agent was given IV to delineate the left ventricular  endocardial borders. The left ventricular internal cavity size was normal in size. There is mild concentric left ventricular hypertrophy. Left ventricular diastolic function could not be evaluated due to atrial fibrillation. Left ventricular diastolic parameters are indeterminate. Right Ventricle: The right ventricular  size is not well visualized. Right vetricular wall thickness was not well visualized. Right ventricular systolic function was not well visualized. Tricuspid regurgitation signal is inadequate for assessing PA pressure. Left Atrium: Left atrial size was severely dilated. Right Atrium: Right atrial size was moderately dilated. Pericardium: There is no evidence of pericardial effusion. Mitral Valve: The mitral valve is grossly normal. Trivial mitral valve regurgitation. Tricuspid Valve: The tricuspid valve is grossly normal. Tricuspid valve regurgitation is trivial. Aortic Valve: The aortic valve was not well visualized. Aortic valve regurgitation is not visualized. Pulmonic Valve: The pulmonic valve was not well visualized. Pulmonic valve regurgitation is not visualized. Aorta: The aortic root is normal in size and structure. Venous: The inferior vena cava is dilated in size with less than 50% respiratory variability, suggesting right atrial pressure of 15 mmHg. IAS/Shunts: No atrial level shunt detected by color flow Doppler.  LEFT VENTRICLE PLAX  2D LVIDd:         3.40 cm LVIDs:         2.40 cm LV PW:         1.10 cm LV IVS:        1.10 cm LVOT diam:     1.80 cm LV SV:         44 LV SV Index:   20 LVOT Area:     2.54 cm  RIGHT VENTRICLE TAPSE (M-mode): 1.2 cm LEFT ATRIUM              Index        RIGHT ATRIUM           Index LA diam:        2.80 cm  1.29 cm/m   RA Area:     26.20 cm LA Vol (A2C):   115.0 ml 53.11 ml/m  RA Volume:   90.00 ml  41.56 ml/m LA Vol (A4C):   112.0 ml 51.72 ml/m LA Biplane Vol: 119.0 ml 54.96 ml/m  AORTIC VALVE LVOT Vmax:   97.50 cm/s LVOT Vmean:  72.800 cm/s LVOT VTI:    0.173 m  AORTA Ao Root diam: 3.10 cm MITRAL VALVE MV Area (PHT): 4.21 cm     SHUNTS MV Decel Time: 180 msec     Systemic VTI:  0.17 m MV E velocity: 127.00 cm/s  Systemic Diam: 1.80 cm Jayson Sierras MD Electronically signed by Jayson Sierras MD Signature Date/Time: 12/31/2022/1:36:31 PM    Final    DG CHEST PORT 1 VIEW Result Date: 12/30/2022 CLINICAL DATA:  PICC line placement. EXAM: PORTABLE CHEST 1 VIEW COMPARISON:  One-view chest x-ray 12/29/2022 FINDINGS: Heart is enlarged. Right pleural effusion remains. Mild pulmonary vascular congestion is present bilaterally. A new right-sided PICC line is in place. The tip is at the cavoatrial junction. No pneumothorax is present. The visualized soft tissues and bony thorax are otherwise unremarkable. IMPRESSION: 1. New right-sided PICC line with tip at the cavoatrial junction. 2. Cardiomegaly and mild pulmonary vascular congestion. 3. Persistent right pleural effusion. Electronically Signed   By: Lonni Necessary M.D.   On: 12/30/2022 16:25   US  EKG SITE RITE Result Date: 12/30/2022 If Site Rite image not attached, placement could not be confirmed due to current cardiac rhythm.  DG Chest Port 1 View Result Date: 12/29/2022 CLINICAL DATA:  Altered mental status. EXAM: PORTABLE CHEST 1 VIEW COMPARISON:  11/05/2014 FINDINGS: Extremely shallow inspiration. Heart size and pulmonary vascularity are  probably normal for technique. Suggestion of a right pleural effusion with basilar atelectasis  or infiltration. No visible pneumothorax. IMPRESSION: Shallow inspiration. Right pleural effusion with basilar atelectasis or infiltration. Electronically Signed   By: Elsie Gravely M.D.   On: 12/29/2022 23:33   CT Head Wo Contrast Result Date: 12/29/2022 CLINICAL DATA:  Mental status change, unknown cause EXAM: CT HEAD WITHOUT CONTRAST TECHNIQUE: Contiguous axial images were obtained from the base of the skull through the vertex without intravenous contrast. RADIATION DOSE REDUCTION: This exam was performed according to the departmental dose-optimization program which includes automated exposure control, adjustment of the mA and/or kV according to patient size and/or use of iterative reconstruction technique. COMPARISON:  None Available. FINDINGS: Brain: Mild diffuse cerebral atrophy. No acute intracranial abnormality. Specifically, no hemorrhage, hydrocephalus, mass lesion, acute infarction, or significant intracranial injury. Vascular: No hyperdense vessel or unexpected calcification. Skull: No acute calvarial abnormality. Sinuses/Orbits: No acute findings Other: None IMPRESSION: No acute intracranial abnormality. Mild atrophy. Electronically Signed   By: Franky Crease M.D.   On: 12/29/2022 23:28    Microbiology: Results for orders placed or performed during the hospital encounter of 12/29/22  Culture, blood (routine x 2)     Status: None   Collection Time: 12/30/22 12:51 AM   Specimen: BLOOD  Result Value Ref Range Status   Specimen Description BLOOD BLOOD RIGHT ARM  Final   Special Requests   Final    BOTTLES DRAWN AEROBIC ONLY Blood Culture results may not be optimal due to an inadequate volume of blood received in culture bottles   Culture   Final    NO GROWTH 5 DAYS Performed at [facility], [address], [place], [place] [address]    Report Status 01/04/2023 FINAL  Final  Culture, blood  (routine x 2)     Status: None   Collection Time: 12/30/22 12:55 AM   Specimen: BLOOD LEFT FOREARM  Result Value Ref Range Status   Specimen Description BLOOD LEFT FOREARM  Final   Special Requests   Final    BOTTLES DRAWN AEROBIC AND ANAEROBIC Blood Culture results may not be optimal due to an inadequate volume of blood received in culture bottles   Culture   Final    NO GROWTH 5 DAYS Performed at [facility], [address] [place], [place] [address]    Report Status 01/04/2023 FINAL  Final  Respiratory (~20 pathogens) panel by PCR     Status: None   Collection Time: 12/30/22  3:59 AM   Specimen: Nasopharyngeal Swab; Respiratory  Result Value Ref Range Status   Adenovirus NOT DETECTED NOT DETECTED Final   Coronavirus 229E NOT DETECTED NOT DETECTED Final    Comment: (NOTE) The Coronavirus on the Respiratory Panel, DOES NOT test for the novel  Coronavirus (2019 nCoV)    Coronavirus HKU1 NOT DETECTED NOT DETECTED Final   Coronavirus NL63 NOT DETECTED NOT DETECTED Final   Coronavirus OC43 NOT DETECTED NOT DETECTED Final   Metapneumovirus NOT DETECTED NOT DETECTED Final   Rhinovirus / Enterovirus NOT DETECTED NOT DETECTED Final   Influenza A NOT DETECTED NOT DETECTED Final   Influenza B NOT DETECTED NOT DETECTED Final   Parainfluenza Virus 1 NOT DETECTED NOT DETECTED Final   Parainfluenza Virus 2 NOT DETECTED NOT DETECTED Final   Parainfluenza Virus 3 NOT DETECTED NOT DETECTED Final   Parainfluenza Virus 4 NOT DETECTED NOT DETECTED Final   Respiratory Syncytial Virus NOT DETECTED NOT DETECTED Final   Bordetella pertussis NOT DETECTED NOT DETECTED Final   Bordetella Parapertussis NOT DETECTED NOT DETECTED Final  Chlamydophila pneumoniae NOT DETECTED NOT DETECTED Final   Mycoplasma pneumoniae NOT DETECTED NOT DETECTED Final    Comment: Performed at [facility], [address] [place], [place] [address]  MRSA Next Gen by PCR, Nasal     Status: None   Collection Time:  12/30/22  9:06 AM   Specimen: Nasal Mucosa; Nasal Swab  Result Value Ref Range Status   MRSA by PCR Next Gen NOT DETECTED NOT DETECTED Final    Comment: (NOTE) The GeneXpert MRSA Assay (FDA approved for NASAL specimens only), is one component of a comprehensive MRSA colonization surveillance program. It is not intended to diagnose MRSA infection nor to guide or monitor treatment for MRSA infections. Test performance is not FDA approved in patients less than 14 years old. Performed at [facility], [address], [place], [place] [address]   SARS Coronavirus 2 by RT PCR (hospital order, performed in [facility] hospital lab) *cepheid single result test* Anterior Nasal Swab     Status: None   Collection Time: 12/31/22 12:31 PM   Specimen: Anterior Nasal Swab  Result Value Ref Range Status   SARS Coronavirus 2 by RT PCR NEGATIVE NEGATIVE Final    Comment: (NOTE) SARS-CoV-2 target nucleic acids are NOT DETECTED.  The SARS-CoV-2 RNA is generally detectable in upper and lower respiratory specimens during the acute phase of infection. The lowest concentration of SARS-CoV-2 viral copies this assay can detect is 250 copies / mL. A negative result does not preclude SARS-CoV-2 infection and should not be used as the sole basis for treatment or other patient management decisions.  A negative result may occur with improper specimen collection / handling, submission of specimen other than nasopharyngeal swab, presence of viral mutation(s) within the areas targeted by this assay, and inadequate number of viral copies (<250 copies / mL). A negative result must be combined with clinical observations, patient history, and epidemiological information.  Fact Sheet for Patients:   roadlaptop.co.za  Fact Sheet for Healthcare Providers: http://kim-miller.com/  This test is not yet approved or  cleared by the [place]  FDA and has been authorized for  detection and/or diagnosis of SARS-CoV-2 by FDA under an Emergency Use Authorization (EUA).  This EUA will remain in effect (meaning this test can be used) for the duration of the COVID-19 declaration under Section 564(b)(1) of the Act, 21 U.S.C. section 360bbb-3(b)(1), unless the authorization is terminated or revoked sooner.  Performed at [facility], [address] [place], [place] [address]   Culture, blood (Routine X 2) w Reflex to ID Panel     Status: None   Collection Time: 01/04/23  4:19 PM   Specimen: BLOOD LEFT HAND  Result Value Ref Range Status   Specimen Description BLOOD LEFT HAND  Final   Special Requests   Final    BOTTLES DRAWN AEROBIC AND ANAEROBIC Blood Culture adequate volume   Culture   Final    NO GROWTH 5 DAYS Performed at [facility], [address] [place], [place] [address]    Report Status 01/09/2023 FINAL  Final  Culture, blood (Routine X 2) w Reflex to ID Panel     Status: None   Collection Time: 01/04/23  4:19 PM   Specimen: BLOOD LEFT WRIST  Result Value Ref Range Status   Specimen Description BLOOD LEFT WRIST  Final   Special Requests   Final    BOTTLES DRAWN AEROBIC AND ANAEROBIC Blood Culture adequate volume   Culture   Final  NO GROWTH 5 DAYS Performed at [facility], [address], [place], [place] [address]    Report Status 01/09/2023 FINAL  Final  Resp panel by RT-PCR (RSV, Flu A&B, Covid) Anterior Nasal Swab     Status: None   Collection Time: 01/11/23  8:50 PM   Specimen: Anterior Nasal Swab  Result Value Ref Range Status   SARS Coronavirus 2 by RT PCR NEGATIVE NEGATIVE Final    Comment: (NOTE) SARS-CoV-2 target nucleic acids are NOT DETECTED.  The SARS-CoV-2 RNA is generally detectable in upper respiratory specimens during the acute phase of infection. The lowest concentration of SARS-CoV-2 viral copies this assay can detect is 138 copies/mL. A negative result does not preclude SARS-Cov-2 infection and should not be used as  the sole basis for treatment or other patient management decisions. A negative result may occur with  improper specimen collection/handling, submission of specimen other than nasopharyngeal swab, presence of viral mutation(s) within the areas targeted by this assay, and inadequate number of viral copies(<138 copies/mL). A negative result must be combined with clinical observations, patient history, and epidemiological information. The expected result is Negative.  Fact Sheet for Patients:  bloggercourse.com  Fact Sheet for Healthcare Providers:  seriousbroker.it  This test is no t yet approved or cleared by the [place]  FDA and  has been authorized for detection and/or diagnosis of SARS-CoV-2 by FDA under an Emergency Use Authorization (EUA). This EUA will remain  in effect (meaning this test can be used) for the duration of the COVID-19 declaration under Section 564(b)(1) of the Act, 21 U.S.C.section 360bbb-3(b)(1), unless the authorization is terminated  or revoked sooner.       Influenza A by PCR NEGATIVE NEGATIVE Final   Influenza B by PCR NEGATIVE NEGATIVE Final    Comment: (NOTE) The Xpert Xpress SARS-CoV-2/FLU/RSV plus assay is intended as an aid in the diagnosis of influenza from Nasopharyngeal swab specimens and should not be used as a sole basis for treatment. Nasal washings and aspirates are unacceptable for Xpert Xpress SARS-CoV-2/FLU/RSV testing.  Fact Sheet for Patients: bloggercourse.com  Fact Sheet for Healthcare Providers: seriousbroker.it  This test is not yet approved or cleared by the [place]  FDA and has been authorized for detection and/or diagnosis of SARS-CoV-2 by FDA under an Emergency Use Authorization (EUA). This EUA will remain in effect (meaning this test can be used) for the duration of the COVID-19 declaration under Section 564(b)(1) of  the Act, 21 U.S.C. section 360bbb-3(b)(1), unless the authorization is terminated or revoked.     Resp Syncytial Virus by PCR NEGATIVE NEGATIVE Final    Comment: (NOTE) Fact Sheet for Patients: bloggercourse.com  Fact Sheet for Healthcare Providers: seriousbroker.it  This test is not yet approved or cleared by the [place]  FDA and has been authorized for detection and/or diagnosis of SARS-CoV-2 by FDA under an Emergency Use Authorization (EUA). This EUA will remain in effect (meaning this test can be used) for the duration of the COVID-19 declaration under Section 564(b)(1) of the Act, 21 U.S.C. section 360bbb-3(b)(1), unless the authorization is terminated or revoked.  Performed at [facility], [address], [place], [place] [address]   Respiratory (~20 pathogens) panel by PCR     Status: None   Collection Time: 01/13/23  8:45 AM   Specimen: Nasopharyngeal Swab; Respiratory  Result Value Ref Range Status   Adenovirus NOT DETECTED NOT DETECTED Final   Coronavirus 229E NOT DETECTED NOT DETECTED Final    Comment: (NOTE) The Coronavirus  on the Respiratory Panel, DOES NOT test for the novel  Coronavirus (2019 nCoV)    Coronavirus HKU1 NOT DETECTED NOT DETECTED Final   Coronavirus NL63 NOT DETECTED NOT DETECTED Final   Coronavirus OC43 NOT DETECTED NOT DETECTED Final   Metapneumovirus NOT DETECTED NOT DETECTED Final   Rhinovirus / Enterovirus NOT DETECTED NOT DETECTED Final   Influenza A NOT DETECTED NOT DETECTED Final   Influenza B NOT DETECTED NOT DETECTED Final   Parainfluenza Virus 1 NOT DETECTED NOT DETECTED Final   Parainfluenza Virus 2 NOT DETECTED NOT DETECTED Final   Parainfluenza Virus 3 NOT DETECTED NOT DETECTED Final   Parainfluenza Virus 4 NOT DETECTED NOT DETECTED Final   Respiratory Syncytial Virus NOT DETECTED NOT DETECTED Final   Bordetella pertussis NOT DETECTED NOT DETECTED Final   Bordetella  Parapertussis NOT DETECTED NOT DETECTED Final   Chlamydophila pneumoniae NOT DETECTED NOT DETECTED Final   Mycoplasma pneumoniae NOT DETECTED NOT DETECTED Final    Comment: Performed at [facility], [address], [place], [place] [address]  Culture, blood (Routine X 2) w Reflex to ID Panel     Status: None (Preliminary result)   Collection Time: 01/13/23  4:03 PM   Specimen: BLOOD LEFT HAND  Result Value Ref Range Status   Specimen Description BLOOD LEFT HAND  Final   Special Requests   Final    BOTTLES DRAWN AEROBIC AND ANAEROBIC Blood Culture adequate volume   Culture   Final    NO GROWTH 2 DAYS Performed at [facility], [address], [place], [place] [address]    Report Status PENDING  Incomplete  Culture, blood (Routine X 2) w Reflex to ID Panel     Status: None (Preliminary result)   Collection Time: 01/13/23  4:03 PM   Specimen: BLOOD  Result Value Ref Range Status   Specimen Description BLOOD BLOOD LEFT HAND  Final   Special Requests NONE  Final   Culture   Final    NO GROWTH 2 DAYS Performed at [facility], [address], [place], [place] [address]    Report Status PENDING  Incomplete    Labs: CBC: Recent Labs  Lab 01/10/23 0606 01/11/23 0409 01/13/23 0418 01/14/23 0440 01/15/23 0457  WBC 9.8 9.4 10.8* 9.2 5.8  HGB 8.7* 8.5* 8.3* 8.0* 8.4*  HCT 27.2* 26.3* 25.7* 26.0* 26.1*  MCV 103.8* 105.2* 104.9* 104.4* 103.2*  PLT 378 363 412* 429* 433*   Basic Metabolic Panel: Recent Labs  Lab 01/10/23 0755 01/11/23 0409 01/12/23 0449 01/13/23 0418 01/14/23 0440 01/15/23 0457  NA  --  136 131* 135 133* 130*  K  --  3.8 3.6 3.5 3.2* 3.7  CL  --  88* 86* 87* 87* 88*  CO2  --  38* 40* 38* 37* 34*  GLUCOSE  --  105* 104* 104* 102* 184*  BUN  --  16 19 20 19 22   CREATININE  --  1.10 1.13 1.23 1.07 1.18  CALCIUM  --  9.0 8.7* 8.9 8.6* 8.7*  MG 2.0  --   --   --  1.8 2.0   Liver Function Tests: No results for input(s): AST, ALT, ALKPHOS,  BILITOT, PROT, ALBUMIN  in the last 168 hours. CBG: No results for input(s): GLUCAP in the last 168 hours.  Discharge time spent: greater than 30 minutes.  Signed: Alm Schneider, MD [facility] 01/15/2023

## 2023-01-15 NOTE — TOC Transition Note (Signed)
 Transition of Care [facility]) - Discharge Note   Patient Details  Name: Justin Fleming MRN: [id number] Date of Birth: March 31, 1960  Transition of Care [facility]) CM/SW Contact:  Sharlyne Stabs, RN Phone Number: 01/15/2023, 11:45 AM   Clinical Narrative:   Patient medically ready to discharge to [facility]. TOC waiting on bed number. RN will call report. TOC will schedule EMS. DC clinicals sent in the hub. Sister updated.    Final next level of care: Skilled Nursing Facility Barriers to Discharge: Barriers Resolved   Patient Goals and CMS Choice Patient states their goals for this hospitalization and ongoing recovery are:: agreeable to SNF CMS Medicare.gov Compare Post Acute Care list provided to:: Patient Represenative (must comment) Choice offered to / list presented to : Sibling    Discharge Placement             Patient to be transferred to facility by: EMS Name of family member notified: Sister Patient and family notified of of transfer: 01/15/23  Discharge Plan and Services Additional resources added to the After Visit Summary for       Social Drivers of Health (SDOH) Interventions SDOH Screenings   Food Insecurity: No Food Insecurity (12/30/2022)  Housing: Low Risk  (12/30/2022)  Transportation Needs: No Transportation Needs (12/30/2022)  Utilities: Not At Risk (12/30/2022)  Alcohol Screen: Low Risk  (02/02/2018)  Depression (PHQ2-9): Low Risk  (02/02/2018)  Tobacco Use: Medium Risk (01/08/2023)     Readmission Risk Interventions    01/11/2023   12:44 PM  Readmission Risk Prevention Plan  Transportation Screening Complete  PCP or Specialist Appt within 5-7 Days Not Complete  Home Care Screening Complete  Medication Review (RN CM) Complete

## 2023-01-16 DIAGNOSIS — R6520 Severe sepsis without septic shock: Secondary | ICD-10-CM

## 2023-01-16 DIAGNOSIS — A419 Sepsis, unspecified organism: Secondary | ICD-10-CM | POA: Diagnosis not present

## 2023-01-16 DIAGNOSIS — F1010 Alcohol abuse, uncomplicated: Secondary | ICD-10-CM | POA: Diagnosis not present

## 2023-01-16 DIAGNOSIS — I5031 Acute diastolic (congestive) heart failure: Secondary | ICD-10-CM | POA: Diagnosis not present

## 2023-01-16 DIAGNOSIS — I4891 Unspecified atrial fibrillation: Secondary | ICD-10-CM | POA: Diagnosis not present

## 2023-01-16 LAB — CBC
HCT: 26.3 % — ABNORMAL LOW (ref 39.0–52.0)
Hemoglobin: 8.2 g/dL — ABNORMAL LOW (ref 13.0–17.0)
MCH: 32.2 pg (ref 26.0–34.0)
MCHC: 31.2 g/dL (ref 30.0–36.0)
MCV: 103.1 fL — ABNORMAL HIGH (ref 80.0–100.0)
Platelets: 458 10*3/uL — ABNORMAL HIGH (ref 150–400)
RBC: 2.55 MIL/uL — ABNORMAL LOW (ref 4.22–5.81)
RDW: 18.8 % — ABNORMAL HIGH (ref 11.5–15.5)
WBC: 7.8 10*3/uL (ref 4.0–10.5)
nRBC: 0 % (ref 0.0–0.2)

## 2023-01-16 LAB — BASIC METABOLIC PANEL
Anion gap: 12 (ref 5–15)
BUN: 22 mg/dL (ref 8–23)
CO2: 34 mmol/L — ABNORMAL HIGH (ref 22–32)
Calcium: 9.1 mg/dL (ref 8.9–10.3)
Chloride: 88 mmol/L — ABNORMAL LOW (ref 98–111)
Creatinine, Ser: 0.96 mg/dL (ref 0.61–1.24)
GFR, Estimated: >60 mL/min (ref >60)
Glucose, Bld: 126 mg/dL — ABNORMAL HIGH (ref 70–99)
Potassium: 3.4 mmol/L — ABNORMAL LOW (ref 3.5–5.1)
Sodium: 134 mmol/L — ABNORMAL LOW (ref 135–145)

## 2023-01-16 MED ORDER — VITAMIN B-1 100 MG PO TABS: 100.0000 mg | ORAL_TABLET | Freq: Every day | ORAL | Status: DC

## 2023-01-16 MED ORDER — FUROSEMIDE 10 MG/ML IJ SOLN
40.0000 mg | Freq: Once | INTRAMUSCULAR | Status: AC
  Administered 2023-01-16: 40 mg via INTRAVENOUS
  Filled 2023-01-16: qty 4

## 2023-01-16 MED ORDER — FUROSEMIDE 10 MG/ML IJ SOLN: 40.0000 mg | Freq: Once | INTRAMUSCULAR | Status: DC

## 2023-01-16 MED ORDER — AMIODARONE HCL 400 MG PO TABS: 400.0000 mg | ORAL_TABLET | Freq: Two times a day (BID) | ORAL | Status: DC

## 2023-01-16 MED ORDER — POTASSIUM CHLORIDE CRYS ER 20 MEQ PO TBCR
40.0000 meq | EXTENDED_RELEASE_TABLET | Freq: Once | ORAL | Status: AC
  Administered 2023-01-16: 40 meq via ORAL
  Filled 2023-01-16: qty 2

## 2023-01-16 MED ORDER — PREDNISONE 50 MG PO TABS: 50.0000 mg | ORAL_TABLET | Freq: Every day | ORAL | Status: DC

## 2023-01-16 MED ORDER — POTASSIUM CHLORIDE CRYS ER 20 MEQ PO TBCR: 20.0000 meq | EXTENDED_RELEASE_TABLET | Freq: Every day | ORAL | Status: DC

## 2023-01-16 NOTE — Progress Notes (Addendum)
 Patient discharged to [facility], transported by EMS. Discharge paperwork placed in discharge packet to give to facility. Belongings sent with patient to facility. Attempted to call patients sister [place] to inform of patients transfer, no answer.

## 2023-01-16 NOTE — Plan of Care (Signed)
  Problem: Activity: Goal: Risk for activity intolerance will decrease Outcome: Progressing   Problem: Nutrition: Goal: Adequate nutrition will be maintained Outcome: Progressing   Problem: Pain Management: Goal: General experience of comfort will improve Outcome: Progressing   Problem: Skin Integrity: Goal: Risk for impaired skin integrity will decrease Outcome: Progressing

## 2023-01-16 NOTE — TOC Progression Note (Signed)
 Transition of Care [facility]) - Progression Note    Patient Details  Name: Justin Fleming MRN: [id number] Date of Birth: 1960-09-21  Transition of Care [facility]) CM/SW Contact  Lorraine LILLETTE Fenton, LCSW Phone Number: 01/16/2023, 10:24 AM  Clinical Narrative:     Pt clear for DC, CSW contacted [facility] rehab. Pt. Clear to admit today. CSW called [facility]- pt is on transport list and will be transported today - there are 6 pt's pending due to weather. CSW communicated with [facility]- she pulled DC Summary- just needs SNF transport report.  CSW sent SNF transport summary in Epic.   CSW reached out to pt sister- contact and advised pt to DC today to [facility].  No further TOC needs.  Expected Discharge Plan: Skilled Nursing Facility Barriers to Discharge: Transportation  Expected Discharge Plan and Services       Living arrangements for the past 2 months: Single Family Home Expected Discharge Date: 01/15/23                                     Social Determinants of Health (SDOH) Interventions SDOH Screenings   Food Insecurity: No Food Insecurity (12/30/2022)  Housing: Low Risk  (12/30/2022)  Transportation Needs: No Transportation Needs (12/30/2022)  Utilities: Not At Risk (12/30/2022)  Alcohol Screen: Low Risk  (02/02/2018)  Depression (PHQ2-9): Low Risk  (02/02/2018)  Tobacco Use: Medium Risk (01/08/2023)    Readmission Risk Interventions    01/11/2023   12:44 PM  Readmission Risk Prevention Plan  Transportation Screening Complete  PCP or Specialist Appt within 5-7 Days Not Complete  Home Care Screening Complete  Medication Review (RN CM) Complete

## 2023-01-16 NOTE — Discharge Summary (Signed)
 Physician Discharge Summary   Patient: Justin Fleming MRN: [id number] DOB: 07-03-1960  Admit date:     12/29/2022  Discharge date: 01/16/23  Discharge Physician: Alm Asher Torpey   PCP: Patient, No Pcp Per   Recommendations at discharge:   Please follow up with primary care provider within 1-2 weeks  Please repeat BMP and CBC in one week Maintain 2L [place] and wean for saturation >92%    Hospital Course: History limited due to patient's intoxication/AMS, obtained by patient, chart review, EDP Wise Fees [place] is a 63 y.o. male with hx of alcohol abuse, hypertension, mood disorder, smoking, GERD, who was brought in after bystander contacted EMS because he was sitting in his car in the parking lot.   Found have alcohol intoxication, A-fib with RVR, suspected aspiration pneumonia, and worsening lactic acidosis with fluids.  Clinically suspect decompensated heart failure.  Denies any known history of A-fib.   EKG:  A-fib with RVR rate 159, LAD, LAFB, inferior Q's, poor R wave progression, T wave inversion laterally, no overt ischemic changes.    ED Course:  Found to be in A-fib with RVR, initially treated with metoprolol  2.5 mg IV x 1 with slight worsening of pressures to systolic in the 90s.  Started on heparin  for anticoagulation.  Treated with ceftriaxone /azithromycin , 2 L IV fluid, potassium 40 mg IV and mag 4 g.  At my request consulted with cardiology fellow re: rate control options.  Cardiology ultimately placed the patient on IV amiodarone .  Because of his soft blood pressures, his heart rates have been difficult to control.  The patient received intermittent boluses of amiodarone .  GI was consulted also secondary to heme positive stool and dropping hemoglobin.  EGD was performed and showed gastritis.  The patient refused colonoscopy during hospitalization.       Assessment and Plan: Acute HFpEF -12/29/2022 echo EF 55-60%, no WMA, trivial MR -Continue furosemide  -Appreciate  cardiology --15.8 L since admission but I/Os are incomplete -Serum creatinine stable   New onset atrial fibrillation with RVR, type unspecified -Initially on diltiazem  drip -Difficult to manage rate secondary to soft BPs -Apixaban  has been started as hemoglobin remained stable levels--started 01/09/2023  -Continue midodrine  -Transition to oral amiodarone  per cardiology --Not a candidate for cardioversion as to be difficult to commit to long-term anticoagulation post cardioversion in the setting of GI bleed without endoluminal evaluation  -amiodarone  400 bid x 5 more days, then 200 mg bid x 14 days, then 200 mg daily    Fever/Gouty Arthritis flare -UA negative for pyuria -Personally reviewed chest x-ray--no consolidations -Blood culture x 2 sets--neg -COVID-19 PCR negative -check RVP--neg -PCT<0.10 -uric acid--9.5 -xray right knee--mod joint effusion, +OA; 3 mm apparent metallic density overlies the bone of the proximal to mid tibial diaphysis -started prednisone >>plan 2 more days after dc -right knee pain much improved with prednisone    Acute respiratory failure with hypoxia -Secondary to CHF -Stable on 2 L -wean oxygen to RA for saturation >92%   Alcohol intoxication -No sign of withdrawal presently -Initially on CIWA -Continue thiamine  and multivitamin -Reports drinking up to 1.75 L of liquor a day, no known history of severe withdrawal in the past /seizure/DT.   Acute anemia with FOBT positive -01/08/2023 EGD--gastritis, normal duodenum -Patient refused colonoscopy -Hemoglobin--overall stable--8.2 on day of d/c -Monitor hemoglobin on apixaban  -No overt signs of bleeding at this time   Capacity evaluation----per Dr. Rendall Sinner: Earlier on tn this admission Patient was found incompetent due to intermittent confusion and forgetfulness  He Discussed it with sisters ---Justin Fleming and Justin Fleming as well as Justin Fleming and Justin Fleming--  at bedside--- they have offered to  make medical decisions for patient   - TSH 2.7  - Folate 3.6, B12 -158, - HIV, RPR>> nonreactive -On 01/12/2023, the patient was deemed to have capacity to make decisions -1/10--I evaluated pt and pt has capacity to make decision  Low B12 /folic acid  -Repleting   Hypokalemia -replete -add KCl 20 mEq daily -BMP one week after d/c        Consultants: cardiology Procedures performed: none  Disposition: Skilled nursing facility Diet recommendation: cardiac Discharge Diet Orders (From admission, onward)     Start     Ordered   01/11/23 0000  Diet - low sodium heart healthy        01/11/23 0818           Cardiac diet DISCHARGE MEDICATION: Allergies as of 01/16/2023       Reactions   Chlorhexidine  Gluconate Rash        Medication List     TAKE these medications    acetaminophen  500 MG tablet Commonly known as: TYLENOL  Take 500 mg by mouth every 6 (six) hours as needed for moderate pain (pain score 4-6).   amiodarone  400 MG tablet Commonly known as: PACERONE  Take 1 tablet (400 mg total) by mouth 2 (two) times daily. X 5 days, then 200 mg bid x 14 days, then 200 mg daily   apixaban  5 MG Tabs tablet Commonly known as: ELIQUIS  Take 1 tablet (5 mg total) by mouth 2 (two) times daily.   cyanocobalamin  500 MCG tablet Commonly known as: VITAMIN B12 Take 1 tablet (500 mcg total) by mouth daily.   ferrous sulfate  325 (65 FE) MG tablet Take 1 tablet (325 mg total) by mouth 2 (two) times daily with a meal.   folic acid  1 MG tablet Commonly known as: FOLVITE  Take 1 tablet (1 mg total) by mouth daily.   furosemide  40 MG tablet Commonly known as: LASIX  Take 1 tablet (40 mg total) by mouth daily.   midodrine  5 MG tablet Commonly known as: PROAMATINE  Take 3 tablets (15 mg total) by mouth 3 (three) times daily with meals.   multivitamin with minerals Tabs tablet Take 1 tablet by mouth daily.   pantoprazole  40 MG tablet Commonly known as: PROTONIX  Take 1  tablet (40 mg total) by mouth 2 (two) times daily.   potassium chloride  SA 20 MEQ tablet Commonly known as: KLOR-CON  M Take 1 tablet (20 mEq total) by mouth daily.   predniSONE  50 MG tablet Commonly known as: DELTASONE  Take 1 tablet (50 mg total) by mouth daily with breakfast. X 2 days   thiamine  100 MG tablet Commonly known as: Vitamin B-1 Take 1 tablet (100 mg total) by mouth daily. Start taking on: January 17, 2023        Contact information for after-discharge care     Destination     [facility] .   Service: Skilled Nursing Contact information: [address] [facility] [place]  [address] [phone]                    Discharge Exam: Filed Weights   01/13/23 0417 01/15/23 0500 01/16/23 0300  Weight: 92.5 kg 89.7 kg 94.8 kg   GENERAL:  A&O x 3, NAD, well developed, cooperative, follows commands HEENT: [place]/AT, No thrush, No icterus, No oral ulcers Neck:  No neck mass, No  meningismus, soft, supple CV: RRR, no S3, no S4, no rub, no JVD Lungs:  bibasilar rales.  No wheeze Abd: soft/NT +BS, nondistended Ext: 1 + LE edema, no lymphangitis, no cyanosis, no rashes Neuro:  CN II-XII intact, strength 4/5 in RUE, RLE, strength 4/5 LUE, LLE; sensation intact bilateral; no dysmetria; babinski equivocal   Condition at discharge: stable  The results of significant diagnostics from this hospitalization (including imaging, microbiology, ancillary and laboratory) are listed below for reference.   Imaging Studies: DG Knee 1-2 Views Right Result Date: 01/13/2023 CLINICAL DATA:  Right knee pain and swelling. Limited range of motion. No known injury. EXAM: RIGHT KNEE - 1-2 VIEW COMPARISON:  None Available. FINDINGS: Mildly decreased bone mineralization. Borderline mild patella alta with Insall-Salvati ratio measuring 1.3. Mild patellofemoral joint space narrowing. Moderate joint effusion. Minimal chronic enthesopathic change at the quadriceps  insertion on the patella. The medial and lateral compartment joint spaces are maintained. A 3 mm apparent metallic density overlies the bone of the proximal to mid tibial diaphysis on lateral view, not included on frontal view. No acute fracture dislocation. IMPRESSION: 1. Mild patellofemoral osteoarthritis. 2. Moderate joint effusion. 3. Borderline mild patella alta. 4. A 3 mm apparent metallic density overlies the bone of the proximal to mid tibial diaphysis on lateral view, not included on frontal view. Recommend clinical correlation for prior trauma or procedure in this region of the bone/calf. Electronically Signed   By: Tanda Lyons M.D.   On: 01/13/2023 17:56   DG CHEST PORT 1 VIEW Result Date: 01/11/2023 CLINICAL DATA:  Fevers EXAM: PORTABLE CHEST 1 VIEW COMPARISON:  01/08/2023 FINDINGS: Cardiac shadow is within normal limits. Lungs are well aerated bilaterally. Elevation of the left hemidiaphragm is seen. Right PICC is seen with the catheter tip in the superior right atrium. This is stable from the prior study. No other focal abnormality is seen. IMPRESSION: No change from the previous day. Electronically Signed   By: Oneil Devonshire M.D.   On: 01/11/2023 20:47   DG CHEST PORT 1 VIEW Result Date: 01/08/2023 CLINICAL DATA:  Dyspnea, respiratory abnormalities. EXAM: PORTABLE CHEST 1 VIEW COMPARISON:  X-ray 01/05/2023 and older. FINDINGS: Underinflation. Slight elevation of the left hemidiaphragm. Stable enlarged cardiopericardial silhouette with a tortuous and ectatic aorta. Tiny pleural effusions. Bandlike changes at the bases. No pneumothorax. Overlapping cardiac leads. Right-sided PICC with the tip along the SVC right atrial junction. Overall opacities are slightly improving from previous. IMPRESSION: Slight interval reduction of the lung base opacities and effusions with some residual. Recommend continued follow up. Decreasing vascular congestion. Electronically Signed   By: Ranell Bring M.D.   On:  01/08/2023 11:27   DG CHEST PORT 1 VIEW Result Date: 01/05/2023 CLINICAL DATA:  Dyspnea. EXAM: PORTABLE CHEST 1 VIEW COMPARISON:  January 03, 2023. FINDINGS: Stable cardiomegaly. Mild central pulmonary vascular congestion is noted. Hypoinflation of the lungs is noted with bibasilar atelectasis or edema with associated effusions. Bony thorax is unremarkable. Right-sided PICC line is unchanged. IMPRESSION: Stable cardiomegaly with mild central pulmonary vascular congestion. Hypoinflation of the lungs is noted with bibasilar atelectasis or edema with associated effusions. Electronically Signed   By: Lynwood Landy Raddle M.D.   On: 01/05/2023 10:50   US  Abdomen Limited RUQ (LIVER/GB) Result Date: 01/04/2023 CLINICAL DATA:  Liver disease. EXAM: ULTRASOUND ABDOMEN LIMITED RIGHT UPPER QUADRANT COMPARISON:  Ultrasound 2016 FINDINGS: Gallbladder: Gallbladder is underdistended. Sludge and stones suggested. The wall thickening of the gallbladder could relate to the patient's history of  chronic liver disease in the contracted state. Evaluation for Murphy's sign is limited as the patient could not responding to commands. Common bile duct: Diameter: 4 mm Liver: Echogenic hepatic parenchyma consistent with fatty liver infiltration. Please correlate for known history of chronic liver disease. If there is concern further of underlying mass lesion additional workup with a contrast study is recommended. Portal vein is patent on color Doppler imaging with normal direction of blood flow towards the liver. Other: Mild ascites. IMPRESSION: Echogenic liver.  Mild ascites. Contracted gallbladder with stones and sludge. No ductal dilatation. Electronically Signed   By: Ranell Bring M.D.   On: 01/04/2023 15:38   DG CHEST PORT 1 VIEW Result Date: 01/03/2023 CLINICAL DATA:  [id number] Dyspnea and respiratory abnormalities [id number] EXAM: PORTABLE CHEST 1 VIEW COMPARISON:  12/30/2022. FINDINGS: Low lung volume. Mild-to-moderate diffuse  pulmonary vascular congestion without significant interval change. There are bilateral small layering pleural effusions with probable associated compressive atelectatic changes. No pneumothorax. Stable cardio-mediastinal silhouette. No acute osseous abnormalities. The soft tissues are within normal limits. Right-sided PICC line is seen with its tip overlying the cavoatrial junction region. IMPRESSION: *Findings favor congestive heart failure/pulmonary edema, without significant interval change. Electronically Signed   By: Ree Molt M.D.   On: 01/03/2023 08:54   CT Angio Chest Pulmonary Embolism (PE) W or WO Contrast Result Date: 12/31/2022 CLINICAL DATA:  Elevated D-dimer and peripheral swelling EXAM: CT ANGIOGRAPHY CHEST WITH CONTRAST TECHNIQUE: Multidetector CT imaging of the chest was performed using the standard protocol during bolus administration of intravenous contrast. Multiplanar CT image reconstructions and MIPs were obtained to evaluate the vascular anatomy. RADIATION DOSE REDUCTION: This exam was performed according to the departmental dose-optimization program which includes automated exposure control, adjustment of the mA and/or kV according to patient size and/or use of iterative reconstruction technique. CONTRAST:  OMNIPAQUE  IOHEXOL  350 MG/ML SOLN COMPARISON:  Chest x-ray from the previous day. FINDINGS: Cardiovascular: Thoracic aorta shows a normal branching pattern. No aneurysmal dilatation or dissection is noted. Heart is at the upper limits of normal in size. The pulmonary artery shows a normal branching pattern bilaterally. No filling defect to suggest pulmonary embolism is noted. Coronary calcifications are seen. Right PICC is noted. Mediastinum/Nodes: Thoracic inlet is within normal limits. No hilar or mediastinal adenopathy is noted. The esophagus as visualized is within normal limits. Lungs/Pleura: Small left-sided pleural effusion is noted. Tiny right-sided pleural effusion  is noted with the predominant lateral component. Bilateral lower lobe atelectasis is seen. No focal confluent infiltrate is noted. No parenchymal nodules are noted. Upper Abdomen: No acute abnormality. Musculoskeletal: Degenerative changes of the thoracic spine are noted. Review of the MIP images confirms the above findings. IMPRESSION: Small pleural effusions, left greater than right with lower lobe atelectasis. No evidence of pulmonary emboli. Electronically Signed   By: Oneil Devonshire M.D.   On: 12/31/2022 18:53   US  Venous Img Lower Bilateral (DVT) Result Date: 12/31/2022 CLINICAL DATA:  Lower extremity swelling EXAM: BILATERAL LOWER EXTREMITY VENOUS DOPPLER ULTRASOUND TECHNIQUE: Gray-scale sonography with graded compression, as well as color Doppler and duplex ultrasound were performed to evaluate the lower extremity deep venous systems from the level of the common femoral vein and including the common femoral, femoral, profunda femoral, popliteal and calf veins including the posterior tibial, peroneal and gastrocnemius veins when visible. The superficial great saphenous vein was also interrogated. Spectral Doppler was utilized to evaluate flow at rest and with distal augmentation maneuvers in the common femoral, femoral and  popliteal veins. COMPARISON:  None Available. FINDINGS: RIGHT LOWER EXTREMITY Common Femoral Vein: No evidence of thrombus. Normal compressibility, respiratory phasicity and response to augmentation. Saphenofemoral Junction: No evidence of thrombus. Normal compressibility and flow on color Doppler imaging. Profunda Femoral Vein: No evidence of thrombus. Normal compressibility and flow on color Doppler imaging. Femoral Vein: No evidence of thrombus. Normal compressibility, respiratory phasicity and response to augmentation. Popliteal Vein: No evidence of thrombus. Normal compressibility, respiratory phasicity and response to augmentation. Calf Veins: No evidence of thrombus. Normal  compressibility and flow on color Doppler imaging. Superficial Great Saphenous Vein: No evidence of thrombus. Normal compressibility and flow on color Doppler imaging. Venous Reflux:  None. Other Findings:  Lower extremity edema is noted. LEFT LOWER EXTREMITY Common Femoral Vein: No evidence of thrombus. Normal compressibility, respiratory phasicity and response to augmentation. Saphenofemoral Junction: No evidence of thrombus. Normal compressibility and flow on color Doppler imaging. Profunda Femoral Vein: No evidence of thrombus. Normal compressibility and flow on color Doppler imaging. Femoral Vein: No evidence of thrombus. Normal compressibility, respiratory phasicity and response to augmentation. Popliteal Vein: No evidence of thrombus. Normal compressibility, respiratory phasicity and response to augmentation. Calf Veins: No evidence of thrombus. Normal compressibility and flow on color Doppler imaging. Superficial Great Saphenous Vein: No evidence of thrombus. Normal compressibility and flow on color Doppler imaging. Venous Reflux:  None. Other Findings:  Lower extremity edema is noted. IMPRESSION: No evidence of deep venous thrombosis. Electronically Signed   By: Oneil Devonshire M.D.   On: 12/31/2022 18:49   ECHOCARDIOGRAM COMPLETE Result Date: 12/31/2022    ECHOCARDIOGRAM REPORT   Patient Name:   Justin Fleming Date of Exam: 12/30/2022 Medical Rec #:  [id number]        Height:       70.0 in Accession #:    [id number]       Weight:       218.0 lb Date of Birth:  08/10/1960        BSA:          2.165 m Patient Age:    62 years         BP:           119/81 mmHg Patient Gender: M                HR:           94 bpm. Exam Location:  [facility] Procedure: 2D Echo, Cardiac Doppler, Color Doppler and Intracardiac            Opacification Agent Indications:    Congestive Heart Failure I50.9  History:        Patient has no prior history of Echocardiogram examinations.                 Risk Factors:Hypertension and  Current Smoker.  Sonographer:    Aida Pizza RCS Referring Phys: [id number] [facility]  Sonographer Comments: Technically difficult study due to poor echo windows. IMPRESSIONS  1. Left ventricular ejection fraction, by estimation, is 55 to 60%. The left ventricle has normal function. The left ventricle has no regional wall motion abnormalities. There is mild concentric left ventricular hypertrophy. Left ventricular diastolic parameters are indeterminate.  2. Right ventricular systolic function was not well visualized. The right ventricular size is not well visualized. Tricuspid regurgitation signal is inadequate for assessing PA pressure.  3. Left atrial size was severely dilated.  4. Right atrial size was moderately dilated.  5. The mitral valve  is grossly normal. Trivial mitral valve regurgitation.  6. The aortic valve was not well visualized. Aortic valve regurgitation is not visualized.  7. The inferior vena cava is dilated in size with <50% respiratory variability, suggesting right atrial pressure of 15 mmHg. Comparison(s): No prior Echocardiogram. FINDINGS  Left Ventricle: Left ventricular ejection fraction, by estimation, is 55 to 60%. The left ventricle has normal function. The left ventricle has no regional wall motion abnormalities. Definity  contrast agent was given IV to delineate the left ventricular  endocardial borders. The left ventricular internal cavity size was normal in size. There is mild concentric left ventricular hypertrophy. Left ventricular diastolic function could not be evaluated due to atrial fibrillation. Left ventricular diastolic parameters are indeterminate. Right Ventricle: The right ventricular size is not well visualized. Right vetricular wall thickness was not well visualized. Right ventricular systolic function was not well visualized. Tricuspid regurgitation signal is inadequate for assessing PA pressure. Left Atrium: Left atrial size was severely dilated. Right Atrium:  Right atrial size was moderately dilated. Pericardium: There is no evidence of pericardial effusion. Mitral Valve: The mitral valve is grossly normal. Trivial mitral valve regurgitation. Tricuspid Valve: The tricuspid valve is grossly normal. Tricuspid valve regurgitation is trivial. Aortic Valve: The aortic valve was not well visualized. Aortic valve regurgitation is not visualized. Pulmonic Valve: The pulmonic valve was not well visualized. Pulmonic valve regurgitation is not visualized. Aorta: The aortic root is normal in size and structure. Venous: The inferior vena cava is dilated in size with less than 50% respiratory variability, suggesting right atrial pressure of 15 mmHg. IAS/Shunts: No atrial level shunt detected by color flow Doppler.  LEFT VENTRICLE PLAX 2D LVIDd:         3.40 cm LVIDs:         2.40 cm LV PW:         1.10 cm LV IVS:        1.10 cm LVOT diam:     1.80 cm LV SV:         44 LV SV Index:   20 LVOT Area:     2.54 cm  RIGHT VENTRICLE TAPSE (M-mode): 1.2 cm LEFT ATRIUM              Index        RIGHT ATRIUM           Index LA diam:        2.80 cm  1.29 cm/m   RA Area:     26.20 cm LA Vol (A2C):   115.0 ml 53.11 ml/m  RA Volume:   90.00 ml  41.56 ml/m LA Vol (A4C):   112.0 ml 51.72 ml/m LA Biplane Vol: 119.0 ml 54.96 ml/m  AORTIC VALVE LVOT Vmax:   97.50 cm/s LVOT Vmean:  72.800 cm/s LVOT VTI:    0.173 m  AORTA Ao Root diam: 3.10 cm MITRAL VALVE MV Area (PHT): 4.21 cm     SHUNTS MV Decel Time: 180 msec     Systemic VTI:  0.17 m MV E velocity: 127.00 cm/s  Systemic Diam: 1.80 cm Jayson Sierras MD Electronically signed by Jayson Sierras MD Signature Date/Time: 12/31/2022/1:36:31 PM    Final    DG CHEST PORT 1 VIEW Result Date: 12/30/2022 CLINICAL DATA:  PICC line placement. EXAM: PORTABLE CHEST 1 VIEW COMPARISON:  One-view chest x-ray 12/29/2022 FINDINGS: Heart is enlarged. Right pleural effusion remains. Mild pulmonary vascular congestion is present bilaterally. A new right-sided  PICC line is in place.  The tip is at the cavoatrial junction. No pneumothorax is present. The visualized soft tissues and bony thorax are otherwise unremarkable. IMPRESSION: 1. New right-sided PICC line with tip at the cavoatrial junction. 2. Cardiomegaly and mild pulmonary vascular congestion. 3. Persistent right pleural effusion. Electronically Signed   By: Lonni Necessary M.D.   On: 12/30/2022 16:25   US  EKG SITE RITE Result Date: 12/30/2022 If Site Rite image not attached, placement could not be confirmed due to current cardiac rhythm.  DG Chest Port 1 View Result Date: 12/29/2022 CLINICAL DATA:  Altered mental status. EXAM: PORTABLE CHEST 1 VIEW COMPARISON:  11/05/2014 FINDINGS: Extremely shallow inspiration. Heart size and pulmonary vascularity are probably normal for technique. Suggestion of a right pleural effusion with basilar atelectasis or infiltration. No visible pneumothorax. IMPRESSION: Shallow inspiration. Right pleural effusion with basilar atelectasis or infiltration. Electronically Signed   By: Elsie Gravely M.D.   On: 12/29/2022 23:33   CT Head Wo Contrast Result Date: 12/29/2022 CLINICAL DATA:  Mental status change, unknown cause EXAM: CT HEAD WITHOUT CONTRAST TECHNIQUE: Contiguous axial images were obtained from the base of the skull through the vertex without intravenous contrast. RADIATION DOSE REDUCTION: This exam was performed according to the departmental dose-optimization program which includes automated exposure control, adjustment of the mA and/or kV according to patient size and/or use of iterative reconstruction technique. COMPARISON:  None Available. FINDINGS: Brain: Mild diffuse cerebral atrophy. No acute intracranial abnormality. Specifically, no hemorrhage, hydrocephalus, mass lesion, acute infarction, or significant intracranial injury. Vascular: No hyperdense vessel or unexpected calcification. Skull: No acute calvarial abnormality. Sinuses/Orbits: No acute  findings Other: None IMPRESSION: No acute intracranial abnormality. Mild atrophy. Electronically Signed   By: Franky Crease M.D.   On: 12/29/2022 23:28    Microbiology: Results for orders placed or performed during the hospital encounter of 12/29/22  Culture, blood (routine x 2)     Status: None   Collection Time: 12/30/22 12:51 AM   Specimen: BLOOD  Result Value Ref Range Status   Specimen Description BLOOD BLOOD RIGHT ARM  Final   Special Requests   Final    BOTTLES DRAWN AEROBIC ONLY Blood Culture results may not be optimal due to an inadequate volume of blood received in culture bottles   Culture   Final    NO GROWTH 5 DAYS Performed at [facility], [address], [place], [place] [address]    Report Status 01/04/2023 FINAL  Final  Culture, blood (routine x 2)     Status: None   Collection Time: 12/30/22 12:55 AM   Specimen: BLOOD LEFT FOREARM  Result Value Ref Range Status   Specimen Description BLOOD LEFT FOREARM  Final   Special Requests   Final    BOTTLES DRAWN AEROBIC AND ANAEROBIC Blood Culture results may not be optimal due to an inadequate volume of blood received in culture bottles   Culture   Final    NO GROWTH 5 DAYS Performed at [facility], [address] [place], [place] [address]    Report Status 01/04/2023 FINAL  Final  Respiratory (~20 pathogens) panel by PCR     Status: None   Collection Time: 12/30/22  3:59 AM   Specimen: Nasopharyngeal Swab; Respiratory  Result Value Ref Range Status   Adenovirus NOT DETECTED NOT DETECTED Final   Coronavirus 229E NOT DETECTED NOT DETECTED Final    Comment: (NOTE) The Coronavirus on the Respiratory Panel, DOES NOT test for the novel  Coronavirus (2019 nCoV)  Coronavirus HKU1 NOT DETECTED NOT DETECTED Final   Coronavirus NL63 NOT DETECTED NOT DETECTED Final   Coronavirus OC43 NOT DETECTED NOT DETECTED Final   Metapneumovirus NOT DETECTED NOT DETECTED Final   Rhinovirus / Enterovirus NOT DETECTED NOT DETECTED Final    Influenza A NOT DETECTED NOT DETECTED Final   Influenza B NOT DETECTED NOT DETECTED Final   Parainfluenza Virus 1 NOT DETECTED NOT DETECTED Final   Parainfluenza Virus 2 NOT DETECTED NOT DETECTED Final   Parainfluenza Virus 3 NOT DETECTED NOT DETECTED Final   Parainfluenza Virus 4 NOT DETECTED NOT DETECTED Final   Respiratory Syncytial Virus NOT DETECTED NOT DETECTED Final   Bordetella pertussis NOT DETECTED NOT DETECTED Final   Bordetella Parapertussis NOT DETECTED NOT DETECTED Final   Chlamydophila pneumoniae NOT DETECTED NOT DETECTED Final   Mycoplasma pneumoniae NOT DETECTED NOT DETECTED Final    Comment: Performed at [facility], [address], [place], [place] [address]  MRSA Next Gen by PCR, Nasal     Status: None   Collection Time: 12/30/22  9:06 AM   Specimen: Nasal Mucosa; Nasal Swab  Result Value Ref Range Status   MRSA by PCR Next Gen NOT DETECTED NOT DETECTED Final    Comment: (NOTE) The GeneXpert MRSA Assay (FDA approved for NASAL specimens only), is one component of a comprehensive MRSA colonization surveillance program. It is not intended to diagnose MRSA infection nor to guide or monitor treatment for MRSA infections. Test performance is not FDA approved in patients less than 27 years old. Performed at [facility], [address] [place], [place] [address]   SARS Coronavirus 2 by RT PCR (hospital order, performed in [facility] hospital lab) *cepheid single result test* Anterior Nasal Swab     Status: None   Collection Time: 12/31/22 12:31 PM   Specimen: Anterior Nasal Swab  Result Value Ref Range Status   SARS Coronavirus 2 by RT PCR NEGATIVE NEGATIVE Final    Comment: (NOTE) SARS-CoV-2 target nucleic acids are NOT DETECTED.  The SARS-CoV-2 RNA is generally detectable in upper and lower respiratory specimens during the acute phase of infection. The lowest concentration of SARS-CoV-2 viral copies this assay can detect is 250 copies / mL. A negative  result does not preclude SARS-CoV-2 infection and should not be used as the sole basis for treatment or other patient management decisions.  A negative result may occur with improper specimen collection / handling, submission of specimen other than nasopharyngeal swab, presence of viral mutation(s) within the areas targeted by this assay, and inadequate number of viral copies (<250 copies / mL). A negative result must be combined with clinical observations, patient history, and epidemiological information.  Fact Sheet for Patients:   roadlaptop.co.za  Fact Sheet for Healthcare Providers: http://kim-miller.com/  This test is not yet approved or  cleared by the [place]  FDA and has been authorized for detection and/or diagnosis of SARS-CoV-2 by FDA under an Emergency Use Authorization (EUA).  This EUA will remain in effect (meaning this test can be used) for the duration of the COVID-19 declaration under Section 564(b)(1) of the Act, 21 U.S.C. section 360bbb-3(b)(1), unless the authorization is terminated or revoked sooner.  Performed at [facility], [address], [place], [place] [address]   Culture, blood (Routine X 2) w Reflex to ID Panel     Status: None   Collection Time: 01/04/23  4:19 PM   Specimen: BLOOD LEFT HAND  Result Value Ref Range Status  Specimen Description BLOOD LEFT HAND  Final   Special Requests   Final    BOTTLES DRAWN AEROBIC AND ANAEROBIC Blood Culture adequate volume   Culture   Final    NO GROWTH 5 DAYS Performed at [facility], [address], [place], [place] [address]    Report Status 01/09/2023 FINAL  Final  Culture, blood (Routine X 2) w Reflex to ID Panel     Status: None   Collection Time: 01/04/23  4:19 PM   Specimen: BLOOD LEFT WRIST  Result Value Ref Range Status   Specimen Description BLOOD LEFT WRIST  Final   Special Requests   Final    BOTTLES DRAWN AEROBIC AND ANAEROBIC Blood Culture  adequate volume   Culture   Final    NO GROWTH 5 DAYS Performed at [facility], [address], [place], [place] [address]    Report Status 01/09/2023 FINAL  Final  Resp panel by RT-PCR (RSV, Flu A&B, Covid) Anterior Nasal Swab     Status: None   Collection Time: 01/11/23  8:50 PM   Specimen: Anterior Nasal Swab  Result Value Ref Range Status   SARS Coronavirus 2 by RT PCR NEGATIVE NEGATIVE Final    Comment: (NOTE) SARS-CoV-2 target nucleic acids are NOT DETECTED.  The SARS-CoV-2 RNA is generally detectable in upper respiratory specimens during the acute phase of infection. The lowest concentration of SARS-CoV-2 viral copies this assay can detect is 138 copies/mL. A negative result does not preclude SARS-Cov-2 infection and should not be used as the sole basis for treatment or other patient management decisions. A negative result may occur with  improper specimen collection/handling, submission of specimen other than nasopharyngeal swab, presence of viral mutation(s) within the areas targeted by this assay, and inadequate number of viral copies(<138 copies/mL). A negative result must be combined with clinical observations, patient history, and epidemiological information. The expected result is Negative.  Fact Sheet for Patients:  bloggercourse.com  Fact Sheet for Healthcare Providers:  seriousbroker.it  This test is no t yet approved or cleared by the [place]  FDA and  has been authorized for detection and/or diagnosis of SARS-CoV-2 by FDA under an Emergency Use Authorization (EUA). This EUA will remain  in effect (meaning this test can be used) for the duration of the COVID-19 declaration under Section 564(b)(1) of the Act, 21 U.S.C.section 360bbb-3(b)(1), unless the authorization is terminated  or revoked sooner.       Influenza A by PCR NEGATIVE NEGATIVE Final   Influenza B by PCR NEGATIVE NEGATIVE Final     Comment: (NOTE) The Xpert Xpress SARS-CoV-2/FLU/RSV plus assay is intended as an aid in the diagnosis of influenza from Nasopharyngeal swab specimens and should not be used as a sole basis for treatment. Nasal washings and aspirates are unacceptable for Xpert Xpress SARS-CoV-2/FLU/RSV testing.  Fact Sheet for Patients: bloggercourse.com  Fact Sheet for Healthcare Providers: seriousbroker.it  This test is not yet approved or cleared by the [place]  FDA and has been authorized for detection and/or diagnosis of SARS-CoV-2 by FDA under an Emergency Use Authorization (EUA). This EUA will remain in effect (meaning this test can be used) for the duration of the COVID-19 declaration under Section 564(b)(1) of the Act, 21 U.S.C. section 360bbb-3(b)(1), unless the authorization is terminated or revoked.     Resp Syncytial Virus by PCR NEGATIVE NEGATIVE Final    Comment: (NOTE) Fact Sheet for Patients: bloggercourse.com  Fact Sheet for Healthcare Providers: seriousbroker.it  This test is not  yet approved or cleared by the [place]  FDA and has been authorized for detection and/or diagnosis of SARS-CoV-2 by FDA under an Emergency Use Authorization (EUA). This EUA will remain in effect (meaning this test can be used) for the duration of the COVID-19 declaration under Section 564(b)(1) of the Act, 21 U.S.C. section 360bbb-3(b)(1), unless the authorization is terminated or revoked.  Performed at [facility], [address] [place], [place] [address]   Respiratory (~20 pathogens) panel by PCR     Status: None   Collection Time: 01/13/23  8:45 AM   Specimen: Nasopharyngeal Swab; Respiratory  Result Value Ref Range Status   Adenovirus NOT DETECTED NOT DETECTED Final   Coronavirus 229E NOT DETECTED NOT DETECTED Final    Comment: (NOTE) The Coronavirus on the Respiratory Panel, DOES  NOT test for the novel  Coronavirus (2019 nCoV)    Coronavirus HKU1 NOT DETECTED NOT DETECTED Final   Coronavirus NL63 NOT DETECTED NOT DETECTED Final   Coronavirus OC43 NOT DETECTED NOT DETECTED Final   Metapneumovirus NOT DETECTED NOT DETECTED Final   Rhinovirus / Enterovirus NOT DETECTED NOT DETECTED Final   Influenza A NOT DETECTED NOT DETECTED Final   Influenza B NOT DETECTED NOT DETECTED Final   Parainfluenza Virus 1 NOT DETECTED NOT DETECTED Final   Parainfluenza Virus 2 NOT DETECTED NOT DETECTED Final   Parainfluenza Virus 3 NOT DETECTED NOT DETECTED Final   Parainfluenza Virus 4 NOT DETECTED NOT DETECTED Final   Respiratory Syncytial Virus NOT DETECTED NOT DETECTED Final   Bordetella pertussis NOT DETECTED NOT DETECTED Final   Bordetella Parapertussis NOT DETECTED NOT DETECTED Final   Chlamydophila pneumoniae NOT DETECTED NOT DETECTED Final   Mycoplasma pneumoniae NOT DETECTED NOT DETECTED Final    Comment: Performed at [facility], [address], [place], [place] [address]  Culture, blood (Routine X 2) w Reflex to ID Panel     Status: None (Preliminary result)   Collection Time: 01/13/23  4:03 PM   Specimen: BLOOD LEFT HAND  Result Value Ref Range Status   Specimen Description BLOOD LEFT HAND  Final   Special Requests   Final    BOTTLES DRAWN AEROBIC AND ANAEROBIC Blood Culture adequate volume   Culture   Final    NO GROWTH 3 DAYS Performed at [facility], [address], [place], [place] [address]    Report Status PENDING  Incomplete  Culture, blood (Routine X 2) w Reflex to ID Panel     Status: None (Preliminary result)   Collection Time: 01/13/23  4:03 PM   Specimen: BLOOD  Result Value Ref Range Status   Specimen Description BLOOD BLOOD LEFT HAND  Final   Special Requests NONE  Final   Culture   Final    NO GROWTH 3 DAYS Performed at [facility], [address], [place], [place] [address]    Report Status PENDING  Incomplete    Labs: CBC: Recent  Labs  Lab 01/11/23 0409 01/13/23 0418 01/14/23 0440 01/15/23 0457 01/16/23 0343  WBC 9.4 10.8* 9.2 5.8 7.8  HGB 8.5* 8.3* 8.0* 8.4* 8.2*  HCT 26.3* 25.7* 26.0* 26.1* 26.3*  MCV 105.2* 104.9* 104.4* 103.2* 103.1*  PLT 363 412* 429* 433* 458*   Basic Metabolic Panel: Recent Labs  Lab 01/10/23 0755 01/11/23 0409 01/12/23 0449 01/13/23 0418 01/14/23 0440 01/15/23 0457 01/16/23 0343  NA  --    < > 131* 135 133* 130* 134*  K  --    < >  3.6 3.5 3.2* 3.7 3.4*  CL  --    < > 86* 87* 87* 88* 88*  CO2  --    < > 40* 38* 37* 34* 34*  GLUCOSE  --    < > 104* 104* 102* 184* 126*  BUN  --    < > 19 20 19 22 22   CREATININE  --    < > 1.13 1.23 1.07 1.18 0.96  CALCIUM  --    < > 8.7* 8.9 8.6* 8.7* 9.1  MG 2.0  --   --   --  1.8 2.0  --    < > = values in this interval not displayed.   Liver Function Tests: No results for input(s): AST, ALT, ALKPHOS, BILITOT, PROT, ALBUMIN  in the last 168 hours. CBG: No results for input(s): GLUCAP in the last 168 hours.  Discharge time spent: greater than 30 minutes.  Signed: Alm Schneider, MD [facility] 01/16/2023

## 2023-01-18 LAB — CULTURE, BLOOD (ROUTINE X 2)
Culture: NO GROWTH
Culture: NO GROWTH
Special Requests: ADEQUATE

## 2023-01-22 LAB — LAB REPORT - SCANNED: EGFR: 63

## 2023-01-26 ENCOUNTER — Encounter: Payer: Self-pay | Admitting: Gastroenterology

## 2023-01-26 ENCOUNTER — Telehealth: Payer: Self-pay | Admitting: *Deleted

## 2023-01-26 ENCOUNTER — Ambulatory Visit (INDEPENDENT_AMBULATORY_CARE_PROVIDER_SITE_OTHER): Payer: Medicare Other | Admitting: Gastroenterology

## 2023-01-26 VITALS — BP 135/86 | HR 116 | Temp 97.9°F | Ht 70.0 in | Wt 207.4 lb

## 2023-01-26 DIAGNOSIS — K2970 Gastritis, unspecified, without bleeding: Secondary | ICD-10-CM

## 2023-01-26 DIAGNOSIS — D508 Other iron deficiency anemias: Secondary | ICD-10-CM

## 2023-01-26 DIAGNOSIS — D518 Other vitamin B12 deficiency anemias: Secondary | ICD-10-CM

## 2023-01-26 DIAGNOSIS — E538 Deficiency of other specified B group vitamins: Secondary | ICD-10-CM | POA: Diagnosis not present

## 2023-01-26 DIAGNOSIS — D649 Anemia, unspecified: Secondary | ICD-10-CM

## 2023-01-26 NOTE — Patient Instructions (Addendum)
We will get you scheduled for a colonoscopy in the near future with Dr. Tasia Catchings.  Before getting you scheduled we will reach out to cardiology for clearance as well as asking to hold her Eliquis for 2 days prior.  I want you to continue taking your iron daily. You should also continue taking pantoprazole 40 mg twice daily. You should continue to take B12 and folate  We should recheck your hemoglobin and iron levels therefore I have ordered labs for the facility to complete.  I am going to recommend to the facility that they reach out to cardiology or provider at facility should consider increasing your fluid pill if you are sodium and blood pressure able to tolerate it to help with the swelling in your lower extremities.  Follow-up in 3 months, sooner if needed.  It was a pleasure to see you today. I want to create trusting relationships with patients. If you receive a survey regarding your visit,  I greatly appreciate you taking time to fill this out on paper or through your MyChart. I value your feedback.  Brooke Bonito, MSN, FNP-BC, AGACNP-BC [facility]
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
## 2023-01-26 NOTE — Telephone Encounter (Signed)
   Name: Justin Fleming  DOB: March 08, 1960  MRN: [id number]  Primary Cardiologist: None  Chart reviewed as part of pre-operative protocol coverage. The patient has an upcoming visit scheduled with Randall An, PA on 02/19/2023 at which time clearance can be addressed in case there are any issues that would impact surgical recommendations.  Colonoscopy is not scheduled until TBD as below. I added preop FYI to appointment note so that provider is aware to address at time of outpatient visit.  Per office protocol the cardiology provider should forward their finalized clearance decision and recommendations regarding antiplatelet therapy to the requesting party below.    This message will also be routed to pharmacy pool for input on holding Eliquis as requested below so that this information is available to the clearing provider at time of patient's appointment.   I will route this message as FYI to requesting party and remove this message from the preop box as separate preop APP input not needed at this time.   Please call with any questions.  Joylene Grapes, NP  01/26/2023, 2:55 PM

## 2023-01-26 NOTE — Telephone Encounter (Signed)
  Request for patient to stop medication prior to procedure or is needing cleareance  01/26/23  Justin Fleming 08/07/1960  What type of surgery is being performed? Colonoscopy  When is surgery scheduled? TBD  What type of clearance is required (medical or pharmacy to hold medication or both? BOTH- Medical and medication  Patient is needing to have cardiac clearance and clearance to hold Eliquis x 2 days prior to being scheduled for procedure.  Name of physician performing surgery?  Dr.Ahmed Aaron [facility] Phone: [phone] Fax: [phone]  Anethesia type (none, local, MAC, general)? MAC

## 2023-01-26 NOTE — Telephone Encounter (Signed)
Patient with diagnosis of A Fib on Eliquis for anticoagulation.    Procedure: colonoscopy Date of procedure: TBD  CHA2DS2-VASc Score = 2  This indicates a 2.2% annual risk of stroke. The patient's score is based upon: CHF History: 1 HTN History: 1 Diabetes History: 0 Stroke History: 0 Vascular Disease History: 0 Age Score: 0 Gender Score: 0   CrCl 103 ml/min Platelet count 458K  Per office protocol, patient can hold Eliquis for 2 days prior to procedure.   .  **This guidance is not considered finalized until pre-operative APP has relayed final recommendations.**

## 2023-01-27 LAB — LAB REPORT - SCANNED: EGFR: 51

## 2023-02-08 ENCOUNTER — Other Ambulatory Visit: Payer: Self-pay

## 2023-02-08 ENCOUNTER — Encounter: Payer: Self-pay | Admitting: Family Medicine

## 2023-02-08 ENCOUNTER — Inpatient Hospital Stay (HOSPITAL_COMMUNITY)
Admission: EM | Admit: 2023-02-08 | Discharge: 2023-02-22 | DRG: 286 | Disposition: A | Discharge status: Skilled Nursing Facility | Payer: Medicare Other | Source: Ambulatory Visit | Attending: Family Medicine | Admitting: Family Medicine

## 2023-02-08 ENCOUNTER — Ambulatory Visit (INDEPENDENT_AMBULATORY_CARE_PROVIDER_SITE_OTHER): Payer: Medicare Other | Admitting: Family Medicine

## 2023-02-08 ENCOUNTER — Encounter (HOSPITAL_COMMUNITY): Payer: Self-pay

## 2023-02-08 ENCOUNTER — Observation Stay (HOSPITAL_COMMUNITY): Payer: Medicare Other

## 2023-02-08 ENCOUNTER — Emergency Department (HOSPITAL_COMMUNITY): Payer: Medicare Other

## 2023-02-08 VITALS — BP 115/82 | HR 153 | Temp 100.0°F | Ht 70.0 in | Wt 198.0 lb

## 2023-02-08 DIAGNOSIS — I509 Heart failure, unspecified: Secondary | ICD-10-CM

## 2023-02-08 DIAGNOSIS — I5033 Acute on chronic diastolic (congestive) heart failure: Secondary | ICD-10-CM | POA: Diagnosis present

## 2023-02-08 DIAGNOSIS — Z79899 Other long term (current) drug therapy: Secondary | ICD-10-CM

## 2023-02-08 DIAGNOSIS — Z6828 Body mass index (BMI) 28.0-28.9, adult: Secondary | ICD-10-CM

## 2023-02-08 DIAGNOSIS — J069 Acute upper respiratory infection, unspecified: Secondary | ICD-10-CM | POA: Diagnosis present

## 2023-02-08 DIAGNOSIS — Z800 Family history of malignant neoplasm of digestive organs: Secondary | ICD-10-CM

## 2023-02-08 DIAGNOSIS — K700 Alcoholic fatty liver: Secondary | ICD-10-CM | POA: Diagnosis present

## 2023-02-08 DIAGNOSIS — Z716 Tobacco abuse counseling: Secondary | ICD-10-CM

## 2023-02-08 DIAGNOSIS — I252 Old myocardial infarction: Secondary | ICD-10-CM

## 2023-02-08 DIAGNOSIS — I429 Cardiomyopathy, unspecified: Secondary | ICD-10-CM | POA: Diagnosis present

## 2023-02-08 DIAGNOSIS — N1831 Chronic kidney disease, stage 3a: Secondary | ICD-10-CM | POA: Diagnosis present

## 2023-02-08 DIAGNOSIS — Z7901 Long term (current) use of anticoagulants: Secondary | ICD-10-CM

## 2023-02-08 DIAGNOSIS — I4819 Other persistent atrial fibrillation: Principal | ICD-10-CM | POA: Diagnosis present

## 2023-02-08 DIAGNOSIS — M7989 Other specified soft tissue disorders: Secondary | ICD-10-CM | POA: Diagnosis present

## 2023-02-08 DIAGNOSIS — R7989 Other specified abnormal findings of blood chemistry: Secondary | ICD-10-CM | POA: Diagnosis present

## 2023-02-08 DIAGNOSIS — F1010 Alcohol abuse, uncomplicated: Secondary | ICD-10-CM | POA: Diagnosis present

## 2023-02-08 DIAGNOSIS — J1000 Influenza due to other identified influenza virus with unspecified type of pneumonia: Secondary | ICD-10-CM | POA: Diagnosis present

## 2023-02-08 DIAGNOSIS — I2489 Other forms of acute ischemic heart disease: Secondary | ICD-10-CM | POA: Diagnosis present

## 2023-02-08 DIAGNOSIS — B9729 Other coronavirus as the cause of diseases classified elsewhere: Secondary | ICD-10-CM | POA: Diagnosis present

## 2023-02-08 DIAGNOSIS — N179 Acute kidney failure, unspecified: Secondary | ICD-10-CM | POA: Diagnosis present

## 2023-02-08 DIAGNOSIS — E663 Overweight: Secondary | ICD-10-CM | POA: Diagnosis present

## 2023-02-08 DIAGNOSIS — R195 Other fecal abnormalities: Secondary | ICD-10-CM | POA: Diagnosis present

## 2023-02-08 DIAGNOSIS — D649 Anemia, unspecified: Secondary | ICD-10-CM | POA: Diagnosis present

## 2023-02-08 DIAGNOSIS — E876 Hypokalemia: Secondary | ICD-10-CM | POA: Diagnosis present

## 2023-02-08 DIAGNOSIS — I5043 Acute on chronic combined systolic (congestive) and diastolic (congestive) heart failure: Secondary | ICD-10-CM | POA: Diagnosis present

## 2023-02-08 DIAGNOSIS — J9601 Acute respiratory failure with hypoxia: Secondary | ICD-10-CM | POA: Diagnosis present

## 2023-02-08 DIAGNOSIS — E871 Hypo-osmolality and hyponatremia: Secondary | ICD-10-CM | POA: Diagnosis present

## 2023-02-08 DIAGNOSIS — K2970 Gastritis, unspecified, without bleeding: Secondary | ICD-10-CM | POA: Diagnosis present

## 2023-02-08 DIAGNOSIS — F10129 Alcohol abuse with intoxication, unspecified: Secondary | ICD-10-CM | POA: Diagnosis present

## 2023-02-08 DIAGNOSIS — I4891 Unspecified atrial fibrillation: Secondary | ICD-10-CM

## 2023-02-08 DIAGNOSIS — F17200 Nicotine dependence, unspecified, uncomplicated: Secondary | ICD-10-CM | POA: Diagnosis present

## 2023-02-08 DIAGNOSIS — I081 Rheumatic disorders of both mitral and tricuspid valves: Secondary | ICD-10-CM | POA: Diagnosis present

## 2023-02-08 DIAGNOSIS — I130 Hypertensive heart and chronic kidney disease with heart failure and stage 1 through stage 4 chronic kidney disease, or unspecified chronic kidney disease: Secondary | ICD-10-CM | POA: Diagnosis present

## 2023-02-08 DIAGNOSIS — D631 Anemia in chronic kidney disease: Secondary | ICD-10-CM | POA: Diagnosis present

## 2023-02-08 DIAGNOSIS — Z1152 Encounter for screening for COVID-19: Secondary | ICD-10-CM

## 2023-02-08 DIAGNOSIS — J101 Influenza due to other identified influenza virus with other respiratory manifestations: Secondary | ICD-10-CM | POA: Diagnosis present

## 2023-02-08 DIAGNOSIS — K760 Fatty (change of) liver, not elsewhere classified: Secondary | ICD-10-CM | POA: Diagnosis present

## 2023-02-08 DIAGNOSIS — Z883 Allergy status to other anti-infective agents status: Secondary | ICD-10-CM

## 2023-02-08 DIAGNOSIS — J159 Unspecified bacterial pneumonia: Secondary | ICD-10-CM | POA: Diagnosis present

## 2023-02-08 DIAGNOSIS — E8809 Other disorders of plasma-protein metabolism, not elsewhere classified: Secondary | ICD-10-CM | POA: Diagnosis present

## 2023-02-08 DIAGNOSIS — K219 Gastro-esophageal reflux disease without esophagitis: Secondary | ICD-10-CM | POA: Diagnosis present

## 2023-02-08 LAB — RESPIRATORY PANEL BY PCR

## 2023-02-08 LAB — COMPREHENSIVE METABOLIC PANEL
ALT: 13 U/L (ref 0–44)
AST: 21 U/L (ref 15–41)
Albumin: 3.3 g/dL — ABNORMAL LOW (ref 3.5–5.0)
Alkaline Phosphatase: 60 U/L (ref 38–126)
Anion gap: 15 (ref 5–15)
BUN: 15 mg/dL (ref 8–23)
CO2: 27 mmol/L (ref 22–32)
Calcium: 8.8 mg/dL — ABNORMAL LOW (ref 8.9–10.3)
Chloride: 100 mmol/L (ref 98–111)
Creatinine, Ser: 1.49 mg/dL — ABNORMAL HIGH (ref 0.61–1.24)
GFR, Estimated: 52 mL/min — ABNORMAL LOW (ref >60)
Glucose, Bld: 102 mg/dL — ABNORMAL HIGH (ref 70–99)
Potassium: 3.7 mmol/L (ref 3.5–5.1)
Sodium: 142 mmol/L (ref 135–145)
Total Bilirubin: 1.3 mg/dL — ABNORMAL HIGH (ref 0.0–1.2)
Total Protein: 7.2 g/dL (ref 6.5–8.1)

## 2023-02-08 LAB — URINALYSIS, W/ REFLEX TO CULTURE (INFECTION SUSPECTED)
Bacteria, UA: NONE SEEN
Bilirubin Urine: NEGATIVE
Glucose, UA: NEGATIVE mg/dL
Hgb urine dipstick: NEGATIVE
Ketones, ur: NEGATIVE mg/dL
Leukocytes,Ua: NEGATIVE
Nitrite: NEGATIVE
Protein, ur: 30 mg/dL — AB
Specific Gravity, Urine: 1.021 (ref 1.005–1.030)
pH: 5 (ref 5.0–8.0)

## 2023-02-08 LAB — TROPONIN I (HIGH SENSITIVITY)
Troponin I (High Sensitivity): 49 ng/L — ABNORMAL HIGH (ref <18)
Troponin I (High Sensitivity): 53 ng/L — ABNORMAL HIGH (ref <18)
Troponin I (High Sensitivity): 68 ng/L — ABNORMAL HIGH (ref <18)

## 2023-02-08 LAB — CBC WITH DIFFERENTIAL/PLATELET
Abs Immature Granulocytes: 0.03 10*3/uL (ref 0.00–0.07)
Basophils Absolute: 0 10*3/uL (ref 0.0–0.1)
Basophils Relative: 0 %
Eosinophils Absolute: 0.1 10*3/uL (ref 0.0–0.5)
Eosinophils Relative: 1 %
HCT: 31.2 % — ABNORMAL LOW (ref 39.0–52.0)
Hemoglobin: 9.9 g/dL — ABNORMAL LOW (ref 13.0–17.0)
Immature Granulocytes: 0 %
Lymphocytes Relative: 8 %
Lymphs Abs: 0.6 10*3/uL — ABNORMAL LOW (ref 0.7–4.0)
MCH: 31.1 pg (ref 26.0–34.0)
MCHC: 31.7 g/dL (ref 30.0–36.0)
MCV: 98.1 fL (ref 80.0–100.0)
Monocytes Absolute: 0.5 10*3/uL (ref 0.1–1.0)
Monocytes Relative: 6 %
Neutro Abs: 7 10*3/uL (ref 1.7–7.7)
Neutrophils Relative %: 85 %
Platelets: 172 10*3/uL (ref 150–400)
RBC: 3.18 MIL/uL — ABNORMAL LOW (ref 4.22–5.81)
RDW: 19 % — ABNORMAL HIGH (ref 11.5–15.5)
WBC: 8.2 10*3/uL (ref 4.0–10.5)
nRBC: 0 % (ref 0.0–0.2)

## 2023-02-08 LAB — BLOOD GAS, VENOUS
Acid-Base Excess: 8.3 mmol/L — ABNORMAL HIGH (ref 0.0–2.0)
Bicarbonate: 31.8 mmol/L — ABNORMAL HIGH (ref 20.0–28.0)
Drawn by: 5206
O2 Saturation: 94.9 %
Patient temperature: 37
pCO2, Ven: 39 mm[Hg] — ABNORMAL LOW (ref 44–60)
pH, Ven: 7.52 — ABNORMAL HIGH (ref 7.25–7.43)
pO2, Ven: 62 mm[Hg] — ABNORMAL HIGH (ref 32–45)

## 2023-02-08 LAB — RAPID URINE DRUG SCREEN, HOSP PERFORMED
Amphetamines: NOT DETECTED
Barbiturates: NOT DETECTED
Benzodiazepines: POSITIVE — AB
Cocaine: NOT DETECTED
Opiates: NOT DETECTED
Tetrahydrocannabinol: NOT DETECTED

## 2023-02-08 LAB — OSMOLALITY, URINE: Osmolality, Ur: 599 mosm/kg (ref 300–900)

## 2023-02-08 LAB — I-STAT CG4 LACTIC ACID, ED
Lactic Acid, Venous: 1.4 mmol/L (ref 0.5–1.9)
Lactic Acid, Venous: 1.1 mmol/L (ref 0.5–1.9)

## 2023-02-08 LAB — PROTIME-INR
INR: 1.3 — ABNORMAL HIGH (ref 0.8–1.2)
Prothrombin Time: 16 s — ABNORMAL HIGH (ref 11.4–15.2)

## 2023-02-08 LAB — RESP PANEL BY RT-PCR (RSV, FLU A&B, COVID)  RVPGX2
Influenza A by PCR: NEGATIVE
Influenza B by PCR: NEGATIVE
Resp Syncytial Virus by PCR: NEGATIVE
SARS Coronavirus 2 by RT PCR: NEGATIVE

## 2023-02-08 LAB — AMMONIA: Ammonia: 37 umol/L — ABNORMAL HIGH (ref 9–35)

## 2023-02-08 LAB — ETHANOL: Alcohol, Ethyl (B): <10 mg/dL (ref <10)

## 2023-02-08 LAB — BRAIN NATRIURETIC PEPTIDE: B Natriuretic Peptide: 570.9 pg/mL — ABNORMAL HIGH (ref 0.0–100.0)

## 2023-02-08 LAB — SODIUM, URINE, RANDOM: Sodium, Ur: 104 mmol/L

## 2023-02-08 LAB — CREATININE, URINE, RANDOM: Creatinine, Urine: 162 mg/dL

## 2023-02-08 MED ORDER — ACETAMINOPHEN 650 MG RE SUPP: 650.0000 mg | Freq: Four times a day (QID) | RECTAL | Status: DC | PRN

## 2023-02-08 MED ORDER — AMIODARONE HCL IN DEXTROSE 360-4.14 MG/200ML-% IV SOLN
60.0000 mg/h | INTRAVENOUS | Status: AC
  Administered 2023-02-08: 60 mg/h via INTRAVENOUS
  Filled 2023-02-08: qty 200

## 2023-02-08 MED ORDER — MIDODRINE HCL 5 MG PO TABS
15.0000 mg | ORAL_TABLET | Freq: Three times a day (TID) | ORAL | Status: DC
Start: 2023-02-09 — End: 2023-02-22
  Administered 2023-02-09 – 2023-02-22 (×41): 15 mg via ORAL
  Filled 2023-02-08 (×41): qty 3

## 2023-02-08 MED ORDER — THIAMINE MONONITRATE 100 MG PO TABS
100.0000 mg | ORAL_TABLET | Freq: Every day | ORAL | Status: DC
  Administered 2023-02-09 – 2023-02-22 (×14): 100 mg via ORAL
  Filled 2023-02-08 (×14): qty 1

## 2023-02-08 MED ORDER — SODIUM CHLORIDE 0.9% FLUSH: 3.0000 mL | INTRAVENOUS | Status: DC | PRN

## 2023-02-08 MED ORDER — GUAIFENESIN ER 600 MG PO TB12
600.0000 mg | ORAL_TABLET | Freq: Two times a day (BID) | ORAL | Status: DC
  Administered 2023-02-08 – 2023-02-15 (×14): 600 mg via ORAL
  Filled 2023-02-08 (×14): qty 1

## 2023-02-08 MED ORDER — FUROSEMIDE 10 MG/ML IJ SOLN
40.0000 mg | Freq: Two times a day (BID) | INTRAMUSCULAR | Status: DC
  Administered 2023-02-08 – 2023-02-11 (×6): 40 mg via INTRAVENOUS
  Filled 2023-02-08 (×6): qty 4

## 2023-02-08 MED ORDER — APIXABAN 5 MG PO TABS
5.0000 mg | ORAL_TABLET | Freq: Two times a day (BID) | ORAL | Status: DC
Start: 2023-02-08 — End: 2023-02-22
  Administered 2023-02-08 – 2023-02-22 (×28): 5 mg via ORAL
  Filled 2023-02-08 (×28): qty 1

## 2023-02-08 MED ORDER — ONDANSETRON HCL 4 MG/2ML IJ SOLN: 4.0000 mg | Freq: Four times a day (QID) | INTRAMUSCULAR | Status: DC | PRN

## 2023-02-08 MED ORDER — SODIUM CHLORIDE 0.9 % IV SOLN
250.0000 mL | INTRAVENOUS | Status: AC | PRN
Start: 2023-02-08 — End: 2023-02-09

## 2023-02-08 MED ORDER — ACETAMINOPHEN 500 MG PO TABS
1000.0000 mg | ORAL_TABLET | Freq: Once | ORAL | Status: AC
  Administered 2023-02-08: 1000 mg via ORAL

## 2023-02-08 MED ORDER — AMIODARONE HCL IN DEXTROSE 360-4.14 MG/200ML-% IV SOLN
30.0000 mg/h | INTRAVENOUS | Status: DC
  Administered 2023-02-08: 30 mg/h via INTRAVENOUS
  Filled 2023-02-08: qty 200

## 2023-02-08 MED ORDER — ONDANSETRON HCL 4 MG PO TABS: 4.0000 mg | ORAL_TABLET | Freq: Four times a day (QID) | ORAL | Status: DC | PRN

## 2023-02-08 MED ORDER — HYDROCODONE-ACETAMINOPHEN 5-325 MG PO TABS
1.0000 | ORAL_TABLET | ORAL | Status: DC | PRN
  Administered 2023-02-10 – 2023-02-15 (×5): 2 via ORAL
  Administered 2023-02-15: 1 via ORAL
  Administered 2023-02-16 – 2023-02-17 (×3): 2 via ORAL
  Administered 2023-02-18: 1 via ORAL
  Administered 2023-02-19 – 2023-02-22 (×10): 2 via ORAL
  Filled 2023-02-08: qty 2
  Filled 2023-02-08: qty 1
  Filled 2023-02-08 (×10): qty 2
  Filled 2023-02-08: qty 1
  Filled 2023-02-08 (×7): qty 2

## 2023-02-08 MED ORDER — SODIUM CHLORIDE 0.9% FLUSH
3.0000 mL | Freq: Two times a day (BID) | INTRAVENOUS | Status: DC
  Administered 2023-02-08 – 2023-02-21 (×21): 3 mL via INTRAVENOUS

## 2023-02-08 MED ORDER — ACETAMINOPHEN 325 MG PO TABS
650.0000 mg | ORAL_TABLET | Freq: Four times a day (QID) | ORAL | Status: DC | PRN
  Administered 2023-02-09 – 2023-02-17 (×8): 650 mg via ORAL
  Filled 2023-02-08 (×9): qty 2

## 2023-02-08 MED ORDER — PANTOPRAZOLE SODIUM 40 MG PO TBEC
40.0000 mg | DELAYED_RELEASE_TABLET | Freq: Two times a day (BID) | ORAL | Status: DC
  Administered 2023-02-08 – 2023-02-22 (×28): 40 mg via ORAL
  Filled 2023-02-08 (×28): qty 1

## 2023-02-08 MED ORDER — NICOTINE 21 MG/24HR TD PT24
21.0000 mg | MEDICATED_PATCH | Freq: Every day | TRANSDERMAL | Status: DC
  Filled 2023-02-08 (×6): qty 1

## 2023-02-08 NOTE — Assessment & Plan Note (Signed)
 Obtain anemia panel  Transfuse for Hg <7 , rapidly dropping or  if symptomatic

## 2023-02-08 NOTE — Assessment & Plan Note (Signed)
Respiratory panel significant for coronavirus.  Blood cultures pending Check procalcitonin at this point no evidence of bacterial infection noted continue to monitor and readdress as needed

## 2023-02-08 NOTE — ED Provider Notes (Signed)
[place] EMERGENCY DEPARTMENT AT [facility] Provider Note   CSN: [id number] Arrival date & time: 02/08/23  1607     History {Add pertinent medical, surgical, social history, OB history to HPI:1} Chief Complaint  Patient presents with   Tachycardia    Justin Fleming is a 63 y.o. male.  HPI     Home Medications Prior to Admission medications   Medication Sig Start Date End Date Taking? Authorizing Provider  acetaminophen (TYLENOL) 500 MG tablet Take 500 mg by mouth every 6 (six) hours as needed for moderate pain (pain score 4-6).    [provider]  allopurinol (ZYLOPRIM) 100 MG tablet Take 100 mg by mouth daily.    [provider]  amiodarone (PACERONE) 400 MG tablet Take 1 tablet (400 mg total) by mouth 2 (two) times daily. X 5 days, then 200 mg bid x 14 days, then 200 mg daily Patient taking differently: Take 200 mg by mouth daily. 01/16/23   Catarina Hartshorn, MD  apixaban (ELIQUIS) 5 MG TABS tablet Take 1 tablet (5 mg total) by mouth 2 (two) times daily. 01/11/23   Shahmehdi, Gemma Payor, MD  cyanocobalamin (VITAMIN B12) 500 MCG tablet Take 1 tablet (500 mcg total) by mouth daily. 01/15/23   Catarina Hartshorn, MD  ferrous sulfate 325 (65 FE) MG tablet Take 1 tablet (325 mg total) by mouth 2 (two) times daily with a meal. 01/11/23 02/10/23  Shahmehdi, Gemma Payor, MD  folic acid (FOLVITE) 1 MG tablet Take 1 tablet (1 mg total) by mouth daily. 01/15/23   Catarina Hartshorn, MD  furosemide (LASIX) 40 MG tablet Take 1 tablet (40 mg total) by mouth daily. 01/11/23   Shahmehdi, Gemma Payor, MD  midodrine (PROAMATINE) 5 MG tablet Take 3 tablets (15 mg total) by mouth 3 (three) times daily with meals. 01/11/23 02/10/23  Kendell Bane, MD  Multiple Vitamin (MULTIVITAMIN WITH MINERALS) TABS tablet Take 1 tablet by mouth daily. 01/11/23   Shahmehdi, Gemma Payor, MD  pantoprazole (PROTONIX) 40 MG tablet Take 1 tablet (40 mg total) by mouth 2 (two) times daily. 01/15/23   Catarina Hartshorn, MD  potassium chloride SA  (KLOR-CON M) 20 MEQ tablet Take 1 tablet (20 mEq total) by mouth daily. 01/16/23   Catarina Hartshorn, MD  predniSONE (DELTASONE) 50 MG tablet Take 1 tablet (50 mg total) by mouth daily with breakfast. X 2 days Patient not taking: Reported on 02/08/2023 01/16/23   Catarina Hartshorn, MD  promethazine (PHENERGAN) 12.5 MG suppository Place 12.5 mg rectally every 6 (six) hours as needed for nausea or vomiting.    [provider]  thiamine (VITAMIN B-1) 100 MG tablet Take 1 tablet (100 mg total) by mouth daily. 01/17/23   Catarina Hartshorn, MD      Allergies    Chlorhexidine gluconate    Review of Systems   Review of Systems  Physical Exam Updated Vital Signs BP (!) 114/92 (BP Location: Left Arm)   Pulse 64   Temp 99.7 F (37.6 C) (Oral)   Resp 18   Ht 5\' 10"  (1.778 m)   Wt 89.8 kg   SpO2 96%   BMI 28.41 kg/m  Physical Exam  ED Results / Procedures / Treatments   Labs (all labs ordered are listed, but only abnormal results are displayed) Labs Reviewed  RESP PANEL BY RT-PCR (RSV, FLU A&B, COVID)  RVPGX2  CULTURE, BLOOD (ROUTINE X 2)  CULTURE, BLOOD (ROUTINE X 2)  CBC WITH DIFFERENTIAL/PLATELET  ETHANOL  COMPREHENSIVE METABOLIC PANEL  PROTIME-INR  URINALYSIS, W/ REFLEX TO CULTURE (INFECTION SUSPECTED)  BRAIN NATRIURETIC PEPTIDE  I-STAT CG4 LACTIC ACID, ED  TROPONIN I (HIGH SENSITIVITY)    EKG None  Radiology No results found.  Procedures Procedures  {Document cardiac monitor, telemetry assessment procedure when appropriate:1}  Medications Ordered in ED Medications  acetaminophen (TYLENOL) tablet 1,000 mg (has no administration in time range)    ED Course/ Medical Decision Making/ A&P   {   Click here for ABCD2, HEART and other calculatorsREFRESH Note before signing :1}                              Medical Decision Making Amount and/or Complexity of Data Reviewed Labs: ordered. Radiology: ordered.  Risk OTC drugs.   ***  {Document critical care time when  appropriate:1} {Document review of labs and clinical decision tools ie heart score, Chads2Vasc2 etc:1}  {Document your independent review of radiology images, and any outside records:1} {Document your discussion with family members, caretakers, and with consultants:1} {Document social determinants of health affecting pt's care:1} {Document your decision making why or why not admission, treatments were needed:1} Final Clinical Impression(s) / ED Diagnoses Final diagnoses:  None    Rx / DC Orders ED Discharge Orders     None

## 2023-02-08 NOTE — Assessment & Plan Note (Signed)
-   Pt diagnosed with CHF based on presence of the following:   , rales on exam,  cardiomegaly,   bilateral leg edema, DOE, tachycardia (hr>120),    With noted response to IV diuretic in ER  admit on telemetry,  cycle cardiac enzymes, Cardiac Panel (last 3 results) Recent Labs    02/08/23 1639 02/08/23 1844  TROPONINIHS 49* 53*     obtain serial ECG  to evaluate for ischemia as a cause of heart failure  monitor daily weight:  Filed Weights   02/08/23 1619  Weight: 89.8 kg   Last BNP BNP (last 3 results) Recent Labs    01/01/23 0456 01/08/23 0303 02/08/23 1639  BNP 549.0* 809.0* 570.9*       diurese with IV lasix   40 mg IV BID    and monitor orthostatics and creatinine to avoid over diuresis.  Order echogram to evaluate EF and valves    cardiology consulted

## 2023-02-08 NOTE — Assessment & Plan Note (Signed)
Transported to ED via EMS. Afib RVR rate 150s on EKG. He has not been taking his Eliquis. Unsure if he is taking Amiodarone as he is not a great historian and did not have his medications today. His sister did go ahead and schedule a 2 week follow up with me.

## 2023-02-08 NOTE — Assessment & Plan Note (Signed)
Patient in the past that history of alcohol induced fatty liver disease. His edema has progressed Also noted worsening renal function We will start by obtaining ultrasound of abdomen to evaluate kidneys as well as for presence of ascites or any pelvic abnormality which could cause decrease venous outflow

## 2023-02-08 NOTE — ED Triage Notes (Signed)
Pt BIB [facility] from PCP where he presented to establish care but was found to be in AFIB with rate 150-200. Given 10 mg IV cardizem en route.

## 2023-02-08 NOTE — Assessment & Plan Note (Addendum)
Monitor for any sign of withdrawal, patient states he no longer drinking

## 2023-02-08 NOTE — Assessment & Plan Note (Signed)
In the setting of tachycardia Continue to monitor and cycle echo in the morning

## 2023-02-08 NOTE — ED Notes (Signed)
PLEASE CALL HELEN TATUM-SISTER [phone]

## 2023-02-08 NOTE — Assessment & Plan Note (Addendum)
Appreciate cardiology involvement continue amiodarone drip Echogram in the morning Per cardiology note as well as hemoglobin very stable would resume Eliquis at home dose
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
## 2023-02-08 NOTE — Subjective & Objective (Signed)
Pt recently diagnosed with a.fib on amiodarone and eiquis unclear if compliant presented to establish care with PCP found to be tachycardic in 140s and was sent to emergency department for further evaluation.  Patient with history of alcohol abuse. At the PCP office patient was found to be tachypneic tachycardic and febrile reports URI symptoms Family states he is not taking care of himself Last admission in December 2024 for sepsis alcohol A-fib with RVR with possible aspiration pneumonia
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
## 2023-02-09 ENCOUNTER — Other Ambulatory Visit (HOSPITAL_COMMUNITY): Payer: Medicare Other

## 2023-02-09 ENCOUNTER — Observation Stay (HOSPITAL_BASED_OUTPATIENT_CLINIC_OR_DEPARTMENT_OTHER): Payer: Medicare Other

## 2023-02-09 DIAGNOSIS — I429 Cardiomyopathy, unspecified: Secondary | ICD-10-CM | POA: Diagnosis present

## 2023-02-09 DIAGNOSIS — I5021 Acute systolic (congestive) heart failure: Secondary | ICD-10-CM | POA: Diagnosis not present

## 2023-02-09 DIAGNOSIS — I4891 Unspecified atrial fibrillation: Secondary | ICD-10-CM | POA: Diagnosis present

## 2023-02-09 DIAGNOSIS — I110 Hypertensive heart disease with heart failure: Secondary | ICD-10-CM | POA: Diagnosis not present

## 2023-02-09 DIAGNOSIS — E876 Hypokalemia: Secondary | ICD-10-CM | POA: Diagnosis present

## 2023-02-09 DIAGNOSIS — I081 Rheumatic disorders of both mitral and tricuspid valves: Secondary | ICD-10-CM | POA: Diagnosis present

## 2023-02-09 DIAGNOSIS — Q2112 Patent foramen ovale: Secondary | ICD-10-CM | POA: Diagnosis not present

## 2023-02-09 DIAGNOSIS — I5033 Acute on chronic diastolic (congestive) heart failure: Secondary | ICD-10-CM | POA: Diagnosis not present

## 2023-02-09 DIAGNOSIS — N179 Acute kidney failure, unspecified: Secondary | ICD-10-CM | POA: Diagnosis present

## 2023-02-09 DIAGNOSIS — K760 Fatty (change of) liver, not elsewhere classified: Secondary | ICD-10-CM | POA: Diagnosis present

## 2023-02-09 DIAGNOSIS — J1000 Influenza due to other identified influenza virus with unspecified type of pneumonia: Secondary | ICD-10-CM | POA: Diagnosis present

## 2023-02-09 DIAGNOSIS — E871 Hypo-osmolality and hyponatremia: Secondary | ICD-10-CM | POA: Diagnosis present

## 2023-02-09 DIAGNOSIS — F17200 Nicotine dependence, unspecified, uncomplicated: Secondary | ICD-10-CM | POA: Diagnosis present

## 2023-02-09 DIAGNOSIS — I130 Hypertensive heart and chronic kidney disease with heart failure and stage 1 through stage 4 chronic kidney disease, or unspecified chronic kidney disease: Secondary | ICD-10-CM | POA: Diagnosis present

## 2023-02-09 DIAGNOSIS — J101 Influenza due to other identified influenza virus with other respiratory manifestations: Secondary | ICD-10-CM | POA: Diagnosis present

## 2023-02-09 DIAGNOSIS — F1010 Alcohol abuse, uncomplicated: Secondary | ICD-10-CM | POA: Diagnosis not present

## 2023-02-09 DIAGNOSIS — I509 Heart failure, unspecified: Secondary | ICD-10-CM | POA: Diagnosis not present

## 2023-02-09 DIAGNOSIS — D631 Anemia in chronic kidney disease: Secondary | ICD-10-CM | POA: Diagnosis present

## 2023-02-09 DIAGNOSIS — Z87891 Personal history of nicotine dependence: Secondary | ICD-10-CM | POA: Diagnosis not present

## 2023-02-09 DIAGNOSIS — I5043 Acute on chronic combined systolic (congestive) and diastolic (congestive) heart failure: Secondary | ICD-10-CM | POA: Diagnosis present

## 2023-02-09 DIAGNOSIS — N1831 Chronic kidney disease, stage 3a: Secondary | ICD-10-CM | POA: Diagnosis present

## 2023-02-09 DIAGNOSIS — I2489 Other forms of acute ischemic heart disease: Secondary | ICD-10-CM | POA: Diagnosis present

## 2023-02-09 DIAGNOSIS — J9601 Acute respiratory failure with hypoxia: Secondary | ICD-10-CM | POA: Diagnosis present

## 2023-02-09 DIAGNOSIS — J159 Unspecified bacterial pneumonia: Secondary | ICD-10-CM | POA: Diagnosis present

## 2023-02-09 DIAGNOSIS — K700 Alcoholic fatty liver: Secondary | ICD-10-CM | POA: Diagnosis not present

## 2023-02-09 DIAGNOSIS — Z79899 Other long term (current) drug therapy: Secondary | ICD-10-CM | POA: Diagnosis not present

## 2023-02-09 DIAGNOSIS — F10129 Alcohol abuse with intoxication, unspecified: Secondary | ICD-10-CM | POA: Diagnosis present

## 2023-02-09 DIAGNOSIS — E8809 Other disorders of plasma-protein metabolism, not elsewhere classified: Secondary | ICD-10-CM | POA: Diagnosis present

## 2023-02-09 DIAGNOSIS — Z7901 Long term (current) use of anticoagulants: Secondary | ICD-10-CM | POA: Diagnosis not present

## 2023-02-09 DIAGNOSIS — J069 Acute upper respiratory infection, unspecified: Secondary | ICD-10-CM | POA: Diagnosis not present

## 2023-02-09 DIAGNOSIS — E663 Overweight: Secondary | ICD-10-CM | POA: Diagnosis present

## 2023-02-09 DIAGNOSIS — I5020 Unspecified systolic (congestive) heart failure: Secondary | ICD-10-CM | POA: Diagnosis not present

## 2023-02-09 DIAGNOSIS — Z1152 Encounter for screening for COVID-19: Secondary | ICD-10-CM | POA: Diagnosis not present

## 2023-02-09 DIAGNOSIS — R7989 Other specified abnormal findings of blood chemistry: Secondary | ICD-10-CM | POA: Diagnosis not present

## 2023-02-09 DIAGNOSIS — I361 Nonrheumatic tricuspid (valve) insufficiency: Secondary | ICD-10-CM | POA: Diagnosis not present

## 2023-02-09 DIAGNOSIS — I340 Nonrheumatic mitral (valve) insufficiency: Secondary | ICD-10-CM | POA: Diagnosis not present

## 2023-02-09 DIAGNOSIS — I4819 Other persistent atrial fibrillation: Secondary | ICD-10-CM | POA: Diagnosis present

## 2023-02-09 LAB — ECHOCARDIOGRAM COMPLETE
AR max vel: 1.89 cm2
AV Area VTI: 2.04 cm2
AV Area mean vel: 1.8 cm2
AV Mean grad: 3 mm[Hg]
AV Peak grad: 4.6 mm[Hg]
Ao pk vel: 1.07 m/s
Area-P 1/2: 5.32 cm2
Calc EF: 31.5 %
Height: 70 in
MV M vel: 4.51 m/s
MV Peak grad: 81.5 mm[Hg]
MV VTI: 1.8 cm2
S' Lateral: 3.6 cm
Single Plane A2C EF: 36.5 %
Single Plane A4C EF: 27.3 %
Weight: 3168 [oz_av]

## 2023-02-09 LAB — RETICULOCYTES
Immature Retic Fract: 11.7 % (ref 2.3–15.9)
RBC.: 3.35 MIL/uL — ABNORMAL LOW (ref 4.22–5.81)
Retic Count, Absolute: 34.5 10*3/uL (ref 19.0–186.0)
Retic Ct Pct: 1 % (ref 0.4–3.1)

## 2023-02-09 LAB — CBC
HCT: 32.8 % — ABNORMAL LOW (ref 39.0–52.0)
Hemoglobin: 10.3 g/dL — ABNORMAL LOW (ref 13.0–17.0)
MCH: 31 pg (ref 26.0–34.0)
MCHC: 31.4 g/dL (ref 30.0–36.0)
MCV: 98.8 fL (ref 80.0–100.0)
Platelets: 165 10*3/uL (ref 150–400)
RBC: 3.32 MIL/uL — ABNORMAL LOW (ref 4.22–5.81)
RDW: 19.4 % — ABNORMAL HIGH (ref 11.5–15.5)
WBC: 9.2 10*3/uL (ref 4.0–10.5)
nRBC: 0 % (ref 0.0–0.2)

## 2023-02-09 LAB — PHOSPHORUS: Phosphorus: 3.4 mg/dL (ref 2.5–4.6)

## 2023-02-09 LAB — IRON AND TIBC
Iron: 18 ug/dL — ABNORMAL LOW (ref 45–182)
Saturation Ratios: 8 % — ABNORMAL LOW (ref 17.9–39.5)
TIBC: 237 ug/dL — ABNORMAL LOW (ref 250–450)
UIBC: 219 ug/dL

## 2023-02-09 LAB — COMPREHENSIVE METABOLIC PANEL
ALT: 15 U/L (ref 0–44)
AST: 30 U/L (ref 15–41)
Albumin: 3.1 g/dL — ABNORMAL LOW (ref 3.5–5.0)
Alkaline Phosphatase: 62 U/L (ref 38–126)
Anion gap: 14 (ref 5–15)
BUN: 14 mg/dL (ref 8–23)
CO2: 27 mmol/L (ref 22–32)
Calcium: 8.6 mg/dL — ABNORMAL LOW (ref 8.9–10.3)
Chloride: 98 mmol/L (ref 98–111)
Creatinine, Ser: 1.36 mg/dL — ABNORMAL HIGH (ref 0.61–1.24)
GFR, Estimated: 58 mL/min — ABNORMAL LOW (ref >60)
Glucose, Bld: 90 mg/dL (ref 70–99)
Potassium: 4.3 mmol/L (ref 3.5–5.1)
Sodium: 139 mmol/L (ref 135–145)
Total Bilirubin: 1.8 mg/dL — ABNORMAL HIGH (ref 0.0–1.2)
Total Protein: 7.1 g/dL (ref 6.5–8.1)

## 2023-02-09 LAB — TROPONIN I (HIGH SENSITIVITY): Troponin I (High Sensitivity): 67 ng/L — ABNORMAL HIGH (ref <18)

## 2023-02-09 LAB — VITAMIN B12: Vitamin B-12: 432 pg/mL (ref 180–914)

## 2023-02-09 LAB — MAGNESIUM: Magnesium: 1.5 mg/dL — ABNORMAL LOW (ref 1.7–2.4)

## 2023-02-09 LAB — FERRITIN: Ferritin: 305 ng/mL (ref 24–336)

## 2023-02-09 LAB — OSMOLALITY: Osmolality: 300 mosm/kg — ABNORMAL HIGH (ref 275–295)

## 2023-02-09 LAB — PROCALCITONIN: Procalcitonin: 0.35 ng/mL

## 2023-02-09 LAB — CK: Total CK: 37 U/L — ABNORMAL LOW (ref 49–397)

## 2023-02-09 LAB — TSH: TSH: 2.743 u[IU]/mL (ref 0.350–4.500)

## 2023-02-09 LAB — FOLATE: Folate: 32.6 ng/mL (ref >5.9)

## 2023-02-09 LAB — PREALBUMIN: Prealbumin: <5 mg/dL — ABNORMAL LOW (ref 18–38)

## 2023-02-09 MED ORDER — AMIODARONE LOAD VIA INFUSION
150.0000 mg | Freq: Once | INTRAVENOUS | Status: AC
  Administered 2023-02-09: 150 mg via INTRAVENOUS
  Filled 2023-02-09: qty 83.34

## 2023-02-09 MED ORDER — CHLORDIAZEPOXIDE HCL 25 MG PO CAPS
25.0000 mg | ORAL_CAPSULE | Freq: Four times a day (QID) | ORAL | Status: DC
  Administered 2023-02-09 (×2): 25 mg via ORAL
  Filled 2023-02-09 (×2): qty 1

## 2023-02-09 MED ORDER — AMIODARONE HCL IN DEXTROSE 360-4.14 MG/200ML-% IV SOLN
30.0000 mg/h | INTRAVENOUS | Status: DC
  Administered 2023-02-09 – 2023-02-11 (×5): 30 mg/h via INTRAVENOUS
  Filled 2023-02-09 (×5): qty 200

## 2023-02-09 MED ORDER — CHLORDIAZEPOXIDE HCL 25 MG PO CAPS
25.0000 mg | ORAL_CAPSULE | Freq: Four times a day (QID) | ORAL | Status: AC
  Administered 2023-02-09 – 2023-02-10 (×2): 25 mg via ORAL
  Filled 2023-02-09 (×2): qty 1

## 2023-02-09 MED ORDER — METOPROLOL TARTRATE 25 MG PO TABS
25.0000 mg | ORAL_TABLET | Freq: Two times a day (BID) | ORAL | Status: DC
Start: 2023-02-09 — End: 2023-02-11
  Administered 2023-02-09 – 2023-02-11 (×5): 25 mg via ORAL
  Filled 2023-02-09 (×5): qty 1

## 2023-02-09 MED ORDER — AMIODARONE IV BOLUS ONLY 150 MG/100ML: 150.0000 mg | Freq: Once | INTRAVENOUS | Status: DC

## 2023-02-09 MED ORDER — AMIODARONE LOAD VIA INFUSION: 150.0000 mg | Freq: Once | INTRAVENOUS | Status: DC

## 2023-02-09 MED ORDER — AMIODARONE HCL IN DEXTROSE 360-4.14 MG/200ML-% IV SOLN
60.0000 mg/h | INTRAVENOUS | Status: DC
  Administered 2023-02-09: 60 mg/h via INTRAVENOUS
  Filled 2023-02-09: qty 200

## 2023-02-09 MED ORDER — AMIODARONE HCL IN DEXTROSE 360-4.14 MG/200ML-% IV SOLN: 60.0000 mg/h | INTRAVENOUS | Status: DC

## 2023-02-09 MED ORDER — PERFLUTREN LIPID MICROSPHERE
1.0000 mL | INTRAVENOUS | Status: AC | PRN
  Administered 2023-02-09: 3 mL via INTRAVENOUS

## 2023-02-09 NOTE — Evaluation (Signed)
 Physical Therapy Evaluation Patient Details Name: Justin Fleming MRN: [id number] DOB: 06-19-1960 Today's Date: 02/09/2023  History of Present Illness  63 y.o. male Presented 02/08/23 with   tachycardia, febrile, upper respiratory symptoms. +COVID  Afib with RVR   PMH significant of a-fib, CHF, gout, GERD, alcohol abuse.  Clinical Impression   Pt admitted secondary to problem above with deficits below. PTA patient was living with sister (who works) in one-level home with 3 steps to enter. He was ambulatory without a device and drives. Pt currently cannot stand due to bil LE pain (pt reports gout in knees and ankles) and HR sitting EOB 149. Anticipate patient will benefit from PT to address problems listed below. Will continue to follow acutely to maximize functional mobility independence and safety.  Patient will benefit from continued inpatient follow up therapy, <3 hours/day          If plan is discharge home, recommend the following: Two people to help with walking and/or transfers;Assistance with cooking/housework;Assist for transportation;Help with stairs or ramp for entrance   Can travel by private vehicle   No    Equipment Recommendations None recommended by PT  Recommendations for Other Services       Functional Status Assessment Patient has had a recent decline in their functional status and demonstrates the ability to make significant improvements in function in a reasonable and predictable amount of time.     Precautions / Restrictions Precautions Precautions: Fall      Mobility  Bed Mobility Overal bed mobility: Modified Independent             General bed mobility comments: HOB elevated, incr time (on ED stretcher)    Transfers                   General transfer comment: unable due to bil LE pain and HR 149 seated    Ambulation/Gait                  Stairs            Wheelchair Mobility     Tilt Bed    Modified Rankin  (Stroke Patients Only)       Balance Overall balance assessment:  (seated independent)                               Standardized Balance Assessment Standardized Balance Assessment :  (unable)           Pertinent Vitals/Pain Pain Assessment Pain Assessment: 0-10 Pain Score: 9  Pain Location: bil LEs Pain Descriptors / Indicators: Aching, Constant Pain Intervention(s): Limited activity within patient's tolerance, Monitored during session    Home Living Family/patient expects to be discharged to:: Private residence Living Arrangements: Other relatives (sister) Available Help at Discharge: Family;Available PRN/intermittently (sister works) Type of Home: House Home Access: Stairs to enter Entrance Stairs-Rails: Right Secretary/administrator of Steps: 3   Home Layout: One level Home Equipment: Agricultural Consultant (2 wheels) Additional Comments: info re: sister's home    Prior Function Prior Level of Function : Independent/Modified Independent;Driving             Mobility Comments: does not use a device; reports recent gout bil LEs getting worse ADLs Comments: Independent (waiting to receive a shower seat)     Extremity/Trunk Assessment   Upper Extremity Assessment Upper Extremity Assessment: Defer to OT evaluation    Lower Extremity Assessment Lower Extremity  Assessment: RLE deficits/detail;LLE deficits/detail RLE Deficits / Details: incr edema; pt reports gout pain knee and ankle; barely able to move ankles, able to lift leg off bed LLE Deficits / Details: incr edema; pt reports gout pain knee and ankle; barely able to move ankles, able to lift leg off bed    Cervical / Trunk Assessment Cervical / Trunk Assessment: Other exceptions Cervical / Trunk Exceptions: overweight  Communication   Communication Communication: No apparent difficulties Cueing Techniques: Verbal cues  Cognition Arousal: Alert Behavior During Therapy: WFL for tasks  assessed/performed Overall Cognitive Status: Within Functional Limits for tasks assessed                                          General Comments      Exercises Other Exercises Other Exercises: encouraged AROM of bil LEs   Assessment/Plan    PT Assessment Patient needs continued PT services  PT Problem List Decreased strength;Decreased range of motion;Decreased activity tolerance;Decreased mobility;Decreased knowledge of use of DME;Decreased knowledge of precautions;Obesity;Pain       PT Treatment Interventions DME instruction;Gait training;Stair training;Functional mobility training;Therapeutic activities;Therapeutic exercise;Balance training;Patient/family education    PT Goals (Current goals can be found in the Care Plan section)  Acute Rehab PT Goals Patient Stated Goal: decr leg pain PT Goal Formulation: With patient Time For Goal Achievement: 02/23/23 Potential to Achieve Goals: Good    Frequency Min 1X/week     Co-evaluation               AM-PAC PT 6 Clicks Mobility  Outcome Measure Help needed turning from your back to your side while in a flat bed without using bedrails?: None Help needed moving from lying on your back to sitting on the side of a flat bed without using bedrails?: None Help needed moving to and from a bed to a chair (including a wheelchair)?: Total Help needed standing up from a chair using your arms (e.g., wheelchair or bedside chair)?: Total Help needed to walk in hospital room?: Total Help needed climbing 3-5 steps with a railing? : Total 6 Click Score: 12    End of Session Equipment Utilized During Treatment: Oxygen Activity Tolerance: Treatment limited secondary to medical complications (Comment) (HR and LE pain) Patient left: in bed;with call bell/phone within reach (ED) Nurse Communication: Other (comment);Mobility status (limited by gout and HR) PT Visit Diagnosis: Other abnormalities of gait and mobility  (R26.89);Pain;Muscle weakness (generalized) (M62.81) Pain - Right/Left:  (bil) Pain - part of body: Knee;Ankle and joints of foot    Time: 9144-9087 PT Time Calculation (min) (ACUTE ONLY): 17 min   Charges:   PT Evaluation $PT Eval Low Complexity: 1 Low   PT General Charges $$ ACUTE PT VISIT: 1 Visit          Macario RAMAN, PT Acute Rehabilitation Services  Office [phone]   Macario SHAUNNA Soja 02/09/2023, 9:26 AM

## 2023-02-09 NOTE — Progress Notes (Signed)
 DAILY PROGRESS NOTE   Patient Name: Justin Fleming Date of Encounter: 02/09/2023 Cardiologist: Jayson Sierras, MD  Chief Complaint   No complaints  Patient Profile   Justin Fleming is a 63 y.o. male with a hx of alcohol abuse, atrial fibrillation, anxiety, depression, GERD, hypertension who is being seen 02/08/2023 for the evaluation of atrial fibrillation at the request of Dr. Laurice   Subjective   Some diuresis recorded this morning - bedside POCUS yesterday shows LVEF probably 25-30% based on limited images - formal echo pending today. Had some RVR overnight. Creatinine improved today with diuresis. Trop remained flat [phone] Viral panel positive for coronavirus OC43. Had continued afib with RVR overnight- amiodraone at 60 mg/hr.  Objective   Vitals:   02/09/23 0628 02/09/23 0700 02/09/23 0804 02/09/23 0806  BP:  107/89 (!) 113/96   Pulse:  (!) 159 (!) 123   Resp:  19 18   Temp: 100 F (37.8 C)  99.1 F (37.3 C)   TempSrc: Oral  Oral   SpO2:  100% 100% 100%  Weight:      Height:        Intake/Output Summary (Last 24 hours) at 02/09/2023 9170 Last data filed at 02/09/2023 9171 Gross per 24 hour  Intake 370.91 ml  Output 500 ml  Net -129.09 ml   Filed Weights   02/08/23 1619  Weight: 89.8 kg    Physical Exam   General appearance: alert and no distress Neck: JVD - 6 cm above sternal notch, no carotid bruit, and thyroid not enlarged, symmetric, no tenderness/mass/nodules Lungs: diminished breath sounds bibasilar Heart: irregularly irregular rhythm Abdomen: soft, non-tender; bowel sounds normal; no masses,  no organomegaly Extremities: edema 3-4+ bilateral Pulses: 2+ and symmetric Skin: Skin color, texture, turgor normal. No rashes or lesions Neurologic: Grossly normal PSych: Pleasant  Inpatient Medications    Scheduled Meds:  amiodarone   150 mg Intravenous Once   apixaban   5 mg Oral BID   furosemide   40 mg Intravenous BID   guaiFENesin   600  mg Oral BID   midodrine   15 mg Oral TID WC   nicotine   21 mg Transdermal Daily   pantoprazole   40 mg Oral BID   sodium chloride  flush  3 mL Intravenous Q12H   thiamine   100 mg Oral Daily    Continuous Infusions:  sodium chloride      amiodarone  60 mg/hr (02/09/23 0551)   Followed by   amiodarone       PRN Meds: sodium chloride , acetaminophen  **OR** acetaminophen , HYDROcodone -acetaminophen , ondansetron  **OR** ondansetron  (ZOFRAN ) IV, sodium chloride  flush   Labs   Results for orders placed or performed during the hospital encounter of 02/08/23 (from the past 48 hours)  Resp panel by RT-PCR (RSV, Flu A&B, Covid) Anterior Nasal Swab     Status: None   Collection Time: 02/08/23  4:39 PM   Specimen: Anterior Nasal Swab  Result Value Ref Range   SARS Coronavirus 2 by RT PCR NEGATIVE NEGATIVE   Influenza A by PCR NEGATIVE NEGATIVE   Influenza B by PCR NEGATIVE NEGATIVE    Comment: (NOTE) The Xpert Xpress SARS-CoV-2/FLU/RSV plus assay is intended as an aid in the diagnosis of influenza from Nasopharyngeal swab specimens and should not be used as a sole basis for treatment. Nasal washings and aspirates are unacceptable for Xpert Xpress SARS-CoV-2/FLU/RSV testing.  Fact Sheet for Patients: bloggercourse.com  Fact Sheet for Healthcare Providers: seriousbroker.it  This test is not yet approved or cleared by the  [place]  FDA and has been authorized for detection and/or diagnosis of SARS-CoV-2 by FDA under an Emergency Use Authorization (EUA). This EUA will remain in effect (meaning this test can be used) for the duration of the COVID-19 declaration under Section 564(b)(1) of the Act, 21 U.S.C. section 360bbb-3(b)(1), unless the authorization is terminated or revoked.     Resp Syncytial Virus by PCR NEGATIVE NEGATIVE    Comment: (NOTE) Fact Sheet for Patients: bloggercourse.com  Fact Sheet for  Healthcare Providers: seriousbroker.it  This test is not yet approved or cleared by the [place]  FDA and has been authorized for detection and/or diagnosis of SARS-CoV-2 by FDA under an Emergency Use Authorization (EUA). This EUA will remain in effect (meaning this test can be used) for the duration of the COVID-19 declaration under Section 564(b)(1) of the Act, 21 U.S.C. section 360bbb-3(b)(1), unless the authorization is terminated or revoked.  Performed at [facility] Lab, [address], [place], [place] [address]   CBC with Differential     Status: Abnormal   Collection Time: 02/08/23  4:39 PM  Result Value Ref Range   WBC 8.2 4.0 - 10.5 K/uL   RBC 3.18 (L) 4.22 - 5.81 MIL/uL   Hemoglobin 9.9 (L) 13.0 - 17.0 g/dL   HCT 68.7 (L) 60.9 - 47.9 %   MCV 98.1 80.0 - 100.0 fL   MCH 31.1 26.0 - 34.0 pg   MCHC 31.7 30.0 - 36.0 g/dL   RDW 80.9 (H) 88.4 - 84.4 %   Platelets 172 150 - 400 K/uL   nRBC 0.0 0.0 - 0.2 %   Neutrophils Relative % 85 %   Neutro Abs 7.0 1.7 - 7.7 K/uL   Lymphocytes Relative 8 %   Lymphs Abs 0.6 (L) 0.7 - 4.0 K/uL   Monocytes Relative 6 %   Monocytes Absolute 0.5 0.1 - 1.0 K/uL   Eosinophils Relative 1 %   Eosinophils Absolute 0.1 0.0 - 0.5 K/uL   Basophils Relative 0 %   Basophils Absolute 0.0 0.0 - 0.1 K/uL   Immature Granulocytes 0 %   Abs Immature Granulocytes 0.03 0.00 - 0.07 K/uL    Comment: Performed at [facility], [address] [place], [place] [address]  Troponin I (High Sensitivity)     Status: Abnormal   Collection Time: 02/08/23  4:39 PM  Result Value Ref Range   Troponin I (High Sensitivity) 49 (H) <18 ng/L    Comment: (NOTE) Elevated high sensitivity troponin I (hsTnI) values and significant  changes across serial measurements may suggest ACS but many other  chronic and acute conditions are known to elevate hsTnI results.  Refer to the Links section for chest pain algorithms and additional   guidance. Performed at [facility], [address], [place], [place] [address]   Ethanol     Status: None   Collection Time: 02/08/23  4:39 PM  Result Value Ref Range   Alcohol, Ethyl (B) <10 <10 mg/dL    Comment: (NOTE) Lowest detectable limit for serum alcohol is 10 mg/dL.  For medical purposes only. Performed at [facility], [address], [place], [place] [address]   Comprehensive metabolic panel     Status: Abnormal   Collection Time: 02/08/23  4:39 PM  Result Value Ref Range   Sodium 142 135 - 145 mmol/L   Potassium 3.7 3.5 - 5.1 mmol/L   Chloride 100 98 - 111 mmol/L   CO2 27 22 -  32 mmol/L   Glucose, Bld 102 (H) 70 - 99 mg/dL    Comment: Glucose reference range applies only to samples taken after fasting for at least 8 hours.   BUN 15 8 - 23 mg/dL   Creatinine, Ser 8.50 (H) 0.61 - 1.24 mg/dL   Calcium 8.8 (L) 8.9 - 10.3 mg/dL   Total Protein 7.2 6.5 - 8.1 g/dL   Albumin  3.3 (L) 3.5 - 5.0 g/dL   AST 21 15 - 41 U/L   ALT 13 0 - 44 U/L   Alkaline Phosphatase 60 38 - 126 U/L   Total Bilirubin 1.3 (H) 0.0 - 1.2 mg/dL   GFR, Estimated 52 (L) >60 mL/min    Comment: (NOTE) Calculated using the CKD-EPI Creatinine Equation (2021)    Anion gap 15 5 - 15    Comment: Performed at [facility], [address], [place], [place] [address]  Protime-INR     Status: Abnormal   Collection Time: 02/08/23  4:39 PM  Result Value Ref Range   Prothrombin Time 16.0 (H) 11.4 - 15.2 seconds   INR 1.3 (H) 0.8 - 1.2    Comment: (NOTE) INR goal varies based on device and disease states. Performed at [facility], [address] [place], [place] [address]   Brain natriuretic peptide     Status: Abnormal   Collection Time: 02/08/23  4:39 PM  Result Value Ref Range   B Natriuretic Peptide 570.9 (H) 0.0 - 100.0 pg/mL    Comment: Performed at [facility], [address], [place], [place] [address]  Culture, blood (routine x 2)     Status: None  (Preliminary result)   Collection Time: 02/08/23  4:39 PM   Specimen: BLOOD LEFT HAND  Result Value Ref Range   Specimen Description BLOOD LEFT HAND    Special Requests      BOTTLES DRAWN AEROBIC AND ANAEROBIC Blood Culture results may not be optimal due to an inadequate volume of blood received in culture bottles   Culture      NO GROWTH < 24 HOURS Performed at [facility], [address], [place], [place] [address]    Report Status PENDING   Culture, blood (routine x 2)     Status: None (Preliminary result)   Collection Time: 02/08/23  4:39 PM   Specimen: BLOOD LEFT FOREARM  Result Value Ref Range   Specimen Description BLOOD LEFT FOREARM    Special Requests      BOTTLES DRAWN AEROBIC AND ANAEROBIC Blood Culture results may not be optimal due to an inadequate volume of blood received in culture bottles   Culture      NO GROWTH < 24 HOURS Performed at [facility], [address], [place], [place] [address]    Report Status PENDING   Respiratory (~20 pathogens) panel by PCR     Status: Abnormal   Collection Time: 02/08/23  4:39 PM   Specimen: Nasopharyngeal Swab; Respiratory  Result Value Ref Range   Adenovirus NOT DETECTED NOT DETECTED   Coronavirus 229E NOT DETECTED NOT DETECTED    Comment: (NOTE) The Coronavirus on the Respiratory Panel, DOES NOT test for the novel  Coronavirus (2019 nCoV)    Coronavirus HKU1 NOT DETECTED NOT DETECTED   Coronavirus NL63 NOT DETECTED NOT DETECTED   Coronavirus OC43 DETECTED (A) NOT DETECTED   Metapneumovirus NOT DETECTED NOT DETECTED   Rhinovirus / Enterovirus NOT DETECTED NOT DETECTED   Influenza  A NOT DETECTED NOT DETECTED   Influenza B NOT DETECTED NOT DETECTED   Parainfluenza Virus 1 NOT DETECTED NOT DETECTED   Parainfluenza Virus 2 NOT DETECTED NOT DETECTED   Parainfluenza Virus 3 NOT DETECTED NOT DETECTED   Parainfluenza Virus 4 NOT DETECTED NOT DETECTED   Respiratory Syncytial Virus NOT DETECTED NOT DETECTED    Bordetella pertussis NOT DETECTED NOT DETECTED   Bordetella Parapertussis NOT DETECTED NOT DETECTED   Chlamydophila pneumoniae NOT DETECTED NOT DETECTED   Mycoplasma pneumoniae NOT DETECTED NOT DETECTED    Comment: Performed at [facility] Lab, [address] [place], [place] [address]  I-Stat Lactic Acid     Status: None   Collection Time: 02/08/23  4:49 PM  Result Value Ref Range   Lactic Acid, Venous 1.4 0.5 - 1.9 mmol/L  Urinalysis, w/ Reflex to Culture (Infection Suspected) -Urine, Clean Catch     Status: Abnormal   Collection Time: 02/08/23  6:40 PM  Result Value Ref Range   Specimen Source URINE, CLEAN CATCH    Color, Urine YELLOW YELLOW   APPearance CLEAR CLEAR   Specific Gravity, Urine 1.021 1.005 - 1.030   pH 5.0 5.0 - 8.0   Glucose, UA NEGATIVE NEGATIVE mg/dL   Hgb urine dipstick NEGATIVE NEGATIVE   Bilirubin Urine NEGATIVE NEGATIVE   Ketones, ur NEGATIVE NEGATIVE mg/dL   Protein, ur 30 (A) NEGATIVE mg/dL   Nitrite NEGATIVE NEGATIVE   Leukocytes,Ua NEGATIVE NEGATIVE   RBC / HPF 0-5 0 - 5 RBC/hpf   WBC, UA 0-5 0 - 5 WBC/hpf    Comment:        Reflex urine culture not performed if WBC <=10, OR if Squamous epithelial cells >5. If Squamous epithelial cells >5 suggest recollection.    Bacteria, UA NONE SEEN NONE SEEN   Squamous Epithelial / HPF 0-5 0 - 5 /HPF   Mucus PRESENT     Comment: Performed at [facility], [address], [place], [place] [address]  Urine rapid drug screen (hosp performed)     Status: Abnormal   Collection Time: 02/08/23  6:40 PM  Result Value Ref Range   Opiates NONE DETECTED NONE DETECTED   Cocaine NONE DETECTED NONE DETECTED   Benzodiazepines POSITIVE (A) NONE DETECTED   Amphetamines NONE DETECTED NONE DETECTED   Tetrahydrocannabinol NONE DETECTED NONE DETECTED   Barbiturates NONE DETECTED NONE DETECTED    Comment: (NOTE) DRUG SCREEN FOR MEDICAL PURPOSES ONLY.  IF CONFIRMATION IS NEEDED FOR ANY PURPOSE, NOTIFY LAB WITHIN 5  DAYS.  LOWEST DETECTABLE LIMITS FOR URINE DRUG SCREEN Drug Class                     Cutoff (ng/mL) Amphetamine and metabolites    1000 Barbiturate and metabolites    200 Benzodiazepine                 200 Opiates and metabolites        300 Cocaine and metabolites        300 THC                            50 Performed at [facility], [address], [place], [place] [address]   Troponin I (High Sensitivity)     Status: Abnormal   Collection Time: 02/08/23  6:44 PM  Result Value Ref Range   Troponin I (High Sensitivity) 53 (H) <18 ng/L  Comment: (NOTE) Elevated high sensitivity troponin I (hsTnI) values and significant  changes across serial measurements may suggest ACS but many other  chronic and acute conditions are known to elevate hsTnI results.  Refer to the Links section for chest pain algorithms and additional  guidance. Performed at [facility], [address], [place], [place] [address]   I-Stat Lactic Acid     Status: None   Collection Time: 02/08/23  6:55 PM  Result Value Ref Range   Lactic Acid, Venous 1.1 0.5 - 1.9 mmol/L  Blood gas, venous     Status: Abnormal   Collection Time: 02/08/23  7:20 PM  Result Value Ref Range   pH, Ven 7.52 (H) 7.25 - 7.43   pCO2, Ven 39 (L) 44 - 60 mmHg   pO2, Ven 62 (H) 32 - 45 mmHg   Bicarbonate 31.8 (H) 20.0 - 28.0 mmol/L   Acid-Base Excess 8.3 (H) 0.0 - 2.0 mmol/L   O2 Saturation 94.9 %   Patient temperature 37.0    Drawn by [id number]     Comment: Performed at [facility], [address] [place], [place] [address]  Osmolality, urine     Status: None   Collection Time: 02/08/23  7:21 PM  Result Value Ref Range   Osmolality, Ur 599 300 - 900 mOsm/kg    Comment: REPEATED TO VERIFY Performed at [facility], [address], [place], [place] [address]   Creatinine, urine, random     Status: None   Collection Time: 02/08/23  7:21 PM  Result Value Ref Range   Creatinine, Urine 162 mg/dL    Comment:  Performed at [facility], [address], [place], [place] [address]  Sodium, urine, random     Status: None   Collection Time: 02/08/23  7:21 PM  Result Value Ref Range   Sodium, Ur 104 mmol/L    Comment: Performed at [facility], [address] [place], [place] [address]  Ammonia     Status: Abnormal   Collection Time: 02/08/23  7:21 PM  Result Value Ref Range   Ammonia 37 (H) 9 - 35 umol/L    Comment: Performed at [facility], [address], [place], [place] [address]  Troponin I (High Sensitivity)     Status: Abnormal   Collection Time: 02/08/23 11:04 PM  Result Value Ref Range   Troponin I (High Sensitivity) 68 (H) <18 ng/L    Comment: (NOTE) Elevated high sensitivity troponin I (hsTnI) values and significant  changes across serial measurements may suggest ACS but many other  chronic and acute conditions are known to elevate hsTnI results.  Refer to the Links section for chest pain algorithms and additional  guidance. Performed at [facility], [address] [place], [place] [address]   Vitamin B12     Status: None   Collection Time: 02/09/23  2:22 AM  Result Value Ref Range   Vitamin B-12 432 180 - 914 pg/mL    Comment: (NOTE) This assay is not validated for testing neonatal or myeloproliferative syndrome specimens for Vitamin B12 levels. Performed at [facility], [address], [place], [place] [address]   Folate     Status: None   Collection Time: 02/09/23  2:22 AM  Result Value Ref Range   Folate 32.6 >5.9 ng/mL    Comment: Performed at [facility], [address], [place], [place] [address]  Iron  and TIBC     Status: Abnormal   Collection Time: 02/09/23  2:22 AM  Result Value Ref Range   Iron  18 (L) 45 - 182 ug/dL   TIBC 762 (L) 749 - 549 ug/dL   Saturation Ratios 8 (L) 17.9 - 39.5 %   UIBC 219 ug/dL    Comment: Performed at [facility], [address] [place], [place] [address]  Ferritin     Status: None    Collection Time: 02/09/23  2:22 AM  Result Value Ref Range   Ferritin 305 24 - 336 ng/mL    Comment: Performed at [facility], [address], [place], [place] [address]  Reticulocytes     Status: Abnormal   Collection Time: 02/09/23  2:22 AM  Result Value Ref Range   Retic Ct Pct 1.0 0.4 - 3.1 %   RBC. 3.35 (L) 4.22 - 5.81 MIL/uL   Retic Count, Absolute 34.5 19.0 - 186.0 K/uL   Immature Retic Fract 11.7 2.3 - 15.9 %    Comment: Performed at [facility], [address] [place], [place] [address]  Procalcitonin     Status: None   Collection Time: 02/09/23  2:22 AM  Result Value Ref Range   Procalcitonin 0.35 ng/mL    Comment:        Interpretation: PCT (Procalcitonin) <= 0.5 ng/mL: Systemic infection (sepsis) is not likely. Local bacterial infection is possible. (NOTE)       Sepsis PCT Algorithm           Lower Respiratory Tract                                      Infection PCT Algorithm    ----------------------------     ----------------------------         PCT < 0.25 ng/mL                PCT < 0.10 ng/mL          Strongly encourage             Strongly discourage   discontinuation of antibiotics    initiation of antibiotics    ----------------------------     -----------------------------       PCT 0.25 - 0.50 ng/mL            PCT 0.10 - 0.25 ng/mL               OR       >80% decrease in PCT            Discourage initiation of                                            antibiotics      Encourage discontinuation           of antibiotics    ----------------------------     -----------------------------         PCT >= 0.50 ng/mL              PCT 0.26 - 0.50 ng/mL               AND        <80% decrease in PCT  Encourage initiation of                                             antibiotics       Encourage continuation           of antibiotics    ----------------------------     -----------------------------        PCT >= 0.50 ng/mL                   PCT > 0.50 ng/mL               AND         increase in PCT                  Strongly encourage                                      initiation of antibiotics    Strongly encourage escalation           of antibiotics                                     -----------------------------                                           PCT <= 0.25 ng/mL                                                 OR                                        > 80% decrease in PCT                                      Discontinue / Do not initiate                                             antibiotics  Performed at [facility], [address], [place], [place] [address]   CK     Status: Abnormal   Collection Time: 02/09/23  2:22 AM  Result Value Ref Range   Total CK 37 (L) 49 - 397 U/L    Comment: HEMOLYSIS AT THIS LEVEL MAY AFFECT RESULT Performed at [facility], [address], [place], [place] [address]   Magnesium      Status: Abnormal   Collection Time: 02/09/23  2:22 AM  Result Value Ref Range   Magnesium  1.5 (L) 1.7 - 2.4 mg/dL    Comment: HEMOLYSIS AT THIS LEVEL MAY AFFECT RESULT Performed at [facility], [address], [place], [place]  [address]   Phosphorus     Status: None   Collection Time: 02/09/23  2:22 AM  Result Value Ref Range   Phosphorus 3.4 2.5 - 4.6 mg/dL    Comment: HEMOLYSIS AT THIS LEVEL MAY AFFECT RESULT Performed at [facility], [address], [place], [place] [address]   Osmolality     Status: Abnormal   Collection Time: 02/09/23  2:22 AM  Result Value Ref Range   Osmolality 300 (H) 275 - 295 mOsm/kg    Comment: Performed at [facility], [address], [place], [place] [address]  TSH     Status: None   Collection Time: 02/09/23  2:22 AM  Result Value Ref Range   TSH 2.743 0.350 - 4.500 uIU/mL    Comment: Performed by a 3rd Generation assay with a functional sensitivity of <=0.01 uIU/mL. Performed at [facility], [address] [place], [place] [address]   CBC     Status: Abnormal   Collection Time: 02/09/23  2:22 AM  Result Value Ref Range   WBC 9.2 4.0 - 10.5 K/uL   RBC 3.32 (L) 4.22 - 5.81 MIL/uL   Hemoglobin 10.3 (L) 13.0 - 17.0 g/dL   HCT 67.1 (L) 60.9 - 47.9 %   MCV 98.8 80.0 - 100.0 fL   MCH 31.0 26.0 - 34.0 pg   MCHC 31.4 30.0 - 36.0 g/dL   RDW 80.5 (H) 88.4 - 84.4 %   Platelets 165 150 - 400 K/uL   nRBC 0.0 0.0 - 0.2 %    Comment: Performed at [facility] Lab, [address], [place], [place] [address]  Comprehensive metabolic panel     Status: Abnormal   Collection Time: 02/09/23  2:22 AM  Result Value Ref Range   Sodium 139 135 - 145 mmol/L   Potassium 4.3 3.5 - 5.1 mmol/L    Comment: HEMOLYSIS AT THIS LEVEL MAY AFFECT RESULT   Chloride 98 98 - 111 mmol/L   CO2 27 22 - 32 mmol/L   Glucose, Bld 90 70 - 99 mg/dL    Comment: Glucose reference range applies only to samples taken after fasting for at least 8 hours.   BUN 14 8 - 23 mg/dL   Creatinine, Ser 8.63 (H) 0.61 - 1.24 mg/dL   Calcium 8.6 (L) 8.9 - 10.3 mg/dL   Total Protein 7.1 6.5 - 8.1 g/dL   Albumin  3.1 (L) 3.5 - 5.0 g/dL   AST 30 15 - 41 U/L    Comment: HEMOLYSIS AT THIS LEVEL MAY AFFECT RESULT   ALT 15 0 - 44 U/L    Comment: HEMOLYSIS AT THIS LEVEL MAY AFFECT RESULT   Alkaline Phosphatase 62 38 - 126 U/L   Total Bilirubin 1.8 (H) 0.0 - 1.2 mg/dL    Comment: HEMOLYSIS AT THIS LEVEL MAY AFFECT RESULT   GFR, Estimated 58 (L) >60 mL/min    Comment: (NOTE) Calculated using the CKD-EPI Creatinine Equation (2021)    Anion gap 14 5 - 15    Comment: Performed at [facility] Lab, [address] [place], [place] [address]  Prealbumin     Status: Abnormal   Collection Time: 02/09/23  2:22 AM  Result Value Ref Range   Prealbumin <5 (L) 18 - 38 mg/dL    Comment: Performed at [facility] Lab, [address], [place], [place] [address]  Troponin I (High Sensitivity)     Status: Abnormal   Collection  Time: 02/09/23  2:22 AM   Result Value Ref Range   Troponin I (High Sensitivity) 67 (H) <18 ng/L    Comment: (NOTE) Elevated high sensitivity troponin I (hsTnI) values and significant  changes across serial measurements may suggest ACS but many other  chronic and acute conditions are known to elevate hsTnI results.  Refer to the Links section for chest pain algorithms and additional  guidance. Performed at [facility] Lab, [address] [place], [place] [address]     ECG   N/A  Telemetry   Afib with RVR - Personally Reviewed  Radiology    US  Abdomen Complete Result Date: 02/08/2023 CLINICAL DATA:  [id number] with acute kidney injury. [id number].  Anasarca. History of liver disease by prior abdominal ultrasound order. EXAM: ABDOMEN ULTRASOUND COMPLETE COMPARISON:  Right upper quadrant ultrasound 01/04/2023 FINDINGS: Gallbladder: There is a mixture of sludge and tiny stones layering within the gallbladder, as before. There is thickening of the free wall which measures 6 mm today, previously 5.4 mm which was not associated with a positive sonographic Murphy's sign. This could be due to chronic liver disease, passive wall congestion, reactive wall thickening or chronic cholecystitis. Common bile duct: Diameter: 3.4 mm. No intrahepatic biliary dilatation. Liver: No focal lesion identified. Within normal limits in parenchymal echogenicity. The liver is mildly prominent measuring 20 cm in length. Some images suggest slight surface lobulation in the left lobe which may be seen with cirrhosis. Portal vein is patent on color Doppler imaging with normal direction of blood flow towards the liver. The hepatic portal main vein is mildly prominent at 15 mm. IVC: Widely patent. Pancreas: Only the neck and body sections are visible. The head and tail, uncinate process obscured by bowel gas. The visualized portions unremarkable. Spleen: Obscured by bowel gas.  Not seen. Right Kidney: Length: 8.9 cm. Echogenicity of the renal cortex is  mildly increased relative to liver. No mass, stones or hydronephrosis visualized. Left Kidney: Length: 9.9 cm. Echogenicity within normal limits. No mass, stones or hydronephrosis visualized. Abdominal aorta: No aneurysm visualized. Other findings: Mild perihepatic ascites, slightly greater than previously. IMPRESSION: 1. Mild perihepatic ascites, slightly greater than previously. 2. Mildly prominent liver with slight surface lobulation in the left lobe which may be seen with cirrhosis. 3. Mildly prominent hepatic portal main vein at 15 mm. No portal flow reversal. 4. Sludge and tiny stones in the gallbladder with thickening of the free wall which was not associated with a positive sonographic Murphy's sign. This could be due to chronic liver disease, passive wall congestion, reactive wall thickening or chronic cholecystitis. Similar wall thickening was noted on 01/04/2023. 5. Mildly increased echogenicity of the right renal cortex relative to liver. This can be seen with medical renal disease. Left kidney is sonographically unremarkable. 6. Spleen not seen. 7. Limited visualization of the pancreas, unremarkable where visible. Electronically Signed   By: Francis Quam M.D.   On: 02/08/2023 23:16   DG Chest Port 1 View Result Date: 02/08/2023 CLINICAL DATA:  Shortness of breath today EXAM: PORTABLE CHEST 1 VIEW COMPARISON:  01/11/2023 FINDINGS: Shallow inspiration with atelectasis in the lung bases. Cardiac enlargement. Small right pleural effusion. No vascular congestion or edema. No pneumothorax. Mediastinal contours appear intact. Degenerative changes in the spine. IMPRESSION: Shallow inspiration with atelectasis in the lung bases. Cardiac enlargement. Small right pleural effusion. Electronically Signed   By: Elsie Gravely M.D.   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 He is on high dose IV amiodarone . Will add metoprolol  25 mg BID for additional rate control. Formal echo pending, but at least moderate LV dysfunction - creatinine improving with diuresis, so continue. May need earlier TEE-guided cardioversion if afib continues to be difficult to rate control. Was on midodrine  PTA for hypotension.  Time Spent Directly with Patient:  I have spent a total of 35 minutes with the patient reviewing hospital notes, telemetry, EKGs, labs and examining the patient as well as establishing an assessment and plan that was discussed personally with the patient.  > 50% of time was spent in direct patient care.  Length of Stay:  LOS: 0 days   Justin KYM Maxcy, MD, [facility], FACP  [place]  [facility]  Medical Director of the [facility] Lipid Disorders &  Cardiovascular Risk Reduction Clinic Diplomate of the American Board of Clinical Lipidology Attending Cardiologist  Direct Dial: [phone]  Fax: [phone]  Website:  [email or web address]  Justin Fleming 02/09/2023, 8:29 AM

## 2023-02-09 NOTE — Progress Notes (Signed)
 PROGRESS NOTE    Justin Fleming  FMW:[id number] DOB: 1960-12-04 DOA: 02/08/2023 PCP: Kayla Jeoffrey RAMAN, FNP  Outpatient Specialists:     Brief Narrative:  Patient is a 63 year old male past medical history significant for a-fib, CHF, gout, GERD, and alcohol abuse.  Patient continues to drink significant amount of alcohol.  Patient was admitted with A-fib RVR, tachypnea and worsening edema.  Echocardiogram revealed left ventricular ejection fraction of 30 to 35%, global hypokinesis of the left ventricle, reduced right ventricular function with moderate enlargement.  Worsening renal function that is improving with diuresis.  Cardiology input is appreciated.  Patient is currently on amiodarone  drip.  Patient remains in A-fib RVR.  Respiratory panel was positive for coronavirus positive for OC43.  02/09/2023: Patient seen alongside 2 sisters.  Patient reports significant alcohol use and worsening leg edema.  No chest pain reported.   Assessment & Plan:   Principal Problem:   Atrial fibrillation with RVR (HCC) Active Problems:   Alcohol abuse   Atrial fibrillation with rapid ventricular response (HCC)   Chronic anemia   Elevated troponin   Acute on chronic diastolic CHF (congestive heart failure) (HCC)   Alcohol induced fatty liver   URI (upper respiratory infection)   Alcohol abuse, continuous: -Counseled to quit alcohol. -CIWA Protocol. -Low-dose to try Librium  25 Mg p.o. every 6 hourly x 4 doses and reviewed.  Patient remains significantly tachycardic, despite amiodarone  drip.     Chronic anemia Obtain anemia panel MCV of 98.1, Likely multifactorial Transfuse for Hg <7 , rapidly dropping or  if symptomatic   Atrial fibrillation with RVR [facility]) -Cardiology input is appreciated. -Patient is on amiodarone  drip. -Cardiology team plans to add beta-blockers. -Continue apixaban . -Echocardiogram reveals new cardiomyopathy, probably tachycardia induced.   Acute  CHF/cardiomyopathy: -Cardiology team is directing care. -Controlled heart rate. -Continue IV Lasix .    Elevated troponin In the setting of tachycardia Continue to monitor    Alcohol induced fatty liver -Refer to GI team on discharge.   URI (upper respiratory infection) Respiratory panel significant for coronavirus.   Blood cultures pending Procalcitonin is 0.35.   DVT prophylaxis: Eliquis . Code Status: Full code Family Communication: 2 sisters Disposition Plan: Inpatient.   Consultants:  Cardiology.  Procedures:  Echocardiogram.  Antimicrobials:  None.   Subjective: No shortness of breath. No chest pain. Leg edema.  Objective: Vitals:   02/09/23 1715 02/09/23 1730 02/09/23 1745 02/09/23 1830  BP: (!) 111/93 (!) 108/90 (!) 109/90 (!) 107/92  Pulse: (!) 102 62 (!) 123 (!) 109  Resp: 19 17 19 18   Temp:      TempSrc:      SpO2: 96% 96% 98% 97%  Weight:      Height:        Intake/Output Summary (Last 24 hours) at 02/09/2023 1907 Last data filed at 02/09/2023 1202 Gross per 24 hour  Intake 370.91 ml  Output 1400 ml  Net -1029.09 ml   Filed Weights   02/08/23 1619  Weight: 89.8 kg    Examination:  General exam: Appears calm and comfortable. Respiratory system: Clear to auscultation. Respiratory effort normal. Cardiovascular system: S1 & S2, irregular and tachycardic.   Gastrointestinal system: Abdomen is soft and nontender. Central nervous system: Alert and oriented.  Patient moves all extremities. Extremities: 1+ bilateral lower extremity edema  Data Reviewed: I have personally reviewed following labs and imaging studies  CBC: Recent Labs  Lab 02/08/23 1639 02/09/23 0222  WBC 8.2 9.2  NEUTROABS 7.0  --  HGB 9.9* 10.3*  HCT 31.2* 32.8*  MCV 98.1 98.8  PLT 172 165   Basic Metabolic Panel: Recent Labs  Lab 02/08/23 1639 02/09/23 0222  NA 142 139  K 3.7 4.3  CL 100 98  CO2 27 27  GLUCOSE 102* 90  BUN 15 14  CREATININE 1.49* 1.36*   CALCIUM 8.8* 8.6*  MG  --  1.5*  PHOS  --  3.4   GFR: Estimated Creatinine Clearance: 61 mL/min (A) (by C-G formula based on SCr of 1.36 mg/dL (H)). Liver Function Tests: Recent Labs  Lab 02/08/23 1639 02/09/23 0222  AST 21 30  ALT 13 15  ALKPHOS 60 62  BILITOT 1.3* 1.8*  PROT 7.2 7.1  ALBUMIN  3.3* 3.1*   No results for input(s): LIPASE, AMYLASE in the last 168 hours. Recent Labs  Lab 02/08/23 1921  AMMONIA 37*   Coagulation Profile: Recent Labs  Lab 02/08/23 1639  INR 1.3*   Cardiac Enzymes: Recent Labs  Lab 02/09/23 0222  CKTOTAL 37*   BNP (last 3 results) No results for input(s): PROBNP in the last 8760 hours. HbA1C: No results for input(s): HGBA1C in the last 72 hours. CBG: No results for input(s): GLUCAP in the last 168 hours. Lipid Profile: No results for input(s): CHOL, HDL, LDLCALC, TRIG, CHOLHDL, LDLDIRECT in the last 72 hours. Thyroid Function Tests: Recent Labs    02/09/23 0222  TSH 2.743   Anemia Panel: Recent Labs    02/09/23 0222  VITAMINB12 432  FOLATE 32.6  FERRITIN 305  TIBC 237*  IRON  18*  RETICCTPCT 1.0   Urine analysis:    Component Value Date/Time   COLORURINE YELLOW 02/08/2023 1840   APPEARANCEUR CLEAR 02/08/2023 1840   LABSPEC 1.021 02/08/2023 1840   PHURINE 5.0 02/08/2023 1840   GLUCOSEU NEGATIVE 02/08/2023 1840   HGBUR NEGATIVE 02/08/2023 1840   BILIRUBINUR NEGATIVE 02/08/2023 1840   KETONESUR NEGATIVE 02/08/2023 1840   PROTEINUR 30 (A) 02/08/2023 1840   NITRITE NEGATIVE 02/08/2023 1840   LEUKOCYTESUR NEGATIVE 02/08/2023 1840   Sepsis Labs: @LABRCNTIP (procalcitonin:4,lacticidven:4)  ) Recent Results (from the past 240 hours)  Resp panel by RT-PCR (RSV, Flu A&B, Covid) Anterior Nasal Swab     Status: None   Collection Time: 02/08/23  4:39 PM   Specimen: Anterior Nasal Swab  Result Value Ref Range Status   SARS Coronavirus 2 by RT PCR NEGATIVE NEGATIVE Final   Influenza A by PCR  NEGATIVE NEGATIVE Final   Influenza B by PCR NEGATIVE NEGATIVE Final    Comment: (NOTE) The Xpert Xpress SARS-CoV-2/FLU/RSV plus assay is intended as an aid in the diagnosis of influenza from Nasopharyngeal swab specimens and should not be used as a sole basis for treatment. Nasal washings and aspirates are unacceptable for Xpert Xpress SARS-CoV-2/FLU/RSV testing.  Fact Sheet for Patients: bloggercourse.com  Fact Sheet for Healthcare Providers: seriousbroker.it  This test is not yet approved or cleared by the [place]  FDA and has been authorized for detection and/or diagnosis of SARS-CoV-2 by FDA under an Emergency Use Authorization (EUA). This EUA will remain in effect (meaning this test can be used) for the duration of the COVID-19 declaration under Section 564(b)(1) of the Act, 21 U.S.C. section 360bbb-3(b)(1), unless the authorization is terminated or revoked.     Resp Syncytial Virus by PCR NEGATIVE NEGATIVE Final    Comment: (NOTE) Fact Sheet for Patients: bloggercourse.com  Fact Sheet for Healthcare Providers: seriousbroker.it  This test is not yet approved or cleared by the [place]  [place] FDA and has been authorized for detection and/or diagnosis of SARS-CoV-2 by FDA under an Emergency Use Authorization (EUA). This EUA will remain in effect (meaning this test can be used) for the duration of the COVID-19 declaration under Section 564(b)(1) of the Act, 21 U.S.C. section 360bbb-3(b)(1), unless the authorization is terminated or revoked.  Performed at [facility] Lab, [address], [place], [place] [address]   Culture, blood (routine x 2)     Status: None (Preliminary result)   Collection Time: 02/08/23  4:39 PM   Specimen: BLOOD LEFT HAND  Result Value Ref Range Status   Specimen Description BLOOD LEFT HAND  Final   Special Requests   Final    BOTTLES DRAWN  AEROBIC AND ANAEROBIC Blood Culture results may not be optimal due to an inadequate volume of blood received in culture bottles   Culture   Final    NO GROWTH < 24 HOURS Performed at [facility], [address], [place], [place] [address]    Report Status PENDING  Incomplete  Culture, blood (routine x 2)     Status: None (Preliminary result)   Collection Time: 02/08/23  4:39 PM   Specimen: BLOOD LEFT FOREARM  Result Value Ref Range Status   Specimen Description BLOOD LEFT FOREARM  Final   Special Requests   Final    BOTTLES DRAWN AEROBIC AND ANAEROBIC Blood Culture results may not be optimal due to an inadequate volume of blood received in culture bottles   Culture   Final    NO GROWTH < 24 HOURS Performed at [facility], [address] [place], [place] [address]    Report Status PENDING  Incomplete  Respiratory (~20 pathogens) panel by PCR     Status: Abnormal   Collection Time: 02/08/23  4:39 PM   Specimen: Nasopharyngeal Swab; Respiratory  Result Value Ref Range Status   Adenovirus NOT DETECTED NOT DETECTED Final   Coronavirus 229E NOT DETECTED NOT DETECTED Final    Comment: (NOTE) The Coronavirus on the Respiratory Panel, DOES NOT test for the novel  Coronavirus (2019 nCoV)    Coronavirus HKU1 NOT DETECTED NOT DETECTED Final   Coronavirus NL63 NOT DETECTED NOT DETECTED Final   Coronavirus OC43 DETECTED (A) NOT DETECTED Final   Metapneumovirus NOT DETECTED NOT DETECTED Final   Rhinovirus / Enterovirus NOT DETECTED NOT DETECTED Final   Influenza A NOT DETECTED NOT DETECTED Final   Influenza B NOT DETECTED NOT DETECTED Final   Parainfluenza Virus 1 NOT DETECTED NOT DETECTED Final   Parainfluenza Virus 2 NOT DETECTED NOT DETECTED Final   Parainfluenza Virus 3 NOT DETECTED NOT DETECTED Final   Parainfluenza Virus 4 NOT DETECTED NOT DETECTED Final   Respiratory Syncytial Virus NOT DETECTED NOT DETECTED Final   Bordetella pertussis NOT DETECTED NOT DETECTED Final    Bordetella Parapertussis NOT DETECTED NOT DETECTED Final   Chlamydophila pneumoniae NOT DETECTED NOT DETECTED Final   Mycoplasma pneumoniae NOT DETECTED NOT DETECTED Final    Comment: Performed at [facility], [address], [place], [place] [address]         Radiology Studies: ECHOCARDIOGRAM COMPLETE Result Date: 02/09/2023    ECHOCARDIOGRAM REPORT   Patient Name:   LEDFORD GOODSON Date of Exam: 02/09/2023 Medical Rec #:  [id number]        Height:       70.0 in Accession #:    [id number]       Weight:  198.0 lb Date of Birth:  06/06/1960        BSA:          2.078 m Patient Age:    63 years         BP:           113/69 mmHg Patient Gender: M                HR:           120 bpm. Exam Location:  Inpatient Procedure: 2D Echo, Cardiac Doppler, Color Doppler and Intracardiac            Opacification Agent Indications:    Congestive Heart Failure  History:        Patient has prior history of Echocardiogram examinations, most                 recent 12/31/2022. Risk Factors:Hypertension and Former Smoker.  Sonographer:    Ozell Free Referring Phys: [id number] ANASTASSIA DOUTOVA  Sonographer Comments: Technically challenging study due to limited acoustic windows. Image acquisition challenging due to patient body habitus. IMPRESSIONS  1. Left ventricular ejection fraction, by estimation, is 30 to 35%. The left ventricle has moderately decreased function. The left ventricle demonstrates global hypokinesis. Left ventricular diastolic function could not be evaluated.  2. Right ventricular systolic function is moderately reduced. The right ventricular size is moderately enlarged. Tricuspid regurgitation signal is inadequate for assessing PA pressure.  3. Left atrial size was moderately dilated.  4. Right atrial size was moderately dilated.  5. The mitral valve is normal in structure. Mild to moderate mitral valve regurgitation. No evidence of mitral stenosis.  6. The aortic valve is tricuspid. Aortic valve  regurgitation is not visualized. No aortic stenosis is present. Comparison(s): Prior images reviewed side by side. The left ventricular function is worsened. FINDINGS  Left Ventricle: Mobile echodensity at the apex appears to be a false tendon. Left ventricular ejection fraction, by estimation, is 30 to 35%. The left ventricle has moderately decreased function. The left ventricle demonstrates global hypokinesis. Definity  contrast agent was given IV to delineate the left ventricular endocardial borders. The left ventricular internal cavity size was normal in size. There is borderline concentric left ventricular hypertrophy. Left ventricular diastolic function could not be evaluated due to atrial fibrillation. Left ventricular diastolic function could not be evaluated. Right Ventricle: The right ventricular size is moderately enlarged. Right vetricular wall thickness was not well visualized. Right ventricular systolic function is moderately reduced. Tricuspid regurgitation signal is inadequate for assessing PA pressure. Left Atrium: Left atrial size was moderately dilated. Right Atrium: Right atrial size was moderately dilated. Pericardium: There is no evidence of pericardial effusion. Mitral Valve: The mitral valve is normal in structure. Mild to moderate mitral valve regurgitation, with centrally-directed jet. No evidence of mitral valve stenosis. MV peak gradient, 6.9 mmHg. The mean mitral valve gradient is 3.7 mmHg. Tricuspid Valve: The tricuspid valve is grossly normal. Tricuspid valve regurgitation is not demonstrated. Aortic Valve: The aortic valve is tricuspid. Aortic valve regurgitation is not visualized. No aortic stenosis is present. Aortic valve mean gradient measures 3.0 mmHg. Aortic valve peak gradient measures 4.6 mmHg. Aortic valve area, by VTI measures 2.04 cm. Pulmonic Valve: The pulmonic valve was not well visualized. Pulmonic valve regurgitation is not visualized. No evidence of pulmonic stenosis.  Aorta: The aortic root is normal in size and structure. IAS/Shunts: The interatrial septum was not well visualized.  LEFT VENTRICLE PLAX 2D LVIDd:  4.30 cm     Diastology LVIDs:         3.60 cm     LV e' medial:    5.51 cm/s LV PW:         1.10 cm     LV E/e' medial:  23.1 LV IVS:        1.20 cm     LV e' lateral:   9.29 cm/s LVOT diam:     1.90 cm     LV E/e' lateral: 13.7 LV SV:         29 LV SV Index:   14 LVOT Area:     2.84 cm  LV Volumes (MOD) LV vol d, MOD A2C: 98.5 ml LV vol d, MOD A4C: 70.6 ml LV vol s, MOD A2C: 62.5 ml LV vol s, MOD A4C: 51.3 ml LV SV MOD A2C:     36.0 ml LV SV MOD A4C:     70.6 ml LV SV MOD BP:      26.5 ml RIGHT VENTRICLE            IVC RV Basal diam:  4.50 cm    IVC diam: 2.30 cm RV S prime:     8.37 cm/s LEFT ATRIUM              Index        RIGHT ATRIUM           Index LA diam:        4.20 cm  2.02 cm/m   RA Area:     22.20 cm LA Vol (A2C):   117.0 ml 56.29 ml/m  RA Volume:   70.70 ml  34.02 ml/m LA Vol (A4C):   55.2 ml  26.56 ml/m LA Biplane Vol: 81.1 ml  39.02 ml/m  AORTIC VALVE AV Area (Vmax):    1.89 cm AV Area (Vmean):   1.80 cm AV Area (VTI):     2.04 cm AV Vmax:           107.43 cm/s AV Vmean:          79.233 cm/s AV VTI:            0.141 m AV Peak Grad:      4.6 mmHg AV Mean Grad:      3.0 mmHg LVOT Vmax:         71.57 cm/s LVOT Vmean:        50.200 cm/s LVOT VTI:          0.102 m LVOT/AV VTI ratio: 0.72  AORTA Ao Root diam: 3.50 cm MITRAL VALVE MV Area (PHT): 5.32 cm     SHUNTS MV Area VTI:   1.80 cm     Systemic VTI:  0.10 m MV Peak grad:  6.9 mmHg     Systemic Diam: 1.90 cm MV Mean grad:  3.7 mmHg MV Vmax:       1.32 m/s MV Vmean:      88.5 cm/s MV Decel Time: 143 msec MR Peak grad: 81.5 mmHg MR Vmax:      451.33 cm/s MV E velocity: 127.33 cm/s Jerel Balding MD Electronically signed by Jerel Balding MD Signature Date/Time: 02/09/2023/10:31:05 AM    Final    US  Abdomen Complete Result Date: 02/08/2023 CLINICAL DATA:  [id number] with acute kidney injury.  [id number].  Anasarca. History of liver disease by prior abdominal ultrasound order. EXAM: ABDOMEN ULTRASOUND COMPLETE COMPARISON:  Right upper quadrant ultrasound 01/04/2023 FINDINGS: Gallbladder: There is a  mixture of sludge and tiny stones layering within the gallbladder, as before. There is thickening of the free wall which measures 6 mm today, previously 5.4 mm which was not associated with a positive sonographic Murphy's sign. This could be due to chronic liver disease, passive wall congestion, reactive wall thickening or chronic cholecystitis. Common bile duct: Diameter: 3.4 mm. No intrahepatic biliary dilatation. Liver: No focal lesion identified. Within normal limits in parenchymal echogenicity. The liver is mildly prominent measuring 20 cm in length. Some images suggest slight surface lobulation in the left lobe which may be seen with cirrhosis. Portal vein is patent on color Doppler imaging with normal direction of blood flow towards the liver. The hepatic portal main vein is mildly prominent at 15 mm. IVC: Widely patent. Pancreas: Only the neck and body sections are visible. The head and tail, uncinate process obscured by bowel gas. The visualized portions unremarkable. Spleen: Obscured by bowel gas.  Not seen. Right Kidney: Length: 8.9 cm. Echogenicity of the renal cortex is mildly increased relative to liver. No mass, stones or hydronephrosis visualized. Left Kidney: Length: 9.9 cm. Echogenicity within normal limits. No mass, stones or hydronephrosis visualized. Abdominal aorta: No aneurysm visualized. Other findings: Mild perihepatic ascites, slightly greater than previously. IMPRESSION: 1. Mild perihepatic ascites, slightly greater than previously. 2. Mildly prominent liver with slight surface lobulation in the left lobe which may be seen with cirrhosis. 3. Mildly prominent hepatic portal main vein at 15 mm. No portal flow reversal. 4. Sludge and tiny stones in the gallbladder with thickening of the free  wall which was not associated with a positive sonographic Murphy's sign. This could be due to chronic liver disease, passive wall congestion, reactive wall thickening or chronic cholecystitis. Similar wall thickening was noted on 01/04/2023. 5. Mildly increased echogenicity of the right renal cortex relative to liver. This can be seen with medical renal disease. Left kidney is sonographically unremarkable. 6. Spleen not seen. 7. Limited visualization of the pancreas, unremarkable where visible. Electronically Signed   By: Francis Quam M.D.   On: 02/08/2023 23:16   DG Chest Port 1 View Result Date: 02/08/2023 CLINICAL DATA:  Shortness of breath today EXAM: PORTABLE CHEST 1 VIEW COMPARISON:  01/11/2023 FINDINGS: Shallow inspiration with atelectasis in the lung bases. Cardiac enlargement. Small right pleural effusion. No vascular congestion or edema. No pneumothorax. Mediastinal contours appear intact. Degenerative changes in the spine. IMPRESSION: Shallow inspiration with atelectasis in the lung bases. Cardiac enlargement. Small right pleural effusion. Electronically Signed   By: Elsie Gravely M.D.   On: 02/08/2023 18:32        Scheduled Meds:  amiodarone   150 mg Intravenous Once   apixaban   5 mg Oral BID   chlordiazePOXIDE   25 mg Oral QID   furosemide   40 mg Intravenous BID   guaiFENesin   600 mg Oral BID   metoprolol  tartrate  25 mg Oral BID   midodrine   15 mg Oral TID WC   nicotine   21 mg Transdermal Daily   pantoprazole   40 mg Oral BID   sodium chloride  flush  3 mL Intravenous Q12H   thiamine   100 mg Oral Daily   Continuous Infusions:  sodium chloride      amiodarone  30 mg/hr (02/09/23 1154)     LOS: 0 days    Time spent: 35 minutes.    Leatrice Chapel, MD  [facility] Pager #: [phone] 7PM-7AM contact night coverage as above

## 2023-02-09 NOTE — ED Notes (Signed)
HR 140-160 and BP 143/108. Cardiology paged.

## 2023-02-09 NOTE — ED Notes (Signed)
Order to give bolus dose of Amiodarone then resume infusion following bolus.

## 2023-02-09 NOTE — ED Notes (Addendum)
igned      Patient heart rate in the 130's to 150 patient is on a Amiodarone drip Liane Comber , the physician assistant was notified at 1312. Per Liane Comber if the patient blood pressure is affected or having any symptom to notify him.

## 2023-02-09 NOTE — Progress Notes (Signed)
 OT Cancellation Note  Patient Details Name: Justin Fleming MRN: [id number] DOB: 1960-05-16   Cancelled Treatment:    Reason Eval/Treat Not Completed: Medical issues which prohibited therapy.  PT informed OT patient with severe gout pain to lower extremities limiting OOB, with elevated HR.  OT to continue efforts as appropriate.    Saory Carriero D Min Tunnell 02/09/2023, 1:41 PM 02/09/2023  RP, OTR/L  Acute Rehabilitation Services  Office:  [phone]

## 2023-02-09 NOTE — TOC Initial Note (Signed)
 Transition of Care [facility]) - Initial/Assessment Note    Patient Details  Name: Justin Fleming MRN: [id number] Date of Birth: 1960-05-02  Transition [facility]) CM/SW Contact:    Niels LITTIE Portugal, LCSW Phone Number: 02/09/2023, 10:51 AM  Clinical Narrative:                 CSW spoke with patient to discuss PT recommendations for SNF. Patient states he is not agreeable for SNF placement at this time due to being unable to walk. CSW explained that SNF is for short term rehab and could assist him with being ambulatory again. Patient states he walks fine when his legs and feet are not swollen and wants to wait to see if those issues can be addressed before agreeing to SNF placement. Patient reports he lives with his sister and has 3 steps to enter the home. Patient denies having any DME at home. Patient agreeable for TOC to follow up with him once admitted to a medical unit.    Barriers to Discharge: Continued Medical Work up   Patient Goals and CMS Choice            Expected Discharge Plan and Services       Living arrangements for the past 2 months: Single Family Home                                      Prior Living Arrangements/Services Living arrangements for the past 2 months: Single Family Home Lives with:: Siblings   Do you feel safe going back to the place where you live?: Yes      Need for Family Participation in Patient Care: Yes (Comment) Care giver support system in place?: Yes (comment)   Criminal Activity/Legal Involvement Pertinent to Current Situation/Hospitalization: No - Comment as needed  Activities of Daily Living   ADL Screening (condition at time of admission) Independently performs ADLs?: No Does the patient have a NEW difficulty with bathing/dressing/toileting/self-feeding that is expected to last >3 days?: No (needs assist) Does the patient have a NEW difficulty with getting in/out of bed, walking, or climbing stairs that is expected to last  >3 days?: No (needs assist) Does the patient have a NEW difficulty with communication that is expected to last >3 days?: No Is the patient deaf or have difficulty hearing?: No Does the patient have difficulty seeing, even when wearing glasses/contacts?: No Does the patient have difficulty concentrating, remembering, or making decisions?: Yes  Permission Sought/Granted                  Emotional Assessment Appearance:: Appears stated age Attitude/Demeanor/Rapport: Engaged Affect (typically observed): Accepting Orientation: : Oriented to Self, Oriented to Place, Oriented to  Time, Oriented to Situation Alcohol / Substance Use: Alcohol Use (Most recent use - 12/24) Psych Involvement: No (comment)  Admission diagnosis:  Atrial fibrillation with RVR (HCC) [I48.91] Patient Active Problem List   Diagnosis Date Noted   Atrial fibrillation with RVR (HCC) 02/08/2023   Acute on chronic diastolic CHF (congestive heart failure) (HCC) 02/08/2023   Alcohol induced fatty liver 02/08/2023   URI (upper respiratory infection) 02/08/2023   Severe sepsis (HCC) 01/16/2023   Hypokalemia 01/15/2023   Acute idiopathic gout 01/14/2023   Acute heart failure with preserved ejection fraction (HFpEF) (HCC) 01/13/2023   Elevated troponin 01/07/2023   Vitamin B12 deficiency 01/07/2023   Chronic anemia 01/04/2023   Persistent  atrial fibrillation (HCC) 01/01/2023   Anemia due to acute blood loss 01/01/2023   Alcohol intoxication (HCC) 12/30/2022   Atrial fibrillation with rapid ventricular response (HCC) 12/30/2022   Lactic acidosis 12/30/2022   MDD (major depressive disorder) (HCC) 09/12/2014   Alcohol abuse 09/05/2014   Hepatomegaly 05/23/2014   Insomnia 07/12/2013   GAD (generalized anxiety disorder) 06/23/2013   Essential hypertension, benign 06/23/2013   Tremor 06/23/2013   GERD (gastroesophageal reflux disease) 06/23/2013   Tobacco use disorder 06/23/2013   PCP:  Kayla Jeoffrey RAMAN,  FNP Pharmacy:   [phone] - [place], [place] - [address] AT [facility] [address] [place] [address] Phone: [phone] Fax: [phone]  [id number] - [place],  - [address] [facility] [address] [facility] [address] [place] [address] Phone: [phone] Fax: [phone]     Social Drivers of Health (SDOH) Social History: SDOH Screenings   Food Insecurity: No Food Insecurity (02/08/2023)  Housing: Low Risk  (02/08/2023)  Transportation Needs: No Transportation Needs (02/08/2023)  Utilities: Not At Risk (02/08/2023)  Alcohol Screen: Low Risk  (02/02/2018)  Depression (PHQ2-9): Low Risk  (02/02/2018)  Social Connections: Socially Isolated (02/08/2023)  Tobacco Use: Medium Risk (02/08/2023)   SDOH Interventions:     Readmission Risk Interventions    01/11/2023   12:44 PM  Readmission Risk Prevention Plan  Transportation Screening Complete  PCP or Specialist Appt within 5-7 Days Not Complete  Home Care Screening Complete  Medication Review (RN CM) Complete

## 2023-02-09 NOTE — ED Notes (Signed)
Pt stool occurrence/changed into new brief.

## 2023-02-09 NOTE — Progress Notes (Signed)
Echocardiogram 2D Echocardiogram has been performed.  Justin Fleming Caroline Longie 02/09/2023, 10:16 AM

## 2023-02-10 DIAGNOSIS — F1010 Alcohol abuse, uncomplicated: Secondary | ICD-10-CM | POA: Diagnosis not present

## 2023-02-10 DIAGNOSIS — I5033 Acute on chronic diastolic (congestive) heart failure: Secondary | ICD-10-CM | POA: Diagnosis not present

## 2023-02-10 DIAGNOSIS — I5021 Acute systolic (congestive) heart failure: Secondary | ICD-10-CM

## 2023-02-10 DIAGNOSIS — I4891 Unspecified atrial fibrillation: Secondary | ICD-10-CM | POA: Diagnosis not present

## 2023-02-10 DIAGNOSIS — R7989 Other specified abnormal findings of blood chemistry: Secondary | ICD-10-CM | POA: Diagnosis not present

## 2023-02-10 DIAGNOSIS — J069 Acute upper respiratory infection, unspecified: Secondary | ICD-10-CM | POA: Diagnosis not present

## 2023-02-10 LAB — RENAL FUNCTION PANEL
Albumin: 2.6 g/dL — ABNORMAL LOW (ref 3.5–5.0)
Anion gap: 14 (ref 5–15)
BUN: 16 mg/dL (ref 8–23)
CO2: 27 mmol/L (ref 22–32)
Calcium: 8.1 mg/dL — ABNORMAL LOW (ref 8.9–10.3)
Chloride: 93 mmol/L — ABNORMAL LOW (ref 98–111)
Creatinine, Ser: 1.4 mg/dL — ABNORMAL HIGH (ref 0.61–1.24)
GFR, Estimated: 56 mL/min — ABNORMAL LOW (ref >60)
Glucose, Bld: 209 mg/dL — ABNORMAL HIGH (ref 70–99)
Phosphorus: 3.6 mg/dL (ref 2.5–4.6)
Potassium: 3.4 mmol/L — ABNORMAL LOW (ref 3.5–5.1)
Sodium: 134 mmol/L — ABNORMAL LOW (ref 135–145)

## 2023-02-10 LAB — CBC WITH DIFFERENTIAL/PLATELET
Abs Immature Granulocytes: 0.05 10*3/uL (ref 0.00–0.07)
Basophils Absolute: 0 10*3/uL (ref 0.0–0.1)
Basophils Relative: 1 %
Eosinophils Absolute: 0.1 10*3/uL (ref 0.0–0.5)
Eosinophils Relative: 1 %
HCT: 28.7 % — ABNORMAL LOW (ref 39.0–52.0)
Hemoglobin: 9.3 g/dL — ABNORMAL LOW (ref 13.0–17.0)
Immature Granulocytes: 1 %
Lymphocytes Relative: 16 %
Lymphs Abs: 1.3 10*3/uL (ref 0.7–4.0)
MCH: 31 pg (ref 26.0–34.0)
MCHC: 32.4 g/dL (ref 30.0–36.0)
MCV: 95.7 fL (ref 80.0–100.0)
Monocytes Absolute: 0.7 10*3/uL (ref 0.1–1.0)
Monocytes Relative: 9 %
Neutro Abs: 6 10*3/uL (ref 1.7–7.7)
Neutrophils Relative %: 72 %
Platelets: 160 10*3/uL (ref 150–400)
RBC: 3 MIL/uL — ABNORMAL LOW (ref 4.22–5.81)
RDW: 18.9 % — ABNORMAL HIGH (ref 11.5–15.5)
WBC: 8.2 10*3/uL (ref 4.0–10.5)
nRBC: 0 % (ref 0.0–0.2)

## 2023-02-10 LAB — HEPATIC FUNCTION PANEL
ALT: 10 U/L (ref 0–44)
AST: 25 U/L (ref 15–41)
Albumin: 2.6 g/dL — ABNORMAL LOW (ref 3.5–5.0)
Alkaline Phosphatase: 50 U/L (ref 38–126)
Bilirubin, Direct: 0.5 mg/dL — ABNORMAL HIGH (ref 0.0–0.2)
Indirect Bilirubin: 0.8 mg/dL (ref 0.3–0.9)
Total Bilirubin: 1.3 mg/dL — ABNORMAL HIGH (ref 0.0–1.2)
Total Protein: 6.2 g/dL — ABNORMAL LOW (ref 6.5–8.1)

## 2023-02-10 LAB — MAGNESIUM: Magnesium: 1.2 mg/dL — ABNORMAL LOW (ref 1.7–2.4)

## 2023-02-10 MED ORDER — POTASSIUM CHLORIDE CRYS ER 20 MEQ PO TBCR
40.0000 meq | EXTENDED_RELEASE_TABLET | Freq: Two times a day (BID) | ORAL | Status: AC
  Administered 2023-02-10 (×2): 40 meq via ORAL
  Filled 2023-02-10 (×2): qty 2

## 2023-02-10 MED ORDER — METOPROLOL TARTRATE 5 MG/5ML IV SOLN
5.0000 mg | Freq: Once | INTRAVENOUS | Status: AC
  Administered 2023-02-10: 5 mg via INTRAVENOUS
  Filled 2023-02-10: qty 5

## 2023-02-10 MED ORDER — MAGNESIUM SULFATE 4 GM/100ML IV SOLN
4.0000 g | Freq: Once | INTRAVENOUS | Status: AC
  Administered 2023-02-10: 4 g via INTRAVENOUS
  Filled 2023-02-10: qty 100

## 2023-02-10 MED ORDER — ENSURE ENLIVE PO LIQD
237.0000 mL | Freq: Two times a day (BID) | ORAL | Status: DC
  Administered 2023-02-11 – 2023-02-21 (×16): 237 mL via ORAL

## 2023-02-10 NOTE — Progress Notes (Signed)
 PROGRESS NOTE    Justin Fleming  FMW:[id number] DOB: 07-18-1960 DOA: 02/08/2023 PCP: Kayla Jeoffrey RAMAN, FNP   Brief Narrative:  The patient is a 63 year old AAM with a past medical history significant for but not limited to atrial fibrillation, CHF, gout, GERD, history of alcohol abuse and other comorbidities who continues to drink significant alcohol.  Is admitted with A-fib with RVR tachypnea and worsening edema and echocardiogram revealed left ventricular ejection fraction of 30 to 35%, global hypokinesis of left ventricle and reduced right ventricular function with moderate enlargement.  He also was noted to have worsening renal function.  Currently is getting diuresis and cardiology's been consulted and he is on amiodarone  drip for his uncontrolled heart rates with A-fib with RVR.  Respiratory virus panel was positive for coronavirus OC 43.  Leg edema is improving heart rates are better controlled today but cardiology is planning on DCCV TEE in a.m.  Assessment and Plan:  Alcohol Abuse, continuous: -Counseled to quit alcohol. -CIWA Protocol. -Low-dose to try Librium  25 Mg p.o. every 6 hourly x 4 doses and reviewed.   -Patient remains significantly tachycardic, despite amiodarone  drip but HR's are improving    Chronic Normocytic Anemia -Likely Multifactorial. Hgb/Hct Trend: Recent Labs  Lab 01/13/23 0418 01/14/23 0440 01/15/23 0457 01/16/23 0343 02/08/23 1639 02/09/23 0222 02/10/23 0455  HGB 8.3* 8.0* 8.4* 8.2* 9.9* 10.3* 9.3*  HCT 25.7* 26.0* 26.1* 26.3* 31.2* 32.8* 28.7*  MCV 104.9* 104.4* 103.2* 103.1* 98.1 98.8 95.7  -Obtained Anemia Panel with iron  level of 18, UIBC 219, TIBC of 237, saturation ratios of 8%, ferritin low 3-5, folate level 32.6 and a vitamin B12 432 -Transfuse for Hg <7 , rapidly dropping or  if symptomatic -Continue to monitor for signs and symptoms bleeding; no overt bleeding noted   Atrial fibrillation with RVR [facility]) -Cardiology input is  appreciated. -Patient is on amiodarone  drip and being continued. -Cardiology team plans to add beta-blockers and he is now on metoprolol  tartrate 25 mg p.o. twice daily and cardiology recommends transitioning to Toprol -XL prior to discharge given his reduced EF -Continue apixaban . -TSH was 2.743 -Echocardiogram reveals new cardiomyopathy, probably tachycardia induced. -Cardiology is planning for TEE/DCCV tomorrow   Acute on Chronic systolic CHF/Cardiomyopathy: -Cardiology team is directing care. -Currently he remains on amiodarone  and cardiology scheduling him for TEE/DCCV tomorrow -Continues to be markedly volume overloaded and BNP was elevated 570 on admission -Echocardiogram done and showed an LVEF of 30 to 35% with global hypokinesis, moderately reduced right ventricular function, moderate biatrial enlargement and mild to moderate MR; this EF is down from 50 to 55% back in December -Continue IV Lasix  40 mg twice daily and cardiology is hesitant to increase with his soft blood pressures  Hypokalemia -Patient's K+ Level Trend: Recent Labs  Lab 01/13/23 0418 01/14/23 0440 01/15/23 0457 01/16/23 0343 02/08/23 1639 02/09/23 0222 02/10/23 0455  K 3.5 3.2* 3.7 3.4* 3.7 4.3 3.4*  -Replete with po Kcl 40 mEQ BID x2 -Continue to Monitor and Replete as Necessary -Repeat CMP in the AM   Hypomagnesemia -Patient's Mag Level Trend: Recent Labs  Lab 01/14/23 0440 01/15/23 0457 02/09/23 0222 02/10/23 0455  MG 1.8 2.0 1.5* 1.2*  -Replete with IV Mag Sulfate 4 grams -Continue to Monitor and Replete as Necessary -Repeat Mag in the AM   AKI vs CKD stage IIIa -BUN/Cr Trend: Recent Labs  Lab 01/13/23 0418 01/14/23 0440 01/15/23 0457 01/16/23 0343 02/08/23 1639 02/09/23 0222 02/10/23 0455  BUN 20 19 22  22  15 14 16   CREATININE 1.23 1.07 1.18 0.96 1.49* 1.36* 1.40*  -Avoid Nephrotoxic Medications, Contrast Dyes, Hypotension and Dehydration to Ensure Adequate Renal Perfusion and  will need to Renally Adjust Meds -Continue to Monitor and Trend Renal Function carefully and repeat CMP in the AM   Hyponatremia -Mild and likely in the setting of volume overload. Na+ Trend: Recent Labs  Lab 01/13/23 0418 01/14/23 0440 01/15/23 0457 01/16/23 0343 02/08/23 1639 02/09/23 0222 02/10/23 0455  NA 135 133* 130* 134* 142 139 134*  -Continue monitor trend and repeat CMP in a.m.   Elevated Troponin -In the setting of tachycardia -Troponin went from 68 -> 67 -Continue to monitor  -Cardiology is following   Alcohol induced fatty liver -Refer to GI team on discharge.   URI (Upper Respiratory Infection) with Coronavirus (Not COVID) -Respiratory panel significant for Coronavirus OC43.   -Blood cultures pending -Procalcitonin is 0.35.  -Patient is still intermittently been febrile and had SIRS criteria; spiked a temperature of 102.9 last night -Fortunately he has no leukocytosis -Continue with supportive care  Hyperbilirubinemia, improving -Bilirubin Trend: Recent Labs  Lab 02/08/23 1639 02/09/23 0222 02/10/23 0500  BILITOT 1.3* 1.8* 1.3*  -Continue to Monitor and Trend and Repeat CMP in the AM  Hypoalbuminemia -Patient's Albumin  Trend: Recent Labs  Lab 02/08/23 1639 02/09/23 0222 02/10/23 0455 02/10/23 0500  ALBUMIN  3.3* 3.1* 2.6* 2.6*  -Continue to Monitor and Trend and repeat CMP in the AM  Overweight -Complicates overall prognosis and care -Estimated body mass index is 28.41 kg/m as calculated from the following:   Height as of this encounter: 5' 10 (1.778 m).   Weight as of this encounter: 89.8 kg.  -Weight Loss and Dietary Counseling given     DVT prophylaxis:  apixaban  (ELIQUIS ) tablet 5 mg    Code Status: Full Code Family Communication: No family currently at bedside  Disposition Plan:  Level of care: Telemetry Cardiac Status is: Inpatient Remains inpatient appropriate because: Needs further clinical improvement and clearance by the  specialists   Consultants:  Cardiology  Procedures:  ECHOCARDIOGRAM IMPRESSIONS     1. Left ventricular ejection fraction, by estimation, is 30 to 35%. The  left ventricle has moderately decreased function. The left ventricle  demonstrates global hypokinesis. Left ventricular diastolic function could  not be evaluated.   2. Right ventricular systolic function is moderately reduced. The right  ventricular size is moderately enlarged. Tricuspid regurgitation signal is  inadequate for assessing PA pressure.   3. Left atrial size was moderately dilated.   4. Right atrial size was moderately dilated.   5. The mitral valve is normal in structure. Mild to moderate mitral valve  regurgitation. No evidence of mitral stenosis.   6. The aortic valve is tricuspid. Aortic valve regurgitation is not  visualized. No aortic stenosis is present.   Comparison(s): Prior images reviewed side by side. The left ventricular  function is worsened.   FINDINGS   Left Ventricle: Mobile echodensity at the apex appears to be a false  tendon. Left ventricular ejection fraction, by estimation, is 30 to 35%.  The left ventricle has moderately decreased function. The left ventricle  demonstrates global hypokinesis.  Definity  contrast agent was given IV to delineate the left ventricular  endocardial borders. The left ventricular internal cavity size was normal  in size. There is borderline concentric left ventricular hypertrophy. Left  ventricular diastolic function  could not be evaluated due to atrial fibrillation. Left ventricular  diastolic function could not be  evaluated.   Right Ventricle: The right ventricular size is moderately enlarged. Right  vetricular wall thickness was not well visualized. Right ventricular  systolic function is moderately reduced. Tricuspid regurgitation signal is  inadequate for assessing PA  pressure.   Left Atrium: Left atrial size was moderately dilated.   Right  Atrium: Right atrial size was moderately dilated.   Pericardium: There is no evidence of pericardial effusion.   Mitral Valve: The mitral valve is normal in structure. Mild to moderate  mitral valve regurgitation, with centrally-directed jet. No evidence of  mitral valve stenosis. MV peak gradient, 6.9 mmHg. The mean mitral valve  gradient is 3.7 mmHg.   Tricuspid Valve: The tricuspid valve is grossly normal. Tricuspid valve  regurgitation is not demonstrated.   Aortic Valve: The aortic valve is tricuspid. Aortic valve regurgitation is  not visualized. No aortic stenosis is present. Aortic valve mean gradient  measures 3.0 mmHg. Aortic valve peak gradient measures 4.6 mmHg. Aortic  valve area, by VTI measures 2.04  cm.   Pulmonic Valve: The pulmonic valve was not well visualized. Pulmonic valve  regurgitation is not visualized. No evidence of pulmonic stenosis.   Aorta: The aortic root is normal in size and structure.   IAS/Shunts: The interatrial septum was not well visualized.     LEFT VENTRICLE  PLAX 2D  LVIDd:         4.30 cm     Diastology  LVIDs:         3.60 cm     LV e' medial:    5.51 cm/s  LV PW:         1.10 cm     LV E/e' medial:  23.1  LV IVS:        1.20 cm     LV e' lateral:   9.29 cm/s  LVOT diam:     1.90 cm     LV E/e' lateral: 13.7  LV SV:         29  LV SV Index:   14  LVOT Area:     2.84 cm    LV Volumes (MOD)  LV vol d, MOD A2C: 98.5 ml  LV vol d, MOD A4C: 70.6 ml  LV vol s, MOD A2C: 62.5 ml  LV vol s, MOD A4C: 51.3 ml  LV SV MOD A2C:     36.0 ml  LV SV MOD A4C:     70.6 ml  LV SV MOD BP:      26.5 ml   RIGHT VENTRICLE            IVC  RV Basal diam:  4.50 cm    IVC diam: 2.30 cm  RV S prime:     8.37 cm/s   LEFT ATRIUM              Index        RIGHT ATRIUM           Index  LA diam:        4.20 cm  2.02 cm/m   RA Area:     22.20 cm  LA Vol (A2C):   117.0 ml 56.29 ml/m  RA Volume:   70.70 ml  34.02 ml/m  LA Vol (A4C):   55.2 ml  26.56  ml/m  LA Biplane Vol: 81.1 ml  39.02 ml/m   AORTIC VALVE  AV Area (Vmax):    1.89 cm  AV Area (Vmean):   1.80 cm  AV  Area (VTI):     2.04 cm  AV Vmax:           107.43 cm/s  AV Vmean:          79.233 cm/s  AV VTI:            0.141 m  AV Peak Grad:      4.6 mmHg  AV Mean Grad:      3.0 mmHg  LVOT Vmax:         71.57 cm/s  LVOT Vmean:        50.200 cm/s  LVOT VTI:          0.102 m  LVOT/AV VTI ratio: 0.72    AORTA  Ao Root diam: 3.50 cm   MITRAL VALVE  MV Area (PHT): 5.32 cm     SHUNTS  MV Area VTI:   1.80 cm     Systemic VTI:  0.10 m  MV Peak grad:  6.9 mmHg     Systemic Diam: 1.90 cm  MV Mean grad:  3.7 mmHg  MV Vmax:       1.32 m/s  MV Vmean:      88.5 cm/s  MV Decel Time: 143 msec  MR Peak grad: 81.5 mmHg  MR Vmax:      451.33 cm/s  MV E velocity: 127.33 cm/s   Antimicrobials:  Anti-infectives (From admission, onward)    None       Subjective: Seen and examined at bedside and was doing okay but continues to have leg swelling.  States his right leg is always more swollen than his left.  No nausea or vomiting.  Feels okay.  Denies any shortness of breath.  Objective: Vitals:   02/10/23 1230 02/10/23 1340 02/10/23 1511 02/10/23 1611  BP: 91/67 (!) 89/72 91/75 100/86  Pulse: (!) 101 (!) 101 90   Resp: 16     Temp:      TempSrc:  Oral    SpO2: 99% 97% 99%   Weight:      Height:        Intake/Output Summary (Last 24 hours) at 02/10/2023 1748 Last data filed at 02/10/2023 1400 Gross per 24 hour  Intake 120 ml  Output 1800 ml  Net -1680 ml   Filed Weights   02/08/23 1619  Weight: 89.8 kg   Examination: Physical Exam:  Constitutional: WN/WD overweight chronically ill-appearing AAM in no acute distress Respiratory: Diminished to auscultation bilaterally with some coarse breath sounds and does have some crackles noted but no appreciable rales, rhonchi or wheezing.  Has a normal respiratory effort and is wearing supplemental oxygen nasal  cannula Cardiovascular: Tachycardic and irregularly irregular with heart rates in the 100s to 110s, no murmurs / rubs / gallops. S1 and S2 auscultated.  1-2+ lower extremity pitting edema Abdomen: Soft, non-tender, non-distended. Bowel sounds positive.  GU: Deferred. Musculoskeletal: No clubbing / cyanosis of digits/nails. No joint deformity upper and lower extremities.  Skin: No rashes, lesions, ulcers on a limited skin evaluation. No induration; Warm and dry.  Neurologic: CN 2-12 grossly intact with no focal deficits. Romberg sign and cerebellar reflexes not assessed.  Psychiatric: Normal judgment and insight. Alert and oriented x 3. Normal mood and appropriate affect.   Data Reviewed: I have personally reviewed following labs and imaging studies  CBC: Recent Labs  Lab 02/08/23 1639 02/09/23 0222 02/10/23 0455  WBC 8.2 9.2 8.2  NEUTROABS 7.0  --  6.0  HGB 9.9* 10.3* 9.3*  HCT  31.2* 32.8* 28.7*  MCV 98.1 98.8 95.7  PLT 172 165 160   Basic Metabolic Panel: Recent Labs  Lab 02/08/23 1639 02/09/23 0222 02/10/23 0455  NA 142 139 134*  K 3.7 4.3 3.4*  CL 100 98 93*  CO2 27 27 27   GLUCOSE 102* 90 209*  BUN 15 14 16   CREATININE 1.49* 1.36* 1.40*  CALCIUM 8.8* 8.6* 8.1*  MG  --  1.5* 1.2*  PHOS  --  3.4 3.6   GFR: Estimated Creatinine Clearance: 59.3 mL/min (A) (by C-G formula based on SCr of 1.4 mg/dL (H)). Liver Function Tests: Recent Labs  Lab 02/08/23 1639 02/09/23 0222 02/10/23 0455 02/10/23 0500  AST 21 30  --  25  ALT 13 15  --  10  ALKPHOS 60 62  --  50  BILITOT 1.3* 1.8*  --  1.3*  PROT 7.2 7.1  --  6.2*  ALBUMIN  3.3* 3.1* 2.6* 2.6*   No results for input(s): LIPASE, AMYLASE in the last 168 hours. Recent Labs  Lab 02/08/23 1921  AMMONIA 37*   Coagulation Profile: Recent Labs  Lab 02/08/23 1639  INR 1.3*   Cardiac Enzymes: Recent Labs  Lab 02/09/23 0222  CKTOTAL 37*   BNP (last 3 results) No results for input(s): PROBNP in the last  8760 hours. HbA1C: No results for input(s): HGBA1C in the last 72 hours. CBG: No results for input(s): GLUCAP in the last 168 hours. Lipid Profile: No results for input(s): CHOL, HDL, LDLCALC, TRIG, CHOLHDL, LDLDIRECT in the last 72 hours. Thyroid Function Tests: Recent Labs    02/09/23 0222  TSH 2.743   Anemia Panel: Recent Labs    02/09/23 0222  VITAMINB12 432  FOLATE 32.6  FERRITIN 305  TIBC 237*  IRON  18*  RETICCTPCT 1.0   Sepsis Labs: Recent Labs  Lab 02/08/23 1649 02/08/23 1855 02/09/23 0222  PROCALCITON  --   --  0.35  LATICACIDVEN 1.4 1.1  --     Recent Results (from the past 240 hours)  Resp panel by RT-PCR (RSV, Flu A&B, Covid) Anterior Nasal Swab     Status: None   Collection Time: 02/08/23  4:39 PM   Specimen: Anterior Nasal Swab  Result Value Ref Range Status   SARS Coronavirus 2 by RT PCR NEGATIVE NEGATIVE Final   Influenza A by PCR NEGATIVE NEGATIVE Final   Influenza B by PCR NEGATIVE NEGATIVE Final    Comment: (NOTE) The Xpert Xpress SARS-CoV-2/FLU/RSV plus assay is intended as an aid in the diagnosis of influenza from Nasopharyngeal swab specimens and should not be used as a sole basis for treatment. Nasal washings and aspirates are unacceptable for Xpert Xpress SARS-CoV-2/FLU/RSV testing.  Fact Sheet for Patients: bloggercourse.com  Fact Sheet for Healthcare Providers: seriousbroker.it  This test is not yet approved or cleared by the [place]  FDA and has been authorized for detection and/or diagnosis of SARS-CoV-2 by FDA under an Emergency Use Authorization (EUA). This EUA will remain in effect (meaning this test can be used) for the duration of the COVID-19 declaration under Section 564(b)(1) of the Act, 21 U.S.C. section 360bbb-3(b)(1), unless the authorization is terminated or revoked.     Resp Syncytial Virus by PCR NEGATIVE NEGATIVE Final    Comment:  (NOTE) Fact Sheet for Patients: bloggercourse.com  Fact Sheet for Healthcare Providers: seriousbroker.it  This test is not yet approved or cleared by the [place]  FDA and has been authorized for detection and/or diagnosis of SARS-CoV-2 by FDA under  an Emergency Use Authorization (EUA). This EUA will remain in effect (meaning this test can be used) for the duration of the COVID-19 declaration under Section 564(b)(1) of the Act, 21 U.S.C. section 360bbb-3(b)(1), unless the authorization is terminated or revoked.  Performed at [facility], [address] [place], [place] [address]   Culture, blood (routine x 2)     Status: None (Preliminary result)   Collection Time: 02/08/23  4:39 PM   Specimen: BLOOD LEFT HAND  Result Value Ref Range Status   Specimen Description BLOOD LEFT HAND  Final   Special Requests   Final    BOTTLES DRAWN AEROBIC AND ANAEROBIC Blood Culture results may not be optimal due to an inadequate volume of blood received in culture bottles   Culture   Final    NO GROWTH 2 DAYS Performed at [facility], [address] [place], [place] [address]    Report Status PENDING  Incomplete  Culture, blood (routine x 2)     Status: None (Preliminary result)   Collection Time: 02/08/23  4:39 PM   Specimen: BLOOD LEFT FOREARM  Result Value Ref Range Status   Specimen Description BLOOD LEFT FOREARM  Final   Special Requests   Final    BOTTLES DRAWN AEROBIC AND ANAEROBIC Blood Culture results may not be optimal due to an inadequate volume of blood received in culture bottles   Culture   Final    NO GROWTH 2 DAYS Performed at [facility], [address], [place], [place] [address]    Report Status PENDING  Incomplete  Respiratory (~20 pathogens) panel by PCR     Status: Abnormal   Collection Time: 02/08/23  4:39 PM   Specimen: Nasopharyngeal Swab; Respiratory  Result Value Ref Range Status    Adenovirus NOT DETECTED NOT DETECTED Final   Coronavirus 229E NOT DETECTED NOT DETECTED Final    Comment: (NOTE) The Coronavirus on the Respiratory Panel, DOES NOT test for the novel  Coronavirus (2019 nCoV)    Coronavirus HKU1 NOT DETECTED NOT DETECTED Final   Coronavirus NL63 NOT DETECTED NOT DETECTED Final   Coronavirus OC43 DETECTED (A) NOT DETECTED Final   Metapneumovirus NOT DETECTED NOT DETECTED Final   Rhinovirus / Enterovirus NOT DETECTED NOT DETECTED Final   Influenza A NOT DETECTED NOT DETECTED Final   Influenza B NOT DETECTED NOT DETECTED Final   Parainfluenza Virus 1 NOT DETECTED NOT DETECTED Final   Parainfluenza Virus 2 NOT DETECTED NOT DETECTED Final   Parainfluenza Virus 3 NOT DETECTED NOT DETECTED Final   Parainfluenza Virus 4 NOT DETECTED NOT DETECTED Final   Respiratory Syncytial Virus NOT DETECTED NOT DETECTED Final   Bordetella pertussis NOT DETECTED NOT DETECTED Final   Bordetella Parapertussis NOT DETECTED NOT DETECTED Final   Chlamydophila pneumoniae NOT DETECTED NOT DETECTED Final   Mycoplasma pneumoniae NOT DETECTED NOT DETECTED Final    Comment: Performed at [facility], [address], [place], [place] [address]    Radiology Studies: ECHOCARDIOGRAM COMPLETE Result Date: 02/09/2023    ECHOCARDIOGRAM REPORT   Patient Name:   GRAYLON AMORY Date of Exam: 02/09/2023 Medical Rec #:  [id number]        Height:       70.0 in Accession #:    [id number]       Weight:       198.0 lb Date of Birth:  21-Jul-1960        BSA:  2.078 m Patient Age:    63 years         BP:           113/69 mmHg Patient Gender: M                HR:           120 bpm. Exam Location:  Inpatient Procedure: 2D Echo, Cardiac Doppler, Color Doppler and Intracardiac            Opacification Agent Indications:    Congestive Heart Failure  History:        Patient has prior history of Echocardiogram examinations, most                 recent 12/31/2022. Risk Factors:Hypertension and Former  Smoker.  Sonographer:    Ozell Free Referring Phys: [id number] ANASTASSIA DOUTOVA  Sonographer Comments: Technically challenging study due to limited acoustic windows. Image acquisition challenging due to patient body habitus. IMPRESSIONS  1. Left ventricular ejection fraction, by estimation, is 30 to 35%. The left ventricle has moderately decreased function. The left ventricle demonstrates global hypokinesis. Left ventricular diastolic function could not be evaluated.  2. Right ventricular systolic function is moderately reduced. The right ventricular size is moderately enlarged. Tricuspid regurgitation signal is inadequate for assessing PA pressure.  3. Left atrial size was moderately dilated.  4. Right atrial size was moderately dilated.  5. The mitral valve is normal in structure. Mild to moderate mitral valve regurgitation. No evidence of mitral stenosis.  6. The aortic valve is tricuspid. Aortic valve regurgitation is not visualized. No aortic stenosis is present. Comparison(s): Prior images reviewed side by side. The left ventricular function is worsened. FINDINGS  Left Ventricle: Mobile echodensity at the apex appears to be a false tendon. Left ventricular ejection fraction, by estimation, is 30 to 35%. The left ventricle has moderately decreased function. The left ventricle demonstrates global hypokinesis. Definity  contrast agent was given IV to delineate the left ventricular endocardial borders. The left ventricular internal cavity size was normal in size. There is borderline concentric left ventricular hypertrophy. Left ventricular diastolic function could not be evaluated due to atrial fibrillation. Left ventricular diastolic function could not be evaluated. Right Ventricle: The right ventricular size is moderately enlarged. Right vetricular wall thickness was not well visualized. Right ventricular systolic function is moderately reduced. Tricuspid regurgitation signal is inadequate for assessing PA pressure.  Left Atrium: Left atrial size was moderately dilated. Right Atrium: Right atrial size was moderately dilated. Pericardium: There is no evidence of pericardial effusion. Mitral Valve: The mitral valve is normal in structure. Mild to moderate mitral valve regurgitation, with centrally-directed jet. No evidence of mitral valve stenosis. MV peak gradient, 6.9 mmHg. The mean mitral valve gradient is 3.7 mmHg. Tricuspid Valve: The tricuspid valve is grossly normal. Tricuspid valve regurgitation is not demonstrated. Aortic Valve: The aortic valve is tricuspid. Aortic valve regurgitation is not visualized. No aortic stenosis is present. Aortic valve mean gradient measures 3.0 mmHg. Aortic valve peak gradient measures 4.6 mmHg. Aortic valve area, by VTI measures 2.04 cm. Pulmonic Valve: The pulmonic valve was not well visualized. Pulmonic valve regurgitation is not visualized. No evidence of pulmonic stenosis. Aorta: The aortic root is normal in size and structure. IAS/Shunts: The interatrial septum was not well visualized.  LEFT VENTRICLE PLAX 2D LVIDd:         4.30 cm     Diastology LVIDs:         3.60 cm  LV e' medial:    5.51 cm/s LV PW:         1.10 cm     LV E/e' medial:  23.1 LV IVS:        1.20 cm     LV e' lateral:   9.29 cm/s LVOT diam:     1.90 cm     LV E/e' lateral: 13.7 LV SV:         29 LV SV Index:   14 LVOT Area:     2.84 cm  LV Volumes (MOD) LV vol d, MOD A2C: 98.5 ml LV vol d, MOD A4C: 70.6 ml LV vol s, MOD A2C: 62.5 ml LV vol s, MOD A4C: 51.3 ml LV SV MOD A2C:     36.0 ml LV SV MOD A4C:     70.6 ml LV SV MOD BP:      26.5 ml RIGHT VENTRICLE            IVC RV Basal diam:  4.50 cm    IVC diam: 2.30 cm RV S prime:     8.37 cm/s LEFT ATRIUM              Index        RIGHT ATRIUM           Index LA diam:        4.20 cm  2.02 cm/m   RA Area:     22.20 cm LA Vol (A2C):   117.0 ml 56.29 ml/m  RA Volume:   70.70 ml  34.02 ml/m LA Vol (A4C):   55.2 ml  26.56 ml/m LA Biplane Vol: 81.1 ml  39.02 ml/m   AORTIC VALVE AV Area (Vmax):    1.89 cm AV Area (Vmean):   1.80 cm AV Area (VTI):     2.04 cm AV Vmax:           107.43 cm/s AV Vmean:          79.233 cm/s AV VTI:            0.141 m AV Peak Grad:      4.6 mmHg AV Mean Grad:      3.0 mmHg LVOT Vmax:         71.57 cm/s LVOT Vmean:        50.200 cm/s LVOT VTI:          0.102 m LVOT/AV VTI ratio: 0.72  AORTA Ao Root diam: 3.50 cm MITRAL VALVE MV Area (PHT): 5.32 cm     SHUNTS MV Area VTI:   1.80 cm     Systemic VTI:  0.10 m MV Peak grad:  6.9 mmHg     Systemic Diam: 1.90 cm MV Mean grad:  3.7 mmHg MV Vmax:       1.32 m/s MV Vmean:      88.5 cm/s MV Decel Time: 143 msec MR Peak grad: 81.5 mmHg MR Vmax:      451.33 cm/s MV E velocity: 127.33 cm/s Jerel Balding MD Electronically signed by Jerel Balding MD Signature Date/Time: 02/09/2023/10:31:05 AM    Final    US  Abdomen Complete Result Date: 02/08/2023 CLINICAL DATA:  [id number] with acute kidney injury. [id number].  Anasarca. History of liver disease by prior abdominal ultrasound order. EXAM: ABDOMEN ULTRASOUND COMPLETE COMPARISON:  Right upper quadrant ultrasound 01/04/2023 FINDINGS: Gallbladder: There is a mixture of sludge and tiny stones layering within the gallbladder, as before. There is thickening of the free wall which measures 6  mm today, previously 5.4 mm which was not associated with a positive sonographic Murphy's sign. This could be due to chronic liver disease, passive wall congestion, reactive wall thickening or chronic cholecystitis. Common bile duct: Diameter: 3.4 mm. No intrahepatic biliary dilatation. Liver: No focal lesion identified. Within normal limits in parenchymal echogenicity. The liver is mildly prominent measuring 20 cm in length. Some images suggest slight surface lobulation in the left lobe which may be seen with cirrhosis. Portal vein is patent on color Doppler imaging with normal direction of blood flow towards the liver. The hepatic portal main vein is mildly prominent at 15 mm. IVC:  Widely patent. Pancreas: Only the neck and body sections are visible. The head and tail, uncinate process obscured by bowel gas. The visualized portions unremarkable. Spleen: Obscured by bowel gas.  Not seen. Right Kidney: Length: 8.9 cm. Echogenicity of the renal cortex is mildly increased relative to liver. No mass, stones or hydronephrosis visualized. Left Kidney: Length: 9.9 cm. Echogenicity within normal limits. No mass, stones or hydronephrosis visualized. Abdominal aorta: No aneurysm visualized. Other findings: Mild perihepatic ascites, slightly greater than previously. IMPRESSION: 1. Mild perihepatic ascites, slightly greater than previously. 2. Mildly prominent liver with slight surface lobulation in the left lobe which may be seen with cirrhosis. 3. Mildly prominent hepatic portal main vein at 15 mm. No portal flow reversal. 4. Sludge and tiny stones in the gallbladder with thickening of the free wall which was not associated with a positive sonographic Murphy's sign. This could be due to chronic liver disease, passive wall congestion, reactive wall thickening or chronic cholecystitis. Similar wall thickening was noted on 01/04/2023. 5. Mildly increased echogenicity of the right renal cortex relative to liver. This can be seen with medical renal disease. Left kidney is sonographically unremarkable. 6. Spleen not seen. 7. Limited visualization of the pancreas, unremarkable where visible. Electronically Signed   By: Francis Quam M.D.   On: 02/08/2023 23:16   Scheduled Meds:  amiodarone   150 mg Intravenous Once   apixaban   5 mg Oral BID   [START ON 02/11/2023] feeding supplement  237 mL Oral BID BM   furosemide   40 mg Intravenous BID   guaiFENesin   600 mg Oral BID   metoprolol  tartrate  25 mg Oral BID   midodrine   15 mg Oral TID WC   nicotine   21 mg Transdermal Daily   pantoprazole   40 mg Oral BID   potassium chloride   40 mEq Oral BID   sodium chloride  flush  3 mL Intravenous Q12H   thiamine    100 mg Oral Daily   Continuous Infusions:  amiodarone  30 mg/hr (02/10/23 1221)    LOS: 1 day   Alejandro Lazarus Marker, DO [facility] Available via Epic secure chat 7am-7pm After these hours, please refer to coverage provider listed on [email or web address] 02/10/2023, 5:48 PM

## 2023-02-10 NOTE — Plan of Care (Signed)

## 2023-02-10 NOTE — [place] FL2 (Signed)
 [place]  MEDICAID FL2 LEVEL OF CARE FORM     IDENTIFICATION  Patient Name: Justin Fleming Birthdate: [birth year] Sex: male Admission Date (Current Location): 02/08/2023  [facility] and [place] Number:  Producer, Television/film/video and Address:  [facility] [place], [address], [place], [place] [address]      Provider Number: [id number]  Attending Physician Name and Address:  Sherrill Alejandro Donovan, DO  Relative Name and Phone Number:       Current Level of Care: Hospital Recommended Level of Care: Skilled Nursing Facility Prior Approval Number:    Date Approved/Denied:   PASRR Number: [id number]  Discharge Plan: SNF    Current Diagnoses: Patient Active Problem List   Diagnosis Date Noted   Atrial fibrillation with RVR (HCC) 02/08/2023   Acute on chronic diastolic CHF (congestive heart failure) (HCC) 02/08/2023   Alcohol induced fatty liver 02/08/2023   URI (upper respiratory infection) 02/08/2023   Severe sepsis (HCC) 01/16/2023   Hypokalemia 01/15/2023   Acute idiopathic gout 01/14/2023   Acute heart failure with preserved ejection fraction (HFpEF) (HCC) 01/13/2023   Elevated troponin 01/07/2023   Vitamin B12 deficiency 01/07/2023   Chronic anemia 01/04/2023   Persistent atrial fibrillation (HCC) 01/01/2023   Anemia due to acute blood loss 01/01/2023   Alcohol intoxication (HCC) 12/30/2022   Atrial fibrillation with rapid ventricular response (HCC) 12/30/2022   Lactic acidosis 12/30/2022   MDD (major depressive disorder) (HCC) 09/12/2014   Alcohol abuse 09/05/2014   Hepatomegaly 05/23/2014   Insomnia 07/12/2013   GAD (generalized anxiety disorder) 06/23/2013   Essential hypertension, benign 06/23/2013   Tremor 06/23/2013   GERD (gastroesophageal reflux disease) 06/23/2013   Tobacco use disorder 06/23/2013    Orientation RESPIRATION BLADDER Height & Weight     Self, Time, Situation, Place  O2 (Nasal Cannula 2 liters) Incontinent Weight: 198 lb  (89.8 kg) Height:  5' 10 (177.8 cm)  BEHAVIORAL SYMPTOMS/MOOD NEUROLOGICAL BOWEL NUTRITION STATUS      Continent Diet (Please see discharge summary)  AMBULATORY STATUS COMMUNICATION OF NEEDS Skin   Extensive Assist Verbally Other (Comment) (scratch marks,arm,L,Wound/Incision LDAs)                       Personal Care Assistance Level of Assistance  Bathing, Feeding, Dressing Bathing Assistance: Maximum assistance Feeding assistance: Independent Dressing Assistance: Maximum assistance     Functional Limitations Info  Sight, Hearing, Speech Sight Info:  Financial Trader) Hearing Info: Adequate Speech Info: Adequate    SPECIAL CARE FACTORS FREQUENCY  PT (By licensed PT), OT (By licensed OT)     PT Frequency: 5x min weekly OT Frequency: 5x min weekly            Contractures Contractures Info: Not present    Additional Factors Info  Code Status, Allergies, Isolation Precautions Code Status Info: FULL Allergies Info: Chlorhexidine  Gluconate     Isolation Precautions Info: Droplet     Current Medications (02/10/2023):  This is the current hospital active medication list Current Facility-Administered Medications  Medication Dose Route Frequency Provider Last Rate Last Admin   acetaminophen  (TYLENOL ) tablet 650 mg  650 mg Oral Q6H PRN Doutova, Anastassia, MD   650 mg at 02/10/23 9040   Or   acetaminophen  (TYLENOL ) suppository 650 mg  650 mg Rectal Q6H PRN Doutova, Anastassia, MD       amiodarone  (NEXTERONE ) 1.8 mg/mL load via infusion 150 mg  150 mg Intravenous Once Sundil, Subrina, MD  Followed by   amiodarone  (NEXTERONE  PREMIX) 360-4.14 MG/200ML-% (1.8 mg/mL) IV infusion  30 mg/hr Intravenous Continuous Sundil, Subrina, MD 16.67 mL/hr at 02/10/23 1221 30 mg/hr at 02/10/23 1221   apixaban  (ELIQUIS ) tablet 5 mg  5 mg Oral BID Doutova, Anastassia, MD   5 mg at 02/10/23 0940   furosemide  (LASIX ) injection 40 mg  40 mg Intravenous BID Doutova, Anastassia, MD   40 mg at  02/10/23 9173   guaiFENesin  (MUCINEX ) 12 hr tablet 600 mg  600 mg Oral BID Doutova, Anastassia, MD   600 mg at 02/10/23 9043   HYDROcodone -acetaminophen  (NORCO/VICODIN) 5-325 MG per tablet 1-2 tablet  1-2 tablet Oral Q4H PRN Doutova, Anastassia, MD       metoprolol  tartrate (LOPRESSOR ) tablet 25 mg  25 mg Oral BID Mona Vinie BROCKS, MD   25 mg at 02/10/23 0940   midodrine  (PROAMATINE ) tablet 15 mg  15 mg Oral TID WC Doutova, Anastassia, MD   15 mg at 02/10/23 1221   nicotine  (NICODERM CQ  - dosed in mg/24 hours) patch 21 mg  21 mg Transdermal Daily Doutova, Anastassia, MD       ondansetron  (ZOFRAN ) tablet 4 mg  4 mg Oral Q6H PRN Doutova, Anastassia, MD       Or   ondansetron  (ZOFRAN ) injection 4 mg  4 mg Intravenous Q6H PRN Doutova, Anastassia, MD       pantoprazole  (PROTONIX ) EC tablet 40 mg  40 mg Oral BID Doutova, Anastassia, MD   40 mg at 02/10/23 0940   potassium chloride  SA (KLOR-CON  M) CR tablet 40 mEq  40 mEq Oral BID Sheikh, Omair Latif, DO   40 mEq at 02/10/23 0940   sodium chloride  flush (NS) 0.9 % injection 3 mL  3 mL Intravenous Q12H Doutova, Anastassia, MD   3 mL at 02/09/23 2138   sodium chloride  flush (NS) 0.9 % injection 3 mL  3 mL Intravenous PRN Doutova, Anastassia, MD       thiamine  (VITAMIN B1) tablet 100 mg  100 mg Oral Daily Doutova, Anastassia, MD   100 mg at 02/10/23 0940     Discharge Medications: Please see discharge summary for a list of discharge medications.  Relevant Imaging Results:  Relevant Lab Results:   Additional Information [id number]  Isaiah Public, LCSWA

## 2023-02-10 NOTE — ED Notes (Signed)
Moved to hospital bed for comfort.

## 2023-02-10 NOTE — Hospital Course (Addendum)
 The patient is a 63 year old AAM with a past medical history significant for but not limited to atrial fibrillation, CHF, gout, GERD, history of alcohol abuse and other comorbidities who continues to drink significant alcohol.  Is admitted with A-fib with RVR tachypnea and worsening edema and echocardiogram revealed left ventricular ejection fraction of 30 to 35%, global hypokinesis of left ventricle and reduced right ventricular function with moderate enlargement.  He also was noted to have worsening renal function.  Currently is getting diuresis and cardiology's been consulted and he is on amiodarone  drip for his uncontrolled heart rates with A-fib with RVR.  Respiratory virus panel was positive for coronavirus OC 43.  He is being diuresed and underwent TEE/DCCV today and has converted to NSR.   He was currently getting diuresis per Cardiology and had PICC line placed. Co-Ox came back at 84 and the medical Cardiolog team discussed with advanced heart failure team who recommended starting milrinone  and getting CVP's.  Now he is getting diuresis IV Lasix  80 mg twice daily and is on the low normal drip.  Because he is on neurologic his p.o. amiodarone  was changed back to an amiodarone  drip.  Cardiology recommends continuing diuresis and following up electrolytes and once his fluid status is optimized they are recommending that he can likely wean off of milrinone  and transition to divided doses of Lasix  and oral Amiodarone .  Assessment and Plan:  Atrial Fibrillation with RVR [facility]) s/p TEE/DCCV and conversion to NSR -Cardiology input is appreciated  -Patient was on amiodarone  drip and transition to oral amiodarone  as below however it has been changed patient back to amiodarone  drip given that he is being started on milrinone  -Cardiology team plans to add beta-blockers and he is now on metoprolol  tartrate 25 mg p.o. twice daily and cardiology recommends transitioning to Toprol -XL prior to discharge given his  reduced EF -Continue anticoagulation with Apixaban  for now. -TSH was 2.743 -Echocardiogram reveals new cardiomyopathy, probably tachycardia induced. -Cardiology took patient for TEE/DCCV 02/11/23 and it was done; Findings on TEE showed Moderate reduced LV function, Mild MR,+PFO,Negative study for LAA thrombusand he is now in Sinus Rhythm -He was on a p.o. Taper of Amiodarone  200 mg twice daily for next week and then he will go to 200 mg daily after however he was changed back to IV amiodarone  given that he is being switched to IV Amiodarone  given that the milrinone  drip is proarrhythmic.  Per cardiology recommending switching back to oral once he is off of Milrinone    Acute on Chronic Systolic CHF/Cardiomyopathy: -Cardiology team is directing care given that there is concern for low output heart failure.. -He is status post TEE/DCCV -Continues to be markedly volume overloaded and BNP was elevated 570 on admission and is now 964.6 -> 943.3 -Echocardiogram done and showed an LVEF of 30 to 35% with global hypokinesis, moderately reduced right ventricular function, moderate biatrial enlargement and mild to moderate MR; this EF is down from 50 to 55% back in December Intake/Output Summary (Last 24 hours) at 02/16/2023 1637 Last data filed at 02/16/2023 1513 Gross per 24 hour  Intake 1784.05 ml  Output 1800 ml  Net -15.95 ml   -He continues to be volume overloaded so cardiology increased his Lasix  dose to 80 mg IV  and placed a PICC line for Co-ox; Given his Low Co-ox (54.5) he was started on a Milrinone  gtt; Now Co-Ox is 63.9 today -Continue further care per Cardiology and were are recommending continuing the IV Lasix  80 mg  twice daily with potassium supplementation and Midodrine  to support blood pressures and feel that his GDMT is limited otherwise.; Today his Diuresis was dropped to just IV 80 mg Daily -Cardiology recommends attempting to wean oxygen and stopping milrinone  in next 24 to 48 hours and  continue daily Co-Ox while PICC is in place and cardiology is proposing doing a right heart cath to assess his volume status and left heart cath to have a proper ischemic workup however this has been deferred given his fever and worsening leukocytosis  Alcohol Abuse, Continuous -Counseled to quit alcohol. -CIWA Protocol was initiated and appears to be out of withdrawals -Low-dose to try Librium  25 Mg p.o. every 6 hourly x 4 doses and reviewed.   -Patient had remained significantly tachycardic, despite amiodarone  drip but HR's are improving and he is now in NSR and not Tachycardic at all   Acute Respiratory Failure with Hypoxia -In the setting of Coronavirus and CHF -SpO2: 96 % O2 Flow Rate (L/min): 3 L/min -Continue Diuresis per Cards -C/w Abx and Nebs as below -Continuous Pulse Oximetry and Maintain O2 Sats >90% -Continue supplemental oxygen via nasal cannula wean O2 as tolerated -Repeat chest x-ray in a.m. and will need an Ambulatory Home O2 screen prior to discharge  Chronic Normocytic Anemia, stable  -Likely Multifactorial. Hgb/Hct Trend: Recent Labs  Lab 02/10/23 0455 02/11/23 0439 02/12/23 0931 02/13/23 0537 02/14/23 0622 02/15/23 0409 02/16/23 0420  HGB 9.3* 9.1* 8.9* 8.1* 8.3* 8.3* 8.1*  HCT 28.7* 28.4* 27.9* 24.8* 25.5* 25.7* 25.4*  MCV 95.7 95.3 94.9 94.7 93.8 93.5 93.4  -Obtained Anemia Panel with iron  level of 18, UIBC 219, TIBC of 237, saturation ratios of 8%, ferritin low 3-5, folate level 32.6 and a vitamin B12 432 -Transfuse for Hg <7 ,rapidly dropping or  if symptomatic -Continue to monitor for signs and symptoms bleeding; no overt bleeding noted   Hypokalemia -Patient's K+ Level Trend: Recent Labs  Lab 02/10/23 0455 02/11/23 0439 02/12/23 0930 02/13/23 0537 02/14/23 0622 02/15/23 0409 02/16/23 0420  K 3.4* 3.8 3.8 3.2* 3.8 4.0 3.6  -Replete with po Kcl 40 mEQ x1 again -Continue to Monitor and Replete as Necessary -Repeat CMP in the AM    Hypomagnesemia -Patient's Mag Level Trend: Recent Labs  Lab 02/10/23 0455 02/11/23 0439 02/12/23 0931 02/13/23 0537 02/14/23 0622 02/15/23 0409 02/16/23 0420  MG 1.2* 1.9 2.0 1.6* 1.7 1.7 2.2  -Continue to Monitor and Replete as Necessary -Repeat Mag in the AM   AKI vs CKD stage IIIa -BUN/Cr Trend: Recent Labs  Lab 02/10/23 0455 02/11/23 0439 02/12/23 0930 02/13/23 0537 02/14/23 0622 02/15/23 0409 02/16/23 0420  BUN 16 19 24* 23 21 19 21   CREATININE 1.40* 1.47* 1.46* 1.41* 1.30* 1.61* 1.35*  -Avoid Nephrotoxic Medications, Contrast Dyes, Hypotension and Dehydration to Ensure Adequate Renal Perfusion and will need to Renally Adjust Meds -Continue to Monitor and Trend Renal Function carefully and repeat CMP in the AM   Hyponatremia, fluctuating  -Mild and likely in the setting of volume overload. Na+ Trend: Recent Labs  Lab 02/10/23 0455 02/11/23 0439 02/12/23 0930 02/13/23 0537 02/14/23 0622 02/15/23 0409 02/16/23 0420  NA 134* 135 136 137 135 133* 136  -Continue monitor trend and repeat CMP in a.m.   Elevated Troponin -In the setting of tachycardia -Troponin went from 68 -> 67 -Continue to monitor  -Cardiology is following   Alcohol Induced Fatty Liver -Refer to GI team on discharge.   URI (Upper Respiratory Infection) with Coronavirus (Not  COVID) -Respiratory panel significant for Coronavirus OC43.   -Blood cultures pending -Procalcitonin is 0.35.  -Patient is still intermittently been febrile and had SIRS criteria; spiked a temperature of 101.5 the night before last and now T Max was 101.7  -Fortunately he has no leukocytosis but WBC is trending upward slowly -Added IV Ceftriaxone  and Azithromycin  to cover superimposed Bacterial PNA; Will change Azithromycin  to Doxycyline to avoid qT prolongation  -LA Trend: Recent Labs  Lab 02/08/23 1649 02/08/23 1855 02/12/23 0931 02/12/23 1223 02/16/23 0935 02/16/23 1200  LATICACIDVEN 1.4 1.1 1.6 1.0 0.9  1.6  -U/A Negative and only showing Bacteuria -WBC Trend: Recent Labs  Lab 02/10/23 0455 02/11/23 0439 02/12/23 0931 02/13/23 0537 02/14/23 0622 02/15/23 0409 02/16/23 0420  WBC 8.2 7.7 7.0 7.7 8.6 10.3 12.3*  -CXR done yesterday today showed Support apparatus, as above.The appearance of the chest is concerning for multilobar bilateral  pneumonia, most confluent in the left mid to lower lung. Small bilateral pleural effusions. Mild cardiomegaly.' -CXR done today and showed Cardiac shadow is enlarged but stable. Right PICC is again seen and stable. Improved aeration is noted bilaterally although persistent basilar opacities are seen. No bony abnormality is noted. -Increase Guaifenesin  to 1200 mg po BID, add Xopenex  q6h, and Atrovent  q6hprn -Continue with supportive care and repeat CXR in the AM  Hyperbilirubinemia, stable  -Bilirubin Trend: Recent Labs  Lab 02/10/23 0500 02/11/23 0439 02/12/23 0930 02/13/23 0537 02/14/23 0622 02/15/23 0409 02/16/23 0420  BILITOT 1.3* 1.1 1.7* 1.3* 1.3* 1.3* 1.1  -Continue to Monitor and Trend and Repeat CMP in the AM  Tobacco Abuse -Smoking Cessation counseling given -Discontinue Nicotine  21 mg TD Patch now as patient requesting no longer wanting it  Hypoalbuminemia -Patient's Albumin  Trending from 2.1-2.6 -Continue to Monitor and Trend and repeat CMP in the AM  Overweight -Complicates overall prognosis and care -Estimated body mass index is 26.41 kg/m as calculated from the following:   Height as of this encounter: 5' 10 (1.778 m).   Weight as of this encounter: 83.5 kg.  -Weight Loss and Dietary Counseling given

## 2023-02-10 NOTE — H&P (View-Only) (Signed)
 Rounding Note    Patient Name: Justin Fleming Date of Encounter: 02/10/2023  [facility] Cardiologist: Jayson Sierras, MD   Subjective   Patient's fevers spiked at 102.9 last night. He denies any shortness. He remains markedly volume overloaded. He I still in atrial fibrillation. Rates increased to the 140s last night but currently in the low 100s. No chest pain or palpitations.   Inpatient Medications    Scheduled Meds:  amiodarone   150 mg Intravenous Once   apixaban   5 mg Oral BID   furosemide   40 mg Intravenous BID   guaiFENesin   600 mg Oral BID   metoprolol  tartrate  25 mg Oral BID   midodrine   15 mg Oral TID WC   nicotine   21 mg Transdermal Daily   pantoprazole   40 mg Oral BID   potassium chloride   40 mEq Oral BID   sodium chloride  flush  3 mL Intravenous Q12H   thiamine   100 mg Oral Daily   Continuous Infusions:  amiodarone  30 mg/hr (02/10/23 0814)   magnesium  sulfate bolus IVPB     PRN Meds: acetaminophen  **OR** acetaminophen , HYDROcodone -acetaminophen , ondansetron  **OR** ondansetron  (ZOFRAN ) IV, sodium chloride  flush   Vital Signs    Vitals:   02/10/23 0613 02/10/23 0700 02/10/23 0800 02/10/23 0900  BP:  100/83 95/75 (!) 126/104  Pulse:  78 90 98  Resp:  19 14 17   Temp: 98.2 F (36.8 C)     TempSrc: Oral     SpO2:  100% 100% 100%  Weight:      Height:        Intake/Output Summary (Last 24 hours) at 02/10/2023 0935 Last data filed at 02/09/2023 1202 Gross per 24 hour  Intake --  Output 900 ml  Net -900 ml      02/08/2023    4:19 PM 02/08/2023    2:33 PM 01/26/2023    1:32 PM  Last 3 Weights  Weight (lbs) 198 lb 198 lb 207 lb 6.4 oz  Weight (kg) 89.812 kg 89.812 kg 94.076 kg      Telemetry    Atrial fibrillation. Rates currently in the 90s to low 100s. - Personally Reviewed  ECG    No new ECG tracing. - Personally Reviewed  Physical Exam   GEN: African-American male in no acute distress.   Neck: JVD elevated. Cardiac:  Borderline tachycardic with irregularly irregular rhythm. No murmurs, rubs, or gallops.  Respiratory: No increased work of breathing. Decreased breath sounds in bilateral bases. No significant crackles. GI: Soft and non-tender but distended. MS: 3+ pitting edema o bilateral lower extremities. Neuro:  No focal deficits. Psych: Normal affect. Responds appropriately.  Labs    High Sensitivity Troponin:   Recent Labs  Lab 02/08/23 1639 02/08/23 1844 02/08/23 2304 02/09/23 0222  TROPONINIHS 49* 53* 68* 67*     Chemistry Recent Labs  Lab 02/08/23 1639 02/09/23 0222 02/10/23 0455  NA 142 139 134*  K 3.7 4.3 3.4*  CL 100 98 93*  CO2 27 27 27   GLUCOSE 102* 90 209*  BUN 15 14 16   CREATININE 1.49* 1.36* 1.40*  CALCIUM 8.8* 8.6* 8.1*  MG  --  1.5* 1.2*  PROT 7.2 7.1  --   ALBUMIN  3.3* 3.1* 2.6*  AST 21 30  --   ALT 13 15  --   ALKPHOS 60 62  --   BILITOT 1.3* 1.8*  --   GFRNONAA 52* 58* 56*  ANIONGAP 15 14 14     Lipids  No results for input(s): CHOL, TRIG, HDL, LABVLDL, LDLCALC, CHOLHDL in the last 168 hours.  Hematology Recent Labs  Lab 02/08/23 1639 02/09/23 0222 02/10/23 0455  WBC 8.2 9.2 8.2  RBC 3.18* 3.32*  3.35* 3.00*  HGB 9.9* 10.3* 9.3*  HCT 31.2* 32.8* 28.7*  MCV 98.1 98.8 95.7  MCH 31.1 31.0 31.0  MCHC 31.7 31.4 32.4  RDW 19.0* 19.4* 18.9*  PLT 172 165 160   Thyroid  Recent Labs  Lab 02/09/23 0222  TSH 2.743    BNP Recent Labs  Lab 02/08/23 1639  BNP 570.9*    DDimer No results for input(s): DDIMER in the last 168 hours.   Radiology    ECHOCARDIOGRAM COMPLETE Result Date: 02/09/2023    ECHOCARDIOGRAM REPORT   Patient Name:   ANICETO KYSER Date of Exam: 02/09/2023 Medical Rec #:  [id number]        Height:       70.0 in Accession #:    [id number]       Weight:       198.0 lb Date of Birth:  10-15-1960        BSA:          2.078 m Patient Age:    63 years         BP:           113/69 mmHg Patient Gender: M                HR:            120 bpm. Exam Location:  Inpatient Procedure: 2D Echo, Cardiac Doppler, Color Doppler and Intracardiac            Opacification Agent Indications:    Congestive Heart Failure  History:        Patient has prior history of Echocardiogram examinations, most                 recent 12/31/2022. Risk Factors:Hypertension and Former Smoker.  Sonographer:    Ozell Free Referring Phys: [id number] ANASTASSIA DOUTOVA  Sonographer Comments: Technically challenging study due to limited acoustic windows. Image acquisition challenging due to patient body habitus. IMPRESSIONS  1. Left ventricular ejection fraction, by estimation, is 30 to 35%. The left ventricle has moderately decreased function. The left ventricle demonstrates global hypokinesis. Left ventricular diastolic function could not be evaluated.  2. Right ventricular systolic function is moderately reduced. The right ventricular size is moderately enlarged. Tricuspid regurgitation signal is inadequate for assessing PA pressure.  3. Left atrial size was moderately dilated.  4. Right atrial size was moderately dilated.  5. The mitral valve is normal in structure. Mild to moderate mitral valve regurgitation. No evidence of mitral stenosis.  6. The aortic valve is tricuspid. Aortic valve regurgitation is not visualized. No aortic stenosis is present. Comparison(s): Prior images reviewed side by side. The left ventricular function is worsened. FINDINGS  Left Ventricle: Mobile echodensity at the apex appears to be a false tendon. Left ventricular ejection fraction, by estimation, is 30 to 35%. The left ventricle has moderately decreased function. The left ventricle demonstrates global hypokinesis. Definity  contrast agent was given IV to delineate the left ventricular endocardial borders. The left ventricular internal cavity size was normal in size. There is borderline concentric left ventricular hypertrophy. Left ventricular diastolic function could not be evaluated due to atrial  fibrillation. Left ventricular diastolic function could not be evaluated. Right Ventricle: The right ventricular size is moderately enlarged. Right vetricular wall  thickness was not well visualized. Right ventricular systolic function is moderately reduced. Tricuspid regurgitation signal is inadequate for assessing PA pressure. Left Atrium: Left atrial size was moderately dilated. Right Atrium: Right atrial size was moderately dilated. Pericardium: There is no evidence of pericardial effusion. Mitral Valve: The mitral valve is normal in structure. Mild to moderate mitral valve regurgitation, with centrally-directed jet. No evidence of mitral valve stenosis. MV peak gradient, 6.9 mmHg. The mean mitral valve gradient is 3.7 mmHg. Tricuspid Valve: The tricuspid valve is grossly normal. Tricuspid valve regurgitation is not demonstrated. Aortic Valve: The aortic valve is tricuspid. Aortic valve regurgitation is not visualized. No aortic stenosis is present. Aortic valve mean gradient measures 3.0 mmHg. Aortic valve peak gradient measures 4.6 mmHg. Aortic valve area, by VTI measures 2.04 cm. Pulmonic Valve: The pulmonic valve was not well visualized. Pulmonic valve regurgitation is not visualized. No evidence of pulmonic stenosis. Aorta: The aortic root is normal in size and structure. IAS/Shunts: The interatrial septum was not well visualized.  LEFT VENTRICLE PLAX 2D LVIDd:         4.30 cm     Diastology LVIDs:         3.60 cm     LV e' medial:    5.51 cm/s LV PW:         1.10 cm     LV E/e' medial:  23.1 LV IVS:        1.20 cm     LV e' lateral:   9.29 cm/s LVOT diam:     1.90 cm     LV E/e' lateral: 13.7 LV SV:         29 LV SV Index:   14 LVOT Area:     2.84 cm  LV Volumes (MOD) LV vol d, MOD A2C: 98.5 ml LV vol d, MOD A4C: 70.6 ml LV vol s, MOD A2C: 62.5 ml LV vol s, MOD A4C: 51.3 ml LV SV MOD A2C:     36.0 ml LV SV MOD A4C:     70.6 ml LV SV MOD BP:      26.5 ml RIGHT VENTRICLE            IVC RV Basal diam:  4.50  cm    IVC diam: 2.30 cm RV S prime:     8.37 cm/s LEFT ATRIUM              Index        RIGHT ATRIUM           Index LA diam:        4.20 cm  2.02 cm/m   RA Area:     22.20 cm LA Vol (A2C):   117.0 ml 56.29 ml/m  RA Volume:   70.70 ml  34.02 ml/m LA Vol (A4C):   55.2 ml  26.56 ml/m LA Biplane Vol: 81.1 ml  39.02 ml/m  AORTIC VALVE AV Area (Vmax):    1.89 cm AV Area (Vmean):   1.80 cm AV Area (VTI):     2.04 cm AV Vmax:           107.43 cm/s AV Vmean:          79.233 cm/s AV VTI:            0.141 m AV Peak Grad:      4.6 mmHg AV Mean Grad:      3.0 mmHg LVOT Vmax:         71.57 cm/s  LVOT Vmean:        50.200 cm/s LVOT VTI:          0.102 m LVOT/AV VTI ratio: 0.72  AORTA Ao Root diam: 3.50 cm MITRAL VALVE MV Area (PHT): 5.32 cm     SHUNTS MV Area VTI:   1.80 cm     Systemic VTI:  0.10 m MV Peak grad:  6.9 mmHg     Systemic Diam: 1.90 cm MV Mean grad:  3.7 mmHg MV Vmax:       1.32 m/s MV Vmean:      88.5 cm/s MV Decel Time: 143 msec MR Peak grad: 81.5 mmHg MR Vmax:      451.33 cm/s MV E velocity: 127.33 cm/s Jerel Balding MD Electronically signed by Jerel Balding MD Signature Date/Time: 02/09/2023/10:31:05 AM    Final    US  Abdomen Complete Result Date: 02/08/2023 CLINICAL DATA:  [id number] with acute kidney injury. 13517.  Anasarca. History of liver disease by prior abdominal ultrasound order. EXAM: ABDOMEN ULTRASOUND COMPLETE COMPARISON:  Right upper quadrant ultrasound 01/04/2023 FINDINGS: Gallbladder: There is a mixture of sludge and tiny stones layering within the gallbladder, as before. There is thickening of the free wall which measures 6 mm today, previously 5.4 mm which was not associated with a positive sonographic Murphy's sign. This could be due to chronic liver disease, passive wall congestion, reactive wall thickening or chronic cholecystitis. Common bile duct: Diameter: 3.4 mm. No intrahepatic biliary dilatation. Liver: No focal lesion identified. Within normal limits in parenchymal  echogenicity. The liver is mildly prominent measuring 20 cm in length. Some images suggest slight surface lobulation in the left lobe which may be seen with cirrhosis. Portal vein is patent on color Doppler imaging with normal direction of blood flow towards the liver. The hepatic portal main vein is mildly prominent at 15 mm. IVC: Widely patent. Pancreas: Only the neck and body sections are visible. The head and tail, uncinate process obscured by bowel gas. The visualized portions unremarkable. Spleen: Obscured by bowel gas.  Not seen. Right Kidney: Length: 8.9 cm. Echogenicity of the renal cortex is mildly increased relative to liver. No mass, stones or hydronephrosis visualized. Left Kidney: Length: 9.9 cm. Echogenicity within normal limits. No mass, stones or hydronephrosis visualized. Abdominal aorta: No aneurysm visualized. Other findings: Mild perihepatic ascites, slightly greater than previously. IMPRESSION: 1. Mild perihepatic ascites, slightly greater than previously. 2. Mildly prominent liver with slight surface lobulation in the left lobe which may be seen with cirrhosis. 3. Mildly prominent hepatic portal main vein at 15 mm. No portal flow reversal. 4. Sludge and tiny stones in the gallbladder with thickening of the free wall which was not associated with a positive sonographic Murphy's sign. This could be due to chronic liver disease, passive wall congestion, reactive wall thickening or chronic cholecystitis. Similar wall thickening was noted on 01/04/2023. 5. Mildly increased echogenicity of the right renal cortex relative to liver. This can be seen with medical renal disease. Left kidney is sonographically unremarkable. 6. Spleen not seen. 7. Limited visualization of the pancreas, unremarkable where visible. Electronically Signed   By: Francis Quam M.D.   On: 02/08/2023 23:16   DG Chest Port 1 View Result Date: 02/08/2023 CLINICAL DATA:  Shortness of breath today EXAM: PORTABLE CHEST 1 VIEW  COMPARISON:  01/11/2023 FINDINGS: Shallow inspiration with atelectasis in the lung bases. Cardiac enlargement. Small right pleural effusion. No vascular congestion or edema. No pneumothorax. Mediastinal contours appear intact. Degenerative changes in the  spine. IMPRESSION: Shallow inspiration with atelectasis in the lung bases. Cardiac enlargement. Small right pleural effusion. Electronically Signed   By: Elsie Gravely M.D.   On: 02/08/2023 18:32    Cardiac Studies   Echocardiogram 02/09/2023: Impressions: 1. Left ventricular ejection fraction, by estimation, is 30 to 35%. The  left ventricle has moderately decreased function. The left ventricle  demonstrates global hypokinesis. Left ventricular diastolic function could  not be evaluated.   2. Right ventricular systolic function is moderately reduced. The right  ventricular size is moderately enlarged. Tricuspid regurgitation signal is  inadequate for assessing PA pressure.   3. Left atrial size was moderately dilated.   4. Right atrial size was moderately dilated.   5. The mitral valve is normal in structure. Mild to moderate mitral valve  regurgitation. No evidence of mitral stenosis.   6. The aortic valve is tricuspid. Aortic valve regurgitation is not  visualized. No aortic stenosis is present.    Patient Profile     63 y.o. male with a history of persistent atrial fibrillation (on Amiodarone , Digoxin , and Eliquis ), chronic HFpEF, hypotension on Midodrine , GERD and known gastritis with recent GI bleed in 01/2023, anxiety/ depression, and alcohol abuse who was admitted on 02/08/2023 with atrial fibrillation with RVR and acute on chronic CHF. Echo this admission showed a drop in EF to 30-35%.  Assessment & Plan    Atrial Fibrillation with RVR Initial diagnosed during and admission in 12/2022. He was started on Amiodarone , Digoxin , and Eliquis  at that time. DCCV was not attempted due to alcohol abuse and concern for medication  non-compliance. He now presents in rapid atrial fibrillation with rates as high as the 140s to 150s.SABRA He was started on IV Amiodarone . - Rates currently in the 90s to low 100s. - Potassium 3.4 and Magnesium  1.2 today. Repleted by primary team. - Continue IV Amiodarone .  - Continue Lopressor  25mg  twice daily. Recommend transitioning to Toprol -XL prior to discharge given reduced EF. - Continue Eliquis  5mg  twice daily.  - He is scheduled for TEE/ DCCV tomorrow.   Shared Decision Making/Informed Consent{ The risks [stroke, cardiac arrhythmias rarely resulting in the need for a temporary or permanent pacemaker, skin irritation or burns, esophageal damage, perforation (1:10,000 risk), bleeding, pharyngeal hematoma as well as other potential complications associated with conscious sedation including aspiration, arrhythmia, respiratory failure and death], benefits (treatment guidance, restoration of normal sinus rhythm, diagnostic support) and alternatives of a transesophageal echocardiogram guided cardioversion were discussed in detail with Mr. Nobile and he is willing to proceed.    Acute on Chronic HFrEF He was noted to be markedly volume overloaded on admission. BNP elevated at 570. Chest x-ray showed cardiac enlargement with small right pleural effusion. Echo showed LVEF of 30-35% with global hypokinesis, moderate reduced RV function, moderate biatrial enlargement, and milt to moderate MR. EF down from 50-55% in 12/2022 - likely tachymediated in nature. He is being diuresed with IV Lasix . Documented urinary output of 1.4 yesterday and another 1L so far this morning. - He is still markedly volume overloaded. - Continue IV Lasix  40mg  twice daily. Hesitant to increase this with his soft BP requiring Midodrine . - Continue Lopressor  25mg  twice daily. Can transition to Toprol -XL prior to discharged. - No other GDMT at this time given hypotension requiring Midodrine . - Discussed with MD about possibly placing  a PICC line to get CO-OX to ensure no low output. But patient is diuresing okay and renal function stable. Lactic acid normal. Therefore, will hold on  this for now and continue to diuresis as above. - Presume cardiomyopathy is secondary to rapid atrial fibrillation. Recommend repeat Echo in 2-3 months after restoration of sinus rhythm. If no improvement in EF at that time, consider ischemic evaluation.  Hypotension Patient has a history of soft BP and was discharged on Midodrine  during last admission in 12/2022. - BP soft at times but overall stable. - Continue Midodrine  5mg  three times daily. - He seems to be tolerating low dose Lopressor  okay for now so okay to continue.   Chronic Anemia History of GI Bleed FOBT was positive during last admission in 12/2022. EGD showed gastritis but no signs of bleeding. - Anemia 9.3 today.  - Continue Protonix  40mg  daily. - Management per primary team.  Otherwise, per primary team: - Fever presumed secondary to coronavirus OC43 - Alcohol abuse - Elevated Total Bili  - Alcohol induced fatty liver - Hypoalbuminemia   For questions or updates, please contact [facility] Please consult [email or web address] for contact info under        Signed, Timmy Bubeck E Ellison Rieth, PA-C  02/10/2023, 9:35 AM

## 2023-02-10 NOTE — Evaluation (Signed)
 Occupational Therapy Evaluation Patient Details Name: Justin Fleming MRN: [id number] DOB: [birth year] Today's Date: 02/10/2023   History of Present Illness 63 y.o. male presented 02/08/23 with tachycardia, febrile, upper respiratory symptoms. +COVID  Afib with RVR   PMH significant of a-fib, CHF, gout, GERD, alcohol abuse.   Clinical Impression   Pt reports independence in ambulation and ADLs prior to admission. He lives with his sister who assists with meal prep, housekeeping and was managing his meds, but he has taken that over. Pt presents with LE pain due to gout flare. He is able to complete bed mobility mod I, but unable to demonstrate standing due to pain. Pt requires set up to total assistance for ADLs. Patient will benefit from continued inpatient follow up therapy, <3 hours/day. Pt strongly prefers to return home. May progress to home once LE pain improves.       If plan is discharge home, recommend the following: Assistance with cooking/housework;A lot of help with walking and/or transfers;A lot of help with bathing/dressing/bathroom;Assist for transportation;Help with stairs or ramp for entrance    Functional Status Assessment  Patient has had a recent decline in their functional status and demonstrates the ability to make significant improvements in function in a reasonable and predictable amount of time.  Equipment Recommendations  Tub/shower bench    Recommendations for Other Services       Precautions / Restrictions Precautions Precautions: Fall Precaution Comments: watch HR Restrictions Weight Bearing Restrictions Per Provider Order: No      Mobility Bed Mobility Overal bed mobility: Modified Independent             General bed mobility comments: increased time, use of bed rail, HOB elevated, prefers to not have help    Transfers                   General transfer comment: unable due to bil LE pain      Balance Overall balance assessment:  Needs assistance   Sitting balance-Leahy Scale: Good                                     ADL either performed or assessed with clinical judgement   ADL Overall ADL's : Needs assistance/impaired Eating/Feeding: Independent;Bed level   Grooming: Wash/dry hands;Wash/dry face;Oral care;Sitting;Supervision/safety   Upper Body Bathing: Minimal assistance;Sitting   Lower Body Bathing: Total assistance;Bed level   Upper Body Dressing : Set up;Sitting   Lower Body Dressing: Total assistance;Bed level                       Vision Baseline Vision/History: 1 Wears glasses Ability to See in Adequate Light: 0 Adequate Patient Visual Report: No change from baseline Additional Comments: wears reading glasses     Perception         Praxis         Pertinent Vitals/Pain Pain Assessment Pain Assessment: Faces Faces Pain Scale: Hurts whole lot Pain Location: bil LEs Pain Descriptors / Indicators: Aching, Constant Pain Intervention(s): Monitored during session, Repositioned     Extremity/Trunk Assessment Upper Extremity Assessment Upper Extremity Assessment: Overall WFL for tasks assessed   Lower Extremity Assessment Lower Extremity Assessment: Defer to PT evaluation   Cervical / Trunk Assessment Cervical / Trunk Assessment: Normal   Communication Communication Communication: No apparent difficulties   Cognition Arousal: Alert Behavior During Therapy: Flat affect Overall Cognitive Status:  Within Functional Limits for tasks assessed                           Safety/Judgement: Decreased awareness of deficits, Decreased awareness of safety     General Comments: aware of medical situation and plan for TEE and possible shocking my heart tomorrow     General Comments       Exercises     Shoulder Instructions      Home Living Family/patient expects to be discharged to:: Private residence Living Arrangements: Other relatives  (sister) Available Help at Discharge: Family;Available PRN/intermittently (sister works) Type of Home: House Home Access: Stairs to enter Secretary/administrator of Steps: 3 Entrance Stairs-Rails: Right Home Layout: One level     Bathroom Shower/Tub: Chief Strategy Officer: Standard     Home Equipment: Agricultural Consultant (2 wheels)   Additional Comments: info re: sister's home      Prior Functioning/Environment Prior Level of Function : Needs assist             Mobility Comments: does not use a device; reports recent gout bil LEs getting worse ADLs Comments: needs a shower seat, assisted to cut toenails, sister does meal prep and housekeeping        OT Problem List: Pain;Increased edema;Decreased activity tolerance;Decreased knowledge of use of DME or AE;Cardiopulmonary status limiting activity;Decreased strength      OT Treatment/Interventions: Self-care/ADL training;DME and/or AE instruction;Therapeutic activities;Patient/family education;Balance training    OT Goals(Current goals can be found in the care plan section) Acute Rehab OT Goals OT Goal Formulation: With patient Time For Goal Achievement: 02/24/23 Potential to Achieve Goals: Good ADL Goals Pt Will Perform Grooming: with supervision;standing Pt Will Perform Lower Body Bathing: with supervision;sit to/from stand Pt Will Perform Lower Body Dressing: with supervision;sit to/from stand Pt Will Transfer to Toilet: with supervision;ambulating Pt Will Perform Toileting - Clothing Manipulation and hygiene: with supervision;sit to/from stand;sitting/lateral leans Additional ADL Goal #1: Pt will employ energy conservation strategies in ADLs and mobility.  OT Frequency: Min 1X/week    Co-evaluation              AM-PAC OT 6 Clicks Daily Activity     Outcome Measure Help from another person eating meals?: None Help from another person taking care of personal grooming?: A Little Help from another  person toileting, which includes using toliet, bedpan, or urinal?: A Lot Help from another person bathing (including washing, rinsing, drying)?: A Lot Help from another person to put on and taking off regular upper body clothing?: A Little Help from another person to put on and taking off regular lower body clothing?: Total 6 Click Score: 15   End of Session Equipment Utilized During Treatment: Oxygen (2L)  Activity Tolerance: Patient limited by pain Patient left: in bed;with call bell/phone within reach  OT Visit Diagnosis: Pain;Muscle weakness (generalized) (M62.81)                Time: 8896-8877 OT Time Calculation (min): 19 min Charges:  OT General Charges $OT Visit: 1 Visit OT Evaluation $OT Eval Moderate Complexity: 1 Mod  Mliss HERO, OTR/L Acute Rehabilitation Services Office: [phone]   Kennth Mliss Helling 02/10/2023, 11:34 AM

## 2023-02-10 NOTE — TOC Progression Note (Addendum)
 Transition of Care [facility]) - Progression Note    Patient Details  Name: Justin Fleming MRN: [id number] Date of Birth: 10-21-1960  Transition of Care [facility]) CM/SW Contact  Isaiah Public, LCSWA Phone Number: 02/10/2023, 4:32 PM  Clinical Narrative:     CSW spoke with patient regarding PT recommendations of SNF placement at time of discharge. Patient reports he comes from home with sister. Patient expressed understanding of PT recommendation and is agreeable for CSW to fax him out for possible SNF placement at time of discharge. Patient wants to see what his options are with SNF, and will confirm plan closer to being medically ready for dc.  CSW discussed insurance authorization process. No further questions reported at this time. CSW to continue to follow and assist with discharge planning needs.   Expected Discharge Plan:  (skilled vrs.home agreeable to fax out for snf) Barriers to Discharge: Continued Medical Work up  Expected Discharge Plan and Services In-house Referral: Clinical Social Work     Living arrangements for the past 2 months: Single Family Home                                       Social Determinants of Health (SDOH) Interventions SDOH Screenings   Food Insecurity: No Food Insecurity (02/08/2023)  Housing: Low Risk  (02/08/2023)  Transportation Needs: No Transportation Needs (02/08/2023)  Utilities: Not At Risk (02/08/2023)  Alcohol Screen: Low Risk  (02/02/2018)  Depression (PHQ2-9): Low Risk  (02/02/2018)  Social Connections: Socially Isolated (02/08/2023)  Tobacco Use: Medium Risk (02/08/2023)    Readmission Risk Interventions    01/11/2023   12:44 PM  Readmission Risk Prevention Plan  Transportation Screening Complete  PCP or Specialist Appt within 5-7 Days Not Complete  Home Care Screening Complete  Medication Review (RN CM) Complete

## 2023-02-10 NOTE — Progress Notes (Signed)
 Rounding Note    Patient Name: Justin Fleming Date of Encounter: 02/10/2023  [facility] Cardiologist: Jayson Sierras, MD   Subjective   Patient's fevers spiked at 102.9 last night. He denies any shortness. He remains markedly volume overloaded. He I still in atrial fibrillation. Rates increased to the 140s last night but currently in the low 100s. No chest pain or palpitations.   Inpatient Medications    Scheduled Meds:  amiodarone   150 mg Intravenous Once   apixaban   5 mg Oral BID   furosemide   40 mg Intravenous BID   guaiFENesin   600 mg Oral BID   metoprolol  tartrate  25 mg Oral BID   midodrine   15 mg Oral TID WC   nicotine   21 mg Transdermal Daily   pantoprazole   40 mg Oral BID   potassium chloride   40 mEq Oral BID   sodium chloride  flush  3 mL Intravenous Q12H   thiamine   100 mg Oral Daily   Continuous Infusions:  amiodarone  30 mg/hr (02/10/23 0814)   magnesium  sulfate bolus IVPB     PRN Meds: acetaminophen  **OR** acetaminophen , HYDROcodone -acetaminophen , ondansetron  **OR** ondansetron  (ZOFRAN ) IV, sodium chloride  flush   Vital Signs    Vitals:   02/10/23 0613 02/10/23 0700 02/10/23 0800 02/10/23 0900  BP:  100/83 95/75 (!) 126/104  Pulse:  78 90 98  Resp:  19 14 17   Temp: 98.2 F (36.8 C)     TempSrc: Oral     SpO2:  100% 100% 100%  Weight:      Height:        Intake/Output Summary (Last 24 hours) at 02/10/2023 0935 Last data filed at 02/09/2023 1202 Gross per 24 hour  Intake --  Output 900 ml  Net -900 ml      02/08/2023    4:19 PM 02/08/2023    2:33 PM 01/26/2023    1:32 PM  Last 3 Weights  Weight (lbs) 198 lb 198 lb 207 lb 6.4 oz  Weight (kg) 89.812 kg 89.812 kg 94.076 kg      Telemetry    Atrial fibrillation. Rates currently in the 90s to low 100s. - Personally Reviewed  ECG    No new ECG tracing. - Personally Reviewed  Physical Exam   GEN: African-American male in no acute distress.   Neck: JVD elevated. Cardiac:  Borderline tachycardic with irregularly irregular rhythm. No murmurs, rubs, or gallops.  Respiratory: No increased work of breathing. Decreased breath sounds in bilateral bases. No significant crackles. GI: Soft and non-tender but distended. MS: 3+ pitting edema o bilateral lower extremities. Neuro:  No focal deficits. Psych: Normal affect. Responds appropriately.  Labs    High Sensitivity Troponin:   Recent Labs  Lab 02/08/23 1639 02/08/23 1844 02/08/23 2304 02/09/23 0222  TROPONINIHS 49* 53* 68* 67*     Chemistry Recent Labs  Lab 02/08/23 1639 02/09/23 0222 02/10/23 0455  NA 142 139 134*  K 3.7 4.3 3.4*  CL 100 98 93*  CO2 27 27 27   GLUCOSE 102* 90 209*  BUN 15 14 16   CREATININE 1.49* 1.36* 1.40*  CALCIUM 8.8* 8.6* 8.1*  MG  --  1.5* 1.2*  PROT 7.2 7.1  --   ALBUMIN  3.3* 3.1* 2.6*  AST 21 30  --   ALT 13 15  --   ALKPHOS 60 62  --   BILITOT 1.3* 1.8*  --   GFRNONAA 52* 58* 56*  ANIONGAP 15 14 14     Lipids  No results for input(s): CHOL, TRIG, HDL, LABVLDL, LDLCALC, CHOLHDL in the last 168 hours.  Hematology Recent Labs  Lab 02/08/23 1639 02/09/23 0222 02/10/23 0455  WBC 8.2 9.2 8.2  RBC 3.18* 3.32*  3.35* 3.00*  HGB 9.9* 10.3* 9.3*  HCT 31.2* 32.8* 28.7*  MCV 98.1 98.8 95.7  MCH 31.1 31.0 31.0  MCHC 31.7 31.4 32.4  RDW 19.0* 19.4* 18.9*  PLT 172 165 160   Thyroid  Recent Labs  Lab 02/09/23 0222  TSH 2.743    BNP Recent Labs  Lab 02/08/23 1639  BNP 570.9*    DDimer No results for input(s): DDIMER in the last 168 hours.   Radiology    ECHOCARDIOGRAM COMPLETE Result Date: 02/09/2023    ECHOCARDIOGRAM REPORT   Patient Name:   Justin Fleming Date of Exam: 02/09/2023 Medical Rec #:  [id number]        Height:       70.0 in Accession #:    [id number]       Weight:       198.0 lb Date of Birth:  10-15-1960        BSA:          2.078 m Patient Age:    63 years         BP:           113/69 mmHg Patient Gender: M                HR:            120 bpm. Exam Location:  Inpatient Procedure: 2D Echo, Cardiac Doppler, Color Doppler and Intracardiac            Opacification Agent Indications:    Congestive Heart Failure  History:        Patient has prior history of Echocardiogram examinations, most                 recent 12/31/2022. Risk Factors:Hypertension and Former Smoker.  Sonographer:    Ozell Free Referring Phys: [id number] ANASTASSIA DOUTOVA  Sonographer Comments: Technically challenging study due to limited acoustic windows. Image acquisition challenging due to patient body habitus. IMPRESSIONS  1. Left ventricular ejection fraction, by estimation, is 30 to 35%. The left ventricle has moderately decreased function. The left ventricle demonstrates global hypokinesis. Left ventricular diastolic function could not be evaluated.  2. Right ventricular systolic function is moderately reduced. The right ventricular size is moderately enlarged. Tricuspid regurgitation signal is inadequate for assessing PA pressure.  3. Left atrial size was moderately dilated.  4. Right atrial size was moderately dilated.  5. The mitral valve is normal in structure. Mild to moderate mitral valve regurgitation. No evidence of mitral stenosis.  6. The aortic valve is tricuspid. Aortic valve regurgitation is not visualized. No aortic stenosis is present. Comparison(s): Prior images reviewed side by side. The left ventricular function is worsened. FINDINGS  Left Ventricle: Mobile echodensity at the apex appears to be a false tendon. Left ventricular ejection fraction, by estimation, is 30 to 35%. The left ventricle has moderately decreased function. The left ventricle demonstrates global hypokinesis. Definity  contrast agent was given IV to delineate the left ventricular endocardial borders. The left ventricular internal cavity size was normal in size. There is borderline concentric left ventricular hypertrophy. Left ventricular diastolic function could not be evaluated due to atrial  fibrillation. Left ventricular diastolic function could not be evaluated. Right Ventricle: The right ventricular size is moderately enlarged. Right vetricular wall  thickness was not well visualized. Right ventricular systolic function is moderately reduced. Tricuspid regurgitation signal is inadequate for assessing PA pressure. Left Atrium: Left atrial size was moderately dilated. Right Atrium: Right atrial size was moderately dilated. Pericardium: There is no evidence of pericardial effusion. Mitral Valve: The mitral valve is normal in structure. Mild to moderate mitral valve regurgitation, with centrally-directed jet. No evidence of mitral valve stenosis. MV peak gradient, 6.9 mmHg. The mean mitral valve gradient is 3.7 mmHg. Tricuspid Valve: The tricuspid valve is grossly normal. Tricuspid valve regurgitation is not demonstrated. Aortic Valve: The aortic valve is tricuspid. Aortic valve regurgitation is not visualized. No aortic stenosis is present. Aortic valve mean gradient measures 3.0 mmHg. Aortic valve peak gradient measures 4.6 mmHg. Aortic valve area, by VTI measures 2.04 cm. Pulmonic Valve: The pulmonic valve was not well visualized. Pulmonic valve regurgitation is not visualized. No evidence of pulmonic stenosis. Aorta: The aortic root is normal in size and structure. IAS/Shunts: The interatrial septum was not well visualized.  LEFT VENTRICLE PLAX 2D LVIDd:         4.30 cm     Diastology LVIDs:         3.60 cm     LV e' medial:    5.51 cm/s LV PW:         1.10 cm     LV E/e' medial:  23.1 LV IVS:        1.20 cm     LV e' lateral:   9.29 cm/s LVOT diam:     1.90 cm     LV E/e' lateral: 13.7 LV SV:         29 LV SV Index:   14 LVOT Area:     2.84 cm  LV Volumes (MOD) LV vol d, MOD A2C: 98.5 ml LV vol d, MOD A4C: 70.6 ml LV vol s, MOD A2C: 62.5 ml LV vol s, MOD A4C: 51.3 ml LV SV MOD A2C:     36.0 ml LV SV MOD A4C:     70.6 ml LV SV MOD BP:      26.5 ml RIGHT VENTRICLE            IVC RV Basal diam:  4.50  cm    IVC diam: 2.30 cm RV S prime:     8.37 cm/s LEFT ATRIUM              Index        RIGHT ATRIUM           Index LA diam:        4.20 cm  2.02 cm/m   RA Area:     22.20 cm LA Vol (A2C):   117.0 ml 56.29 ml/m  RA Volume:   70.70 ml  34.02 ml/m LA Vol (A4C):   55.2 ml  26.56 ml/m LA Biplane Vol: 81.1 ml  39.02 ml/m  AORTIC VALVE AV Area (Vmax):    1.89 cm AV Area (Vmean):   1.80 cm AV Area (VTI):     2.04 cm AV Vmax:           107.43 cm/s AV Vmean:          79.233 cm/s AV VTI:            0.141 m AV Peak Grad:      4.6 mmHg AV Mean Grad:      3.0 mmHg LVOT Vmax:         71.57 cm/s  LVOT Vmean:        50.200 cm/s LVOT VTI:          0.102 m LVOT/AV VTI ratio: 0.72  AORTA Ao Root diam: 3.50 cm MITRAL VALVE MV Area (PHT): 5.32 cm     SHUNTS MV Area VTI:   1.80 cm     Systemic VTI:  0.10 m MV Peak grad:  6.9 mmHg     Systemic Diam: 1.90 cm MV Mean grad:  3.7 mmHg MV Vmax:       1.32 m/s MV Vmean:      88.5 cm/s MV Decel Time: 143 msec MR Peak grad: 81.5 mmHg MR Vmax:      451.33 cm/s MV E velocity: 127.33 cm/s Jerel Balding MD Electronically signed by Jerel Balding MD Signature Date/Time: 02/09/2023/10:31:05 AM    Final    US  Abdomen Complete Result Date: 02/08/2023 CLINICAL DATA:  [id number] with acute kidney injury. 13517.  Anasarca. History of liver disease by prior abdominal ultrasound order. EXAM: ABDOMEN ULTRASOUND COMPLETE COMPARISON:  Right upper quadrant ultrasound 01/04/2023 FINDINGS: Gallbladder: There is a mixture of sludge and tiny stones layering within the gallbladder, as before. There is thickening of the free wall which measures 6 mm today, previously 5.4 mm which was not associated with a positive sonographic Murphy's sign. This could be due to chronic liver disease, passive wall congestion, reactive wall thickening or chronic cholecystitis. Common bile duct: Diameter: 3.4 mm. No intrahepatic biliary dilatation. Liver: No focal lesion identified. Within normal limits in parenchymal  echogenicity. The liver is mildly prominent measuring 20 cm in length. Some images suggest slight surface lobulation in the left lobe which may be seen with cirrhosis. Portal vein is patent on color Doppler imaging with normal direction of blood flow towards the liver. The hepatic portal main vein is mildly prominent at 15 mm. IVC: Widely patent. Pancreas: Only the neck and body sections are visible. The head and tail, uncinate process obscured by bowel gas. The visualized portions unremarkable. Spleen: Obscured by bowel gas.  Not seen. Right Kidney: Length: 8.9 cm. Echogenicity of the renal cortex is mildly increased relative to liver. No mass, stones or hydronephrosis visualized. Left Kidney: Length: 9.9 cm. Echogenicity within normal limits. No mass, stones or hydronephrosis visualized. Abdominal aorta: No aneurysm visualized. Other findings: Mild perihepatic ascites, slightly greater than previously. IMPRESSION: 1. Mild perihepatic ascites, slightly greater than previously. 2. Mildly prominent liver with slight surface lobulation in the left lobe which may be seen with cirrhosis. 3. Mildly prominent hepatic portal main vein at 15 mm. No portal flow reversal. 4. Sludge and tiny stones in the gallbladder with thickening of the free wall which was not associated with a positive sonographic Murphy's sign. This could be due to chronic liver disease, passive wall congestion, reactive wall thickening or chronic cholecystitis. Similar wall thickening was noted on 01/04/2023. 5. Mildly increased echogenicity of the right renal cortex relative to liver. This can be seen with medical renal disease. Left kidney is sonographically unremarkable. 6. Spleen not seen. 7. Limited visualization of the pancreas, unremarkable where visible. Electronically Signed   By: Francis Quam M.D.   On: 02/08/2023 23:16   DG Chest Port 1 View Result Date: 02/08/2023 CLINICAL DATA:  Shortness of breath today EXAM: PORTABLE CHEST 1 VIEW  COMPARISON:  01/11/2023 FINDINGS: Shallow inspiration with atelectasis in the lung bases. Cardiac enlargement. Small right pleural effusion. No vascular congestion or edema. No pneumothorax. Mediastinal contours appear intact. Degenerative changes in the  spine. IMPRESSION: Shallow inspiration with atelectasis in the lung bases. Cardiac enlargement. Small right pleural effusion. Electronically Signed   By: Elsie Gravely M.D.   On: 02/08/2023 18:32    Cardiac Studies   Echocardiogram 02/09/2023: Impressions: 1. Left ventricular ejection fraction, by estimation, is 30 to 35%. The  left ventricle has moderately decreased function. The left ventricle  demonstrates global hypokinesis. Left ventricular diastolic function could  not be evaluated.   2. Right ventricular systolic function is moderately reduced. The right  ventricular size is moderately enlarged. Tricuspid regurgitation signal is  inadequate for assessing PA pressure.   3. Left atrial size was moderately dilated.   4. Right atrial size was moderately dilated.   5. The mitral valve is normal in structure. Mild to moderate mitral valve  regurgitation. No evidence of mitral stenosis.   6. The aortic valve is tricuspid. Aortic valve regurgitation is not  visualized. No aortic stenosis is present.    Patient Profile     63 y.o. male with a history of persistent atrial fibrillation (on Amiodarone , Digoxin , and Eliquis ), chronic HFpEF, hypotension on Midodrine , GERD and known gastritis with recent GI bleed in 01/2023, anxiety/ depression, and alcohol abuse who was admitted on 02/08/2023 with atrial fibrillation with RVR and acute on chronic CHF. Echo this admission showed a drop in EF to 30-35%.  Assessment & Plan    Atrial Fibrillation with RVR Initial diagnosed during and admission in 12/2022. He was started on Amiodarone , Digoxin , and Eliquis  at that time. DCCV was not attempted due to alcohol abuse and concern for medication  non-compliance. He now presents in rapid atrial fibrillation with rates as high as the 140s to 150s.SABRA He was started on IV Amiodarone . - Rates currently in the 90s to low 100s. - Potassium 3.4 and Magnesium  1.2 today. Repleted by primary team. - Continue IV Amiodarone .  - Continue Lopressor  25mg  twice daily. Recommend transitioning to Toprol -XL prior to discharge given reduced EF. - Continue Eliquis  5mg  twice daily.  - He is scheduled for TEE/ DCCV tomorrow.   Shared Decision Making/Informed Consent{ The risks [stroke, cardiac arrhythmias rarely resulting in the need for a temporary or permanent pacemaker, skin irritation or burns, esophageal damage, perforation (1:10,000 risk), bleeding, pharyngeal hematoma as well as other potential complications associated with conscious sedation including aspiration, arrhythmia, respiratory failure and death], benefits (treatment guidance, restoration of normal sinus rhythm, diagnostic support) and alternatives of a transesophageal echocardiogram guided cardioversion were discussed in detail with Justin Fleming and he is willing to proceed.    Acute on Chronic HFrEF He was noted to be markedly volume overloaded on admission. BNP elevated at 570. Chest x-ray showed cardiac enlargement with small right pleural effusion. Echo showed LVEF of 30-35% with global hypokinesis, moderate reduced RV function, moderate biatrial enlargement, and milt to moderate MR. EF down from 50-55% in 12/2022 - likely tachymediated in nature. He is being diuresed with IV Lasix . Documented urinary output of 1.4 yesterday and another 1L so far this morning. - He is still markedly volume overloaded. - Continue IV Lasix  40mg  twice daily. Hesitant to increase this with his soft BP requiring Midodrine . - Continue Lopressor  25mg  twice daily. Can transition to Toprol -XL prior to discharged. - No other GDMT at this time given hypotension requiring Midodrine . - Discussed with MD about possibly placing  a PICC line to get CO-OX to ensure no low output. But patient is diuresing okay and renal function stable. Lactic acid normal. Therefore, will hold on  this for now and continue to diuresis as above. - Presume cardiomyopathy is secondary to rapid atrial fibrillation. Recommend repeat Echo in 2-3 months after restoration of sinus rhythm. If no improvement in EF at that time, consider ischemic evaluation.  Hypotension Patient has a history of soft BP and was discharged on Midodrine  during last admission in 12/2022. - BP soft at times but overall stable. - Continue Midodrine  5mg  three times daily. - He seems to be tolerating low dose Lopressor  okay for now so okay to continue.   Chronic Anemia History of GI Bleed FOBT was positive during last admission in 12/2022. EGD showed gastritis but no signs of bleeding. - Anemia 9.3 today.  - Continue Protonix  40mg  daily. - Management per primary team.  Otherwise, per primary team: - Fever presumed secondary to coronavirus OC43 - Alcohol abuse - Elevated Total Bili  - Alcohol induced fatty liver - Hypoalbuminemia   For questions or updates, please contact [facility] Please consult [email or web address] for contact info under        Signed, Timmy Bubeck E Ellison Rieth, PA-C  02/10/2023, 9:35 AM

## 2023-02-11 ENCOUNTER — Telehealth (HOSPITAL_COMMUNITY): Payer: Self-pay | Admitting: Pharmacy Technician

## 2023-02-11 ENCOUNTER — Other Ambulatory Visit (HOSPITAL_COMMUNITY): Payer: Self-pay

## 2023-02-11 ENCOUNTER — Inpatient Hospital Stay (HOSPITAL_COMMUNITY): Payer: Medicare Other

## 2023-02-11 ENCOUNTER — Inpatient Hospital Stay (HOSPITAL_COMMUNITY): Payer: Medicare Other | Admitting: Anesthesiology

## 2023-02-11 ENCOUNTER — Encounter (HOSPITAL_COMMUNITY)
Admission: EM | Disposition: A | Discharge status: Skilled Nursing Facility | Payer: Self-pay | Source: Ambulatory Visit | Attending: Internal Medicine

## 2023-02-11 DIAGNOSIS — I4891 Unspecified atrial fibrillation: Secondary | ICD-10-CM | POA: Diagnosis not present

## 2023-02-11 DIAGNOSIS — Q2112 Patent foramen ovale: Secondary | ICD-10-CM | POA: Diagnosis not present

## 2023-02-11 DIAGNOSIS — R7989 Other specified abnormal findings of blood chemistry: Secondary | ICD-10-CM | POA: Diagnosis not present

## 2023-02-11 DIAGNOSIS — I110 Hypertensive heart disease with heart failure: Secondary | ICD-10-CM | POA: Diagnosis not present

## 2023-02-11 DIAGNOSIS — I5033 Acute on chronic diastolic (congestive) heart failure: Secondary | ICD-10-CM | POA: Diagnosis not present

## 2023-02-11 DIAGNOSIS — Z87891 Personal history of nicotine dependence: Secondary | ICD-10-CM

## 2023-02-11 DIAGNOSIS — I361 Nonrheumatic tricuspid (valve) insufficiency: Secondary | ICD-10-CM

## 2023-02-11 DIAGNOSIS — I5021 Acute systolic (congestive) heart failure: Secondary | ICD-10-CM

## 2023-02-11 DIAGNOSIS — I340 Nonrheumatic mitral (valve) insufficiency: Secondary | ICD-10-CM

## 2023-02-11 DIAGNOSIS — F1010 Alcohol abuse, uncomplicated: Secondary | ICD-10-CM | POA: Diagnosis not present

## 2023-02-11 HISTORY — PX: CARDIOVERSION: EP1203

## 2023-02-11 HISTORY — PX: TRANSESOPHAGEAL ECHOCARDIOGRAM (CATH LAB): EP1270

## 2023-02-11 LAB — CBC WITH DIFFERENTIAL/PLATELET
Abs Immature Granulocytes: 0.04 10*3/uL (ref 0.00–0.07)
Basophils Absolute: 0 10*3/uL (ref 0.0–0.1)
Basophils Relative: 0 %
Eosinophils Absolute: 0.3 10*3/uL (ref 0.0–0.5)
Eosinophils Relative: 3 %
HCT: 28.4 % — ABNORMAL LOW (ref 39.0–52.0)
Hemoglobin: 9.1 g/dL — ABNORMAL LOW (ref 13.0–17.0)
Immature Granulocytes: 1 %
Lymphocytes Relative: 14 %
Lymphs Abs: 1.1 10*3/uL (ref 0.7–4.0)
MCH: 30.5 pg (ref 26.0–34.0)
MCHC: 32 g/dL (ref 30.0–36.0)
MCV: 95.3 fL (ref 80.0–100.0)
Monocytes Absolute: 0.9 10*3/uL (ref 0.1–1.0)
Monocytes Relative: 12 %
Neutro Abs: 5.4 10*3/uL (ref 1.7–7.7)
Neutrophils Relative %: 70 %
Platelets: 175 10*3/uL (ref 150–400)
RBC: 2.98 MIL/uL — ABNORMAL LOW (ref 4.22–5.81)
RDW: 19 % — ABNORMAL HIGH (ref 11.5–15.5)
WBC: 7.7 10*3/uL (ref 4.0–10.5)
nRBC: 0 % (ref 0.0–0.2)

## 2023-02-11 LAB — COMPREHENSIVE METABOLIC PANEL
ALT: 11 U/L (ref 0–44)
AST: 19 U/L (ref 15–41)
Albumin: 2.5 g/dL — ABNORMAL LOW (ref 3.5–5.0)
Alkaline Phosphatase: 57 U/L (ref 38–126)
Anion gap: 10 (ref 5–15)
BUN: 19 mg/dL (ref 8–23)
CO2: 30 mmol/L (ref 22–32)
Calcium: 8 mg/dL — ABNORMAL LOW (ref 8.9–10.3)
Chloride: 95 mmol/L — ABNORMAL LOW (ref 98–111)
Creatinine, Ser: 1.47 mg/dL — ABNORMAL HIGH (ref 0.61–1.24)
GFR, Estimated: 53 mL/min — ABNORMAL LOW (ref >60)
Glucose, Bld: 105 mg/dL — ABNORMAL HIGH (ref 70–99)
Potassium: 3.8 mmol/L (ref 3.5–5.1)
Sodium: 135 mmol/L (ref 135–145)
Total Bilirubin: 1.1 mg/dL (ref 0.0–1.2)
Total Protein: 6.3 g/dL — ABNORMAL LOW (ref 6.5–8.1)

## 2023-02-11 LAB — MAGNESIUM: Magnesium: 1.9 mg/dL (ref 1.7–2.4)

## 2023-02-11 LAB — ECHO TEE

## 2023-02-11 LAB — PHOSPHORUS: Phosphorus: 3.7 mg/dL (ref 2.5–4.6)

## 2023-02-11 LAB — VITAMIN B1: Vitamin B1 (Thiamine): 159.1 nmol/L (ref 66.5–200.0)

## 2023-02-11 SURGERY — TRANSESOPHAGEAL ECHOCARDIOGRAM (TEE) (CATHLAB)
Anesthesia: Monitor Anesthesia Care

## 2023-02-11 MED ORDER — SODIUM CHLORIDE 0.9 % IV SOLN
INTRAVENOUS | Status: DC
Start: 2023-02-11 — End: 2023-02-11

## 2023-02-11 MED ORDER — AMIODARONE HCL 200 MG PO TABS
200.0000 mg | ORAL_TABLET | Freq: Two times a day (BID) | ORAL | Status: DC
  Administered 2023-02-11 – 2023-02-12 (×2): 200 mg via ORAL
  Filled 2023-02-11 (×2): qty 1

## 2023-02-11 MED ORDER — PROPOFOL 500 MG/50ML IV EMUL
INTRAVENOUS | Status: DC | PRN
  Administered 2023-02-11: 80 ug/kg/min via INTRAVENOUS

## 2023-02-11 MED ORDER — FUROSEMIDE 10 MG/ML IJ SOLN
40.0000 mg | Freq: Every day | INTRAMUSCULAR | Status: DC
Start: 2023-02-12 — End: 2023-02-12
  Filled 2023-02-11: qty 4

## 2023-02-11 MED ORDER — PROPOFOL 10 MG/ML IV BOLUS
INTRAVENOUS | Status: DC | PRN
  Administered 2023-02-11: 40 mg via INTRAVENOUS

## 2023-02-11 MED ORDER — LIDOCAINE 2% (20 MG/ML) 5 ML SYRINGE
INTRAMUSCULAR | Status: DC | PRN
  Administered 2023-02-11: 100 mg via INTRAVENOUS

## 2023-02-11 MED ORDER — PHENYLEPHRINE HCL-NACL 20-0.9 MG/250ML-% IV SOLN
INTRAVENOUS | Status: DC | PRN
  Administered 2023-02-11: 160 ug via INTRAVENOUS
  Administered 2023-02-11: 80 ug via INTRAVENOUS
  Administered 2023-02-11: 160 ug via INTRAVENOUS
  Administered 2023-02-11: 240 ug via INTRAVENOUS
  Administered 2023-02-11: 160 ug via INTRAVENOUS
  Administered 2023-02-11: 40 ug/min via INTRAVENOUS

## 2023-02-11 SURGICAL SUPPLY — 1 items: PAD DEFIB RADIO PHYSIO CONN (PAD) ×1 IMPLANT

## 2023-02-11 NOTE — TOC Progression Note (Signed)
 Transition of Care [facility]) - Progression Note    Patient Details  Name: Justin Fleming MRN: [id number] Date of Birth: 1960-03-28  [facility]) CM/SW Contact  Isaiah Public, LCSWA Phone Number: 02/11/2023, 3:57 PM  Clinical Narrative:     CSW provided SNF bed offers to patient. Patient would like to discuss SNF bed offers with his sister, and he will call CSW back with SNF choice. CSW will continue to follow.  Expected Discharge Plan:  (skilled vrs.home agreeable to fax out for snf) Barriers to Discharge: Continued Medical Work up  Expected Discharge Plan and Services In-house Referral: Clinical Social Work     Living arrangements for the past 2 months: Single Family Home                                       Social Determinants of Health (SDOH) Interventions SDOH Screenings   Food Insecurity: No Food Insecurity (02/08/2023)  Housing: Low Risk  (02/08/2023)  Transportation Needs: No Transportation Needs (02/08/2023)  Utilities: Not At Risk (02/08/2023)  Alcohol Screen: Low Risk  (02/02/2018)  Depression (PHQ2-9): Low Risk  (02/02/2018)  Social Connections: Socially Isolated (02/08/2023)  Tobacco Use: Medium Risk (02/08/2023)    Readmission Risk Interventions    01/11/2023   12:44 PM  Readmission Risk Prevention Plan  Transportation Screening Complete  PCP or Specialist Appt within 5-7 Days Not Complete  Home Care Screening Complete  Medication Review (RN CM) Complete

## 2023-02-11 NOTE — Progress Notes (Signed)
 PROGRESS NOTE    Justin Fleming  FMW:[id number] DOB: Jul 27, 1960 DOA: 02/08/2023 PCP: Kayla Jeoffrey RAMAN, FNP   Brief Narrative:  The patient is a 63 year old AAM with a past medical history significant for but not limited to atrial fibrillation, CHF, gout, GERD, history of alcohol abuse and other comorbidities who continues to drink significant alcohol.  Is admitted with A-fib with RVR tachypnea and worsening edema and echocardiogram revealed left ventricular ejection fraction of 30 to 35%, global hypokinesis of left ventricle and reduced right ventricular function with moderate enlargement.  He also was noted to have worsening renal function.  Currently is getting diuresis and cardiology's been consulted and he is on amiodarone  drip for his uncontrolled heart rates with A-fib with RVR.  Respiratory virus panel was positive for coronavirus OC 43.  He is being diuresed and underwent TEE/DCCV today and has converted to NSR.   Assessment and Plan:  Alcohol Abuse, continuous: -Counseled to quit alcohol. -CIWA Protocol. -Low-dose to try Librium  25 Mg p.o. every 6 hourly x 4 doses and reviewed.   -Patient had remained significantly tachycardic, despite amiodarone  drip but HR's are improving and he is now in NSR and not Tachycardic at all   Chronic Normocytic Anemia -Likely Multifactorial. Hgb/Hct Trend: Recent Labs  Lab 01/14/23 0440 01/15/23 0457 01/16/23 0343 02/08/23 1639 02/09/23 0222 02/10/23 0455 02/11/23 0439  HGB 8.0* 8.4* 8.2* 9.9* 10.3* 9.3* 9.1*  HCT 26.0* 26.1* 26.3* 31.2* 32.8* 28.7* 28.4*  MCV 104.4* 103.2* 103.1* 98.1 98.8 95.7 95.3  -Obtained Anemia Panel with iron  level of 18, UIBC 219, TIBC of 237, saturation ratios of 8%, ferritin low 3-5, folate level 32.6 and a vitamin B12 432 -Transfuse for Hg <7 ,rapidly dropping or  if symptomatic -Continue to monitor for signs and symptoms bleeding; no overt bleeding noted   Atrial Fibrillation with RVR [facility]) s/p TEE/DCCV and  conversion to NSR -Cardiology input is appreciated. -Patient is on amiodarone  drip and being continued. -Cardiology team plans to add beta-blockers and he is now on metoprolol  tartrate 25 mg p.o. twice daily and cardiology recommends transitioning to Toprol -XL prior to discharge given his reduced EF -Continue apixaban . -TSH was 2.743 -Echocardiogram reveals new cardiomyopathy, probably tachycardia induced. -Cardiology is performing TEE/DCCV today and it was done; Findings on TEE showed Moderate reduced LV function, Mild MR,+PFO,Negative study for LAA thrombusand he is now in Sinus Rhythm   Acute on Chronic systolic CHF/Cardiomyopathy: -Cardiology team is directing care. -Currently he remains on amiodarone  and cardiology scheduling him for TEE/DCCV tomorrow -Continues to be markedly volume overloaded and BNP was elevated 570 on admission -Echocardiogram done and showed an LVEF of 30 to 35% with global hypokinesis, moderately reduced right ventricular function, moderate biatrial enlargement and mild to moderate MR; this EF is down from 50 to 55% back in December -Continuing IV Lasix  40 mg twice daily and cardiology is hesitant to increase with his soft blood pressures  Hypokalemia -Patient's K+ Level Trend: Recent Labs  Lab 01/14/23 0440 01/15/23 0457 01/16/23 0343 02/08/23 1639 02/09/23 0222 02/10/23 0455 02/11/23 0439  K 3.2* 3.7 3.4* 3.7 4.3 3.4* 3.8  -Replete with po Kcl 40 mEQ BID x2 yesterday -Continue to Monitor and Replete as Necessary -Repeat CMP in the AM   Hypomagnesemia -Patient's Mag Level Trend: Recent Labs  Lab 01/14/23 0440 01/15/23 0457 02/09/23 0222 02/10/23 0455 02/11/23 0439  MG 1.8 2.0 1.5* 1.2* 1.9  -Replete with IV Mag Sulfate 4 grams yesterday -Continue to Monitor and Replete as  Necessary -Repeat Mag in the AM   AKI vs CKD stage IIIa -BUN/Cr Trend: Recent Labs  Lab 01/14/23 0440 01/15/23 0457 01/16/23 0343 02/08/23 1639 02/09/23 0222  02/10/23 0455 02/11/23 0439  BUN 19 22 22 15 14 16 19   CREATININE 1.07 1.18 0.96 1.49* 1.36* 1.40* 1.47*  -Avoid Nephrotoxic Medications, Contrast Dyes, Hypotension and Dehydration to Ensure Adequate Renal Perfusion and will need to Renally Adjust Meds -Continue to Monitor and Trend Renal Function carefully and repeat CMP in the AM   Hyponatremia, improved and fluctuating  -Mild and likely in the setting of volume overload. Na+ Trend: Recent Labs  Lab 01/14/23 0440 01/15/23 0457 01/16/23 0343 02/08/23 1639 02/09/23 0222 02/10/23 0455 02/11/23 0439  NA 133* 130* 134* 142 139 134* 135  -Continue monitor trend and repeat CMP in a.m.   Elevated Troponin -In the setting of tachycardia -Troponin went from 68 -> 67 -Continue to monitor  -Cardiology is following   Alcohol induced fatty liver -Refer to GI team on discharge.   URI (Upper Respiratory Infection) with Coronavirus (Not COVID) -Respiratory panel significant for Coronavirus OC43.   -Blood cultures pending -Procalcitonin is 0.35.  -Patient is still intermittently been febrile and had SIRS criteria; spiked a temperature of 102.9 last night -Fortunately he has no leukocytosis -Continue with supportive care  Hyperbilirubinemia, improving -Bilirubin Trend: Recent Labs  Lab 02/08/23 1639 02/09/23 0222 02/10/23 0500 02/11/23 0439  BILITOT 1.3* 1.8* 1.3* 1.1  -Continue to Monitor and Trend and Repeat CMP in the AM  Tobacco Abuse -Smoking Cessation counseling given -C/w Nicotine  21 mg TD Patch  Hypoalbuminemia -Patient's Albumin  Trend: Recent Labs  Lab 02/08/23 1639 02/09/23 0222 02/10/23 0455 02/10/23 0500 02/11/23 0439  ALBUMIN  3.3* 3.1* 2.6* 2.6* 2.5*  -Continue to Monitor and Trend and repeat CMP in the AM  Overweight -Complicates overall prognosis and care -Estimated body mass index is 28.44 kg/m as calculated from the following:   Height as of this encounter: 5' 10 (1.778 m).   Weight as of this  encounter: 89.9 kg.  -Weight Loss and Dietary Counseling given   DVT prophylaxis:  apixaban  (ELIQUIS ) tablet 5 mg    Code Status: Full Code Family Communication: No family present at bedside   Disposition Plan:  Level of care: Telemetry Cardiac Status is: Inpatient Remains inpatient appropriate because: Needs further clinical Improvement and Clearance by the Cardiology Team   Consultants:  Cardiology  Procedures:  ECHOCARDIOGRAM IMPRESSIONS     1. Left ventricular ejection fraction, by estimation, is 30 to 35%. The  left ventricle has moderately decreased function. The left ventricle  demonstrates global hypokinesis. Left ventricular diastolic function could  not be evaluated.   2. Right ventricular systolic function is moderately reduced. The right  ventricular size is moderately enlarged. Tricuspid regurgitation signal is  inadequate for assessing PA pressure.   3. Left atrial size was moderately dilated.   4. Right atrial size was moderately dilated.   5. The mitral valve is normal in structure. Mild to moderate mitral valve  regurgitation. No evidence of mitral stenosis.   6. The aortic valve is tricuspid. Aortic valve regurgitation is not  visualized. No aortic stenosis is present.   Comparison(s): Prior images reviewed side by side. The left ventricular  function is worsened.   FINDINGS   Left Ventricle: Mobile echodensity at the apex appears to be a false  tendon. Left ventricular ejection fraction, by estimation, is 30 to 35%.  The left ventricle has moderately decreased function.  The left ventricle  demonstrates global hypokinesis.  Definity  contrast agent was given IV to delineate the left ventricular  endocardial borders. The left ventricular internal cavity size was normal  in size. There is borderline concentric left ventricular hypertrophy. Left  ventricular diastolic function  could not be evaluated due to atrial fibrillation. Left ventricular  diastolic  function could not be evaluated.   Right Ventricle: The right ventricular size is moderately enlarged. Right  vetricular wall thickness was not well visualized. Right ventricular  systolic function is moderately reduced. Tricuspid regurgitation signal is  inadequate for assessing PA  pressure.   Left Atrium: Left atrial size was moderately dilated.   Right Atrium: Right atrial size was moderately dilated.   Pericardium: There is no evidence of pericardial effusion.   Mitral Valve: The mitral valve is normal in structure. Mild to moderate  mitral valve regurgitation, with centrally-directed jet. No evidence of  mitral valve stenosis. MV peak gradient, 6.9 mmHg. The mean mitral valve  gradient is 3.7 mmHg.   Tricuspid Valve: The tricuspid valve is grossly normal. Tricuspid valve  regurgitation is not demonstrated.   Aortic Valve: The aortic valve is tricuspid. Aortic valve regurgitation is  not visualized. No aortic stenosis is present. Aortic valve mean gradient  measures 3.0 mmHg. Aortic valve peak gradient measures 4.6 mmHg. Aortic  valve area, by VTI measures 2.04  cm.   Pulmonic Valve: The pulmonic valve was not well visualized. Pulmonic valve  regurgitation is not visualized. No evidence of pulmonic stenosis.   Aorta: The aortic root is normal in size and structure.   IAS/Shunts: The interatrial septum was not well visualized.     LEFT VENTRICLE  PLAX 2D  LVIDd:         4.30 cm     Diastology  LVIDs:         3.60 cm     LV e' medial:    5.51 cm/s  LV PW:         1.10 cm     LV E/e' medial:  23.1  LV IVS:        1.20 cm     LV e' lateral:   9.29 cm/s  LVOT diam:     1.90 cm     LV E/e' lateral: 13.7  LV SV:         29  LV SV Index:   14  LVOT Area:     2.84 cm    LV Volumes (MOD)  LV vol d, MOD A2C: 98.5 ml  LV vol d, MOD A4C: 70.6 ml  LV vol s, MOD A2C: 62.5 ml  LV vol s, MOD A4C: 51.3 ml  LV SV MOD A2C:     36.0 ml  LV SV MOD A4C:     70.6 ml  LV SV MOD  BP:      26.5 ml   RIGHT VENTRICLE            IVC  RV Basal diam:  4.50 cm    IVC diam: 2.30 cm  RV S prime:     8.37 cm/s   LEFT ATRIUM              Index        RIGHT ATRIUM           Index  LA diam:        4.20 cm  2.02 cm/m   RA Area:     22.20 cm  LA Vol (A2C):   117.0 ml 56.29 ml/m  RA Volume:   70.70 ml  34.02 ml/m  LA Vol (A4C):   55.2 ml  26.56 ml/m  LA Biplane Vol: 81.1 ml  39.02 ml/m   AORTIC VALVE  AV Area (Vmax):    1.89 cm  AV Area (Vmean):   1.80 cm  AV Area (VTI):     2.04 cm  AV Vmax:           107.43 cm/s  AV Vmean:          79.233 cm/s  AV VTI:            0.141 m  AV Peak Grad:      4.6 mmHg  AV Mean Grad:      3.0 mmHg  LVOT Vmax:         71.57 cm/s  LVOT Vmean:        50.200 cm/s  LVOT VTI:          0.102 m  LVOT/AV VTI ratio: 0.72    AORTA  Ao Root diam: 3.50 cm   MITRAL VALVE  MV Area (PHT): 5.32 cm     SHUNTS  MV Area VTI:   1.80 cm     Systemic VTI:  0.10 m  MV Peak grad:  6.9 mmHg     Systemic Diam: 1.90 cm  MV Mean grad:  3.7 mmHg  MV Vmax:       1.32 m/s  MV Vmean:      88.5 cm/s  MV Decel Time: 143 msec  MR Peak grad: 81.5 mmHg  MR Vmax:      451.33 cm/s  MV E velocity: 127.33 cm/s   TEE/DCCV COMPLICATIONS:     There were no immediate complications.   FINDINGS:  Moderate reduced LV function Mild MR +PFO Negative study for LAA thrombus   RECOMMENDATIONS:      Continue anticoagulation Antimicrobials:  Anti-infectives (From admission, onward)    None        Subjective: Seen and examined at bedside after his TEE/DCCV and he is doing okay.  States she is a little sleepy and wanting some food.  No other concerns or complaints at this time.  Denies any chest pain or lightheadedness or dizziness.  Thinks he is doing a bit better.  Objective: Vitals:   02/11/23 1107 02/11/23 1111 02/11/23 1119 02/11/23 1122  BP:  96/78 95/77 (P) 95/81  Pulse: 73     Resp:      Temp: 100.1 F (37.8 C)     TempSrc: Temporal      SpO2:      Weight:      Height:        Intake/Output Summary (Last 24 hours) at 02/11/2023 1312 Last data filed at 02/11/2023 1052 Gross per 24 hour  Intake 1191.92 ml  Output 450 ml  Net 741.92 ml   Filed Weights   02/08/23 1619 02/11/23 0339  Weight: 89.8 kg 89.9 kg   Examination: Physical Exam:  Constitutional: Overweight African-American male in no acute distress appears calm and a little somnolent and drowsy Respiratory: Diminished to auscultation bilaterally, no wheezing, rales, rhonchi or crackles. Normal respiratory effort and patient is not tachypenic. No accessory muscle use.  Unlabored breathing Cardiovascular: RRR, no murmurs / rubs / gallops. S1 and S2 auscultated.  1+ lower extremity edema Abdomen: Soft, non-tender, distended secondary to body habitus. Bowel sounds positive.  GU: Deferred. Musculoskeletal: No clubbing / cyanosis  of digits/nails. No joint deformity upper and lower extremities.  Skin: No rashes, lesions, ulcers on a limited skin evaluation. No induration; Warm and dry.  Neurologic: CN 2-12 grossly intact with no focal deficits. Romberg sign cerebellar reflexes not assessed.  Psychiatric: Normal judgment and insight. A little sleepy and drowsy but easily arousable  Data Reviewed: I have personally reviewed following labs and imaging studies  CBC: Recent Labs  Lab 02/08/23 1639 02/09/23 0222 02/10/23 0455 02/11/23 0439  WBC 8.2 9.2 8.2 7.7  NEUTROABS 7.0  --  6.0 5.4  HGB 9.9* 10.3* 9.3* 9.1*  HCT 31.2* 32.8* 28.7* 28.4*  MCV 98.1 98.8 95.7 95.3  PLT 172 165 160 175   Basic Metabolic Panel: Recent Labs  Lab 02/08/23 1639 02/09/23 0222 02/10/23 0455 02/11/23 0439  NA 142 139 134* 135  K 3.7 4.3 3.4* 3.8  CL 100 98 93* 95*  CO2 27 27 27 30   GLUCOSE 102* 90 209* 105*  BUN 15 14 16 19   CREATININE 1.49* 1.36* 1.40* 1.47*  CALCIUM 8.8* 8.6* 8.1* 8.0*  MG  --  1.5* 1.2* 1.9  PHOS  --  3.4 3.6 3.7   GFR: Estimated Creatinine Clearance:  56.5 mL/min (A) (by C-G formula based on SCr of 1.47 mg/dL (H)). Liver Function Tests: Recent Labs  Lab 02/08/23 1639 02/09/23 0222 02/10/23 0455 02/10/23 0500 02/11/23 0439  AST 21 30  --  25 19  ALT 13 15  --  10 11  ALKPHOS 60 62  --  50 57  BILITOT 1.3* 1.8*  --  1.3* 1.1  PROT 7.2 7.1  --  6.2* 6.3*  ALBUMIN  3.3* 3.1* 2.6* 2.6* 2.5*   No results for input(s): LIPASE, AMYLASE in the last 168 hours. Recent Labs  Lab 02/08/23 1921  AMMONIA 37*   Coagulation Profile: Recent Labs  Lab 02/08/23 1639  INR 1.3*   Cardiac Enzymes: Recent Labs  Lab 02/09/23 0222  CKTOTAL 37*   BNP (last 3 results) No results for input(s): PROBNP in the last 8760 hours. HbA1C: No results for input(s): HGBA1C in the last 72 hours. CBG: No results for input(s): GLUCAP in the last 168 hours. Lipid Profile: No results for input(s): CHOL, HDL, LDLCALC, TRIG, CHOLHDL, LDLDIRECT in the last 72 hours. Thyroid Function Tests: Recent Labs    02/09/23 0222  TSH 2.743   Anemia Panel: Recent Labs    02/09/23 0222  VITAMINB12 432  FOLATE 32.6  FERRITIN 305  TIBC 237*  IRON  18*  RETICCTPCT 1.0   Sepsis Labs: Recent Labs  Lab 02/08/23 1649 02/08/23 1855 02/09/23 0222  PROCALCITON  --   --  0.35  LATICACIDVEN 1.4 1.1  --    Recent Results (from the past 240 hours)  Resp panel by RT-PCR (RSV, Flu A&B, Covid) Anterior Nasal Swab     Status: None   Collection Time: 02/08/23  4:39 PM   Specimen: Anterior Nasal Swab  Result Value Ref Range Status   SARS Coronavirus 2 by RT PCR NEGATIVE NEGATIVE Final   Influenza A by PCR NEGATIVE NEGATIVE Final   Influenza B by PCR NEGATIVE NEGATIVE Final    Comment: (NOTE) The Xpert Xpress SARS-CoV-2/FLU/RSV plus assay is intended as an aid in the diagnosis of influenza from Nasopharyngeal swab specimens and should not be used as a sole basis for treatment. Nasal washings and aspirates are unacceptable for Xpert Xpress  SARS-CoV-2/FLU/RSV testing.  Fact Sheet for Patients: bloggercourse.com  Fact Sheet for Healthcare Providers:  seriousbroker.it  This test is not yet approved or cleared by the [place]  FDA and has been authorized for detection and/or diagnosis of SARS-CoV-2 by FDA under an Emergency Use Authorization (EUA). This EUA will remain in effect (meaning this test can be used) for the duration of the COVID-19 declaration under Section 564(b)(1) of the Act, 21 U.S.C. section 360bbb-3(b)(1), unless the authorization is terminated or revoked.     Resp Syncytial Virus by PCR NEGATIVE NEGATIVE Final    Comment: (NOTE) Fact Sheet for Patients: bloggercourse.com  Fact Sheet for Healthcare Providers: seriousbroker.it  This test is not yet approved or cleared by the [place]  FDA and has been authorized for detection and/or diagnosis of SARS-CoV-2 by FDA under an Emergency Use Authorization (EUA). This EUA will remain in effect (meaning this test can be used) for the duration of the COVID-19 declaration under Section 564(b)(1) of the Act, 21 U.S.C. section 360bbb-3(b)(1), unless the authorization is terminated or revoked.  Performed at [facility], [address] [place], [place] [address]   Culture, blood (routine x 2)     Status: None (Preliminary result)   Collection Time: 02/08/23  4:39 PM   Specimen: BLOOD LEFT HAND  Result Value Ref Range Status   Specimen Description BLOOD LEFT HAND  Final   Special Requests   Final    BOTTLES DRAWN AEROBIC AND ANAEROBIC Blood Culture results may not be optimal due to an inadequate volume of blood received in culture bottles   Culture   Final    NO GROWTH 3 DAYS Performed at [facility], [address], [place], [place] [address]    Report Status PENDING  Incomplete  Culture, blood (routine x 2)     Status: None (Preliminary  result)   Collection Time: 02/08/23  4:39 PM   Specimen: BLOOD LEFT FOREARM  Result Value Ref Range Status   Specimen Description BLOOD LEFT FOREARM  Final   Special Requests   Final    BOTTLES DRAWN AEROBIC AND ANAEROBIC Blood Culture results may not be optimal due to an inadequate volume of blood received in culture bottles   Culture   Final    NO GROWTH 3 DAYS Performed at [facility], [address], [place], [place] [address]    Report Status PENDING  Incomplete  Respiratory (~20 pathogens) panel by PCR     Status: Abnormal   Collection Time: 02/08/23  4:39 PM   Specimen: Nasopharyngeal Swab; Respiratory  Result Value Ref Range Status   Adenovirus NOT DETECTED NOT DETECTED Final   Coronavirus 229E NOT DETECTED NOT DETECTED Final    Comment: (NOTE) The Coronavirus on the Respiratory Panel, DOES NOT test for the novel  Coronavirus (2019 nCoV)    Coronavirus HKU1 NOT DETECTED NOT DETECTED Final   Coronavirus NL63 NOT DETECTED NOT DETECTED Final   Coronavirus OC43 DETECTED (A) NOT DETECTED Final   Metapneumovirus NOT DETECTED NOT DETECTED Final   Rhinovirus / Enterovirus NOT DETECTED NOT DETECTED Final   Influenza A NOT DETECTED NOT DETECTED Final   Influenza B NOT DETECTED NOT DETECTED Final   Parainfluenza Virus 1 NOT DETECTED NOT DETECTED Final   Parainfluenza Virus 2 NOT DETECTED NOT DETECTED Final   Parainfluenza Virus 3 NOT DETECTED NOT DETECTED Final   Parainfluenza Virus 4 NOT DETECTED NOT DETECTED Final   Respiratory Syncytial Virus NOT DETECTED NOT DETECTED Final   Bordetella pertussis NOT DETECTED NOT DETECTED Final   Bordetella Parapertussis  NOT DETECTED NOT DETECTED Final   Chlamydophila pneumoniae NOT DETECTED NOT DETECTED Final   Mycoplasma pneumoniae NOT DETECTED NOT DETECTED Final    Comment: Performed at [facility], [address] [place], [place] [address]    Radiology Studies: ECHO TEE Result Date: 02/11/2023    TRANSESOPHOGEAL ECHO REPORT    Patient Name:   DAMETRIUS SANJUAN Date of Exam: 02/11/2023 Medical Rec #:  [id number]        Height:       70.0 in Accession #:    [id number]       Weight:       198.2 lb Date of Birth:  1960-10-16        BSA:          2.079 m Patient Age:    63 years         BP:           91/71 mmHg Patient Gender: M                HR:           104 bpm. Exam Location:  Inpatient Procedure: Transesophageal Echo, Color Doppler and Cardiac Doppler Indications:    Cardioversion  History:        Patient has prior history of Echocardiogram examinations, most                 recent 02/09/2023.  Sonographer:    Tinnie Gosling RDCS Referring Phys: [phone] MARY E BRANCH IMPRESSIONS  1. Left ventricular ejection fraction, by estimation, is 30 to 35%. The left ventricle has moderately decreased function.  2. No left atrial/left atrial appendage thrombus was detected.  3. Mild mitral valve regurgitation.  4. Tricuspid valve regurgitation is mild to moderate.  5. Aortic valve regurgitation is trivial.  6. There is a moderately sized patent foramen ovale with predominantly right to left shunting across the atrial septum. Conclusion(s)/Recommendation(s): No LA/LAA thrombus identified. Negative bubble study for interatrial shunt. No intracardiac source of embolism detected on this on this transesophageal echocardiogram. FINDINGS  Left Ventricle: Left ventricular ejection fraction, by estimation, is 30 to 35%. The left ventricle has moderately decreased function. Left Atrium: No left atrial/left atrial appendage thrombus was detected. Mitral Valve: Mild mitral valve regurgitation. Tricuspid Valve: Tricuspid valve regurgitation is mild to moderate. Aortic Valve: Aortic valve regurgitation is trivial. Pulmonic Valve: Pulmonic valve regurgitation is trivial. IAS/Shunts: A moderately sized patent foramen ovale is detected with predominantly right to left shunting across the atrial septum. Ronal Ross Electronically signed by Ronal Ross Signature Date/Time:  02/11/2023/11:32:41 AM    Final    EP STUDY Result Date: 02/11/2023 See surgical note for result.  DG CHEST PORT 1 VIEW Result Date: 02/11/2023 CLINICAL DATA:  Shortness of breath. EXAM: PORTABLE CHEST 1 VIEW COMPARISON:  Radiographs 02/08/2023 and 01/11/2023.  CT 12/31/2022. FINDINGS: 0602 hours. Persistent low lung volumes. The heart size and mediastinal contours are stable. No significant change in small bilateral pleural effusions and associated bibasilar airspace opacities. No evidence of pneumothorax or acute osseous abnormality. Telemetry leads overlie the chest. IMPRESSION: No significant change in small bilateral pleural effusions and associated bibasilar airspace opacities. Electronically Signed   By: Elsie Perone M.D.   On: 02/11/2023 10:02   Scheduled Meds:  amiodarone   150 mg Intravenous Once   apixaban   5 mg Oral BID   feeding supplement  237 mL Oral BID BM   furosemide   40 mg Intravenous BID  guaiFENesin   600 mg Oral BID   metoprolol  tartrate  25 mg Oral BID   midodrine   15 mg Oral TID WC   nicotine   21 mg Transdermal Daily   pantoprazole   40 mg Oral BID   sodium chloride  flush  3 mL Intravenous Q12H   thiamine   100 mg Oral Daily   Continuous Infusions:  amiodarone  30 mg/hr (02/11/23 1258)    LOS: 2 days   Alejandro Marker, DO [facility] Available via Epic secure chat 7am-7pm After these hours, please refer to coverage provider listed on [email or web address] 02/11/2023, 1:12 PM

## 2023-02-11 NOTE — Interval H&P Note (Signed)
 History and Physical Interval Note:  02/11/2023 9:52 AM  Justin Fleming  has presented today for surgery, with the diagnosis of afib.  The various methods of treatment have been discussed with the patient and family. After consideration of risks, benefits and other options for treatment, the patient has consented to  Procedure(s): TRANSESOPHAGEAL ECHOCARDIOGRAM (N/A) CARDIOVERSION (N/A) as a surgical intervention.  The patient's history has been reviewed, patient examined, no change in status, stable for surgery.  I have reviewed the patient's chart and labs.  Questions were answered to the patient's satisfaction.     Alvan Ronal BRAVO

## 2023-02-11 NOTE — CV Procedure (Signed)
 INDICATIONS: Atrial Fibrillation  PROCEDURE:   Informed consent was obtained prior to the procedure. The risks, benefits and alternatives for the procedure were discussed and the patient comprehended these risks.  Risks include, but are not limited to, cough, sore throat, vomiting, nausea, somnolence, esophageal and stomach trauma or perforation, bleeding, low blood pressure, aspiration, pneumonia, infection, trauma to the teeth and death.    After a procedural time-out, the oropharynx was anesthetized with 20% benzocaine  spray.   During this procedure the patient was administered propofol  (see anesthesia notes) to achieve and maintain moderate conscious sedation.  The patient's heart rate, blood pressure, and oxygen saturationweare monitored continuously during the procedure. The period of conscious sedation was 15 minutes, of which I was present face-to-face 100% of this time.  The transesophageal probe was inserted in the esophagus and stomach without difficulty and multiple views were obtained.  The patient was kept under observation until the patient left the procedure room.  The patient left the procedure room in stable condition.   Agitated microbubble saline contrast was not administered.  COMPLICATIONS:    There were no immediate complications.  FINDINGS:  Moderate reduced LV function Mild MR +PFO Negative study for LAA thrombus  RECOMMENDATIONS:     Continue anticoagulation  Time Spent Directly with the Patient:  15 minutes     Procedure: Electrical Cardioversion Indications:  Atrial Fibrillation  Procedure Details:  Consent: Risks of procedure as well as the alternatives and risks of each were explained to the (patient/caregiver).  Consent for procedure obtained.  Time Out: Verified patient identification, verified procedure, site/side was marked, verified correct patient position, special equipment/implants available, medications/allergies/relevent history  reviewed, required imaging and test results available. PERFORMED.  Patient placed on cardiac monitor, pulse oximetry, supplemental oxygen as necessary.  Sedation given: see anesthesia notes Pacer pads placed anterior and posterior chest.  Cardioverted 1 time(s).  Cardioversion with synchronized biphasic 360J shock.  Evaluation: Findings: Post procedure EKG shows: NSR Complications: None Patient did tolerate procedure well.  Time Spent Directly with the Patient:  15 minutes   Justin Fleming 02/11/2023, 10:54 AM

## 2023-02-11 NOTE — Progress Notes (Addendum)
 Heart Failure Stewardship Pharmacist Progress Note   PCP: Kayla Jeoffrey RAMAN, FNP PCP-Cardiologist: Jayson Sierras, MD    HPI:  Justin Fleming is 71 YOM with PMH CHF, AF, HTN, gout, alcohol abuse, and depression  Patient recently had been admitted to a detox facility prior to visiting his PCP to establish care. At the visit with PCP on 2/3, patient demonstrated DOE, tachycardic, and febrile. EKG obtained showed AF RVR prompting referral to ED. Upon arrival, pt states he had been experiencing SOB for 1-2 weeks with chills and weakness. He denied chest pain or palpitations. He was alert and oriented with no reported changes in appetite. Physical examination showed 3-4+ bilateral LEE to mid thigh, JVD to earlobe, and reduced breathing sounds observed. Labs obtained BNP 570.9, flat troponin, lactate 1.4, Scr 1.49, BUN 15, Na 142, K 3.7. CXR showed bilateral atelectasis in lung bases, cardiac enlargement, small R pleural effusion. He was given lasix  40 mg IV x1 and started on amiodarone  IV gtt. ECHO on 2/4 LVEF 30-35% (prev 55-60%,12/2022), LV global hypokinesis, moderately reduced RV function, moderate bilateral enlargement, mild-moderate MV regurgitation.  Today the patient states that he is feeling better but still appears altered. RN reports that his mental status post-DCCV is unchanged from this morning. He reports that his SOB is improved. Patient still requiring supplemental oxygen via [place] at 3L of O2. He denies lightheadedness or dizziness. States that he had a full breakfast before going to his doctor's appointment and his appetite has remained unchanged. LEE remains at 3+ bilaterally. His toes and fingers are cool but his legs are warm to the touch. Discussed the use of IV diuresis to optimize his volume status. Patient underwent TEE/DCCV this morning with persistent AF RVR, and was converted to NSR with HR 70s. BP has remained soft ~90/70 requiring midodrine  15 mg TID.    Current HF  Medications: Diuretic: furosemide  40 mg IV BID Beta Blocker: metoprolol  tartrate 25 mg BID Other: midodrine  15 mg TID  Prior to admission HF Medications: Diuretic: furosemide  40 mg oral daily  Pertinent Lab Values: Serum creatinine 1.47, BUN 19, Potassium 3.8, Sodium 135, Magnesium  1.9 2/3: BNP 570.9  Vital Signs: Weight: 198 lbs (admission weight: 198 lbs) Blood pressure: ~90/75 mmHg  Heart rate: 110-120 bpm  I/O: net -1.47 L yesterday; net -2.5 L since admission  Medication Assistance / Insurance Benefits Check: Does the patient have prescription insurance?  Yes Type of insurance plan: Medicare Advantage   Does the patient qualify for medication assistance through manufacturers or grants?   Pending  Outpatient Pharmacy:  Prior to admission outpatient [facility] [place] [place] on [address] Needs Is the patient willing to use [facility] TOC pharmacy at discharge? Pending Is the patient willing to transition their outpatient pharmacy to utilize a [facility] outpatient pharmacy?   Pending    Assessment: 1. 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 NYHA class 2-3 symptoms. - Patient is hypervolemic upon exam with 3+ pitting LEE and SOB requiring supplemental oxygen. Strict I/Os and daily weights. - Continue diuresis with furosemide  40 mg IV BID - Recommend holding BB with concerns for low output and low BP requiring midodrine  - Hold RAASi, MRA, and SGLT2i with low BP requiring midodrine  and poor renal function. -Low serum K (3.8) and Mag (1.9), need to replace both   Plan: 1) Medication changes recommended at this time: - Stop metoprolol  tartrate - Give Kcl PO 20  mEq - Give Mag sulfate IV 2 grams  2) Patient assistance: - Patient has Medicare Advantage plan - Plan to discuss prescription access with patient prior to discharge  3)  Education  - To be completed prior to discharge  Signe Dawn, PharmD PGY2  Cardiology Pharmacy Resident Heart Failure Stewardship Phone [phone]

## 2023-02-11 NOTE — Progress Notes (Signed)
 Heart Failure Nurse Navigator Progress Note  PCP: Kayla Jeoffrey RAMAN, FNP PCP-Cardiologist: [place]ber[place] Admission Diagnosis: Atrial Fibrillation Admitted from: PCP via EMS  Presentation:   Justin Fleming presented with A-fib with RVR , rates  150-200's from his PCP office.Reported being short of breath for about 1 -2 weeks, BLE edema + 3-4 up to his mid thigh,  BNP 570.9, reports of continuous alcohol consumption, TEE / DCCV 02/11/23 successful conversion to NSR.Post procedure had low urine output, some AMS, and hypotensive. PICC placed for start of amiodarone  and Co-Ox. Plan for a R/ L heart cath 02/18/23 by Dr. Gardenia. Showed Normal to mildly elevated per and post capillary filling pressure, Normal cardiac output and index.  Plan for potential cardiac MRI on Monday.    Patient was educated on the sign and symptoms of heart failure, daily weights, when to call his doctor or go to the ED. Diet/ fluid restrictions, salt / soda, taking all medications asp prescribed and attending all medical appointments. Patient verbalized his understanding, a HF TOC appointment was scheduled for 03/01/2023 @ 8:45 am.      ECHO/ LVEF: 30-35%  Clinical Course:  Past Medical History:  Diagnosis Date   Alcohol abuse    Rehab 2016   Anxiety    Atrial fibrillation (HCC)    Depression    GERD (gastroesophageal reflux disease)    Gout    Headache    Hypertension      Social History   Socioeconomic History   Marital status: Single    Spouse name: Not on file   Number of children: Not on file   Years of education: Not on file   Highest education level: Not on file  Occupational History   Not on file  Tobacco Use   Smoking status: Former    Types: Cigars   Smokeless tobacco: Never   Tobacco comments:    quit x 5 months  Substance and Sexual Activity   Alcohol use: Yes    Alcohol/week: 2.0 - 3.0 standard drinks of alcohol    Types: 2 - 3 Shots of liquor per week    Comment: 2-3 shots liquor each  evening   Drug use: No   Sexual activity: Not Currently  Other Topics Concern   Not on file  Social History Narrative   Not on file   Social Drivers of Health   Financial Resource Strain: Not on file  Food Insecurity: No Food Insecurity (02/08/2023)   Hunger Vital Sign    Worried About Running Out of Food in the Last Year: Never true    Ran Out of Food in the Last Year: Never true  Transportation Needs: No Transportation Needs (02/08/2023)   PRAPARE - Administrator, Civil Service (Medical): No    Lack of Transportation (Non-Medical): No  Physical Activity: Not on file  Stress: Not on file  Social Connections: Socially Isolated (02/08/2023)   Social Connection and Isolation Panel [NHANES]    Frequency of Communication with Friends and Family: More than three times a week    Frequency of Social Gatherings with Friends and Family: Never    Attends Religious Services: Never    Database Administrator or Organizations: No    Attends Engineer, Structural: Never    Marital Status: Never married   Education Assessment and Provision:  Detailed education and instructions provided on heart failure disease management including the following:  Signs and symptoms of Heart Failure When to call  the physician Importance of daily weights Low sodium diet Fluid restriction Medication management Anticipated future follow-up appointments  Patient education given on each of the above topics.  Patient acknowledges understanding via teach back method and acceptance of all instructions.  Education Materials:  Living Better With Heart Failure Booklet, HF zone tool, & Daily Weight Tracker Tool.  Patient has scale at home: yes Patient has pill box at home: NA    High Risk Criteria for Readmission and/or Poor Patient Outcomes: Heart failure hospital admissions (last 6 months): 0  No Show rate: 0 Difficult social situation: No Demonstrates medication adherence: No, non  compliance history Primary Language: English Literacy level: Reading, writing, and comprehension   Barriers of Care:   Medication compliance Diet/ fluid restrictions, daily weights  Considerations/Referrals:   Referral made to Heart Failure Pharmacist Stewardship: yes Referral made to Heart Failure CSW/NCM TOC: NA Referral made to Heart & Vascular TOC clinic: yes, 03/01/2023 @ 8:45 am.   Items for Follow-up on DC/TOC: Medication compliance Diet/ fluid restrictions/ daily weights Continued New HF education    Stephane Haddock, BSN, RN Heart Failure Print Production Planner Chat Only

## 2023-02-11 NOTE — Interval H&P Note (Signed)
 History and Physical Interval Note:  02/11/2023 9:13 AM  Justin Fleming  has presented today for surgery, with the diagnosis of afib.  The various methods of treatment have been discussed with the patient and family. After consideration of risks, benefits and other options for treatment, the patient has consented to  Procedure(s): TRANSESOPHAGEAL ECHOCARDIOGRAM (N/A) CARDIOVERSION (N/A) as a surgical intervention.  The patient's history has been reviewed, patient examined, no change in status, stable for surgery.  I have reviewed the patient's chart and labs.  Questions were answered to the patient's satisfaction.     Alvan Ronal BRAVO

## 2023-02-11 NOTE — Progress Notes (Signed)
   02/10/23 2329  Vitals  Temp 98.6 F (37 C)  Temp Source Oral  BP (!) 88/65  MAP (mmHg) 74  BP Location Right Arm  BP Method Automatic  Patient Position (if appropriate) Lying  Pulse Rate (!) 101  Pulse Rate Source Monitor  ECG Heart Rate (!) 101  Resp 16  MEWS COLOR  MEWS Score Color Yellow  Oxygen Therapy  SpO2 99 %  O2 Device Nasal Cannula  MEWS Score  MEWS Temp 0  MEWS Systolic 1  MEWS Pulse 1  MEWS RR 0  MEWS LOC 0  MEWS Score 2  Provider Notification  Provider Name/Title T. OPYD  Date Provider Notified 02/10/23  Time Provider Notified 2345  Method of Notification Page [facility] CHAT)  Notification Reason Other (Comment) (SBP 80s)  Provider response No new orders  Date of Provider Response 02/10/23  Time of Provider Response 2350

## 2023-02-11 NOTE — Transfer of Care (Signed)
 Immediate Anesthesia Transfer of Care Note  Patient: Cowan Pilar Rozycki  Procedure(s) Performed: TRANSESOPHAGEAL ECHOCARDIOGRAM CARDIOVERSION  Patient Location: PACU and Cath Lab  Anesthesia Type:MAC  Level of Consciousness: drowsy  Airway & Oxygen Therapy: Patient Spontanous Breathing and Patient connected to nasal cannula oxygen  Post-op Assessment: Report given to RN and Post -op Vital signs reviewed and unstable, Anesthesiologist notified  Post vital signs: Reviewed  Preop SBP in the 80s. PACU BP 78/62. Dr. Maryclare notified and phenylephrine  restarted   Last Vitals:  Vitals Value Taken Time  BP 78/62   Temp    Pulse 71   Resp 17   SpO2 97     Last Pain:  Vitals:   02/11/23 1000  TempSrc:   PainSc: 0-No pain         Complications: No notable events documented.

## 2023-02-11 NOTE — Telephone Encounter (Signed)
 Patient Product/process development scientist completed.    The patient is insured through [facility]. Patient has Medicare and is not eligible for a copay card, but may be able to apply for patient assistance or Medicare RX Payment Plan (Patient Must reach out to their plan, if eligible for payment plan), if available.    Ran test claim for Farxiga 10 mg and the current 30 day co-pay is $47.00.  Ran test claim for Jardiance 10 mg and the current 30 day co-pay is $47.00.  This test claim was processed through [facility]- copay amounts may vary at other pharmacies due to pharmacy/plan contracts, or as the patient moves through the different stages of their insurance plan.     Roland Earl, CPHT Pharmacy Technician III Certified Patient Advocate [facility] Pharmacy Patient Advocate Team Direct Number: [phone]  Fax: [phone]

## 2023-02-11 NOTE — Progress Notes (Addendum)
 Rounding Note    Patient Name: Justin Fleming Date of Encounter: 02/11/2023  [facility] Cardiologist: Jayson Sierras, MD   Subjective   No acute overnight events. He underwent successful TEE/ DCCV this morning. He is maintaining sinus rhythm with rates in the 70s. He denies any chest pain or shortness of breath. BP is soft 90/75 (MAP 82) and is on Midodrine  15mg  three times daily. He denies any lightheadedness/ dizziness.   Of note, pharmacy team reached out to us  with concerns that patient was altered and had cool extremities on exam.He is somnolent but will wake up and answer questions and follows commands. He was able to tell me that he had his heart shocked this morning. Upon entering the room, his extremities were warm on my exam. His right hand did feel a little cool when I rechecked prior to leaving but remainder of extremities were warm.  Inpatient Medications    Scheduled Meds:  amiodarone   150 mg Intravenous Once   apixaban   5 mg Oral BID   feeding supplement  237 mL Oral BID BM   furosemide   40 mg Intravenous BID   guaiFENesin   600 mg Oral BID   metoprolol  tartrate  25 mg Oral BID   midodrine   15 mg Oral TID WC   nicotine   21 mg Transdermal Daily   pantoprazole   40 mg Oral BID   sodium chloride  flush  3 mL Intravenous Q12H   thiamine   100 mg Oral Daily   Continuous Infusions:  amiodarone  30 mg/hr (02/11/23 1258)   PRN Meds: acetaminophen  **OR** acetaminophen , HYDROcodone -acetaminophen , ondansetron  **OR** ondansetron  (ZOFRAN ) IV, sodium chloride  flush   Vital Signs    Vitals:   02/11/23 1107 02/11/23 1111 02/11/23 1119 02/11/23 1122  BP:  96/78 95/77 (P) 95/81  Pulse: 73     Resp:      Temp: 100.1 F (37.8 C)     TempSrc: Temporal     SpO2:      Weight:      Height:        Intake/Output Summary (Last 24 hours) at 02/11/2023 1352 Last data filed at 02/11/2023 1052 Gross per 24 hour  Intake 1191.92 ml  Output 450 ml  Net 741.92 ml       02/11/2023    3:39 AM 02/08/2023    4:19 PM 02/08/2023    2:33 PM  Last 3 Weights  Weight (lbs) 198 lb 3.1 oz 198 lb 198 lb  Weight (kg) 89.9 kg 89.812 kg 89.812 kg      Telemetry    Normal sinus rhythm with rates in the 70s. - Personally Reviewed  ECG    Normal sinus rhythm, rate 70 bpm, with no acute ischemic changes. - Personally Reviewed  Physical Exam   GEN: No acute distress.   Neck: JVD elevated. Cardiac: RRR. No murmurs, rubs, or gallops.  Respiratory: No increased work of breathing. Decreased breath sounds in bilateral bases with mild bibasilar crackles.   GI: Soft and non-tender. MS: 2+ pitting edema of bilateral lower extremities. No deformity. Skin: Warm and dry. Neuro:  No focal deficits. Psych: Normal affect. Responds appropriately.  Labs    High Sensitivity Troponin:   Recent Labs  Lab 02/08/23 1639 02/08/23 1844 02/08/23 2304 02/09/23 0222  TROPONINIHS 49* 53* 68* 67*     Chemistry Recent Labs  Lab 02/09/23 0222 02/10/23 0455 02/10/23 0500 02/11/23 0439  NA 139 134*  --  135  K 4.3 3.4*  --  3.8  CL 98 93*  --  95*  CO2 27 27  --  30  GLUCOSE 90 209*  --  105*  BUN 14 16  --  19  CREATININE 1.36* 1.40*  --  1.47*  CALCIUM 8.6* 8.1*  --  8.0*  MG 1.5* 1.2*  --  1.9  PROT 7.1  --  6.2* 6.3*  ALBUMIN  3.1* 2.6* 2.6* 2.5*  AST 30  --  25 19  ALT 15  --  10 11  ALKPHOS 62  --  50 57  BILITOT 1.8*  --  1.3* 1.1  GFRNONAA 58* 56*  --  53*  ANIONGAP 14 14  --  10    Lipids No results for input(s): CHOL, TRIG, HDL, LABVLDL, LDLCALC, CHOLHDL in the last 168 hours.  Hematology Recent Labs  Lab 02/09/23 0222 02/10/23 0455 02/11/23 0439  WBC 9.2 8.2 7.7  RBC 3.32*  3.35* 3.00* 2.98*  HGB 10.3* 9.3* 9.1*  HCT 32.8* 28.7* 28.4*  MCV 98.8 95.7 95.3  MCH 31.0 31.0 30.5  MCHC 31.4 32.4 32.0  RDW 19.4* 18.9* 19.0*  PLT 165 160 175   Thyroid  Recent Labs  Lab 02/09/23 0222  TSH 2.743    BNP Recent Labs  Lab 02/08/23 1639   BNP 570.9*    DDimer No results for input(s): DDIMER in the last 168 hours.   Radiology    ECHO TEE Result Date: 02/11/2023    TRANSESOPHOGEAL ECHO REPORT   Patient Name:   MITHRAN STRIKE Date of Exam: 02/11/2023 Medical Rec #:  [id number]        Height:       70.0 in Accession #:    [id number]       Weight:       198.2 lb Date of Birth:  06-28-1960        BSA:          2.079 m Patient Age:    63 years         BP:           91/71 mmHg Patient Gender: M                HR:           104 bpm. Exam Location:  Inpatient Procedure: Transesophageal Echo, Color Doppler and Cardiac Doppler Indications:    Cardioversion  History:        Patient has prior history of Echocardiogram examinations, most                 recent 02/09/2023.  Sonographer:    Tinnie Gosling RDCS Referring Phys: [phone] MARY E BRANCH IMPRESSIONS  1. Left ventricular ejection fraction, by estimation, is 30 to 35%. The left ventricle has moderately decreased function.  2. No left atrial/left atrial appendage thrombus was detected.  3. Mild mitral valve regurgitation.  4. Tricuspid valve regurgitation is mild to moderate.  5. Aortic valve regurgitation is trivial.  6. There is a moderately sized patent foramen ovale with predominantly right to left shunting across the atrial septum. Conclusion(s)/Recommendation(s): No LA/LAA thrombus identified. Negative bubble study for interatrial shunt. No intracardiac source of embolism detected on this on this transesophageal echocardiogram. FINDINGS  Left Ventricle: Left ventricular ejection fraction, by estimation, is 30 to 35%. The left ventricle has moderately decreased function. Left Atrium: No left atrial/left atrial appendage thrombus was detected. Mitral Valve: Mild mitral valve regurgitation. Tricuspid Valve: Tricuspid valve regurgitation is mild to moderate.  Aortic Valve: Aortic valve regurgitation is trivial. Pulmonic Valve: Pulmonic valve regurgitation is trivial. IAS/Shunts: A moderately sized patent  foramen ovale is detected with predominantly right to left shunting across the atrial septum. Ronal Ross Electronically signed by Ronal Ross Signature Date/Time: 02/11/2023/11:32:41 AM    Final    EP STUDY Result Date: 02/11/2023 See surgical note for result.  DG CHEST PORT 1 VIEW Result Date: 02/11/2023 CLINICAL DATA:  Shortness of breath. EXAM: PORTABLE CHEST 1 VIEW COMPARISON:  Radiographs 02/08/2023 and 01/11/2023.  CT 12/31/2022. FINDINGS: 0602 hours. Persistent low lung volumes. The heart size and mediastinal contours are stable. No significant change in small bilateral pleural effusions and associated bibasilar airspace opacities. No evidence of pneumothorax or acute osseous abnormality. Telemetry leads overlie the chest. IMPRESSION: No significant change in small bilateral pleural effusions and associated bibasilar airspace opacities. Electronically Signed   By: Elsie Perone M.D.   On: 02/11/2023 10:02    Cardiac Studies   TTE 02/09/2023: Impressions: 1. Left ventricular ejection fraction, by estimation, is 30 to 35%. The  left ventricle has moderately decreased function. The left ventricle  demonstrates global hypokinesis. Left ventricular diastolic function could  not be evaluated.   2. Right ventricular systolic function is moderately reduced. The right  ventricular size is moderately enlarged. Tricuspid regurgitation signal is  inadequate for assessing PA pressure.   3. Left atrial size was moderately dilated.   4. Right atrial size was moderately dilated.   5. The mitral valve is normal in structure. Mild to moderate mitral valve  regurgitation. No evidence of mitral stenosis.   6. The aortic valve is tricuspid. Aortic valve regurgitation is not  visualized. No aortic stenosis is present.  _______________  TEE/ DCCV 02/11/2023: Impressions:  1. Left ventricular ejection fraction, by estimation, is 30 to 35%. The  left ventricle has moderately decreased function.   2. No  left atrial/left atrial appendage thrombus was detected.   3. Mild mitral valve regurgitation.   4. Tricuspid valve regurgitation is mild to moderate.   5. Aortic valve regurgitation is trivial.   6. There is a moderately sized patent foramen ovale with predominantly  right to left shunting across the atrial septum.   Conclusion(s)/Recommendation(s): No LA/LAA thrombus identified. Negative  bubble study for interatrial shunt. No intracardiac source of embolism  detected on this on this transesophageal echocardiogram.    Patient Profile     62 y.o. male with a history of persistent atrial fibrillation (on Amiodarone , Digoxin , and Eliquis ), chronic HFpEF, hypotension on Midodrine , GERD and known gastritis with recent GI bleed in 01/2023, anxiety/ depression, and alcohol abuse who was admitted on 02/08/2023 with atrial fibrillation with RVR and acute on chronic CHF. Echo this admission showed a drop in EF to 30-35%.   Assessment & Plan    Atrial Fibrillation with RVR Initial diagnosed during and admission in 12/2022. He was started on Amiodarone , Digoxin , and Eliquis  at that time. DCCV was not attempted due to alcohol abuse and concern for medication non-compliance. He now presents in rapid atrial fibrillation with rates as high as the 140s to 150s. He was started on IV Amiodarone  and then underwent successful TEE/ DCCV earlier today with restoration of sinus rhythm.  - Maintaining sinus rhythm with rates in the 70s. - Will stop IV Amiodarone  and switch to PO Amiodarone  200mg  twice daily. - Continue Lopressor  25mg  twice daily. Recommend transitioning to Toprol -XL prior to discharge given reduced EF. - Continue Eliquis  5mg  twice daily.  Acute on Chronic HFrEF He was noted to be markedly volume overloaded on admission. BNP elevated at 570. Chest x-ray showed cardiac enlargement with small right pleural effusion. Echo showed LVEF of 30-35% with global hypokinesis, moderate reduced RV function,  moderate biatrial enlargement, and milt to moderate MR. EF down from 50-55% in 12/2022 - likely tachymediated in nature. He is being diuresed with IV Lasix . Documented urinary output of 2.2 yesterday and net negative 2L this admission. Renal function stable. - He is still volume overloaded on exam. BP soft at 90/75 (MAP 82).  - Currently IV Lasix  40mg  twice daily. Unable to up-titrate this due to soft BP.  - Continue Lopressor  25mg  twice daily. Can transition to Toprol -XL prior to discharged. - No other GDMT at this time given hypotension requiring Midodrine . - He still may benefit from a PICC and CO-OX to help guide diuresis and need for additional inotrope support. Will discuss with MD. - Presume cardiomyopathy is secondary to rapid atrial fibrillation. Recommend repeat Echo in 2-3 months after restoration of sinus rhythm. If no improvement in EF at that time, consider ischemic evaluation.   Hypotension Patient has a history of soft BP and was discharged on Midodrine  during last admission in 12/2022. - BP soft but stable. - Continue Midodrine  15mg  three times daily. - He seems to be tolerating low dose Lopressor  okay for now so okay to continue.    Chronic Anemia History of GI Bleed FOBT was positive during last admission in 12/2022. EGD showed gastritis but no signs of bleeding. - Hemoglobin  9.1 today.  - Continue Protonix  40mg  daily. - Management per primary team.  Acute on CKD Stage III Baseline creatinine around 1.1 to 1.2. Creatinine was 1.49 on admission.  - Creatinine stable at 1.47 today. - Continue to monitor closely.    Otherwise, per primary team: - Fever presumed secondary to coronavirus OC43 - Alcohol abuse - Elevated Total Bili  - Alcohol induced fatty liver - Hypoalbuminemia  For questions or updates, please contact  [facility] Please consult [email or web address] for contact info under        Signed, Lunden Mcleish E Tennyson Kallen, PA-C  02/11/2023, 1:52 PM

## 2023-02-11 NOTE — Plan of Care (Signed)
   Problem: Education: Goal: Knowledge of General Education information will improve Description Including pain rating scale, medication(s)/side effects and non-pharmacologic comfort measures Outcome: Progressing

## 2023-02-11 NOTE — Anesthesia Preprocedure Evaluation (Signed)
 Anesthesia Evaluation  Patient identified by MRN, date of birth, ID band Patient awake    Reviewed: Allergy & Precautions, H&P , NPO status , Patient's Chart, lab work & pertinent test results  Airway Mallampati: II   Neck ROM: full    Dental   Pulmonary former smoker Pt has URI with coronavirus.   breath sounds clear to auscultation       Cardiovascular hypertension, +CHF  + dysrhythmias Atrial Fibrillation  Rhythm:irregular Rate:Normal     Neuro/Psych  Headaches PSYCHIATRIC DISORDERS Anxiety Depression       GI/Hepatic ,GERD  ,,  Endo/Other    Renal/GU ARFRenal diseaseCr 1.47     Musculoskeletal   Abdominal   Peds  Hematology  (+) Blood dyscrasia, anemia Hemoglobin 9.1   Anesthesia Other Findings   Reproductive/Obstetrics                             Anesthesia Physical Anesthesia Plan  ASA: 3  Anesthesia Plan: MAC   Post-op Pain Management:    Induction: Intravenous  PONV Risk Score and Plan: 1 and Propofol  infusion and Treatment may vary due to age or medical condition  Airway Management Planned: Nasal Cannula  Additional Equipment:   Intra-op Plan:   Post-operative Plan:   Informed Consent: I have reviewed the patients History and Physical, chart, labs and discussed the procedure including the risks, benefits and alternatives for the proposed anesthesia with the patient or authorized representative who has indicated his/her understanding and acceptance.     Dental advisory given  Plan Discussed with: CRNA, Anesthesiologist and Surgeon  Anesthesia Plan Comments:        Anesthesia Quick Evaluation

## 2023-02-12 ENCOUNTER — Other Ambulatory Visit: Payer: Self-pay

## 2023-02-12 ENCOUNTER — Encounter (HOSPITAL_COMMUNITY): Payer: Self-pay | Admitting: Internal Medicine

## 2023-02-12 DIAGNOSIS — I5043 Acute on chronic combined systolic (congestive) and diastolic (congestive) heart failure: Secondary | ICD-10-CM | POA: Diagnosis not present

## 2023-02-12 DIAGNOSIS — F1010 Alcohol abuse, uncomplicated: Secondary | ICD-10-CM | POA: Diagnosis not present

## 2023-02-12 DIAGNOSIS — I4891 Unspecified atrial fibrillation: Secondary | ICD-10-CM | POA: Diagnosis not present

## 2023-02-12 DIAGNOSIS — R7989 Other specified abnormal findings of blood chemistry: Secondary | ICD-10-CM | POA: Diagnosis not present

## 2023-02-12 DIAGNOSIS — I5023 Acute on chronic systolic (congestive) heart failure: Secondary | ICD-10-CM

## 2023-02-12 DIAGNOSIS — K700 Alcoholic fatty liver: Secondary | ICD-10-CM | POA: Diagnosis not present

## 2023-02-12 LAB — CBC WITH DIFFERENTIAL/PLATELET
Abs Immature Granulocytes: 0.02 10*3/uL (ref 0.00–0.07)
Basophils Absolute: 0 10*3/uL (ref 0.0–0.1)
Basophils Relative: 0 %
Eosinophils Absolute: 0.3 10*3/uL (ref 0.0–0.5)
Eosinophils Relative: 4 %
HCT: 27.9 % — ABNORMAL LOW (ref 39.0–52.0)
Hemoglobin: 8.9 g/dL — ABNORMAL LOW (ref 13.0–17.0)
Immature Granulocytes: 0 %
Lymphocytes Relative: 9 %
Lymphs Abs: 0.7 10*3/uL (ref 0.7–4.0)
MCH: 30.3 pg (ref 26.0–34.0)
MCHC: 31.9 g/dL (ref 30.0–36.0)
MCV: 94.9 fL (ref 80.0–100.0)
Monocytes Absolute: 0.7 10*3/uL (ref 0.1–1.0)
Monocytes Relative: 11 %
Neutro Abs: 5.4 10*3/uL (ref 1.7–7.7)
Neutrophils Relative %: 76 %
Platelets: 190 10*3/uL (ref 150–400)
RBC: 2.94 MIL/uL — ABNORMAL LOW (ref 4.22–5.81)
RDW: 18.7 % — ABNORMAL HIGH (ref 11.5–15.5)
WBC: 7 10*3/uL (ref 4.0–10.5)
nRBC: 0 % (ref 0.0–0.2)

## 2023-02-12 LAB — COMPREHENSIVE METABOLIC PANEL
ALT: 11 U/L (ref 0–44)
AST: 25 U/L (ref 15–41)
Albumin: 2.5 g/dL — ABNORMAL LOW (ref 3.5–5.0)
Alkaline Phosphatase: 72 U/L (ref 38–126)
Anion gap: 13 (ref 5–15)
BUN: 24 mg/dL — ABNORMAL HIGH (ref 8–23)
CO2: 28 mmol/L (ref 22–32)
Calcium: 8.5 mg/dL — ABNORMAL LOW (ref 8.9–10.3)
Chloride: 95 mmol/L — ABNORMAL LOW (ref 98–111)
Creatinine, Ser: 1.46 mg/dL — ABNORMAL HIGH (ref 0.61–1.24)
GFR, Estimated: 53 mL/min — ABNORMAL LOW (ref >60)
Glucose, Bld: 115 mg/dL — ABNORMAL HIGH (ref 70–99)
Potassium: 3.8 mmol/L (ref 3.5–5.1)
Sodium: 136 mmol/L (ref 135–145)
Total Bilirubin: 1.7 mg/dL — ABNORMAL HIGH (ref 0.0–1.2)
Total Protein: 6.3 g/dL — ABNORMAL LOW (ref 6.5–8.1)

## 2023-02-12 LAB — LACTIC ACID, PLASMA
Lactic Acid, Venous: 1.6 mmol/L (ref 0.5–1.9)
Lactic Acid, Venous: 1 mmol/L (ref 0.5–1.9)

## 2023-02-12 LAB — COOXEMETRY PANEL
Carboxyhemoglobin: 2.3 % — ABNORMAL HIGH (ref 0.5–1.5)
Methemoglobin: <0.7 % (ref 0.0–1.5)
O2 Saturation: 54.5 %
Total hemoglobin: 9.5 g/dL — ABNORMAL LOW (ref 12.0–16.0)

## 2023-02-12 LAB — MAGNESIUM: Magnesium: 2 mg/dL (ref 1.7–2.4)

## 2023-02-12 LAB — PHOSPHORUS: Phosphorus: 3.3 mg/dL (ref 2.5–4.6)

## 2023-02-12 MED ORDER — AMIODARONE HCL IN DEXTROSE 360-4.14 MG/200ML-% IV SOLN
30.0000 mg/h | INTRAVENOUS | Status: DC
  Administered 2023-02-12 – 2023-02-17 (×10): 30 mg/h via INTRAVENOUS
  Filled 2023-02-12 (×11): qty 200

## 2023-02-12 MED ORDER — FUROSEMIDE 10 MG/ML IJ SOLN
80.0000 mg | Freq: Two times a day (BID) | INTRAMUSCULAR | Status: DC
  Administered 2023-02-12 – 2023-02-15 (×7): 80 mg via INTRAVENOUS
  Filled 2023-02-12 (×6): qty 8

## 2023-02-12 MED ORDER — FUROSEMIDE 10 MG/ML IJ SOLN
INTRAMUSCULAR | Status: AC
  Filled 2023-02-12: qty 4

## 2023-02-12 MED ORDER — MILRINONE LACTATE IN DEXTROSE 20-5 MG/100ML-% IV SOLN
0.2500 ug/kg/min | INTRAVENOUS | Status: DC
  Administered 2023-02-12 – 2023-02-17 (×9): 0.25 ug/kg/min via INTRAVENOUS
  Filled 2023-02-12 (×9): qty 100

## 2023-02-12 MED ORDER — SODIUM CHLORIDE 0.9% FLUSH
10.0000 mL | Freq: Two times a day (BID) | INTRAVENOUS | Status: DC
  Administered 2023-02-12: 10 mL
  Administered 2023-02-14: 20 mL
  Administered 2023-02-14 – 2023-02-21 (×14): 10 mL

## 2023-02-12 MED ORDER — SODIUM CHLORIDE 0.9% FLUSH: 10.0000 mL | INTRAVENOUS | Status: DC | PRN

## 2023-02-12 NOTE — Plan of Care (Signed)
  Problem: Clinical Measurements: Goal: Will remain free from infection Outcome: Progressing Goal: Diagnostic test results will improve Outcome: Progressing Goal: Respiratory complications will improve Outcome: Progressing Goal: Cardiovascular complication will be avoided Outcome: Progressing   Problem: Activity: Goal: Risk for activity intolerance will decrease Outcome: Progressing   Problem: Coping: Goal: Level of anxiety will decrease Outcome: Progressing   Problem: Elimination: Goal: Will not experience complications related to bowel motility Outcome: Progressing Goal: Will not experience complications related to urinary retention Outcome: Progressing   Problem: Pain Managment: Goal: General experience of comfort will improve and/or be controlled Outcome: Progressing   Problem: Safety: Goal: Ability to remain free from injury will improve Outcome: Progressing   Problem: Skin Integrity: Goal: Risk for impaired skin integrity will decrease Outcome: Progressing

## 2023-02-12 NOTE — Progress Notes (Addendum)
 COOX came back 54.  Seems to be diuresing well on higher dose of IV Lasix  80 mg twice daily.  Discussed with advanced heart failure, starting milrinone  0.25 mg and getting CVPs.   Switching p.o. to IV amiodarone  given milrinone  is proarrhythmic.  Can switch back to p.o. once off milrinone .  Seems to be compensating decently well right now with good output can likely stay on gen cards list for now.

## 2023-02-12 NOTE — Progress Notes (Signed)
 PROGRESS NOTE    Justin Fleming  FMW:[id number] DOB: [birth year] DOA: 02/08/2023 PCP: Kayla Jeoffrey RAMAN, FNP   Brief Narrative:  The patient is a 63 year old AAM with a past medical history significant for but not limited to atrial fibrillation, CHF, gout, GERD, history of alcohol abuse and other comorbidities who continues to drink significant alcohol.  Is admitted with A-fib with RVR tachypnea and worsening edema and echocardiogram revealed left ventricular ejection fraction of 30 to 35%, global hypokinesis of left ventricle and reduced right ventricular function with moderate enlargement.  He also was noted to have worsening renal function.  Currently is getting diuresis and cardiology's been consulted and he is on amiodarone  drip for his uncontrolled heart rates with A-fib with RVR.  Respiratory virus panel was positive for coronavirus OC 43.  He is being diuresed and underwent TEE/DCCV today and has converted to NSR.   Assessment and Plan:  Alcohol Abuse, continuous: -Counseled to quit alcohol. -CIWA Protocol. -Low-dose to try Librium  25 Mg p.o. every 6 hourly x 4 doses and reviewed.   -Patient had remained significantly tachycardic, despite amiodarone  drip but HR's are improving and he is now in NSR and not Tachycardic at all   Chronic Normocytic Anemia -Likely Multifactorial. Hgb/Hct Trend: Recent Labs  Lab 01/14/23 0440 01/15/23 0457 01/16/23 0343 02/08/23 1639 02/09/23 0222 02/10/23 0455 02/11/23 0439  HGB 8.0* 8.4* 8.2* 9.9* 10.3* 9.3* 9.1*  HCT 26.0* 26.1* 26.3* 31.2* 32.8* 28.7* 28.4*  MCV 104.4* 103.2* 103.1* 98.1 98.8 95.7 95.3  -Obtained Anemia Panel with iron  level of 18, UIBC 219, TIBC of 237, saturation ratios of 8%, ferritin low 3-5, folate level 32.6 and a vitamin B12 432 -Transfuse for Hg <7 ,rapidly dropping or  if symptomatic -Continue to monitor for signs and symptoms bleeding; no overt bleeding noted   Atrial Fibrillation with RVR [facility]) s/p TEE/DCCV and  conversion to NSR -Cardiology input is appreciated. -Patient is on amiodarone  drip and being continued. -Cardiology team plans to add beta-blockers and he is now on metoprolol  tartrate 25 mg p.o. twice daily and cardiology recommends transitioning to Toprol -XL prior to discharge given his reduced EF -Continue apixaban . -TSH was 2.743 -Echocardiogram reveals new cardiomyopathy, probably tachycardia induced. -Cardiology is performing TEE/DCCV today and it was done; Findings on TEE showed Moderate reduced LV function, Mild MR,+PFO,Negative study for LAA thrombusand he is now in Sinus Rhythm   Acute on Chronic systolic CHF/Cardiomyopathy: -Cardiology team is directing care. -Currently he remains on amiodarone  and cardiology scheduling him for TEE/DCCV tomorrow -Continues to be markedly volume overloaded and BNP was elevated 570 on admission -Echocardiogram done and showed an LVEF of 30 to 35% with global hypokinesis, moderately reduced right ventricular function, moderate biatrial enlargement and mild to moderate MR; this EF is down from 50 to 55% back in December -Continuing IV Lasix  40 mg twice daily and cardiology is hesitant to increase with his soft blood pressures  Hypokalemia -Patient's K+ Level Trend: Recent Labs  Lab 01/14/23 0440 01/15/23 0457 01/16/23 0343 02/08/23 1639 02/09/23 0222 02/10/23 0455 02/11/23 0439  K 3.2* 3.7 3.4* 3.7 4.3 3.4* 3.8  -Replete with po Kcl 40 mEQ BID x2 yesterday -Continue to Monitor and Replete as Necessary -Repeat CMP in the AM   Hypomagnesemia -Patient's Mag Level Trend: Recent Labs  Lab 01/14/23 0440 01/15/23 0457 02/09/23 0222 02/10/23 0455 02/11/23 0439  MG 1.8 2.0 1.5* 1.2* 1.9  -Replete with IV Mag Sulfate 4 grams yesterday -Continue to Monitor and Replete as  Necessary -Repeat Mag in the AM   AKI vs CKD stage IIIa -BUN/Cr Trend: Recent Labs  Lab 01/14/23 0440 01/15/23 0457 01/16/23 0343 02/08/23 1639 02/09/23 0222  02/10/23 0455 02/11/23 0439  BUN 19 22 22 15 14 16 19   CREATININE 1.07 1.18 0.96 1.49* 1.36* 1.40* 1.47*  -Avoid Nephrotoxic Medications, Contrast Dyes, Hypotension and Dehydration to Ensure Adequate Renal Perfusion and will need to Renally Adjust Meds -Continue to Monitor and Trend Renal Function carefully and repeat CMP in the AM   Hyponatremia, improved and fluctuating  -Mild and likely in the setting of volume overload. Na+ Trend: Recent Labs  Lab 01/14/23 0440 01/15/23 0457 01/16/23 0343 02/08/23 1639 02/09/23 0222 02/10/23 0455 02/11/23 0439  NA 133* 130* 134* 142 139 134* 135  -Continue monitor trend and repeat CMP in a.m.   Elevated Troponin -In the setting of tachycardia -Troponin went from 68 -> 67 -Continue to monitor  -Cardiology is following   Alcohol induced fatty liver -Refer to GI team on discharge.   URI (Upper Respiratory Infection) with Coronavirus (Not COVID) -Respiratory panel significant for Coronavirus OC43.   -Blood cultures pending -Procalcitonin is 0.35.  -Patient is still intermittently been febrile and had SIRS criteria; spiked a temperature of 102.9 last night -Fortunately he has no leukocytosis -Continue with supportive care  Hyperbilirubinemia, improving -Bilirubin Trend: Recent Labs  Lab 02/08/23 1639 02/09/23 0222 02/10/23 0500 02/11/23 0439  BILITOT 1.3* 1.8* 1.3* 1.1  -Continue to Monitor and Trend and Repeat CMP in the AM  Tobacco Abuse -Smoking Cessation counseling given -C/w Nicotine  21 mg TD Patch  Hypoalbuminemia -Patient's Albumin  Trend: Recent Labs  Lab 02/08/23 1639 02/09/23 0222 02/10/23 0455 02/10/23 0500 02/11/23 0439  ALBUMIN  3.3* 3.1* 2.6* 2.6* 2.5*  -Continue to Monitor and Trend and repeat CMP in the AM  Overweight -Complicates overall prognosis and care -Estimated body mass index is 28.44 kg/m as calculated from the following:   Height as of this encounter: 5' 10 (1.778 m).   Weight as of this  encounter: 89.9 kg.  -Weight Loss and Dietary Counseling given   DVT prophylaxis:  apixaban  (ELIQUIS ) tablet 5 mg    Code Status: Full Code Family Communication: No family present at bedside  Disposition Plan:  Level of care: Telemetry Cardiac Status is: Inpatient Remains inpatient appropriate because: Needs further clinical improvement   Consultants:  Cardiology  Procedures:  ECHOCARDIOGRAM IMPRESSIONS     1. Left ventricular ejection fraction, by estimation, is 30 to 35%. The  left ventricle has moderately decreased function. The left ventricle  demonstrates global hypokinesis. Left ventricular diastolic function could  not be evaluated.   2. Right ventricular systolic function is moderately reduced. The right  ventricular size is moderately enlarged. Tricuspid regurgitation signal is  inadequate for assessing PA pressure.   3. Left atrial size was moderately dilated.   4. Right atrial size was moderately dilated.   5. The mitral valve is normal in structure. Mild to moderate mitral valve  regurgitation. No evidence of mitral stenosis.   6. The aortic valve is tricuspid. Aortic valve regurgitation is not  visualized. No aortic stenosis is present.   Comparison(s): Prior images reviewed side by side. The left ventricular  function is worsened.   FINDINGS   Left Ventricle: Mobile echodensity at the apex appears to be a false  tendon. Left ventricular ejection fraction, by estimation, is 30 to 35%.  The left ventricle has moderately decreased function. The left ventricle  demonstrates global hypokinesis.  Definity  contrast agent was given IV to delineate the left ventricular  endocardial borders. The left ventricular internal cavity size was normal  in size. There is borderline concentric left ventricular hypertrophy. Left  ventricular diastolic function  could not be evaluated due to atrial fibrillation. Left ventricular  diastolic function could not be evaluated.    Right Ventricle: The right ventricular size is moderately enlarged. Right  vetricular wall thickness was not well visualized. Right ventricular  systolic function is moderately reduced. Tricuspid regurgitation signal is  inadequate for assessing PA  pressure.   Left Atrium: Left atrial size was moderately dilated.   Right Atrium: Right atrial size was moderately dilated.   Pericardium: There is no evidence of pericardial effusion.   Mitral Valve: The mitral valve is normal in structure. Mild to moderate  mitral valve regurgitation, with centrally-directed jet. No evidence of  mitral valve stenosis. MV peak gradient, 6.9 mmHg. The mean mitral valve  gradient is 3.7 mmHg.   Tricuspid Valve: The tricuspid valve is grossly normal. Tricuspid valve  regurgitation is not demonstrated.   Aortic Valve: The aortic valve is tricuspid. Aortic valve regurgitation is  not visualized. No aortic stenosis is present. Aortic valve mean gradient  measures 3.0 mmHg. Aortic valve peak gradient measures 4.6 mmHg. Aortic  valve area, by VTI measures 2.04  cm.   Pulmonic Valve: The pulmonic valve was not well visualized. Pulmonic valve  regurgitation is not visualized. No evidence of pulmonic stenosis.   Aorta: The aortic root is normal in size and structure.   IAS/Shunts: The interatrial septum was not well visualized.     LEFT VENTRICLE  PLAX 2D  LVIDd:         4.30 cm     Diastology  LVIDs:         3.60 cm     LV e' medial:    5.51 cm/s  LV PW:         1.10 cm     LV E/e' medial:  23.1  LV IVS:        1.20 cm     LV e' lateral:   9.29 cm/s  LVOT diam:     1.90 cm     LV E/e' lateral: 13.7  LV SV:         29  LV SV Index:   14  LVOT Area:     2.84 cm    LV Volumes (MOD)  LV vol d, MOD A2C: 98.5 ml  LV vol d, MOD A4C: 70.6 ml  LV vol s, MOD A2C: 62.5 ml  LV vol s, MOD A4C: 51.3 ml  LV SV MOD A2C:     36.0 ml  LV SV MOD A4C:     70.6 ml  LV SV MOD BP:      26.5 ml   RIGHT  VENTRICLE            IVC  RV Basal diam:  4.50 cm    IVC diam: 2.30 cm  RV S prime:     8.37 cm/s   LEFT ATRIUM              Index        RIGHT ATRIUM           Index  LA diam:        4.20 cm  2.02 cm/m   RA Area:     22.20 cm  LA Vol (A2C):   117.0 ml 56.29  ml/m  RA Volume:   70.70 ml  34.02 ml/m  LA Vol (A4C):   55.2 ml  26.56 ml/m  LA Biplane Vol: 81.1 ml  39.02 ml/m   AORTIC VALVE  AV Area (Vmax):    1.89 cm  AV Area (Vmean):   1.80 cm  AV Area (VTI):     2.04 cm  AV Vmax:           107.43 cm/s  AV Vmean:          79.233 cm/s  AV VTI:            0.141 m  AV Peak Grad:      4.6 mmHg  AV Mean Grad:      3.0 mmHg  LVOT Vmax:         71.57 cm/s  LVOT Vmean:        50.200 cm/s  LVOT VTI:          0.102 m  LVOT/AV VTI ratio: 0.72    AORTA  Ao Root diam: 3.50 cm   MITRAL VALVE  MV Area (PHT): 5.32 cm     SHUNTS  MV Area VTI:   1.80 cm     Systemic VTI:  0.10 m  MV Peak grad:  6.9 mmHg     Systemic Diam: 1.90 cm  MV Mean grad:  3.7 mmHg  MV Vmax:       1.32 m/s  MV Vmean:      88.5 cm/s  MV Decel Time: 143 msec  MR Peak grad: 81.5 mmHg  MR Vmax:      451.33 cm/s  MV E velocity: 127.33 cm/s    TEE/DCCV COMPLICATIONS:     There were no immediate complications.   FINDINGS:  Moderate reduced LV function Mild MR +PFO Negative study for LAA thrombus  Antimicrobials:  Anti-infectives (From admission, onward)    None       Subjective: Seen and examined at bedside is doing okay.  Resting wanting to sleep.  No nausea or vomiting.  Still remained volume overloaded.  No other concerns or complaints at this time and continues to feel short of breath on ambulation and exertion.  No other concerns or complaints this time.  Objective: Vitals:   02/11/23 2348 02/12/23 0542 02/12/23 0804 02/12/23 0930  BP: 90/66 90/66 90/75  101/75  Pulse: 81 75  84  Resp: 18 16 16    Temp: 99.4 F (37.4 C) 97.9 F (36.6 C) 99.1 F (37.3 C)   TempSrc: Oral Oral Oral   SpO2:  99% 99% 100% 99%  Weight:  89.2 kg    Height:        Intake/Output Summary (Last 24 hours) at 02/12/2023 1336 Last data filed at 02/12/2023 1237 Gross per 24 hour  Intake 420 ml  Output 1100 ml  Net -680 ml   Filed Weights   02/08/23 1619 02/11/23 0339 02/12/23 0542  Weight: 89.8 kg 89.9 kg 89.2 kg   Examination: Physical Exam:  Constitutional: WN/WD overweight chronically ill-appearing African-American male who appears calm and in no acute distress Respiratory: Diminished to auscultation bilaterally with some coarse breath sounds and does have some crackles but no appreciable wheezing, rales, rhonchi. Normal respiratory effort and patient is not tachypenic. No accessory muscle use.  Cardiovascular: RRR, no murmurs / rubs / gallops. S1 and S2 auscultated.  Has 1-2+ lower extremity edema.  Abdomen: Soft, non-tender, distended secondary by habitus. Bowel sounds positive.  GU: Deferred. Musculoskeletal: No clubbing /  cyanosis of digits/nails. No joint deformity upper and lower extremities.  Skin: No rashes, lesions, ulcers on a limited skin evaluation. No induration; Warm and dry.  Neurologic: CN 2-12 grossly intact with no focal deficits. Romberg sign and cerebellar reflexes not assessed.  Psychiatric: Normal judgment and insight. Alert and oriented x 3. Normal mood and appropriate affect.   Data Reviewed: I have personally reviewed following labs and imaging studies  CBC: Recent Labs  Lab 02/08/23 1639 02/09/23 0222 02/10/23 0455 02/11/23 0439 02/12/23 0931  WBC 8.2 9.2 8.2 7.7 7.0  NEUTROABS 7.0  --  6.0 5.4 5.4  HGB 9.9* 10.3* 9.3* 9.1* 8.9*  HCT 31.2* 32.8* 28.7* 28.4* 27.9*  MCV 98.1 98.8 95.7 95.3 94.9  PLT 172 165 160 175 190   Basic Metabolic Panel: Recent Labs  Lab 02/08/23 1639 02/09/23 0222 02/10/23 0455 02/11/23 0439 02/12/23 0931  NA 142 139 134* 135  --   K 3.7 4.3 3.4* 3.8  --   CL 100 98 93* 95*  --   CO2 27 27 27 30   --   GLUCOSE 102* 90 209* 105*   --   BUN 15 14 16 19   --   CREATININE 1.49* 1.36* 1.40* 1.47*  --   CALCIUM 8.8* 8.6* 8.1* 8.0*  --   MG  --  1.5* 1.2* 1.9 2.0  PHOS  --  3.4 3.6 3.7 3.3   GFR: Estimated Creatinine Clearance: 56.3 mL/min (A) (by C-G formula based on SCr of 1.47 mg/dL (H)). Liver Function Tests: Recent Labs  Lab 02/08/23 1639 02/09/23 0222 02/10/23 0455 02/10/23 0500 02/11/23 0439  AST 21 30  --  25 19  ALT 13 15  --  10 11  ALKPHOS 60 62  --  50 57  BILITOT 1.3* 1.8*  --  1.3* 1.1  PROT 7.2 7.1  --  6.2* 6.3*  ALBUMIN  3.3* 3.1* 2.6* 2.6* 2.5*   No results for input(s): LIPASE, AMYLASE in the last 168 hours. Recent Labs  Lab 02/08/23 1921  AMMONIA 37*   Coagulation Profile: Recent Labs  Lab 02/08/23 1639  INR 1.3*   Cardiac Enzymes: Recent Labs  Lab 02/09/23 0222  CKTOTAL 37*   BNP (last 3 results) No results for input(s): PROBNP in the last 8760 hours. HbA1C: No results for input(s): HGBA1C in the last 72 hours. CBG: No results for input(s): GLUCAP in the last 168 hours. Lipid Profile: No results for input(s): CHOL, HDL, LDLCALC, TRIG, CHOLHDL, LDLDIRECT in the last 72 hours. Thyroid Function Tests: No results for input(s): TSH, T4TOTAL, FREET4, T3FREE, THYROIDAB in the last 72 hours. Anemia Panel: No results for input(s): VITAMINB12, FOLATE, FERRITIN, TIBC, IRON , RETICCTPCT in the last 72 hours. Sepsis Labs: Recent Labs  Lab 02/08/23 1649 02/08/23 1855 02/09/23 0222 02/12/23 0931  PROCALCITON  --   --  0.35  --   LATICACIDVEN 1.4 1.1  --  1.6   Recent Results (from the past 240 hours)  Resp panel by RT-PCR (RSV, Flu A&B, Covid) Anterior Nasal Swab     Status: None   Collection Time: 02/08/23  4:39 PM   Specimen: Anterior Nasal Swab  Result Value Ref Range Status   SARS Coronavirus 2 by RT PCR NEGATIVE NEGATIVE Final   Influenza A by PCR NEGATIVE NEGATIVE Final   Influenza B by PCR NEGATIVE NEGATIVE Final    Comment:  (NOTE) The Xpert Xpress SARS-CoV-2/FLU/RSV plus assay is intended as an aid in the diagnosis of influenza from  Nasopharyngeal swab specimens and should not be used as a sole basis for treatment. Nasal washings and aspirates are unacceptable for Xpert Xpress SARS-CoV-2/FLU/RSV testing.  Fact Sheet for Patients: bloggercourse.com  Fact Sheet for Healthcare Providers: seriousbroker.it  This test is not yet approved or cleared by the [place]  FDA and has been authorized for detection and/or diagnosis of SARS-CoV-2 by FDA under an Emergency Use Authorization (EUA). This EUA will remain in effect (meaning this test can be used) for the duration of the COVID-19 declaration under Section 564(b)(1) of the Act, 21 U.S.C. section 360bbb-3(b)(1), unless the authorization is terminated or revoked.     Resp Syncytial Virus by PCR NEGATIVE NEGATIVE Final    Comment: (NOTE) Fact Sheet for Patients: bloggercourse.com  Fact Sheet for Healthcare Providers: seriousbroker.it  This test is not yet approved or cleared by the [place]  FDA and has been authorized for detection and/or diagnosis of SARS-CoV-2 by FDA under an Emergency Use Authorization (EUA). This EUA will remain in effect (meaning this test can be used) for the duration of the COVID-19 declaration under Section 564(b)(1) of the Act, 21 U.S.C. section 360bbb-3(b)(1), unless the authorization is terminated or revoked.  Performed at [facility], [address], [place], [place] [address]   Culture, blood (routine x 2)     Status: None (Preliminary result)   Collection Time: 02/08/23  4:39 PM   Specimen: BLOOD LEFT HAND  Result Value Ref Range Status   Specimen Description BLOOD LEFT HAND  Final   Special Requests   Final    BOTTLES DRAWN AEROBIC AND ANAEROBIC Blood Culture results may not be optimal due to an  inadequate volume of blood received in culture bottles   Culture   Final    NO GROWTH 4 DAYS Performed at [facility], [address] [place], [place] [address]    Report Status PENDING  Incomplete  Culture, blood (routine x 2)     Status: None (Preliminary result)   Collection Time: 02/08/23  4:39 PM   Specimen: BLOOD LEFT FOREARM  Result Value Ref Range Status   Specimen Description BLOOD LEFT FOREARM  Final   Special Requests   Final    BOTTLES DRAWN AEROBIC AND ANAEROBIC Blood Culture results may not be optimal due to an inadequate volume of blood received in culture bottles   Culture   Final    NO GROWTH 4 DAYS Performed at [facility], [address], [place], [place] [address]    Report Status PENDING  Incomplete  Respiratory (~20 pathogens) panel by PCR     Status: Abnormal   Collection Time: 02/08/23  4:39 PM   Specimen: Nasopharyngeal Swab; Respiratory  Result Value Ref Range Status   Adenovirus NOT DETECTED NOT DETECTED Final   Coronavirus 229E NOT DETECTED NOT DETECTED Final    Comment: (NOTE) The Coronavirus on the Respiratory Panel, DOES NOT test for the novel  Coronavirus (2019 nCoV)    Coronavirus HKU1 NOT DETECTED NOT DETECTED Final   Coronavirus NL63 NOT DETECTED NOT DETECTED Final   Coronavirus OC43 DETECTED (A) NOT DETECTED Final   Metapneumovirus NOT DETECTED NOT DETECTED Final   Rhinovirus / Enterovirus NOT DETECTED NOT DETECTED Final   Influenza A NOT DETECTED NOT DETECTED Final   Influenza B NOT DETECTED NOT DETECTED Final   Parainfluenza Virus 1 NOT DETECTED NOT DETECTED Final   Parainfluenza Virus 2 NOT DETECTED NOT DETECTED Final   Parainfluenza Virus 3 NOT  DETECTED NOT DETECTED Final   Parainfluenza Virus 4 NOT DETECTED NOT DETECTED Final   Respiratory Syncytial Virus NOT DETECTED NOT DETECTED Final   Bordetella pertussis NOT DETECTED NOT DETECTED Final   Bordetella Parapertussis NOT DETECTED NOT DETECTED Final   Chlamydophila  pneumoniae NOT DETECTED NOT DETECTED Final   Mycoplasma pneumoniae NOT DETECTED NOT DETECTED Final    Comment: Performed at [facility], [address], [place], [place] [address]    Radiology Studies: US  EKG SITE RITE Result Date: 02/12/2023 If [facility] image not attached, placement could not be confirmed due to current cardiac rhythm.  ECHO TEE Result Date: 02/11/2023    TRANSESOPHOGEAL ECHO REPORT   Patient Name:   RAHUL MALINAK Date of Exam: 02/11/2023 Medical Rec #:  [id number]        Height:       70.0 in Accession #:    [id number]       Weight:       198.2 lb Date of Birth:  07/24/1960        BSA:          2.079 m Patient Age:    63 years         BP:           91/71 mmHg Patient Gender: M                HR:           104 bpm. Exam Location:  Inpatient Procedure: Transesophageal Echo, Color Doppler and Cardiac Doppler Indications:    Cardioversion  History:        Patient has prior history of Echocardiogram examinations, most                 recent 02/09/2023.  Sonographer:    Tinnie Gosling RDCS Referring Phys: [phone] MARY E BRANCH IMPRESSIONS  1. Left ventricular ejection fraction, by estimation, is 30 to 35%. The left ventricle has moderately decreased function.  2. No left atrial/left atrial appendage thrombus was detected.  3. Mild mitral valve regurgitation.  4. Tricuspid valve regurgitation is mild to moderate.  5. Aortic valve regurgitation is trivial.  6. There is a moderately sized patent foramen ovale with predominantly right to left shunting across the atrial septum. Conclusion(s)/Recommendation(s): No LA/LAA thrombus identified. Negative bubble study for interatrial shunt. No intracardiac source of embolism detected on this on this transesophageal echocardiogram. FINDINGS  Left Ventricle: Left ventricular ejection fraction, by estimation, is 30 to 35%. The left ventricle has moderately decreased function. Left Atrium: No left atrial/left atrial appendage thrombus was detected. Mitral  Valve: Mild mitral valve regurgitation. Tricuspid Valve: Tricuspid valve regurgitation is mild to moderate. Aortic Valve: Aortic valve regurgitation is trivial. Pulmonic Valve: Pulmonic valve regurgitation is trivial. IAS/Shunts: A moderately sized patent foramen ovale is detected with predominantly right to left shunting across the atrial septum. Ronal Ross Electronically signed by Ronal Ross Signature Date/Time: 02/11/2023/11:32:41 AM    Final    EP STUDY Result Date: 02/11/2023 See surgical note for result.  DG CHEST PORT 1 VIEW Result Date: 02/11/2023 CLINICAL DATA:  Shortness of breath. EXAM: PORTABLE CHEST 1 VIEW COMPARISON:  Radiographs 02/08/2023 and 01/11/2023.  CT 12/31/2022. FINDINGS: 0602 hours. Persistent low lung volumes. The heart size and mediastinal contours are stable. No significant change in small bilateral pleural effusions and associated bibasilar airspace opacities. No evidence of pneumothorax or acute osseous abnormality. Telemetry leads overlie the chest. IMPRESSION: No significant change in  small bilateral pleural effusions and associated bibasilar airspace opacities. Electronically Signed   By: Elsie Perone M.D.   On: 02/11/2023 10:02   Scheduled Meds:  amiodarone   200 mg Oral BID   apixaban   5 mg Oral BID   feeding supplement  237 mL Oral BID BM   furosemide        furosemide   80 mg Intravenous BID   guaiFENesin   600 mg Oral BID   midodrine   15 mg Oral TID WC   nicotine   21 mg Transdermal Daily   pantoprazole   40 mg Oral BID   sodium chloride  flush  10-40 mL Intracatheter Q12H   sodium chloride  flush  3 mL Intravenous Q12H   thiamine   100 mg Oral Daily   Continuous Infusions:   LOS: 3 days   Alejandro Marker, DO [facility] Available via Epic secure chat 7am-7pm After these hours, please refer to coverage provider listed on [email or web address] 02/12/2023, 1:37 PM

## 2023-02-12 NOTE — Anesthesia Postprocedure Evaluation (Signed)
 Anesthesia Post Note  Patient: Justin Fleming  Procedure(s) Performed: TRANSESOPHAGEAL ECHOCARDIOGRAM CARDIOVERSION     Patient location during evaluation: Cath Lab Anesthesia Type: MAC Level of consciousness: awake and alert Pain management: pain level controlled Vital Signs Assessment: post-procedure vital signs reviewed and stable Respiratory status: spontaneous breathing, nonlabored ventilation, respiratory function stable and patient connected to nasal cannula oxygen Cardiovascular status: stable and blood pressure returned to baseline Postop Assessment: no apparent nausea or vomiting Anesthetic complications: no   No notable events documented.  Last Vitals:  Vitals:   02/12/23 0542 02/12/23 0804  BP: 90/66 90/75  Pulse: 75   Resp: 16 16  Temp: 36.6 C 37.3 C  SpO2: 99% 100%    Last Pain:  Vitals:   02/12/23 0804  TempSrc: Oral  PainSc:                  Kyren Vaux S

## 2023-02-12 NOTE — Progress Notes (Signed)
 OT Cancellation Note  Patient Details Name: Justin Fleming MRN: [id number] DOB: [birth year]   Cancelled Treatment:    Reason Eval/Treat Not Completed: Patient at procedure or test/ unavailable.  Attempted skilled OT session.  Pt. Currently having PICC placement. Will check back as able.    CHRISTELLA Nest Lorraine-COTA/L 02/12/2023, 12:56 PM

## 2023-02-12 NOTE — Progress Notes (Addendum)
 Patient Name: Justin Fleming Date of Encounter: 02/12/2023 [facility] Cardiologist: Jayson Sierras, MD   Interval Summary  .    Reports poor urinary output, DOE but at rest feels fine.  No I's and O's for the last 24 hours.  Still maintaining NSR  Vital Signs .    Vitals:   02/11/23 2200 02/11/23 2348 02/12/23 0542 02/12/23 0804  BP: 91/73 90/66 90/66  90/75  Pulse: 77 81 75   Resp:  18 16 16   Temp:  99.4 F (37.4 C) 97.9 F (36.6 C) 99.1 F (37.3 C)  TempSrc:  Oral Oral Oral  SpO2:  99% 99% 100%  Weight:   89.2 kg   Height:        Intake/Output Summary (Last 24 hours) at 02/12/2023 0939 Last data filed at 02/11/2023 2100 Gross per 24 hour  Intake 510 ml  Output --  Net 510 ml      02/12/2023    5:42 AM 02/11/2023    3:39 AM 02/08/2023    4:19 PM  Last 3 Weights  Weight (lbs) 196 lb 10.4 oz 198 lb 3.1 oz 198 lb  Weight (kg) 89.2 kg 89.9 kg 89.812 kg      Telemetry/ECG    Sinus rhythm heart rates in the 70s- Personally Reviewed  CV Studies    Echocardiogram 02/09/2023 1. Left ventricular ejection fraction, by estimation, is 30 to 35%. The  left ventricle has moderately decreased function. The left ventricle  demonstrates global hypokinesis. Left ventricular diastolic function could  not be evaluated.   2. Right ventricular systolic function is moderately reduced. The right  ventricular size is moderately enlarged. Tricuspid regurgitation signal is  inadequate for assessing PA pressure.   3. Left atrial size was moderately dilated.   4. Right atrial size was moderately dilated.   5. The mitral valve is normal in structure. Mild to moderate mitral valve  regurgitation. No evidence of mitral stenosis.   6. The aortic valve is tricuspid. Aortic valve regurgitation is not  visualized. No aortic stenosis is present.   Comparison(s): Prior images reviewed side by side. The left ventricular  function is worsened.    Physical Exam .   GEN: No acute  distress.   Neck: + JVD Cardiac: RRR, no murmurs, rubs, or gallops.  Respiratory: Diffuse crackles GI: Soft, nontender, non-distended  MS: 2+ edema  Patient Profile    Justin Fleming is a 63 y.o. male has hx of persistent atrial fibrillation, chronic HFpEF, hypotension on Midodrine , GERD and known gastritis with recent GI bleed in 01/2023, anxiety/ depression, and alcohol abuse who was admitted on 02/08/2023 with atrial fibrillation with RVR and acute on chronic CHF. Echo this admission showed a drop in EF to 30-35%.   Assessment & Plan .     Persistent A-fib RVR Diagnosed December 2024.  DCCV was previously deferred due to alcohol abuse, issues of noncompliance.  Now underwent TEE/DCCV yesterday with successful conversion to NSR and maintaining Continue Eliquis  5 mg twice daily.  P.o. amiodarone  200 mg twice daily for the next week (started 02/06) and then 200 mg daily thereafter.  New acute HFrEF Possibly tachycardia mediated.  EF here shows new decline 30 to 35% with global hypokinesis, moderately reduced RV function.  Suspect possibly tachycardia mediated.  No improvement despite being in sinus now.  Overtly volume up, narrow pulse pressures, poor diuretic response, slightly worsening renal function.  Concern for low output state, he is warm and seems  to be perfusing however with concomitant coronavirus OC43 though this might be a false assurance. Get PICC line, obtain lactic, Co. ox.  Increase IV Lasix  to 80 mg twice daily.  Low albumin  will also make diuresis difficult. On midodrine  15 mg 3 times daily, BP 90/75.  Beta-blocker stopped. Repeat echocardiogram in 2 to 3 months.  If not improved, can consider ischemic evaluation. No I's and O's, spoke with nurse to ensure this gets done.  Chronic anemia History of GI bleed Previous EGD showing gastritis in December 2024.  Hemoglobin seems to be stable here.  Coronavirus, febrile Alcohol abuse, fatty liver disease Per primary  team   For questions or updates, please contact [facility] Please consult [email or web address] for contact info under        Signed, Thom LITTIE Sluder, PA-C

## 2023-02-12 NOTE — Progress Notes (Signed)
 Heart Failure Stewardship Pharmacist Progress Note   PCP: Kayla Jeoffrey RAMAN, FNP PCP-Cardiologist: Jayson Sierras, MD    HPI:  Justin Fleming is 48 YOM with PMH CHF, AF, HTN, gout, alcohol abuse, and depression  Patient recently had been admitted to a detox facility prior to visiting his PCP to establish care. At the visit with PCP on 2/3, patient demonstrated DOE, tachycardic, and febrile. EKG obtained showed AF RVR prompting referral to ED. Upon arrival, pt states he had been experiencing SOB for 1-2 weeks with chills and weakness. He denied chest pain or palpitations. He was alert and oriented with no reported changes in appetite. Physical examination showed 3-4+ bilateral LEE to mid thigh, JVD to earlobe, and reduced breathing sounds observed. Labs obtained BNP 570.9, flat troponin, lactate 1.4, Scr 1.49, BUN 15, Na 142, K 3.7. CXR showed bilateral atelectasis in lung bases, cardiac enlargement, small R pleural effusion. He was given lasix  40 mg IV x1 and started on amiodarone  IV gtt. ECHO on 2/4 LVEF 30-35% (prev 55-60%,12/2022), LV global hypokinesis, moderately reduced RV function, moderate bilateral enlargement, mild-moderate MV regurgitation.  S/p TEE/DCCV 02/11/23, converted from AF RVR to NSR. BP dropped to ~90/70 mmHg afterwards.  Today the patient states that he is feeling better  still appears altered. He reports that his SOB is improved but still requiring [place] at 3L of O2. He denies lightheadedness or dizziness. States that he had a full breakfast this morning and his appetite has remained unchanged. LEE is slightly improved 2+ bilaterally. His toes and fingers have remained cool but his legs are warm to the touch. This morning POC lactate 1.6. His BP has remains soft ~90/70 mmHg on midodrine  15 mg TID. Plans per cards to place PICC line and check co-ox.   Current HF Medications: Diuretic: furosemide  80 mg IV BID Other: midodrine  15 mg TID  Prior to admission HF  Medications: Diuretic: furosemide  40 mg oral daily  Pertinent Lab Values: POC lactate 1.4, coox pending Serum creatinine 1.47, BUN 19, Potassium 3.8, Sodium 135, Magnesium  2.0 2/3: BNP 570.9  Vital Signs: Weight: 196 lbs (admission weight: 198 lbs) Blood pressure: ~90/70 mmHg  Heart rate: 70-80 bpm I/O: net -2.25 L yesterday; net -2.1 L since admission  Medication Assistance / Insurance Benefits Check: Does the patient have prescription insurance?  Yes Type of insurance plan: Medicare Advantage   Does the patient qualify for medication assistance through manufacturers or grants?   Yes. $1500/month through social security (~$18,000 annually) lives alone with no dependents  Outpatient Pharmacy:  Prior to admission outpatient pharmacy: [facility] [place], [place] on [address] Needs Is the patient willing to use [facility] pharmacy at discharge? Yes Is the patient willing to transition their outpatient pharmacy to utilize a [facility] outpatient pharmacy?   Not at this time, but willing to do so if cost-saving    Assessment: 1. 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 NYHA class 2-3 symptoms. - Patient is hypervolemic upon exam with 2+ pitting LEE and SOB requiring supplemental oxygen. Strict I/Os and daily weights. - Increase diuretic regimen; furosemide  40 to 80 mg IV BID - Continue midodrine  15 mg TID - BP has remained low ~90/70 mmHg - Hold BB, RAASi, MRA, and SGLT2i with low BP requiring midodrine  and poor renal function.   Plan: 1) Medication changes recommended at this time: - Increase furosemide  to 80 mg IV BID - Follow-up co-ox once PICC  line placed   2) Patient assistance: - Patient has Medicare Advantage plan - Plan to discuss prescription access with patient prior to discharge  3)  Education  - To be completed prior to discharge  Signe Dawn, PharmD PGY2 Cardiology Pharmacy Resident Heart Failure  Stewardship Phone [phone]

## 2023-02-12 NOTE — Progress Notes (Signed)
 Peripherally Inserted Central Catheter Placement  The IV Nurse has discussed with the patient and/or persons authorized to consent for the patient, the purpose of this procedure and the potential benefits and risks involved with this procedure.  The benefits include less needle sticks, lab draws from the catheter, and the patient may be discharged home with the catheter. Risks include, but not limited to, infection, bleeding, blood clot (thrombus formation), and puncture of an artery; nerve damage and irregular heartbeat and possibility to perform a PICC exchange if needed/ordered by physician.  Alternatives to this procedure were also discussed.  Bard Power PICC patient education guide, fact sheet on infection prevention and patient information card has been provided to patient /or left at bedside.    PICC Placement Documentation  PICC Double Lumen 02/12/23 Right Basilic 40 cm 0 cm (Active)  Indication for Insertion or Continuance of Line Vasoactive infusions 02/12/23 1325  Exposed Catheter (cm) 0 cm 02/12/23 1325  Site Assessment Clean, Dry, Intact 02/12/23 1325  Lumen #1 Status Flushed;Blood return noted;Saline locked 02/12/23 1325  Lumen #2 Status Flushed;Blood return noted;Saline locked 02/12/23 1325  Dressing Type Transparent 02/12/23 1325  Dressing Status Antimicrobial disc/dressing in place 02/12/23 1325  Line Care Connections checked and tightened 02/12/23 1325  Line Adjustment (NICU/IV Team Only) No 02/12/23 1325  Dressing Intervention New dressing 02/12/23 1325  Dressing Change Due 02/19/23 02/12/23 1325       Deborah Lazcano, [place] Ramos 02/12/2023, 1:27 PM

## 2023-02-12 NOTE — Progress Notes (Signed)
 Physical Therapy Treatment Patient Details Name: Justin Fleming MRN: [id number] DOB: [birth year] Today's Date: 02/12/2023   History of Present Illness 63 y.o. male presented 02/08/23 with tachycardia, febrile, upper respiratory symptoms. +COVID  Afib with RVR   PMH significant of a-fib, CHF, gout, GERD, alcohol abuse.    PT Comments  Session focused on theract to promote functional mobility required for d/c. Pt reports reduced swelling and less pain in bil LEs but the pain in LEs makes moving difficult. Pt is currently mod A x2 when transferring from lower surfaces with RW and requires VC and TC for proper sequencing and hand placement. Pt showed instruction carryover when returning to recliner from standing. Pt's O2  varied throughout the session and was provided 1L O2 by [place] which improved his O2 > 88%. Pt continues to have difficulty with LE weakness, activity tolerance, pain, balance, and gait s/t bil LE pain and edema and will benefit from continued skilled PT to address these deficits. Will continue to follow acutely.    If plan is discharge home, recommend the following: Two people to help with walking and/or transfers;Assistance with cooking/housework;Assist for transportation;Help with stairs or ramp for entrance;A lot of help with bathing/dressing/bathroom   Can travel by private vehicle     No  Equipment Recommendations  None recommended by PT    Recommendations for Other Services       Precautions / Restrictions Precautions Precautions: Fall Precaution Comments: watch HR and SpO2 Restrictions Weight Bearing Restrictions Per Provider Order: No     Mobility  Bed Mobility Overal bed mobility: Needs Assistance Bed Mobility: Supine to Sit           General bed mobility comments: Pt required VC and TC for proper sequencing, hand placement on rail, and initiation. Required additional time to come to EOB. Min A x2 for LE and trunk control    Transfers Overall transfer  level: Needs assistance Equipment used: Rolling walker (2 wheels) Transfers: Sit to/from Stand, Bed to chair/wheelchair/BSC   Stand pivot transfers: Mod assist, From elevated surface, +2 physical assistance         General transfer comment: Pt mod A x2 for sit to stand from recliner with RW and min A x2 when transferring from elevated bed with RW. Pt completed transfer with greatly increased forward trunk flexion and required VC and TC to maintain upright trunk posture and for hip positioning and RW management. Once cueing was removed, pt couldn't maintain upright trunk posture    Ambulation/Gait               General Gait Details: Unable at this time   Stairs             Wheelchair Mobility     Tilt Bed    Modified Rankin (Stroke Patients Only)       Balance                                            Cognition Arousal: Alert Behavior During Therapy: WFL for tasks assessed/performed Overall Cognitive Status: Within Functional Limits for tasks assessed                                          Exercises  General Comments General comments (skin integrity, edema, etc.): Pt's SpO2 varied throughout session. Pt's lowest value was in the low 70s. Changed pulse ox from L finger to R ear and improved sats noted, but still varied greatly from 80s-100. Donned [place] at 1L with sats >/= 92% end of session. RN notified. Pt showed or reported no s/sxs of distress throughout except x1 bout of SOB after standing second bout, but VSS then.      Pertinent Vitals/Pain Pain Assessment Pain Assessment: Faces Faces Pain Scale: Hurts even more Pain Location: bil LEs Pain Descriptors / Indicators: Sore, Grimacing Pain Intervention(s): Monitored during session, Limited activity within patient's tolerance    Home Living                          Prior Function            PT Goals (current goals can now be found in the care  plan section) Acute Rehab PT Goals Patient Stated Goal: decr leg pain PT Goal Formulation: With patient Time For Goal Achievement: 02/23/23 Potential to Achieve Goals: Good Progress towards PT goals: Progressing toward goals    Frequency    Min 1X/week      PT Plan      Co-evaluation              AM-PAC PT 6 Clicks Mobility   Outcome Measure  Help needed turning from your back to your side while in a flat bed without using bedrails?: A Little Help needed moving from lying on your back to sitting on the side of a flat bed without using bedrails?: Total Help needed moving to and from a bed to a chair (including a wheelchair)?: Total Help needed standing up from a chair using your arms (e.g., wheelchair or bedside chair)?: Total Help needed to walk in hospital room?: Total Help needed climbing 3-5 steps with a railing? : Total 6 Click Score: 8    End of Session Equipment Utilized During Treatment: Gait belt;Oxygen Activity Tolerance: Patient limited by fatigue;Patient limited by pain Patient left: in chair;with call bell/phone within reach;with chair alarm set Nurse Communication: Mobility status;Other (comment) (nurse notified regarding varied SpO2 levels during session) PT Visit Diagnosis: Unsteadiness on feet (R26.81);Other abnormalities of gait and mobility (R26.89);Muscle weakness (generalized) (M62.81);Difficulty in walking, not elsewhere classified (R26.2) Pain - Right/Left: Right (Right and Left) Pain - part of body: Knee;Ankle and joints of foot     Time: 8565-8497 PT Time Calculation (min) (ACUTE ONLY): 28 min  Charges:    $Therapeutic Activity: 23-37 mins PT General Charges $$ ACUTE PT VISIT: 1 Visit                     [address], SPT   [place] 02/12/2023, 4:38 PM

## 2023-02-12 NOTE — Plan of Care (Signed)
  Problem: Education: Goal: Knowledge of General Education information will improve Description: Including pain rating scale, medication(s)/side effects and non-pharmacologic comfort measures Outcome: Progressing   Problem: Health Behavior/Discharge Planning: Goal: Ability to manage health-related needs will improve Outcome: Progressing   Problem: Clinical Measurements: Goal: Ability to maintain clinical measurements within normal limits will improve Outcome: Progressing Goal: Will remain free from infection Outcome: Progressing Goal: Diagnostic test results will improve Outcome: Progressing Goal: Respiratory complications will improve Outcome: Progressing Goal: Cardiovascular complication will be avoided Outcome: Progressing   Problem: Activity: Goal: Risk for activity intolerance will decrease Outcome: Progressing   Problem: Nutrition: Goal: Adequate nutrition will be maintained Outcome: Progressing   Problem: Coping: Goal: Level of anxiety will decrease Outcome: Progressing   Problem: Elimination: Goal: Will not experience complications related to bowel motility Outcome: Progressing Goal: Will not experience complications related to urinary retention Outcome: Progressing   Problem: Pain Managment: Goal: General experience of comfort will improve and/or be controlled Outcome: Progressing   Problem: Safety: Goal: Ability to remain free from injury will improve Outcome: Progressing   Problem: Skin Integrity: Goal: Risk for impaired skin integrity will decrease Outcome: Progressing   Problem: Education: Goal: Ability to demonstrate management of disease process will improve Outcome: Progressing Goal: Ability to verbalize understanding of medication therapies will improve Outcome: Progressing Goal: Individualized Educational Video(s) Outcome: Progressing   Problem: Activity: Goal: Capacity to carry out activities will improve Outcome: Progressing    Problem: Cardiac: Goal: Ability to achieve and maintain adequate cardiopulmonary perfusion will improve Outcome: Progressing   Problem: Education: Goal: Knowledge of disease or condition will improve Outcome: Progressing Goal: Understanding of medication regimen will improve Outcome: Progressing Goal: Individualized Educational Video(s) Outcome: Progressing   Problem: Activity: Goal: Ability to tolerate increased activity will improve Outcome: Progressing   Problem: Cardiac: Goal: Ability to achieve and maintain adequate cardiopulmonary perfusion will improve Outcome: Progressing   Problem: Health Behavior/Discharge Planning: Goal: Ability to safely manage health-related needs after discharge will improve Outcome: Progressing

## 2023-02-12 NOTE — TOC Progression Note (Addendum)
 Transition of Care [facility]) - Progression Note    Patient Details  Name: Justin Fleming MRN: [id number] Date of Birth: 1960/04/19  [facility] CM/SW Contact  Isaiah Public, LCSWA Phone Number: 02/12/2023, 2:25 PM  Clinical Narrative:     CSW followed up with patient about his SNF choice. Patient informed CSW that he hasn't had a chance to speak with his sister to discuss SNF bed offers. CSW will follow up with patient for SNF choice. CSW will continue to follow.  Expected Discharge Plan:  (skilled vrs.home agreeable to fax out for snf) Barriers to Discharge: Continued Medical Work up  Expected Discharge Plan and Services In-house Referral: Clinical Social Work     Living arrangements for the past 2 months: Single Family Home                                       Social Determinants of Health (SDOH) Interventions SDOH Screenings   Food Insecurity: No Food Insecurity (02/08/2023)  Housing: Low Risk  (02/08/2023)  Transportation Needs: No Transportation Needs (02/08/2023)  Utilities: Not At Risk (02/08/2023)  Alcohol Screen: Low Risk  (02/02/2018)  Depression (PHQ2-9): Low Risk  (02/02/2018)  Social Connections: Socially Isolated (02/08/2023)  Tobacco Use: Medium Risk (02/08/2023)    Readmission Risk Interventions    01/11/2023   12:44 PM  Readmission Risk Prevention Plan  Transportation Screening Complete  PCP or Specialist Appt within 5-7 Days Not Complete  Home Care Screening Complete  Medication Review (RN CM) Complete

## 2023-02-13 DIAGNOSIS — K700 Alcoholic fatty liver: Secondary | ICD-10-CM | POA: Diagnosis not present

## 2023-02-13 DIAGNOSIS — I5020 Unspecified systolic (congestive) heart failure: Secondary | ICD-10-CM

## 2023-02-13 DIAGNOSIS — F1010 Alcohol abuse, uncomplicated: Secondary | ICD-10-CM | POA: Diagnosis not present

## 2023-02-13 DIAGNOSIS — R7989 Other specified abnormal findings of blood chemistry: Secondary | ICD-10-CM | POA: Diagnosis not present

## 2023-02-13 DIAGNOSIS — I4891 Unspecified atrial fibrillation: Secondary | ICD-10-CM | POA: Diagnosis not present

## 2023-02-13 LAB — CBC WITH DIFFERENTIAL/PLATELET
Abs Immature Granulocytes: 0.04 10*3/uL (ref 0.00–0.07)
Basophils Absolute: 0 10*3/uL (ref 0.0–0.1)
Basophils Relative: 0 %
Eosinophils Absolute: 0.3 10*3/uL (ref 0.0–0.5)
Eosinophils Relative: 4 %
HCT: 24.8 % — ABNORMAL LOW (ref 39.0–52.0)
Hemoglobin: 8.1 g/dL — ABNORMAL LOW (ref 13.0–17.0)
Immature Granulocytes: 1 %
Lymphocytes Relative: 8 %
Lymphs Abs: 0.6 10*3/uL — ABNORMAL LOW (ref 0.7–4.0)
MCH: 30.9 pg (ref 26.0–34.0)
MCHC: 32.7 g/dL (ref 30.0–36.0)
MCV: 94.7 fL (ref 80.0–100.0)
Monocytes Absolute: 0.9 10*3/uL (ref 0.1–1.0)
Monocytes Relative: 12 %
Neutro Abs: 5.8 10*3/uL (ref 1.7–7.7)
Neutrophils Relative %: 75 %
Platelets: 218 10*3/uL (ref 150–400)
RBC: 2.62 MIL/uL — ABNORMAL LOW (ref 4.22–5.81)
RDW: 18.4 % — ABNORMAL HIGH (ref 11.5–15.5)
WBC: 7.7 10*3/uL (ref 4.0–10.5)
nRBC: 0 % (ref 0.0–0.2)

## 2023-02-13 LAB — MAGNESIUM: Magnesium: 1.6 mg/dL — ABNORMAL LOW (ref 1.7–2.4)

## 2023-02-13 LAB — COMPREHENSIVE METABOLIC PANEL
ALT: 12 U/L (ref 0–44)
AST: 21 U/L (ref 15–41)
Albumin: 2.2 g/dL — ABNORMAL LOW (ref 3.5–5.0)
Alkaline Phosphatase: 58 U/L (ref 38–126)
Anion gap: 11 (ref 5–15)
BUN: 23 mg/dL (ref 8–23)
CO2: 32 mmol/L (ref 22–32)
Calcium: 8.3 mg/dL — ABNORMAL LOW (ref 8.9–10.3)
Chloride: 94 mmol/L — ABNORMAL LOW (ref 98–111)
Creatinine, Ser: 1.41 mg/dL — ABNORMAL HIGH (ref 0.61–1.24)
GFR, Estimated: 55 mL/min — ABNORMAL LOW (ref >60)
Glucose, Bld: 120 mg/dL — ABNORMAL HIGH (ref 70–99)
Potassium: 3.2 mmol/L — ABNORMAL LOW (ref 3.5–5.1)
Sodium: 137 mmol/L (ref 135–145)
Total Bilirubin: 1.3 mg/dL — ABNORMAL HIGH (ref 0.0–1.2)
Total Protein: 5.9 g/dL — ABNORMAL LOW (ref 6.5–8.1)

## 2023-02-13 LAB — CULTURE, BLOOD (ROUTINE X 2)
Culture: NO GROWTH
Culture: NO GROWTH

## 2023-02-13 LAB — COOXEMETRY PANEL
Carboxyhemoglobin: 2.8 % — ABNORMAL HIGH (ref 0.5–1.5)
Methemoglobin: 0.9 % (ref 0.0–1.5)
O2 Saturation: 73.4 %
Total hemoglobin: 8.5 g/dL — ABNORMAL LOW (ref 12.0–16.0)

## 2023-02-13 LAB — PHOSPHORUS: Phosphorus: 3.3 mg/dL (ref 2.5–4.6)

## 2023-02-13 MED ORDER — MAGNESIUM SULFATE 2 GM/50ML IV SOLN
2.0000 g | Freq: Once | INTRAVENOUS | Status: AC
  Administered 2023-02-13: 2 g via INTRAVENOUS
  Filled 2023-02-13: qty 50

## 2023-02-13 MED ORDER — POTASSIUM CHLORIDE CRYS ER 20 MEQ PO TBCR
40.0000 meq | EXTENDED_RELEASE_TABLET | Freq: Two times a day (BID) | ORAL | Status: AC
  Administered 2023-02-13 (×2): 40 meq via ORAL
  Filled 2023-02-13 (×2): qty 2

## 2023-02-13 NOTE — Progress Notes (Signed)
 PROGRESS NOTE    Justin Fleming  FMW:[id number] DOB: 1960/05/01 DOA: 02/08/2023 PCP: Kayla Jeoffrey RAMAN, FNP   Brief Narrative:  The patient is a 63 year old AAM with a past medical history significant for but not limited to atrial fibrillation, CHF, gout, GERD, history of alcohol abuse and other comorbidities who continues to drink significant alcohol.  Is admitted with A-fib with RVR tachypnea and worsening edema and echocardiogram revealed left ventricular ejection fraction of 30 to 35%, global hypokinesis of left ventricle and reduced right ventricular function with moderate enlargement.  He also was noted to have worsening renal function.  Currently is getting diuresis and cardiology's been consulted and he is on amiodarone  drip for his uncontrolled heart rates with A-fib with RVR.  Respiratory virus panel was positive for coronavirus OC 43.  He is being diuresed and underwent TEE/DCCV today and has converted to NSR.   He is currently getting diuresis per Cardiology and had PICC line placed. Co-Ox came back at 108 and the medical oncology team discussed with advanced heart failure team who recommended starting milrinone  and getting CVP's.  Assessment and Plan:  Alcohol Abuse, continuous: -Counseled to quit alcohol. -CIWA Protocol was initiated and appears to be out of withdrawals -Low-dose to try Librium  25 Mg p.o. every 6 hourly x 4 doses and reviewed.   -Patient had remained significantly tachycardic, despite amiodarone  drip but HR's are improving and he is now in NSR and not Tachycardic at all   Chronic Normocytic Anemia -Likely Multifactorial. Hgb/Hct Trend: Recent Labs  Lab 01/16/23 0343 02/08/23 1639 02/09/23 0222 02/10/23 0455 02/11/23 0439 02/12/23 0931 02/13/23 0537  HGB 8.2* 9.9* 10.3* 9.3* 9.1* 8.9* 8.1*  HCT 26.3* 31.2* 32.8* 28.7* 28.4* 27.9* 24.8*  MCV 103.1* 98.1 98.8 95.7 95.3 94.9 94.7  -Obtained Anemia Panel with iron  level of 18, UIBC 219, TIBC of 237, saturation  ratios of 8%, ferritin low 3-5, folate level 32.6 and a vitamin B12 432 -Transfuse for Hg <7 ,rapidly dropping or  if symptomatic -Continue to monitor for signs and symptoms bleeding; no overt bleeding noted   Atrial Fibrillation with RVR [facility]) s/p TEE/DCCV and conversion to NSR -Cardiology input is appreciated  -Patient was on amiodarone  drip and transition to oral amiodarone  as below however it has been changed patient back to amiodarone  drip given that he is being started on milrinone  -Cardiology team plans to add beta-blockers and he is now on metoprolol  tartrate 25 mg p.o. twice daily and cardiology recommends transitioning to Toprol -XL prior to discharge given his reduced EF -Continue anticoagulation with Apixaban . -TSH was 2.743 -Echocardiogram reveals new cardiomyopathy, probably tachycardia induced. -Cardiology took patient for TEE/DCCV 02/11/23 and it was done; Findings on TEE showed Moderate reduced LV function, Mild MR,+PFO,Negative study for LAA thrombusand he is now in Sinus Rhythm -He was on a p.o. Taper of Amiodarone  200 mg twice daily for next week and then he will go to 200 mg daily after however he was changed back to IV amiodarone  given that he is being switched to IV Amiodarone  given that the milrinone  drip is proarrhythmic.  Per cardiology recommending switching back to oral once he is off of Milrinone    Acute on Chronic systolic CHF/Cardiomyopathy: -Cardiology team is directing care given that there is concern for low output heart failure.. -He is status post TEE/DCCV -Continues to be markedly volume overloaded and BNP was elevated 570 on admission -Echocardiogram done and showed an LVEF of 30 to 35% with global hypokinesis, moderately reduced right  ventricular function, moderate biatrial enlargement and mild to moderate MR; this EF is down from 50 to 55% back in December  Intake/Output Summary (Last 24 hours) at 02/13/2023 1804 Last data filed at 02/13/2023 1708 Gross per  24 hour  Intake 1413.39 ml  Output 3350 ml  Net -1936.61 ml  -He continues to be volume overloaded so cardiology is increasing his Lasix  dose to 80 mg IV twice daily and they are also recommending placing a PICC line for Co-ox; Given his Low Co-ox he was started on a Milrinone  gtt -Continue further care per Cardiology and they are recommending continuing the IV Lasix  80 mg twice daily with potassium supplementation and midodrine  to support blood pressures and feel that his GDMT is limited otherwise.;  Cardiology recommends no changes to present regimen at this time  Hypokalemia -Patient's K+ Level Trend: Recent Labs  Lab 01/16/23 0343 02/08/23 1639 02/09/23 0222 02/10/23 0455 02/11/23 0439 02/12/23 0930 02/13/23 0537  K 3.4* 3.7 4.3 3.4* 3.8 3.8 3.2*  -Replete with po Kcl 40 mEQ BID x2  -Continue to Monitor and Replete as Necessary -Repeat CMP in the AM   Hypomagnesemia -Patient's Mag Level Trend: Recent Labs  Lab 01/15/23 0457 02/09/23 0222 02/10/23 0455 02/11/23 0439 02/12/23 0931 02/13/23 0537  MG 2.0 1.5* 1.2* 1.9 2.0 1.6*  -Replete with IV Mag Sulfate 2 grams  -Continue to Monitor and Replete as Necessary -Repeat Mag in the AM   AKI vs CKD stage IIIa -BUN/Cr Trend: Recent Labs  Lab 01/16/23 0343 02/08/23 1639 02/09/23 0222 02/10/23 0455 02/11/23 0439 02/12/23 0930 02/13/23 0537  BUN 22 15 14 16 19  24* 23  CREATININE 0.96 1.49* 1.36* 1.40* 1.47* 1.46* 1.41*  -Avoid Nephrotoxic Medications, Contrast Dyes, Hypotension and Dehydration to Ensure Adequate Renal Perfusion and will need to Renally Adjust Meds -Continue to Monitor and Trend Renal Function carefully and repeat CMP in the AM   Hyponatremia, improved and fluctuating  -Mild and likely in the setting of volume overload. Na+ Trend: Recent Labs  Lab 01/16/23 0343 02/08/23 1639 02/09/23 0222 02/10/23 0455 02/11/23 0439 02/12/23 0930 02/13/23 0537  NA 134* 142 139 134* 135 136 137  -Continue  monitor trend and repeat CMP in a.m.   Elevated Troponin -In the setting of tachycardia -Troponin went from 68 -> 67 -Continue to monitor  -Cardiology is following   Alcohol Induced Fatty Liver -Refer to GI team on discharge.   URI (Upper Respiratory Infection) with Coronavirus (Not COVID) -Respiratory panel significant for Coronavirus OC43.   -Blood cultures pending -Procalcitonin is 0.35.  -Patient is still intermittently been febrile and had SIRS criteria; spiked a temperature of 101.5 last night -Fortunately he has no leukocytosis -CXR yesterday done and showed No significant change in small bilateral pleural effusions and associated bibasilar airspace opacities. -Continue with supportive care  Hyperbilirubinemia, improving -Bilirubin Trend: Recent Labs  Lab 02/08/23 1639 02/09/23 0222 02/10/23 0500 02/11/23 0439 02/12/23 0930 02/13/23 0537  BILITOT 1.3* 1.8* 1.3* 1.1 1.7* 1.3*  -Continue to Monitor and Trend and Repeat CMP in the AM  Tobacco Abuse -Smoking Cessation counseling given -C/w Nicotine  21 mg TD Patch  Hypoalbuminemia -Patient's Albumin  Trend: Recent Labs  Lab 02/08/23 1639 02/09/23 0222 02/10/23 0455 02/10/23 0500 02/11/23 0439 02/12/23 0930 02/13/23 0537  ALBUMIN  3.3* 3.1* 2.6* 2.6* 2.5* 2.5* 2.2*  -Continue to Monitor and Trend and repeat CMP in the AM  Overweight -Complicates overall prognosis and care -Estimated body mass index is 27.52 kg/m as calculated from  the following:   Height as of this encounter: 5' 10 (1.778 m).   Weight as of this encounter: 87 kg.  -Weight Loss and Dietary Counseling given   DVT prophylaxis:  apixaban  (ELIQUIS ) tablet 5 mg    Code Status: Full Code Family Communication: No family currently at bedside  Disposition Plan:  Level of care: Telemetry Cardiac Status is: Inpatient Remains inpatient appropriate because: Needs further clinical improvement and clearance by Cardiolgoy    Consultants:   Cardiology  Procedures:  ECHOCARDIOGRAM IMPRESSIONS     1. Left ventricular ejection fraction, by estimation, is 30 to 35%. The  left ventricle has moderately decreased function. The left ventricle  demonstrates global hypokinesis. Left ventricular diastolic function could  not be evaluated.   2. Right ventricular systolic function is moderately reduced. The right  ventricular size is moderately enlarged. Tricuspid regurgitation signal is  inadequate for assessing PA pressure.   3. Left atrial size was moderately dilated.   4. Right atrial size was moderately dilated.   5. The mitral valve is normal in structure. Mild to moderate mitral valve  regurgitation. No evidence of mitral stenosis.   6. The aortic valve is tricuspid. Aortic valve regurgitation is not  visualized. No aortic stenosis is present.   Comparison(s): Prior images reviewed side by side. The left ventricular  function is worsened.   FINDINGS   Left Ventricle: Mobile echodensity at the apex appears to be a false  tendon. Left ventricular ejection fraction, by estimation, is 30 to 35%.  The left ventricle has moderately decreased function. The left ventricle  demonstrates global hypokinesis.  Definity  contrast agent was given IV to delineate the left ventricular  endocardial borders. The left ventricular internal cavity size was normal  in size. There is borderline concentric left ventricular hypertrophy. Left  ventricular diastolic function  could not be evaluated due to atrial fibrillation. Left ventricular  diastolic function could not be evaluated.   Right Ventricle: The right ventricular size is moderately enlarged. Right  vetricular wall thickness was not well visualized. Right ventricular  systolic function is moderately reduced. Tricuspid regurgitation signal is  inadequate for assessing PA  pressure.   Left Atrium: Left atrial size was moderately dilated.   Right Atrium: Right atrial size was  moderately dilated.   Pericardium: There is no evidence of pericardial effusion.   Mitral Valve: The mitral valve is normal in structure. Mild to moderate  mitral valve regurgitation, with centrally-directed jet. No evidence of  mitral valve stenosis. MV peak gradient, 6.9 mmHg. The mean mitral valve  gradient is 3.7 mmHg.   Tricuspid Valve: The tricuspid valve is grossly normal. Tricuspid valve  regurgitation is not demonstrated.   Aortic Valve: The aortic valve is tricuspid. Aortic valve regurgitation is  not visualized. No aortic stenosis is present. Aortic valve mean gradient  measures 3.0 mmHg. Aortic valve peak gradient measures 4.6 mmHg. Aortic  valve area, by VTI measures 2.04  cm.   Pulmonic Valve: The pulmonic valve was not well visualized. Pulmonic valve  regurgitation is not visualized. No evidence of pulmonic stenosis.   Aorta: The aortic root is normal in size and structure.   IAS/Shunts: The interatrial septum was not well visualized.     LEFT VENTRICLE  PLAX 2D  LVIDd:         4.30 cm     Diastology  LVIDs:         3.60 cm     LV e' medial:  5.51 cm/s  LV PW:         1.10 cm     LV E/e' medial:  23.1  LV IVS:        1.20 cm     LV e' lateral:   9.29 cm/s  LVOT diam:     1.90 cm     LV E/e' lateral: 13.7  LV SV:         29  LV SV Index:   14  LVOT Area:     2.84 cm    LV Volumes (MOD)  LV vol d, MOD A2C: 98.5 ml  LV vol d, MOD A4C: 70.6 ml  LV vol s, MOD A2C: 62.5 ml  LV vol s, MOD A4C: 51.3 ml  LV SV MOD A2C:     36.0 ml  LV SV MOD A4C:     70.6 ml  LV SV MOD BP:      26.5 ml   RIGHT VENTRICLE            IVC  RV Basal diam:  4.50 cm    IVC diam: 2.30 cm  RV S prime:     8.37 cm/s   LEFT ATRIUM              Index        RIGHT ATRIUM           Index  LA diam:        4.20 cm  2.02 cm/m   RA Area:     22.20 cm  LA Vol (A2C):   117.0 ml 56.29 ml/m  RA Volume:   70.70 ml  34.02 ml/m  LA Vol (A4C):   55.2 ml  26.56 ml/m  LA Biplane Vol: 81.1 ml   39.02 ml/m   AORTIC VALVE  AV Area (Vmax):    1.89 cm  AV Area (Vmean):   1.80 cm  AV Area (VTI):     2.04 cm  AV Vmax:           107.43 cm/s  AV Vmean:          79.233 cm/s  AV VTI:            0.141 m  AV Peak Grad:      4.6 mmHg  AV Mean Grad:      3.0 mmHg  LVOT Vmax:         71.57 cm/s  LVOT Vmean:        50.200 cm/s  LVOT VTI:          0.102 m  LVOT/AV VTI ratio: 0.72    AORTA  Ao Root diam: 3.50 cm   MITRAL VALVE  MV Area (PHT): 5.32 cm     SHUNTS  MV Area VTI:   1.80 cm     Systemic VTI:  0.10 m  MV Peak grad:  6.9 mmHg     Systemic Diam: 1.90 cm  MV Mean grad:  3.7 mmHg  MV Vmax:       1.32 m/s  MV Vmean:      88.5 cm/s  MV Decel Time: 143 msec  MR Peak grad: 81.5 mmHg  MR Vmax:      451.33 cm/s  MV E velocity: 127.33 cm/s    TEE/DCCV COMPLICATIONS:     There were no immediate complications.   FINDINGS:  Moderate reduced LV function Mild MR +PFO Negative study for LAA thrombus  Antimicrobials:  Anti-infectives (From admission, onward)  None       Subjective: Seen and examined at bedside he is resting and denied any complaints.  States he is urinating quite a bit.  Denies any chest pain or shortness of breath at rest and states he has less swelling now.  No other concerns at this time..  Objective: Vitals:   02/13/23 0340 02/13/23 0850 02/13/23 1231 02/13/23 1546  BP: 98/62 (!) 84/57 96/63 121/85  Pulse: 85  91   Resp: 16 16 20 20   Temp: 98.9 F (37.2 C) 98.8 F (37.1 C) 98.8 F (37.1 C) 98.8 F (37.1 C)  TempSrc: Oral Oral Oral Oral  SpO2: 100%  96%   Weight: 87 kg     Height:        Intake/Output Summary (Last 24 hours) at 02/13/2023 1804 Last data filed at 02/13/2023 1708 Gross per 24 hour  Intake 1413.39 ml  Output 3350 ml  Net -1936.61 ml   Filed Weights   02/11/23 0339 02/12/23 0542 02/13/23 0340  Weight: 89.9 kg 89.2 kg 87 kg   Examination: Physical Exam:  Constitutional: WN/WD overweight chronically ill-appearing  male who appears calm and in no acute distress Respiratory: Diminished to auscultation bilaterally with some coarse breath sounds and does have some slight crackles but no appreciable wheezing, rales or rhonchi.  No accessory muscle use Cardiovascular: RRR, no murmurs / rubs / gallops. S1 and S2 auscultated.  1+ to 2+ extremity pitting edema Abdomen: Soft, non-tender, distended secondary to body habitus. Bowel sounds positive.  GU: Deferred. Musculoskeletal: No clubbing / cyanosis of digits/nails. No joint deformity upper and lower extremities. Skin: No rashes, lesions, ulcers on a limited skin evaluation. No induration; Warm and dry.  Neurologic: CN 2-12 grossly intact with no focal deficits.  Romberg sign and cerebellar reflexes not assessed.  Psychiatric: Normal judgment and insight. Alert and oriented x 3. Normal mood and appropriate affect.   Data Reviewed: I have personally reviewed following labs and imaging studies  CBC: Recent Labs  Lab 02/08/23 1639 02/09/23 0222 02/10/23 0455 02/11/23 0439 02/12/23 0931 02/13/23 0537  WBC 8.2 9.2 8.2 7.7 7.0 7.7  NEUTROABS 7.0  --  6.0 5.4 5.4 5.8  HGB 9.9* 10.3* 9.3* 9.1* 8.9* 8.1*  HCT 31.2* 32.8* 28.7* 28.4* 27.9* 24.8*  MCV 98.1 98.8 95.7 95.3 94.9 94.7  PLT 172 165 160 175 190 218   Basic Metabolic Panel: Recent Labs  Lab 02/09/23 0222 02/10/23 0455 02/11/23 0439 02/12/23 0930 02/12/23 0931 02/13/23 0537  NA 139 134* 135 136  --  137  K 4.3 3.4* 3.8 3.8  --  3.2*  CL 98 93* 95* 95*  --  94*  CO2 27 27 30 28   --  32  GLUCOSE 90 209* 105* 115*  --  120*  BUN 14 16 19  24*  --  23  CREATININE 1.36* 1.40* 1.47* 1.46*  --  1.41*  CALCIUM 8.6* 8.1* 8.0* 8.5*  --  8.3*  MG 1.5* 1.2* 1.9  --  2.0 1.6*  PHOS 3.4 3.6 3.7  --  3.3 3.3   GFR: Estimated Creatinine Clearance: 53.9 mL/min (A) (by C-G formula based on SCr of 1.41 mg/dL (H)). Liver Function Tests: Recent Labs  Lab 02/09/23 0222 02/10/23 0455 02/10/23 0500  02/11/23 0439 02/12/23 0930 02/13/23 0537  AST 30  --  25 19 25 21   ALT 15  --  10 11 11 12   ALKPHOS 62  --  50 57 72 58  BILITOT 1.8*  --  1.3* 1.1 1.7* 1.3*  PROT 7.1  --  6.2* 6.3* 6.3* 5.9*  ALBUMIN  3.1* 2.6* 2.6* 2.5* 2.5* 2.2*   No results for input(s): LIPASE, AMYLASE in the last 168 hours. Recent Labs  Lab 02/08/23 1921  AMMONIA 37*   Coagulation Profile: Recent Labs  Lab 02/08/23 1639  INR 1.3*   Cardiac Enzymes: Recent Labs  Lab 02/09/23 0222  CKTOTAL 37*   BNP (last 3 results) No results for input(s): PROBNP in the last 8760 hours. HbA1C: No results for input(s): HGBA1C in the last 72 hours. CBG: No results for input(s): GLUCAP in the last 168 hours. Lipid Profile: No results for input(s): CHOL, HDL, LDLCALC, TRIG, CHOLHDL, LDLDIRECT in the last 72 hours. Thyroid Function Tests: No results for input(s): TSH, T4TOTAL, FREET4, T3FREE, THYROIDAB in the last 72 hours. Anemia Panel: No results for input(s): VITAMINB12, FOLATE, FERRITIN, TIBC, IRON , RETICCTPCT in the last 72 hours. Sepsis Labs: Recent Labs  Lab 02/08/23 1649 02/08/23 1855 02/09/23 0222 02/12/23 0931 02/12/23 1223  PROCALCITON  --   --  0.35  --   --   LATICACIDVEN 1.4 1.1  --  1.6 1.0    Recent Results (from the past 240 hours)  Resp panel by RT-PCR (RSV, Flu A&B, Covid) Anterior Nasal Swab     Status: None   Collection Time: 02/08/23  4:39 PM   Specimen: Anterior Nasal Swab  Result Value Ref Range Status   SARS Coronavirus 2 by RT PCR NEGATIVE NEGATIVE Final   Influenza A by PCR NEGATIVE NEGATIVE Final   Influenza B by PCR NEGATIVE NEGATIVE Final    Comment: (NOTE) The Xpert Xpress SARS-CoV-2/FLU/RSV plus assay is intended as an aid in the diagnosis of influenza from Nasopharyngeal swab specimens and should not be used as a sole basis for treatment. Nasal washings and aspirates are unacceptable for Xpert Xpress  SARS-CoV-2/FLU/RSV testing.  Fact Sheet for Patients: bloggercourse.com  Fact Sheet for Healthcare Providers: seriousbroker.it  This test is not yet approved or cleared by the [place]  FDA and has been authorized for detection and/or diagnosis of SARS-CoV-2 by FDA under an Emergency Use Authorization (EUA). This EUA will remain in effect (meaning this test can be used) for the duration of the COVID-19 declaration under Section 564(b)(1) of the Act, 21 U.S.C. section 360bbb-3(b)(1), unless the authorization is terminated or revoked.     Resp Syncytial Virus by PCR NEGATIVE NEGATIVE Final    Comment: (NOTE) Fact Sheet for Patients: bloggercourse.com  Fact Sheet for Healthcare Providers: seriousbroker.it  This test is not yet approved or cleared by the [place]  FDA and has been authorized for detection and/or diagnosis of SARS-CoV-2 by FDA under an Emergency Use Authorization (EUA). This EUA will remain in effect (meaning this test can be used) for the duration of the COVID-19 declaration under Section 564(b)(1) of the Act, 21 U.S.C. section 360bbb-3(b)(1), unless the authorization is terminated or revoked.  Performed at [facility], [address] [place], [place] [address]   Culture, blood (routine x 2)     Status: None   Collection Time: 02/08/23  4:39 PM   Specimen: BLOOD LEFT HAND  Result Value Ref Range Status   Specimen Description BLOOD LEFT HAND  Final   Special Requests   Final    BOTTLES DRAWN AEROBIC AND ANAEROBIC Blood Culture results may not be optimal due to an inadequate volume of blood received in culture bottles   Culture   Final  NO GROWTH 5 DAYS Performed at [facility], [address] [place], [place] [address]    Report Status 02/13/2023 FINAL  Final  Culture, blood (routine x 2)     Status: None   Collection Time: 02/08/23   4:39 PM   Specimen: BLOOD LEFT FOREARM  Result Value Ref Range Status   Specimen Description BLOOD LEFT FOREARM  Final   Special Requests   Final    BOTTLES DRAWN AEROBIC AND ANAEROBIC Blood Culture results may not be optimal due to an inadequate volume of blood received in culture bottles   Culture   Final    NO GROWTH 5 DAYS Performed at [facility], [address], [place], [place] [address]    Report Status 02/13/2023 FINAL  Final  Respiratory (~20 pathogens) panel by PCR     Status: Abnormal   Collection Time: 02/08/23  4:39 PM   Specimen: Nasopharyngeal Swab; Respiratory  Result Value Ref Range Status   Adenovirus NOT DETECTED NOT DETECTED Final   Coronavirus 229E NOT DETECTED NOT DETECTED Final    Comment: (NOTE) The Coronavirus on the Respiratory Panel, DOES NOT test for the novel  Coronavirus (2019 nCoV)    Coronavirus HKU1 NOT DETECTED NOT DETECTED Final   Coronavirus NL63 NOT DETECTED NOT DETECTED Final   Coronavirus OC43 DETECTED (A) NOT DETECTED Final   Metapneumovirus NOT DETECTED NOT DETECTED Final   Rhinovirus / Enterovirus NOT DETECTED NOT DETECTED Final   Influenza A NOT DETECTED NOT DETECTED Final   Influenza B NOT DETECTED NOT DETECTED Final   Parainfluenza Virus 1 NOT DETECTED NOT DETECTED Final   Parainfluenza Virus 2 NOT DETECTED NOT DETECTED Final   Parainfluenza Virus 3 NOT DETECTED NOT DETECTED Final   Parainfluenza Virus 4 NOT DETECTED NOT DETECTED Final   Respiratory Syncytial Virus NOT DETECTED NOT DETECTED Final   Bordetella pertussis NOT DETECTED NOT DETECTED Final   Bordetella Parapertussis NOT DETECTED NOT DETECTED Final   Chlamydophila pneumoniae NOT DETECTED NOT DETECTED Final   Mycoplasma pneumoniae NOT DETECTED NOT DETECTED Final    Comment: Performed at [facility], [address], [place], [place] [address]    Radiology Studies: US  EKG SITE RITE Result Date: 02/12/2023 If [facility] image not attached, placement could not be  confirmed due to current cardiac rhythm.  Scheduled Meds:  apixaban   5 mg Oral BID   feeding supplement  237 mL Oral BID BM   furosemide   80 mg Intravenous BID   guaiFENesin   600 mg Oral BID   midodrine   15 mg Oral TID WC   nicotine   21 mg Transdermal Daily   pantoprazole   40 mg Oral BID   potassium chloride   40 mEq Oral BID   sodium chloride  flush  10-40 mL Intracatheter Q12H   sodium chloride  flush  3 mL Intravenous Q12H   thiamine   100 mg Oral Daily   Continuous Infusions:  amiodarone  30 mg/hr (02/13/23 1714)   milrinone  0.25 mcg/kg/min (02/13/23 1713)    LOS: 4 days   Alejandro Marker, DO [facility] Available via Epic secure chat 7am-7pm After these hours, please refer to coverage provider listed on [email or web address] 02/13/2023, 6:04 PM

## 2023-02-13 NOTE — Progress Notes (Signed)
 Progress Note  Patient Name: Justin Fleming Date of Encounter: 02/13/2023  Primary Cardiologist: Jayson Sierras, MD  Interval Summary   Chart reviewed.  Patient states that he feels more comfortable, less tightness in his legs and no shortness of breath at rest.  Vital Signs    Vitals:   02/13/23 0136 02/13/23 0340 02/13/23 0850 02/13/23 1231  BP: 96/63 98/62 (!) 84/57   Pulse: 79 85    Resp:  16 16 20   Temp:  98.9 F (37.2 C) 98.8 F (37.1 C) 98.8 F (37.1 C)  TempSrc:  Oral Oral Oral  SpO2: 99% 100%    Weight:  87 kg    Height:        Intake/Output Summary (Last 24 hours) at 02/13/2023 1239 Last data filed at 02/13/2023 1230 Gross per 24 hour  Intake 1053.39 ml  Output 3400 ml  Net -2346.61 ml   Filed Weights   02/11/23 0339 02/12/23 0542 02/13/23 0340  Weight: 89.9 kg 89.2 kg 87 kg    Physical Exam   GEN: No acute distress.   Neck: No JVD. Cardiac: Indistinct PMI, RRR without gallop.  Respiratory: Nonlabored. Clear to auscultation bilaterally. GI: Soft, nontender, bowel sounds present. MS: Improving lower leg edema. Neuro:  Nonfocal. Psych: Alert and oriented x 3. Normal affect.  ECG/Telemetry    Telemetry reviewed showing sinus rhythm with occasional PACs.  Labs    Chemistry Recent Labs  Lab 02/11/23 0439 02/12/23 0930 02/13/23 0537  NA 135 136 137  K 3.8 3.8 3.2*  CL 95* 95* 94*  CO2 30 28 32  GLUCOSE 105* 115* 120*  BUN 19 24* 23  CREATININE 1.47* 1.46* 1.41*  CALCIUM 8.0* 8.5* 8.3*  PROT 6.3* 6.3* 5.9*  ALBUMIN  2.5* 2.5* 2.2*  AST 19 25 21   ALT 11 11 12   ALKPHOS 57 72 58  BILITOT 1.1 1.7* 1.3*  GFRNONAA 53* 53* 55*  ANIONGAP 10 13 11     Hematology Recent Labs  Lab 02/11/23 0439 02/12/23 0931 02/13/23 0537  WBC 7.7 7.0 7.7  RBC 2.98* 2.94* 2.62*  HGB 9.1* 8.9* 8.1*  HCT 28.4* 27.9* 24.8*  MCV 95.3 94.9 94.7  MCH 30.5 30.3 30.9  MCHC 32.0 31.9 32.7  RDW 19.0* 18.7* 18.4*  PLT 175 190 218   Cardiac Enzymes Recent  Labs  Lab 02/08/23 1639 02/08/23 1844 02/08/23 2304 02/09/23 0222  TROPONINIHS 49* 53* 68* 67*   Lipid Panel     Component Value Date/Time   CHOL 147 12/30/2022 0425   TRIG 59 12/30/2022 0425   HDL 66 12/30/2022 0425   CHOLHDL 2.2 12/30/2022 0425   VLDL 12 12/30/2022 0425   LDLCALC 69 12/30/2022 0425    Cardiac Studies   Echocardiogram 02/09/2023:  1. Left ventricular ejection fraction, by estimation, is 30 to 35%. The  left ventricle has moderately decreased function. The left ventricle  demonstrates global hypokinesis. Left ventricular diastolic function could  not be evaluated.   2. Right ventricular systolic function is moderately reduced. The right  ventricular size is moderately enlarged. Tricuspid regurgitation signal is  inadequate for assessing PA pressure.   3. Left atrial size was moderately dilated.   4. Right atrial size was moderately dilated.   5. The mitral valve is normal in structure. Mild to moderate mitral valve  regurgitation. No evidence of mitral stenosis.   6. The aortic valve is tricuspid. Aortic valve regurgitation is not  visualized. No aortic stenosis is present.   TEE  02/11/2023:  1. Left ventricular ejection fraction, by estimation, is 30 to 35%. The  left ventricle has moderately decreased function.   2. No left atrial/left atrial appendage thrombus was detected.   3. Mild mitral valve regurgitation.   4. Tricuspid valve regurgitation is mild to moderate.   5. Aortic valve regurgitation is trivial.   6. There is a moderately sized patent foramen ovale with predominantly  right to left shunting across the atrial septum.   Assessment & Plan   1.  Persistent atrial fibrillation with RVR in the setting of alcohol abuse history and cardiomyopathy.  CHA2DS2-VASc score is 3.  Status post TEE guided cardioversion and remains on Eliquis  as well as amiodarone .  Maintaining sinus rhythm at this time.  2.  HFrEF, LVEF 30 to 35%, most likely nonischemic  in etiology, perhaps tachycardia induced or secondary to alcohol abuse.  Initial concern for low output and PICC line placed, co-ox up to 73 from 55 on milrinone .  Also on Lasix  80 mg IV twice daily with potassium supplement and midodrine  to support blood pressure.  GDMT has otherwise been limited so far.  Net urine output of approximately 2500 cc last 24 hours.  3.  CKD stage IIIa with AKI.  Creatinine 1.41 with GFR 55 at this time.  4.  URI with coronavirus (non-COVID).  5.  Chronic anemia, multifactorial, does have prior history of GI bleed but not actively.  Hemoglobin 8.1.  Current regimen includes Eliquis , Lasix , midodrine , potassium supplement, IV amiodarone , and milrinone .  He has good urine output and stable renal function, no changes to present regimen at this time.  For questions or updates, please contact [facility] Please consult [email or web address] for contact info under   Signed, Jayson Sierras, MD  02/13/2023, 12:39 PM

## 2023-02-14 DIAGNOSIS — I4891 Unspecified atrial fibrillation: Secondary | ICD-10-CM | POA: Diagnosis not present

## 2023-02-14 DIAGNOSIS — R7989 Other specified abnormal findings of blood chemistry: Secondary | ICD-10-CM | POA: Diagnosis not present

## 2023-02-14 DIAGNOSIS — I5020 Unspecified systolic (congestive) heart failure: Secondary | ICD-10-CM | POA: Diagnosis not present

## 2023-02-14 DIAGNOSIS — K700 Alcoholic fatty liver: Secondary | ICD-10-CM | POA: Diagnosis not present

## 2023-02-14 DIAGNOSIS — F1010 Alcohol abuse, uncomplicated: Secondary | ICD-10-CM | POA: Diagnosis not present

## 2023-02-14 LAB — CBC WITH DIFFERENTIAL/PLATELET
Abs Immature Granulocytes: 0.05 10*3/uL (ref 0.00–0.07)
Basophils Absolute: 0 10*3/uL (ref 0.0–0.1)
Basophils Relative: 1 %
Eosinophils Absolute: 0.4 10*3/uL (ref 0.0–0.5)
Eosinophils Relative: 4 %
HCT: 25.5 % — ABNORMAL LOW (ref 39.0–52.0)
Hemoglobin: 8.3 g/dL — ABNORMAL LOW (ref 13.0–17.0)
Immature Granulocytes: 1 %
Lymphocytes Relative: 10 %
Lymphs Abs: 0.9 10*3/uL (ref 0.7–4.0)
MCH: 30.5 pg (ref 26.0–34.0)
MCHC: 32.5 g/dL (ref 30.0–36.0)
MCV: 93.8 fL (ref 80.0–100.0)
Monocytes Absolute: 1 10*3/uL (ref 0.1–1.0)
Monocytes Relative: 12 %
Neutro Abs: 6.3 10*3/uL (ref 1.7–7.7)
Neutrophils Relative %: 72 %
Platelets: 257 10*3/uL (ref 150–400)
RBC: 2.72 MIL/uL — ABNORMAL LOW (ref 4.22–5.81)
RDW: 18.5 % — ABNORMAL HIGH (ref 11.5–15.5)
WBC: 8.6 10*3/uL (ref 4.0–10.5)
nRBC: 0 % (ref 0.0–0.2)

## 2023-02-14 LAB — COMPREHENSIVE METABOLIC PANEL
ALT: 12 U/L (ref 0–44)
AST: 21 U/L (ref 15–41)
Albumin: 2.2 g/dL — ABNORMAL LOW (ref 3.5–5.0)
Alkaline Phosphatase: 57 U/L (ref 38–126)
Anion gap: 11 (ref 5–15)
BUN: 21 mg/dL (ref 8–23)
CO2: 33 mmol/L — ABNORMAL HIGH (ref 22–32)
Calcium: 8.2 mg/dL — ABNORMAL LOW (ref 8.9–10.3)
Chloride: 91 mmol/L — ABNORMAL LOW (ref 98–111)
Creatinine, Ser: 1.3 mg/dL — ABNORMAL HIGH (ref 0.61–1.24)
GFR, Estimated: >60 mL/min (ref >60)
Glucose, Bld: 112 mg/dL — ABNORMAL HIGH (ref 70–99)
Potassium: 3.8 mmol/L (ref 3.5–5.1)
Sodium: 135 mmol/L (ref 135–145)
Total Bilirubin: 1.3 mg/dL — ABNORMAL HIGH (ref 0.0–1.2)
Total Protein: 5.8 g/dL — ABNORMAL LOW (ref 6.5–8.1)

## 2023-02-14 LAB — PHOSPHORUS: Phosphorus: 3 mg/dL (ref 2.5–4.6)

## 2023-02-14 LAB — COOXEMETRY PANEL
Carboxyhemoglobin: 1.6 % — ABNORMAL HIGH (ref 0.5–1.5)
Methemoglobin: 0.8 % (ref 0.0–1.5)
O2 Saturation: 81.3 %
Total hemoglobin: 8.5 g/dL — ABNORMAL LOW (ref 12.0–16.0)

## 2023-02-14 LAB — MAGNESIUM: Magnesium: 1.7 mg/dL (ref 1.7–2.4)

## 2023-02-14 MED ORDER — MAGNESIUM SULFATE 2 GM/50ML IV SOLN
2.0000 g | Freq: Once | INTRAVENOUS | Status: AC
  Administered 2023-02-14: 2 g via INTRAVENOUS
  Filled 2023-02-14: qty 50

## 2023-02-14 NOTE — Progress Notes (Signed)
 PROGRESS NOTE    Justin Fleming  FMW:[id number] DOB: 18-Mar-1960 DOA: 02/08/2023 PCP: Kayla Jeoffrey RAMAN, FNP   Brief Narrative:  The patient is a 63 year old AAM with a past medical history significant for but not limited to atrial fibrillation, CHF, gout, GERD, history of alcohol abuse and other comorbidities who continues to drink significant alcohol.  Is admitted with A-fib with RVR tachypnea and worsening edema and echocardiogram revealed left ventricular ejection fraction of 30 to 35%, global hypokinesis of left ventricle and reduced right ventricular function with moderate enlargement.  He also was noted to have worsening renal function.  Currently is getting diuresis and cardiology's been consulted and he is on amiodarone  drip for his uncontrolled heart rates with A-fib with RVR.  Respiratory virus panel was positive for coronavirus OC 43.  He is being diuresed and underwent TEE/DCCV today and has converted to NSR.   He was currently getting diuresis per Cardiology and had PICC line placed. Co-Ox came back at 9 and the medical Cardiolog team discussed with advanced heart failure team who recommended starting milrinone  and getting CVP's.  Now he is getting diuresis IV Lasix  80 mg twice daily and is on the low normal drip.  Because he is on neurologic his p.o. amiodarone  was changed back to an amiodarone  drip.  Cardiology recommends continuing diuresis and following up electrolytes and once his fluid status is optimized they are recommending that he can likely wean off of milrinone  and transition to divided doses of Lasix  and oral Amiodarone .  Assessment and Plan:  Alcohol Abuse, continuous: -Counseled to quit alcohol. -CIWA Protocol was initiated and appears to be out of withdrawals -Low-dose to try Librium  25 Mg p.o. every 6 hourly x 4 doses and reviewed.   -Patient had remained significantly tachycardic, despite amiodarone  drip but HR's are improving and he is now in NSR and not Tachycardic  at all   Chronic Normocytic Anemia -Likely Multifactorial. Hgb/Hct Trend: Recent Labs  Lab 02/08/23 1639 02/09/23 0222 02/10/23 0455 02/11/23 0439 02/12/23 0931 02/13/23 0537 02/14/23 0622  HGB 9.9* 10.3* 9.3* 9.1* 8.9* 8.1* 8.3*  HCT 31.2* 32.8* 28.7* 28.4* 27.9* 24.8* 25.5*  MCV 98.1 98.8 95.7 95.3 94.9 94.7 93.8  -Obtained Anemia Panel with iron  level of 18, UIBC 219, TIBC of 237, saturation ratios of 8%, ferritin low 3-5, folate level 32.6 and a vitamin B12 432 -Transfuse for Hg <7 ,rapidly dropping or  if symptomatic -Continue to monitor for signs and symptoms bleeding; no overt bleeding noted   Atrial Fibrillation with RVR [facility]) s/p TEE/DCCV and conversion to NSR -Cardiology input is appreciated  -Patient was on amiodarone  drip and transition to oral amiodarone  as below however it has been changed patient back to amiodarone  drip given that he is being started on milrinone  -Cardiology team plans to add beta-blockers and he is now on metoprolol  tartrate 25 mg p.o. twice daily and cardiology recommends transitioning to Toprol -XL prior to discharge given his reduced EF -Continue anticoagulation with Apixaban  for now. -TSH was 2.743 -Echocardiogram reveals new cardiomyopathy, probably tachycardia induced. -Cardiology took patient for TEE/DCCV 02/11/23 and it was done; Findings on TEE showed Moderate reduced LV function, Mild MR,+PFO,Negative study for LAA thrombusand he is now in Sinus Rhythm -He was on a p.o. Taper of Amiodarone  200 mg twice daily for next week and then he will go to 200 mg daily after however he was changed back to IV amiodarone  given that he is being switched to IV Amiodarone  given that the  milrinone  drip is proarrhythmic.  Per cardiology recommending switching back to oral once he is off of Milrinone    Acute on Chronic systolic CHF/Cardiomyopathy: -Cardiology team is directing care given that there is concern for low output heart failure.. -He is status post  TEE/DCCV -Continues to be markedly volume overloaded and BNP was elevated 570 on admission -Echocardiogram done and showed an LVEF of 30 to 35% with global hypokinesis, moderately reduced right ventricular function, moderate biatrial enlargement and mild to moderate MR; this EF is down from 50 to 55% back in December  Intake/Output Summary (Last 24 hours) at 02/14/2023 1602 Last data filed at 02/14/2023 1538 Gross per 24 hour  Intake 1333.78 ml  Output 2850 ml  Net -1516.22 ml  -He continues to be volume overloaded so cardiology is increasing his Lasix  dose to 80 mg IV twice daily and they are also recommending placing a PICC line for Co-ox; Given his Low Co-ox (54.5) he was started on a Milrinone  gtt; Now Co-Ox is 81.3 -Continue further care per Cardiology and they are recommending continuing the IV Lasix  80 mg twice daily with potassium supplementation and Midodrine  to support blood pressures and feel that his GDMT is limited otherwise.;  Cardiology recommends no changes to present regimen at this time and recommending that once his fluid status is optimized likely we can wean off of milrinone  previously with divided doses of oral Amiodarone   Hypokalemia -Patient's K+ Level Trend: Recent Labs  Lab 02/08/23 1639 02/09/23 0222 02/10/23 0455 02/11/23 0439 02/12/23 0930 02/13/23 0537 02/14/23 0622  K 3.7 4.3 3.4* 3.8 3.8 3.2* 3.8  -Replete with po Kcl 40 mEQ x1 -Continue to Monitor and Replete as Necessary -Repeat CMP in the AM   Hypomagnesemia -Patient's Mag Level Trend: Recent Labs  Lab 02/09/23 0222 02/10/23 0455 02/11/23 0439 02/12/23 0931 02/13/23 0537 02/14/23 0622  MG 1.5* 1.2* 1.9 2.0 1.6* 1.7  -Replete with IV Mag Sulfate 2 grams again -Continue to Monitor and Replete as Necessary -Repeat Mag in the AM   AKI vs CKD stage IIIa, improving  -BUN/Cr Trend: Recent Labs  Lab 02/08/23 1639 02/09/23 0222 02/10/23 0455 02/11/23 0439 02/12/23 0930 02/13/23 0537  02/14/23 0622  BUN 15 14 16 19  24* 23 21  CREATININE 1.49* 1.36* 1.40* 1.47* 1.46* 1.41* 1.30*  -Avoid Nephrotoxic Medications, Contrast Dyes, Hypotension and Dehydration to Ensure Adequate Renal Perfusion and will need to Renally Adjust Meds -Continue to Monitor and Trend Renal Function carefully and repeat CMP in the AM   Hyponatremia, improved and fluctuating  -Mild and likely in the setting of volume overload. Na+ Trend: Recent Labs  Lab 02/08/23 1639 02/09/23 0222 02/10/23 0455 02/11/23 0439 02/12/23 0930 02/13/23 0537 02/14/23 0622  NA 142 139 134* 135 136 137 135  -Continue monitor trend and repeat CMP in a.m.   Elevated Troponin -In the setting of tachycardia -Troponin went from 68 -> 67 -Continue to monitor  -Cardiology is following   Alcohol Induced Fatty Liver -Refer to GI team on discharge.   URI (Upper Respiratory Infection) with Coronavirus (Not COVID) -Respiratory panel significant for Coronavirus OC43.   -Blood cultures pending -Procalcitonin is 0.35.  -Patient is still intermittently been febrile and had SIRS criteria; spiked a temperature of 101.5 the night before last and now T Max was 100  -Fortunately he has no leukocytosis -CXR yesterday done and showed No significant change in small bilateral pleural effusions and associated bibasilar airspace opacities. -Continue with supportive care and repeat CXR in  the AM  Hyperbilirubinemia, improving -Bilirubin Trend: Recent Labs  Lab 02/08/23 1639 02/09/23 0222 02/10/23 0500 02/11/23 0439 02/12/23 0930 02/13/23 0537 02/14/23 0622  BILITOT 1.3* 1.8* 1.3* 1.1 1.7* 1.3* 1.3*  -Continue to Monitor and Trend and Repeat CMP in the AM  Tobacco Abuse -Smoking Cessation counseling given -Discontinue Nicotine  21 mg TD Patch now as patient requesting no longer wanting it  Hypoalbuminemia -Patient's Albumin  Trend: Recent Labs  Lab 02/09/23 0222 02/10/23 0455 02/10/23 0500 02/11/23 0439 02/12/23 0930  02/13/23 0537 02/14/23 0622  ALBUMIN  3.1* 2.6* 2.6* 2.5* 2.5* 2.2* 2.2*  -Continue to Monitor and Trend and repeat CMP in the AM  Overweight -Complicates overall prognosis and care -Estimated body mass index is 27.39 kg/m as calculated from the following:   Height as of this encounter: 5' 10 (1.778 m).   Weight as of this encounter: 86.6 kg.  -Weight Loss and Dietary Counseling given   DVT prophylaxis:  apixaban  (ELIQUIS ) tablet 5 mg    Code Status: Full Code Family Communication: No family currently at bedside  Disposition Plan:  Level of care: Telemetry Cardiac Status is: Inpatient Remains inpatient appropriate because: Needs further clinical improvement and clearance by the cardiology team   Consultants:  Cardiology   Procedures:  ECHOCARDIOGRAM IMPRESSIONS     1. Left ventricular ejection fraction, by estimation, is 30 to 35%. The  left ventricle has moderately decreased function. The left ventricle  demonstrates global hypokinesis. Left ventricular diastolic function could  not be evaluated.   2. Right ventricular systolic function is moderately reduced. The right  ventricular size is moderately enlarged. Tricuspid regurgitation signal is  inadequate for assessing PA pressure.   3. Left atrial size was moderately dilated.   4. Right atrial size was moderately dilated.   5. The mitral valve is normal in structure. Mild to moderate mitral valve  regurgitation. No evidence of mitral stenosis.   6. The aortic valve is tricuspid. Aortic valve regurgitation is not  visualized. No aortic stenosis is present.   Comparison(s): Prior images reviewed side by side. The left ventricular  function is worsened.   FINDINGS   Left Ventricle: Mobile echodensity at the apex appears to be a false  tendon. Left ventricular ejection fraction, by estimation, is 30 to 35%.  The left ventricle has moderately decreased function. The left ventricle  demonstrates global hypokinesis.   Definity  contrast agent was given IV to delineate the left ventricular  endocardial borders. The left ventricular internal cavity size was normal  in size. There is borderline concentric left ventricular hypertrophy. Left  ventricular diastolic function  could not be evaluated due to atrial fibrillation. Left ventricular  diastolic function could not be evaluated.   Right Ventricle: The right ventricular size is moderately enlarged. Right  vetricular wall thickness was not well visualized. Right ventricular  systolic function is moderately reduced. Tricuspid regurgitation signal is  inadequate for assessing PA  pressure.   Left Atrium: Left atrial size was moderately dilated.   Right Atrium: Right atrial size was moderately dilated.   Pericardium: There is no evidence of pericardial effusion.   Mitral Valve: The mitral valve is normal in structure. Mild to moderate  mitral valve regurgitation, with centrally-directed jet. No evidence of  mitral valve stenosis. MV peak gradient, 6.9 mmHg. The mean mitral valve  gradient is 3.7 mmHg.   Tricuspid Valve: The tricuspid valve is grossly normal. Tricuspid valve  regurgitation is not demonstrated.   Aortic Valve: The aortic valve is tricuspid.  Aortic valve regurgitation is  not visualized. No aortic stenosis is present. Aortic valve mean gradient  measures 3.0 mmHg. Aortic valve peak gradient measures 4.6 mmHg. Aortic  valve area, by VTI measures 2.04  cm.   Pulmonic Valve: The pulmonic valve was not well visualized. Pulmonic valve  regurgitation is not visualized. No evidence of pulmonic stenosis.   Aorta: The aortic root is normal in size and structure.   IAS/Shunts: The interatrial septum was not well visualized.     LEFT VENTRICLE  PLAX 2D  LVIDd:         4.30 cm     Diastology  LVIDs:         3.60 cm     LV e' medial:    5.51 cm/s  LV PW:         1.10 cm     LV E/e' medial:  23.1  LV IVS:        1.20 cm     LV e'  lateral:   9.29 cm/s  LVOT diam:     1.90 cm     LV E/e' lateral: 13.7  LV SV:         29  LV SV Index:   14  LVOT Area:     2.84 cm    LV Volumes (MOD)  LV vol d, MOD A2C: 98.5 ml  LV vol d, MOD A4C: 70.6 ml  LV vol s, MOD A2C: 62.5 ml  LV vol s, MOD A4C: 51.3 ml  LV SV MOD A2C:     36.0 ml  LV SV MOD A4C:     70.6 ml  LV SV MOD BP:      26.5 ml   RIGHT VENTRICLE            IVC  RV Basal diam:  4.50 cm    IVC diam: 2.30 cm  RV S prime:     8.37 cm/s   LEFT ATRIUM              Index        RIGHT ATRIUM           Index  LA diam:        4.20 cm  2.02 cm/m   RA Area:     22.20 cm  LA Vol (A2C):   117.0 ml 56.29 ml/m  RA Volume:   70.70 ml  34.02 ml/m  LA Vol (A4C):   55.2 ml  26.56 ml/m  LA Biplane Vol: 81.1 ml  39.02 ml/m   AORTIC VALVE  AV Area (Vmax):    1.89 cm  AV Area (Vmean):   1.80 cm  AV Area (VTI):     2.04 cm  AV Vmax:           107.43 cm/s  AV Vmean:          79.233 cm/s  AV VTI:            0.141 m  AV Peak Grad:      4.6 mmHg  AV Mean Grad:      3.0 mmHg  LVOT Vmax:         71.57 cm/s  LVOT Vmean:        50.200 cm/s  LVOT VTI:          0.102 m  LVOT/AV VTI ratio: 0.72    AORTA  Ao Root diam: 3.50 cm   MITRAL VALVE  MV Area (PHT): 5.32 cm  SHUNTS  MV Area VTI:   1.80 cm     Systemic VTI:  0.10 m  MV Peak grad:  6.9 mmHg     Systemic Diam: 1.90 cm  MV Mean grad:  3.7 mmHg  MV Vmax:       1.32 m/s  MV Vmean:      88.5 cm/s  MV Decel Time: 143 msec  MR Peak grad: 81.5 mmHg  MR Vmax:      451.33 cm/s  MV E velocity: 127.33 cm/s    TEE/DCCV COMPLICATIONS:     There were no immediate complications.   FINDINGS:  Moderate reduced LV function Mild MR +PFO Negative study for LAA thrombus   Antimicrobials:  Anti-infectives (From admission, onward)    None       Subjective: Seen and examined at bedside and endorses doing okay.  Continues to have improved diuresis but still swollen.  Denies any shortness breath or chest pain at this  time.  No other concerns of constipation.  Objective: Vitals:   02/13/23 2352 02/14/23 0546 02/14/23 0801 02/14/23 1309  BP: 92/63 102/66 103/76 100/69  Pulse: 92 93 93 99  Resp: 20 18 18 18   Temp: 98.6 F (37 C) 98.7 F (37.1 C) 98.8 F (37.1 C) 98.7 F (37.1 C)  TempSrc: Oral Oral Oral Oral  SpO2: 96% 96% 98% 95%  Weight:  86.6 kg    Height:        Intake/Output Summary (Last 24 hours) at 02/14/2023 1608 Last data filed at 02/14/2023 1538 Gross per 24 hour  Intake 1333.78 ml  Output 2850 ml  Net -1516.22 ml   Filed Weights   02/12/23 0542 02/13/23 0340 02/14/23 0546  Weight: 89.2 kg 87 kg 86.6 kg   Examination: Physical Exam:  Constitutional: WN/WD overweight chronically ill-appearing African-American male who appears calm in no acute distress Respiratory: Diminished to auscultation bilaterally, no wheezing, rales, rhonchi but does have some crackles. Normal respiratory effort and patient is not tachypenic. No accessory muscle use.  Unlabored breathing Cardiovascular: RRR, no murmurs / rubs / gallops. S1 and S2 auscultated.  1+ to 2+ pitting edema Abdomen: Soft, non-tender, non-distended. Bowel sounds positive.  GU: Deferred. Musculoskeletal: No clubbing / cyanosis of digits/nails. No joint deformity upper and lower extremities.  Skin: No rashes, lesions, ulcers on a limited skin evaluation. No induration; Warm and dry.  Psychiatric: Appears calm and and awake and oriented  Data Reviewed: I have personally reviewed following labs and imaging studies  CBC: Recent Labs  Lab 02/10/23 0455 02/11/23 0439 02/12/23 0931 02/13/23 0537 02/14/23 0622  WBC 8.2 7.7 7.0 7.7 8.6  NEUTROABS 6.0 5.4 5.4 5.8 6.3  HGB 9.3* 9.1* 8.9* 8.1* 8.3*  HCT 28.7* 28.4* 27.9* 24.8* 25.5*  MCV 95.7 95.3 94.9 94.7 93.8  PLT 160 175 190 218 257   Basic Metabolic Panel: Recent Labs  Lab 02/10/23 0455 02/11/23 0439 02/12/23 0930 02/12/23 0931 02/13/23 0537 02/14/23 0622  NA 134* 135  136  --  137 135  K 3.4* 3.8 3.8  --  3.2* 3.8  CL 93* 95* 95*  --  94* 91*  CO2 27 30 28   --  32 33*  GLUCOSE 209* 105* 115*  --  120* 112*  BUN 16 19 24*  --  23 21  CREATININE 1.40* 1.47* 1.46*  --  1.41* 1.30*  CALCIUM 8.1* 8.0* 8.5*  --  8.3* 8.2*  MG 1.2* 1.9  --  2.0 1.6* 1.7  PHOS 3.6 3.7  --  3.3 3.3 3.0   GFR: Estimated Creatinine Clearance: 58.5 mL/min (A) (by C-G formula based on SCr of 1.3 mg/dL (H)). Liver Function Tests: Recent Labs  Lab 02/10/23 0500 02/11/23 0439 02/12/23 0930 02/13/23 0537 02/14/23 0622  AST 25 19 25 21 21   ALT 10 11 11 12 12   ALKPHOS 50 57 72 58 57  BILITOT 1.3* 1.1 1.7* 1.3* 1.3*  PROT 6.2* 6.3* 6.3* 5.9* 5.8*  ALBUMIN  2.6* 2.5* 2.5* 2.2* 2.2*   No results for input(s): LIPASE, AMYLASE in the last 168 hours. Recent Labs  Lab 02/08/23 1921  AMMONIA 37*   Coagulation Profile: Recent Labs  Lab 02/08/23 1639  INR 1.3*   Cardiac Enzymes: Recent Labs  Lab 02/09/23 0222  CKTOTAL 37*   BNP (last 3 results) No results for input(s): PROBNP in the last 8760 hours. HbA1C: No results for input(s): HGBA1C in the last 72 hours. CBG: No results for input(s): GLUCAP in the last 168 hours. Lipid Profile: No results for input(s): CHOL, HDL, LDLCALC, TRIG, CHOLHDL, LDLDIRECT in the last 72 hours. Thyroid Function Tests: No results for input(s): TSH, T4TOTAL, FREET4, T3FREE, THYROIDAB in the last 72 hours. Anemia Panel: No results for input(s): VITAMINB12, FOLATE, FERRITIN, TIBC, IRON , RETICCTPCT in the last 72 hours. Sepsis Labs: Recent Labs  Lab 02/08/23 1649 02/08/23 1855 02/09/23 0222 02/12/23 0931 02/12/23 1223  PROCALCITON  --   --  0.35  --   --   LATICACIDVEN 1.4 1.1  --  1.6 1.0   Recent Results (from the past 240 hours)  Resp panel by RT-PCR (RSV, Flu A&B, Covid) Anterior Nasal Swab     Status: None   Collection Time: 02/08/23  4:39 PM   Specimen: Anterior Nasal Swab  Result  Value Ref Range Status   SARS Coronavirus 2 by RT PCR NEGATIVE NEGATIVE Final   Influenza A by PCR NEGATIVE NEGATIVE Final   Influenza B by PCR NEGATIVE NEGATIVE Final    Comment: (NOTE) The Xpert Xpress SARS-CoV-2/FLU/RSV plus assay is intended as an aid in the diagnosis of influenza from Nasopharyngeal swab specimens and should not be used as a sole basis for treatment. Nasal washings and aspirates are unacceptable for Xpert Xpress SARS-CoV-2/FLU/RSV testing.  Fact Sheet for Patients: bloggercourse.com  Fact Sheet for Healthcare Providers: seriousbroker.it  This test is not yet approved or cleared by the [place]  FDA and has been authorized for detection and/or diagnosis of SARS-CoV-2 by FDA under an Emergency Use Authorization (EUA). This EUA will remain in effect (meaning this test can be used) for the duration of the COVID-19 declaration under Section 564(b)(1) of the Act, 21 U.S.C. section 360bbb-3(b)(1), unless the authorization is terminated or revoked.     Resp Syncytial Virus by PCR NEGATIVE NEGATIVE Final    Comment: (NOTE) Fact Sheet for Patients: bloggercourse.com  Fact Sheet for Healthcare Providers: seriousbroker.it  This test is not yet approved or cleared by the [place]  FDA and has been authorized for detection and/or diagnosis of SARS-CoV-2 by FDA under an Emergency Use Authorization (EUA). This EUA will remain in effect (meaning this test can be used) for the duration of the COVID-19 declaration under Section 564(b)(1) of the Act, 21 U.S.C. section 360bbb-3(b)(1), unless the authorization is terminated or revoked.  Performed at [facility], [address] [place], [place] [address]   Culture, blood (routine x 2)     Status: None   Collection Time: 02/08/23  4:39  PM   Specimen: BLOOD LEFT HAND  Result Value Ref Range Status   Specimen  Description BLOOD LEFT HAND  Final   Special Requests   Final    BOTTLES DRAWN AEROBIC AND ANAEROBIC Blood Culture results may not be optimal due to an inadequate volume of blood received in culture bottles   Culture   Final    NO GROWTH 5 DAYS Performed at [facility], [address], [place], [place] [address]    Report Status 02/13/2023 FINAL  Final  Culture, blood (routine x 2)     Status: None   Collection Time: 02/08/23  4:39 PM   Specimen: BLOOD LEFT FOREARM  Result Value Ref Range Status   Specimen Description BLOOD LEFT FOREARM  Final   Special Requests   Final    BOTTLES DRAWN AEROBIC AND ANAEROBIC Blood Culture results may not be optimal due to an inadequate volume of blood received in culture bottles   Culture   Final    NO GROWTH 5 DAYS Performed at [facility], [address], [place], [place] [address]    Report Status 02/13/2023 FINAL  Final  Respiratory (~20 pathogens) panel by PCR     Status: Abnormal   Collection Time: 02/08/23  4:39 PM   Specimen: Nasopharyngeal Swab; Respiratory  Result Value Ref Range Status   Adenovirus NOT DETECTED NOT DETECTED Final   Coronavirus 229E NOT DETECTED NOT DETECTED Final    Comment: (NOTE) The Coronavirus on the Respiratory Panel, DOES NOT test for the novel  Coronavirus (2019 nCoV)    Coronavirus HKU1 NOT DETECTED NOT DETECTED Final   Coronavirus NL63 NOT DETECTED NOT DETECTED Final   Coronavirus OC43 DETECTED (A) NOT DETECTED Final   Metapneumovirus NOT DETECTED NOT DETECTED Final   Rhinovirus / Enterovirus NOT DETECTED NOT DETECTED Final   Influenza A NOT DETECTED NOT DETECTED Final   Influenza B NOT DETECTED NOT DETECTED Final   Parainfluenza Virus 1 NOT DETECTED NOT DETECTED Final   Parainfluenza Virus 2 NOT DETECTED NOT DETECTED Final   Parainfluenza Virus 3 NOT DETECTED NOT DETECTED Final   Parainfluenza Virus 4 NOT DETECTED NOT DETECTED Final   Respiratory Syncytial Virus NOT DETECTED NOT DETECTED  Final   Bordetella pertussis NOT DETECTED NOT DETECTED Final   Bordetella Parapertussis NOT DETECTED NOT DETECTED Final   Chlamydophila pneumoniae NOT DETECTED NOT DETECTED Final   Mycoplasma pneumoniae NOT DETECTED NOT DETECTED Final    Comment: Performed at [facility], [address], [place], [place] [address]    Radiology Studies: No results found.  Scheduled Meds:  apixaban   5 mg Oral BID   feeding supplement  237 mL Oral BID BM   furosemide   80 mg Intravenous BID   guaiFENesin   600 mg Oral BID   midodrine   15 mg Oral TID WC   pantoprazole   40 mg Oral BID   sodium chloride  flush  10-40 mL Intracatheter Q12H   sodium chloride  flush  3 mL Intravenous Q12H   thiamine   100 mg Oral Daily   Continuous Infusions:  amiodarone  30 mg/hr (02/14/23 1538)   milrinone  0.25 mcg/kg/min (02/14/23 1538)    LOS: 5 days   Alejandro Marker, DO [facility] Available via Epic secure chat 7am-7pm After these hours, please refer to coverage provider listed on [email or web address] 02/14/2023, 4:08 PM

## 2023-02-14 NOTE — Progress Notes (Signed)
 Progress Note  Patient Name: Justin Fleming Date of Encounter: 02/14/2023  Primary Cardiologist: Jayson Sierras, MD  Interval Summary   Interval chart reviewed.  Net urine output of approximately 1400 cc last 24 hours.  Follow-up co-ox 81.  Creatinine down to 1.3.  Leg swelling continues to improve although not resolved.  No shortness of breath at rest.  Vital Signs    Vitals:   02/13/23 2352 02/14/23 0546 02/14/23 0801 02/14/23 1309  BP: 92/63 102/66 103/76 100/69  Pulse: 92 93 93 99  Resp: 20 18 18 18   Temp: 98.6 F (37 C) 98.7 F (37.1 C) 98.8 F (37.1 C) 98.7 F (37.1 C)  TempSrc: Oral Oral Oral Oral  SpO2: 96% 96% 98% 95%  Weight:  86.6 kg    Height:        Intake/Output Summary (Last 24 hours) at 02/14/2023 1312 Last data filed at 02/14/2023 0850 Gross per 24 hour  Intake 1406.89 ml  Output 1550 ml  Net -143.11 ml   Filed Weights   02/12/23 0542 02/13/23 0340 02/14/23 0546  Weight: 89.2 kg 87 kg 86.6 kg    Physical Exam   GEN: No acute distress.   Neck: No JVD. Cardiac: Indistinct PMI, RRR without gallop.  Respiratory: Nonlabored. Clear to auscultation bilaterally. GI: Soft, nontender, bowel sounds present. MS: Improving lower leg edema. Neuro:  Nonfocal. Psych: Alert and oriented x 3. Normal affect.  ECG/Telemetry    Telemetry reviewed showing sinus rhythm with occasional PACs.  Labs    Chemistry Recent Labs  Lab 02/12/23 0930 02/13/23 0537 02/14/23 0622  NA 136 137 135  K 3.8 3.2* 3.8  CL 95* 94* 91*  CO2 28 32 33*  GLUCOSE 115* 120* 112*  BUN 24* 23 21  CREATININE 1.46* 1.41* 1.30*  CALCIUM 8.5* 8.3* 8.2*  PROT 6.3* 5.9* 5.8*  ALBUMIN  2.5* 2.2* 2.2*  AST 25 21 21   ALT 11 12 12   ALKPHOS 72 58 57  BILITOT 1.7* 1.3* 1.3*  GFRNONAA 53* 55* >60  ANIONGAP 13 11 11     Hematology Recent Labs  Lab 02/12/23 0931 02/13/23 0537 02/14/23 0622  WBC 7.0 7.7 8.6  RBC 2.94* 2.62* 2.72*  HGB 8.9* 8.1* 8.3*  HCT 27.9* 24.8* 25.5*  MCV  94.9 94.7 93.8  MCH 30.3 30.9 30.5  MCHC 31.9 32.7 32.5  RDW 18.7* 18.4* 18.5*  PLT 190 218 257   Cardiac Enzymes Recent Labs  Lab 02/08/23 1639 02/08/23 1844 02/08/23 2304 02/09/23 0222  TROPONINIHS 49* 53* 68* 67*   Lipid Panel     Component Value Date/Time   CHOL 147 12/30/2022 0425   TRIG 59 12/30/2022 0425   HDL 66 12/30/2022 0425   CHOLHDL 2.2 12/30/2022 0425   VLDL 12 12/30/2022 0425   LDLCALC 69 12/30/2022 0425    Cardiac Studies   Echocardiogram 02/09/2023:  1. Left ventricular ejection fraction, by estimation, is 30 to 35%. The  left ventricle has moderately decreased function. The left ventricle  demonstrates global hypokinesis. Left ventricular diastolic function could  not be evaluated.   2. Right ventricular systolic function is moderately reduced. The right  ventricular size is moderately enlarged. Tricuspid regurgitation signal is  inadequate for assessing PA pressure.   3. Left atrial size was moderately dilated.   4. Right atrial size was moderately dilated.   5. The mitral valve is normal in structure. Mild to moderate mitral valve  regurgitation. No evidence of mitral stenosis.   6. The  aortic valve is tricuspid. Aortic valve regurgitation is not  visualized. No aortic stenosis is present.   TEE 02/11/2023:  1. Left ventricular ejection fraction, by estimation, is 30 to 35%. The  left ventricle has moderately decreased function.   2. No left atrial/left atrial appendage thrombus was detected.   3. Mild mitral valve regurgitation.   4. Tricuspid valve regurgitation is mild to moderate.   5. Aortic valve regurgitation is trivial.   6. There is a moderately sized patent foramen ovale with predominantly  right to left shunting across the atrial septum.   Assessment & Plan   1.  Persistent atrial fibrillation with RVR in the setting of alcohol abuse history and cardiomyopathy.  CHA2DS2-VASc score is 3.  Status post TEE guided cardioversion and remains  on Eliquis  as well as amiodarone .  Maintaining sinus rhythm at this time.  2.  HFrEF, LVEF 30 to 35%, most likely nonischemic in etiology, perhaps tachycardia induced or secondary to alcohol abuse.  Complicated by low output.  Tolerating milrinone  with Co-ox up to 81, good urine output, and improving renal function. Also on Lasix  80 mg IV twice daily with potassium supplement and midodrine  to support blood pressure.  GDMT has otherwise been limited so far.  3.  CKD stage IIIa with AKI.  Creatinine improved to 1.3 with GFR greater than 60.  4.  URI with coronavirus (non-COVID).  5.  Chronic anemia, multifactorial, does have prior history of GI bleed but not actively.  Hemoglobin stable at 8.3.  Continue Eliquis , Lasix , midodrine , potassium supplement, IV amiodarone , and milrinone .  He continues to diurese with improving renal function, creatinine down to 1.3.  Follow-up BMET in a.m.  Once fluid status optimized can likely wean off milrinone  and transition to divided dose Lasix  and oral amiodarone .  For questions or updates, please contact [facility] Please consult [email or web address] for contact info under   Signed, Jayson Sierras, MD  02/14/2023, 1:12 PM

## 2023-02-14 NOTE — TOC Progression Note (Signed)
 Transition of Care [facility]) - Progression Note    Patient Details  Name: Justin Fleming MRN: [id number] Date of Birth: 11/29/1960  [facility] CM/SW Contact  Olam FORBES Ally, LCSW Phone Number: 02/14/2023, 11:13 AM  Clinical Narrative:     CSW attempted to provide pt with bed offers via phone. Per MD, pt should be medically ready either Tuesday or Wednesday.  TOC team will continue to assist with discharge planning needs.    Expected Discharge Plan:  (skilled vrs.home agreeable to fax out for snf) Barriers to Discharge: Continued Medical Work up  Expected Discharge Plan and Services In-house Referral: Clinical Social Work     Living arrangements for the past 2 months: Single Family Home                                       Social Determinants of Health (SDOH) Interventions SDOH Screenings   Food Insecurity: No Food Insecurity (02/08/2023)  Housing: Low Risk  (02/08/2023)  Transportation Needs: No Transportation Needs (02/08/2023)  Utilities: Not At Risk (02/08/2023)  Alcohol Screen: Low Risk  (02/02/2018)  Depression (PHQ2-9): Low Risk  (02/02/2018)  Social Connections: Socially Isolated (02/08/2023)  Tobacco Use: Medium Risk (02/08/2023)    Readmission Risk Interventions    01/11/2023   12:44 PM  Readmission Risk Prevention Plan  Transportation Screening Complete  PCP or Specialist Appt within 5-7 Days Not Complete  Home Care Screening Complete  Medication Review (RN CM) Complete

## 2023-02-14 NOTE — Plan of Care (Signed)
  Problem: Education: Goal: Knowledge of General Education information will improve Description: Including pain rating scale, medication(s)/side effects and non-pharmacologic comfort measures Outcome: Progressing   Problem: Health Behavior/Discharge Planning: Goal: Ability to manage health-related needs will improve Outcome: Progressing   Problem: Clinical Measurements: Goal: Ability to maintain clinical measurements within normal limits will improve Outcome: Progressing Goal: Will remain free from infection Outcome: Progressing Goal: Diagnostic test results will improve Outcome: Progressing Goal: Respiratory complications will improve Outcome: Progressing Goal: Cardiovascular complication will be avoided Outcome: Progressing   Problem: Activity: Goal: Risk for activity intolerance will decrease Outcome: Progressing   Problem: Nutrition: Goal: Adequate nutrition will be maintained Outcome: Progressing   Problem: Coping: Goal: Level of anxiety will decrease Outcome: Progressing   Problem: Elimination: Goal: Will not experience complications related to bowel motility Outcome: Progressing Goal: Will not experience complications related to urinary retention Outcome: Progressing   Problem: Pain Managment: Goal: General experience of comfort will improve and/or be controlled Outcome: Progressing   Problem: Safety: Goal: Ability to remain free from injury will improve Outcome: Progressing   Problem: Skin Integrity: Goal: Risk for impaired skin integrity will decrease Outcome: Progressing   Problem: Education: Goal: Ability to demonstrate management of disease process will improve Outcome: Progressing Goal: Ability to verbalize understanding of medication therapies will improve Outcome: Progressing Goal: Individualized Educational Video(s) Outcome: Progressing   Problem: Activity: Goal: Capacity to carry out activities will improve Outcome: Progressing    Problem: Cardiac: Goal: Ability to achieve and maintain adequate cardiopulmonary perfusion will improve Outcome: Progressing   Problem: Education: Goal: Knowledge of disease or condition will improve Outcome: Progressing Goal: Understanding of medication regimen will improve Outcome: Progressing Goal: Individualized Educational Video(s) Outcome: Progressing   Problem: Activity: Goal: Ability to tolerate increased activity will improve Outcome: Progressing   Problem: Cardiac: Goal: Ability to achieve and maintain adequate cardiopulmonary perfusion will improve Outcome: Progressing   Problem: Health Behavior/Discharge Planning: Goal: Ability to safely manage health-related needs after discharge will improve Outcome: Progressing

## 2023-02-15 ENCOUNTER — Inpatient Hospital Stay (HOSPITAL_COMMUNITY): Payer: Medicare Other

## 2023-02-15 DIAGNOSIS — K700 Alcoholic fatty liver: Secondary | ICD-10-CM | POA: Diagnosis not present

## 2023-02-15 DIAGNOSIS — F1010 Alcohol abuse, uncomplicated: Secondary | ICD-10-CM | POA: Diagnosis not present

## 2023-02-15 DIAGNOSIS — R7989 Other specified abnormal findings of blood chemistry: Secondary | ICD-10-CM | POA: Diagnosis not present

## 2023-02-15 DIAGNOSIS — I4891 Unspecified atrial fibrillation: Secondary | ICD-10-CM | POA: Diagnosis not present

## 2023-02-15 LAB — COMPREHENSIVE METABOLIC PANEL
ALT: 11 U/L (ref 0–44)
AST: 21 U/L (ref 15–41)
Albumin: 2.2 g/dL — ABNORMAL LOW (ref 3.5–5.0)
Alkaline Phosphatase: 54 U/L (ref 38–126)
Anion gap: 16 — ABNORMAL HIGH (ref 5–15)
BUN: 19 mg/dL (ref 8–23)
CO2: 34 mmol/L — ABNORMAL HIGH (ref 22–32)
Calcium: 8.7 mg/dL — ABNORMAL LOW (ref 8.9–10.3)
Chloride: 83 mmol/L — ABNORMAL LOW (ref 98–111)
Creatinine, Ser: 1.61 mg/dL — ABNORMAL HIGH (ref 0.61–1.24)
GFR, Estimated: 47 mL/min — ABNORMAL LOW (ref >60)
Glucose, Bld: 157 mg/dL — ABNORMAL HIGH (ref 70–99)
Potassium: 4 mmol/L (ref 3.5–5.1)
Sodium: 133 mmol/L — ABNORMAL LOW (ref 135–145)
Total Bilirubin: 1.3 mg/dL — ABNORMAL HIGH (ref 0.0–1.2)
Total Protein: 5.8 g/dL — ABNORMAL LOW (ref 6.5–8.1)

## 2023-02-15 LAB — CBC WITH DIFFERENTIAL/PLATELET
Abs Immature Granulocytes: 0.06 10*3/uL (ref 0.00–0.07)
Basophils Absolute: 0.1 10*3/uL (ref 0.0–0.1)
Basophils Relative: 1 %
Eosinophils Absolute: 0.2 10*3/uL (ref 0.0–0.5)
Eosinophils Relative: 2 %
HCT: 25.7 % — ABNORMAL LOW (ref 39.0–52.0)
Hemoglobin: 8.3 g/dL — ABNORMAL LOW (ref 13.0–17.0)
Immature Granulocytes: 1 %
Lymphocytes Relative: 10 %
Lymphs Abs: 1 10*3/uL (ref 0.7–4.0)
MCH: 30.2 pg (ref 26.0–34.0)
MCHC: 32.3 g/dL (ref 30.0–36.0)
MCV: 93.5 fL (ref 80.0–100.0)
Monocytes Absolute: 1.4 10*3/uL — ABNORMAL HIGH (ref 0.1–1.0)
Monocytes Relative: 14 %
Neutro Abs: 7.6 10*3/uL (ref 1.7–7.7)
Neutrophils Relative %: 72 %
Platelets: 321 10*3/uL (ref 150–400)
RBC: 2.75 MIL/uL — ABNORMAL LOW (ref 4.22–5.81)
RDW: 18.6 % — ABNORMAL HIGH (ref 11.5–15.5)
WBC: 10.3 10*3/uL (ref 4.0–10.5)
nRBC: 0 % (ref 0.0–0.2)

## 2023-02-15 LAB — BRAIN NATRIURETIC PEPTIDE: B Natriuretic Peptide: 964.6 pg/mL — ABNORMAL HIGH (ref 0.0–100.0)

## 2023-02-15 LAB — COOXEMETRY PANEL
Carboxyhemoglobin: 2.4 % — ABNORMAL HIGH (ref 0.5–1.5)
Methemoglobin: <0.7 % (ref 0.0–1.5)
O2 Saturation: 63.9 %
Total hemoglobin: 8.6 g/dL — ABNORMAL LOW (ref 12.0–16.0)

## 2023-02-15 LAB — PHOSPHORUS: Phosphorus: 3.2 mg/dL (ref 2.5–4.6)

## 2023-02-15 LAB — MAGNESIUM: Magnesium: 1.7 mg/dL (ref 1.7–2.4)

## 2023-02-15 MED ORDER — IPRATROPIUM BROMIDE 0.02 % IN SOLN: 0.5000 mg | Freq: Four times a day (QID) | RESPIRATORY_TRACT | Status: DC | PRN

## 2023-02-15 MED ORDER — LEVALBUTEROL HCL 0.63 MG/3ML IN NEBU
0.6300 mg | INHALATION_SOLUTION | Freq: Four times a day (QID) | RESPIRATORY_TRACT | Status: DC
  Administered 2023-02-15 – 2023-02-16 (×3): 0.63 mg via RESPIRATORY_TRACT
  Filled 2023-02-15 (×3): qty 3

## 2023-02-15 MED ORDER — SODIUM CHLORIDE 0.9 % IV SOLN
1.0000 g | INTRAVENOUS | Status: DC
  Administered 2023-02-15 – 2023-02-19 (×5): 1 g via INTRAVENOUS
  Filled 2023-02-15 (×5): qty 10

## 2023-02-15 MED ORDER — MAGNESIUM SULFATE 4 GM/100ML IV SOLN
4.0000 g | Freq: Once | INTRAVENOUS | Status: AC
  Administered 2023-02-15: 4 g via INTRAVENOUS
  Filled 2023-02-15: qty 100

## 2023-02-15 MED ORDER — GUAIFENESIN ER 600 MG PO TB12
1200.0000 mg | ORAL_TABLET | Freq: Two times a day (BID) | ORAL | Status: DC
  Administered 2023-02-15 – 2023-02-22 (×14): 1200 mg via ORAL
  Filled 2023-02-15 (×14): qty 2

## 2023-02-15 MED ORDER — SODIUM CHLORIDE 0.9 % IV SOLN
500.0000 mg | INTRAVENOUS | Status: DC
  Administered 2023-02-15: 500 mg via INTRAVENOUS
  Filled 2023-02-15 (×3): qty 5

## 2023-02-15 NOTE — Progress Notes (Signed)
 Occupational Therapy Treatment Patient Details Name: Justin Fleming MRN: [id number] DOB: [birth year] Today's Date: 02/15/2023   History of present illness 63 y.o. male presented 02/08/23 with tachycardia, febrile, upper respiratory symptoms. +COVID  Afib with RVR   PMH significant of a-fib, CHF, gout, GERD, alcohol abuse.   OT comments  Patient received in supine and agreeable to OT session. Patient demonstrating good gains with bed mobility, transfers and grooming but did require increased time to follow directions. Patient will benefit from continued inpatient follow up therapy, <3 hours/day. Acute OT to continue to follow to address established goals to facilitate DC to next venue of care.       If plan is discharge home, recommend the following:  Assistance with cooking/housework;A lot of help with walking and/or transfers;A lot of help with bathing/dressing/bathroom;Assist for transportation;Help with stairs or ramp for entrance   Equipment Recommendations  Tub/shower bench    Recommendations for Other Services      Precautions / Restrictions Precautions Precautions: Fall Precaution Comments: watch HR and SpO2 Restrictions Weight Bearing Restrictions Per Provider Order: No       Mobility Bed Mobility Overal bed mobility: Needs Assistance Bed Mobility: Supine to Sit     Supine to sit: Mod assist, HOB elevated, Used rails     General bed mobility comments: assistance with BLEs to get off EOB and CGA with trunk    Transfers Overall transfer level: Needs assistance Equipment used: Rolling walker (2 wheels) Transfers: Sit to/from Stand, Bed to chair/wheelchair/BSC Sit to Stand: Max assist Stand pivot transfers: Max assist         General transfer comment: patient required max assist to power up and to pivot to recliner with use of RW. Patient unable to step towards recliner     Balance Overall balance assessment: Needs assistance Sitting-balance support:  Bilateral upper extremity supported, Feet supported Sitting balance-Leahy Scale: Fair Sitting balance - Comments: supervision seated on EOB   Standing balance support: Reliant on assistive device for balance, Bilateral upper extremity supported, During functional activity Standing balance-Leahy Scale: Poor Standing balance comment: reliant on RW for support when standing                           ADL either performed or assessed with clinical judgement   ADL Overall ADL's : Needs assistance/impaired     Grooming: Wash/dry hands;Wash/dry face;Oral care;Supervision/safety;Sitting Grooming Details (indicate cue type and reason): cues to initiate             Lower Body Dressing: Total assistance;Bed level Lower Body Dressing Details (indicate cue type and reason): to donn socks Toilet Transfer: Moderate assistance;Rolling walker (2 wheels) Toilet Transfer Details (indicate cue type and reason): simulated to recliner                Extremity/Trunk Assessment Upper Extremity Assessment Upper Extremity Assessment: Overall WFL for tasks assessed            Vision       Perception     Praxis      Cognition Arousal: Alert Behavior During Therapy: WFL for tasks assessed/performed Overall Cognitive Status: Within Functional Limits for tasks assessed                           Safety/Judgement: Decreased awareness of deficits, Decreased awareness of safety     General Comments: slow processing and increased time to follow commands  Exercises      Shoulder Instructions       General Comments SPO2 93% on 2 liters, HR 102, BP 115/78 seated in recliner    Pertinent Vitals/ Pain       Pain Assessment Pain Assessment: Faces Faces Pain Scale: Hurts a little bit Pain Location: BLEs Pain Descriptors / Indicators: Sore, Grimacing Pain Intervention(s): Monitored during session, Repositioned  Home Living                                           Prior Functioning/Environment              Frequency  Min 1X/week        Progress Toward Goals  OT Goals(current goals can now be found in the care plan section)  Progress towards OT goals: Progressing toward goals  Acute Rehab OT Goals OT Goal Formulation: With patient Time For Goal Achievement: 02/24/23 Potential to Achieve Goals: Good ADL Goals Pt Will Perform Grooming: with supervision;standing Pt Will Perform Lower Body Bathing: with supervision;sit to/from stand Pt Will Perform Lower Body Dressing: with supervision;sit to/from stand Pt Will Transfer to Toilet: with supervision;ambulating Pt Will Perform Toileting - Clothing Manipulation and hygiene: with supervision;sit to/from stand;sitting/lateral leans Additional ADL Goal #1: Pt will employ energy conservation strategies in ADLs and mobility.  Plan      Co-evaluation                 AM-PAC OT "6 Clicks" Daily Activity     Outcome Measure   Help from another person eating meals?: None Help from another person taking care of personal grooming?: A Little Help from another person toileting, which includes using toliet, bedpan, or urinal?: A Lot Help from another person bathing (including washing, rinsing, drying)?: A Lot Help from another person to put on and taking off regular upper body clothing?: A Little Help from another person to put on and taking off regular lower body clothing?: Total 6 Click Score: 15    End of Session Equipment Utilized During Treatment: Gait belt;Rolling walker (2 wheels);Oxygen (2 liters)  OT Visit Diagnosis: Pain;Muscle weakness (generalized) (M62.81) Pain - Right/Left: Right (and left) Pain - part of body: Leg   Activity Tolerance Patient tolerated treatment well   Patient Left in chair;with call bell/phone within reach;with chair alarm set;with nursing/sitter in room   Nurse Communication Mobility status;Need for lift equipment         Time: 4782-9562 OT Time Calculation (min): 31 min  Charges: OT General Charges $OT Visit: 1 Visit OT Treatments $Self Care/Home Management : 8-22 mins $Therapeutic Activity: 8-22 mins  Anitra Barn, OTA Acute Rehabilitation Services  Office [phone]   Jovita Nipper 02/15/2023, 1:18 PM

## 2023-02-15 NOTE — Progress Notes (Signed)
 PROGRESS NOTE    Justin Fleming  ZOX:[id number] DOB: March 11, 1960 DOA: 02/08/2023 PCP: Jenelle Mis, FNP   Brief Narrative:  The patient is a 63 year old AAM with a past medical history significant for but not limited to atrial fibrillation, CHF, gout, GERD, history of alcohol abuse and other comorbidities who continues to drink significant alcohol.  Is admitted with A-fib with RVR tachypnea and worsening edema and echocardiogram revealed left ventricular ejection fraction of 30 to 35%, global hypokinesis of left ventricle and reduced right ventricular function with moderate enlargement.  He also was noted to have worsening renal function.  Currently is getting diuresis and cardiology's been consulted and he is on amiodarone  drip for his uncontrolled heart rates with A-fib with RVR.  Respiratory virus panel was positive for coronavirus OC 43.  He is being diuresed and underwent TEE/DCCV today and has converted to NSR.   He was currently getting diuresis per Cardiology and had PICC line placed. Co-Ox came back at 31 and the medical Cardiolog team discussed with advanced heart failure team who recommended starting milrinone  and getting CVP's.  Now he is getting diuresis IV Lasix  80 mg twice daily and is on the low normal drip.  Because he is on neurologic his p.o. amiodarone  was changed back to an amiodarone  drip.  Cardiology recommends continuing diuresis and following up electrolytes and once his fluid status is optimized they are recommending that he can likely wean off of milrinone  and transition to divided doses of Lasix  and oral Amiodarone .  Assessment and Plan:  Atrial Fibrillation with RVR [facility]) s/p TEE/DCCV and conversion to NSR -Cardiology input is appreciated  -Patient was on amiodarone  drip and transition to oral amiodarone  as below however it has been changed patient back to amiodarone  drip given that he is being started on milrinone  -Cardiology team plans to add beta-blockers and he  is now on metoprolol  tartrate 25 mg p.o. twice daily and cardiology recommends transitioning to Toprol -XL prior to discharge given his reduced EF -Continue anticoagulation with Apixaban  for now. -TSH was 2.743 -Echocardiogram reveals new cardiomyopathy, probably tachycardia induced. -Cardiology took patient for TEE/DCCV 02/11/23 and it was done; Findings on TEE showed "Moderate reduced LV function, Mild MR,+PFO,Negative study for LAA thrombus"and he is now in Sinus Rhythm -He was on a p.o. Taper of Amiodarone  200 mg twice daily for next week and then he will go to 200 mg daily after however he was changed back to IV amiodarone  given that he is being switched to IV Amiodarone  given that the milrinone  drip is proarrhythmic.  Per cardiology recommending switching back to oral once he is off of Milrinone    Acute on Chronic Systolic CHF/Cardiomyopathy: -Cardiology team is directing care given that there is concern for low output heart failure.. -He is status post TEE/DCCV -Continues to be markedly volume overloaded and BNP was elevated 570 on admission and is now 964.6 -Echocardiogram done and showed an LVEF of 30 to 35% with global hypokinesis, moderately reduced right ventricular function, moderate biatrial enlargement and mild to moderate MR; this EF is down from 50 to 55% back in December Intake/Output Summary (Last 24 hours) at 02/15/2023 1834 Last data filed at 02/15/2023 1712 Gross per 24 hour  Intake 1031.6 ml  Output 4100 ml  Net -3068.4 ml   -He continues to be volume overloaded so cardiology increased his Lasix  dose to 80 mg IV  and placed a PICC line for Co-ox; Given his Low Co-ox (54.5) he was started on a  Milrinone  gtt; Now Co-Ox is 63.9 today -Continue further care per Cardiology and were are recommending continuing the IV Lasix  80 mg twice daily with potassium supplementation and Midodrine  to support blood pressures and feel that his GDMT is limited otherwise.; Today his Diuresis was  dropped to just IV 80 mg Daily -Cardiology recommends attempting to wean oxygen and stopping milrinone  in next 24 to 48 hours and continue daily Co-Ox while PICC is in place and cardiology is proposing doing a right heart cath to assess his volume status and left heart cath to have a proper ischemic workup tomorrow renal function stable and he is now n.p.o. at midnight  Alcohol Abuse, continuous: -Counseled to quit alcohol. -CIWA Protocol was initiated and appears to be out of withdrawals -Low-dose to try Librium  25 Mg p.o. every 6 hourly x 4 doses and reviewed.   -Patient had remained significantly tachycardic, despite amiodarone  drip but HR's are improving and he is now in NSR and not Tachycardic at all   Chronic Normocytic Anemia, stable  -Likely Multifactorial. Hgb/Hct Trend: Recent Labs  Lab 02/09/23 0222 02/10/23 0455 02/11/23 0439 02/12/23 0931 02/13/23 0537 02/14/23 0622 02/15/23 0409  HGB 10.3* 9.3* 9.1* 8.9* 8.1* 8.3* 8.3*  HCT 32.8* 28.7* 28.4* 27.9* 24.8* 25.5* 25.7*  MCV 98.8 95.7 95.3 94.9 94.7 93.8 93.5  -Obtained Anemia Panel with iron level of 18, UIBC 219, TIBC of 237, saturation ratios of 8%, ferritin low 3-5, folate level 32.6 and a vitamin B12 432 -Transfuse for Hg <7 ,rapidly dropping or  if symptomatic -Continue to monitor for signs and symptoms bleeding; no overt bleeding noted   Hypokalemia -Patient's K+ Level Trend: Recent Labs  Lab 02/09/23 0222 02/10/23 0455 02/11/23 0439 02/12/23 0930 02/13/23 0537 02/14/23 0622 02/15/23 0409  K 4.3 3.4* 3.8 3.8 3.2* 3.8 4.0  -Replete with po Kcl 40 mEQ x1 -Continue to Monitor and Replete as Necessary -Repeat CMP in the AM   Hypomagnesemia -Patient's Mag Level Trend: Recent Labs  Lab 02/09/23 0222 02/10/23 0455 02/11/23 0439 02/12/23 0931 02/13/23 0537 02/14/23 0622 02/15/23 0409  MG 1.5* 1.2* 1.9 2.0 1.6* 1.7 1.7  -Replete with IV Mag Sulfate 2 grams again -Continue to Monitor and Replete as  Necessary -Repeat Mag in the AM   AKI vs CKD stage IIIa, worsening  -BUN/Cr Trend: Recent Labs  Lab 02/09/23 0222 02/10/23 0455 02/11/23 0439 02/12/23 0930 02/13/23 0537 02/14/23 0622 02/15/23 0409  BUN 14 16 19  24* 23 21 19   CREATININE 1.36* 1.40* 1.47* 1.46* 1.41* 1.30* 1.61*  -Avoid Nephrotoxic Medications, Contrast Dyes, Hypotension and Dehydration to Ensure Adequate Renal Perfusion and will need to Renally Adjust Meds -Continue to Monitor and Trend Renal Function carefully and repeat CMP in the AM   Hyponatremia, fluctuating  -Mild and likely in the setting of volume overload. Na+ Trend: Recent Labs  Lab 02/09/23 0222 02/10/23 0455 02/11/23 0439 02/12/23 0930 02/13/23 0537 02/14/23 0622 02/15/23 0409  NA 139 134* 135 136 137 135 133*  -Continue monitor trend and repeat CMP in a.m.   Elevated Troponin -In the setting of tachycardia -Troponin went from 68 -> 67 -Continue to monitor  -Cardiology is following   Alcohol Induced Fatty Liver -Refer to GI team on discharge.   URI (Upper Respiratory Infection) with Coronavirus (Not COVID) -Respiratory panel significant for Coronavirus OC43.   -Blood cultures pending -Procalcitonin is 0.35.  -Patient is still intermittently been febrile and had SIRS criteria; spiked a temperature of 101.5 the night before last  and now T Max was 101.7  -Fortunately he has no leukocytosis but WBC is trending upward slowly -Will add IV Ceftriaxone  and Azithromycin  to cover superimposed Bacterial PNA -CXR done today showed "Support apparatus, as above.The appearance of the chest is concerning for multilobar bilateral  pneumonia, most confluent in the left mid to lower lung. Small bilateral pleural effusions. Mild cardiomegaly." -Increase Guaifenesin  to 1200 mg po BID, add Xopenex  q6h, and Atrovent  q6hprn -Continue with supportive care and repeat CXR in the AM  Hyperbilirubinemia, stable  -Bilirubin Trend: Recent Labs  Lab 02/09/23 0222  02/10/23 0500 02/11/23 0439 02/12/23 0930 02/13/23 0537 02/14/23 0622 02/15/23 0409  BILITOT 1.8* 1.3* 1.1 1.7* 1.3* 1.3* 1.3*  -Continue to Monitor and Trend and Repeat CMP in the AM  Tobacco Abuse -Smoking Cessation counseling given -Discontinue Nicotine  21 mg TD Patch now as patient requesting no longer wanting it  Hypoalbuminemia -Patient's Albumin  Trend: Recent Labs  Lab 02/10/23 0455 02/10/23 0500 02/11/23 0439 02/12/23 0930 02/13/23 0537 02/14/23 0622 02/15/23 0409  ALBUMIN  2.6* 2.6* 2.5* 2.5* 2.2* 2.2* 2.2*  -Continue to Monitor and Trend and repeat CMP in the AM  Overweight -Complicates overall prognosis and care -Estimated body mass index is 26.32 kg/m as calculated from the following:   Height as of this encounter: 5\' 10"  (1.778 m).   Weight as of this encounter: 83.2 kg.  -Weight Loss and Dietary Counseling given   DVT prophylaxis:  apixaban  (ELIQUIS ) tablet 5 mg    Code Status: Full Code Family Communication: No family present at bedside  Disposition Plan:  Level of care: Telemetry Cardiac Status is: Inpatient Remains inpatient appropriate because: Needs further clinical diuresis and clearance by the specialists as he is selected a bed for skilled rehab but needs insurance authorization   Consultants:  Cardiology  Procedures:  ECHOCARDIOGRAM IMPRESSIONS     1. Left ventricular ejection fraction, by estimation, is 30 to 35%. The  left ventricle has moderately decreased function. The left ventricle  demonstrates global hypokinesis. Left ventricular diastolic function could  not be evaluated.   2. Right ventricular systolic function is moderately reduced. The right  ventricular size is moderately enlarged. Tricuspid regurgitation signal is  inadequate for assessing PA pressure.   3. Left atrial size was moderately dilated.   4. Right atrial size was moderately dilated.   5. The mitral valve is normal in structure. Mild to moderate mitral valve   regurgitation. No evidence of mitral stenosis.   6. The aortic valve is tricuspid. Aortic valve regurgitation is not  visualized. No aortic stenosis is present.   Comparison(s): Prior images reviewed side by side. The left ventricular  function is worsened.   FINDINGS   Left Ventricle: Mobile echodensity at the apex appears to be a false  tendon. Left ventricular ejection fraction, by estimation, is 30 to 35%.  The left ventricle has moderately decreased function. The left ventricle  demonstrates global hypokinesis.  Definity  contrast agent was given IV to delineate the left ventricular  endocardial borders. The left ventricular internal cavity size was normal  in size. There is borderline concentric left ventricular hypertrophy. Left  ventricular diastolic function  could not be evaluated due to atrial fibrillation. Left ventricular  diastolic function could not be evaluated.   Right Ventricle: The right ventricular size is moderately enlarged. Right  vetricular wall thickness was not well visualized. Right ventricular  systolic function is moderately reduced. Tricuspid regurgitation signal is  inadequate for assessing PA  pressure.  Left Atrium: Left atrial size was moderately dilated.   Right Atrium: Right atrial size was moderately dilated.   Pericardium: There is no evidence of pericardial effusion.   Mitral Valve: The mitral valve is normal in structure. Mild to moderate  mitral valve regurgitation, with centrally-directed jet. No evidence of  mitral valve stenosis. MV peak gradient, 6.9 mmHg. The mean mitral valve  gradient is 3.7 mmHg.   Tricuspid Valve: The tricuspid valve is grossly normal. Tricuspid valve  regurgitation is not demonstrated.   Aortic Valve: The aortic valve is tricuspid. Aortic valve regurgitation is  not visualized. No aortic stenosis is present. Aortic valve mean gradient  measures 3.0 mmHg. Aortic valve peak gradient measures 4.6 mmHg. Aortic   valve area, by VTI measures 2.04  cm.   Pulmonic Valve: The pulmonic valve was not well visualized. Pulmonic valve  regurgitation is not visualized. No evidence of pulmonic stenosis.   Aorta: The aortic root is normal in size and structure.   IAS/Shunts: The interatrial septum was not well visualized.     LEFT VENTRICLE  PLAX 2D  LVIDd:         4.30 cm     Diastology  LVIDs:         3.60 cm     LV e' medial:    5.51 cm/s  LV PW:         1.10 cm     LV E/e' medial:  23.1  LV IVS:        1.20 cm     LV e' lateral:   9.29 cm/s  LVOT diam:     1.90 cm     LV E/e' lateral: 13.7  LV SV:         29  LV SV Index:   14  LVOT Area:     2.84 cm    LV Volumes (MOD)  LV vol d, MOD A2C: 98.5 ml  LV vol d, MOD A4C: 70.6 ml  LV vol s, MOD A2C: 62.5 ml  LV vol s, MOD A4C: 51.3 ml  LV SV MOD A2C:     36.0 ml  LV SV MOD A4C:     70.6 ml  LV SV MOD BP:      26.5 ml   RIGHT VENTRICLE            IVC  RV Basal diam:  4.50 cm    IVC diam: 2.30 cm  RV S prime:     8.37 cm/s   LEFT ATRIUM              Index        RIGHT ATRIUM           Index  LA diam:        4.20 cm  2.02 cm/m   RA Area:     22.20 cm  LA Vol (A2C):   117.0 ml 56.29 ml/m  RA Volume:   70.70 ml  34.02 ml/m  LA Vol (A4C):   55.2 ml  26.56 ml/m  LA Biplane Vol: 81.1 ml  39.02 ml/m   AORTIC VALVE  AV Area (Vmax):    1.89 cm  AV Area (Vmean):   1.80 cm  AV Area (VTI):     2.04 cm  AV Vmax:           107.43 cm/s  AV Vmean:          79.233 cm/s  AV VTI:  0.141 m  AV Peak Grad:      4.6 mmHg  AV Mean Grad:      3.0 mmHg  LVOT Vmax:         71.57 cm/s  LVOT Vmean:        50.200 cm/s  LVOT VTI:          0.102 m  LVOT/AV VTI ratio: 0.72    AORTA  Ao Root diam: 3.50 cm   MITRAL VALVE  MV Area (PHT): 5.32 cm     SHUNTS  MV Area VTI:   1.80 cm     Systemic VTI:  0.10 m  MV Peak grad:  6.9 mmHg     Systemic Diam: 1.90 cm  MV Mean grad:  3.7 mmHg  MV Vmax:       1.32 m/s  MV Vmean:      88.5 cm/s  MV  Decel Time: 143 msec  MR Peak grad: 81.5 mmHg  MR Vmax:      451.33 cm/s  MV E velocity: 127.33 cm/s    TEE/DCCV COMPLICATIONS:     There were no immediate complications.   FINDINGS:  Moderate reduced LV function Mild MR +PFO Negative study for LAA thrombus   Antimicrobials:  Anti-infectives (From admission, onward)    Start     Dose/Rate Route Frequency Ordered Stop   02/15/23 1915  cefTRIAXone  (ROCEPHIN ) 1 g in sodium chloride  0.9 % 100 mL IVPB        1 g 200 mL/hr over 30 Minutes Intravenous Every 24 hours 02/15/23 1828     02/15/23 1915  azithromycin  (ZITHROMAX ) 500 mg in sodium chloride  0.9 % 250 mL IVPB        500 mg 250 mL/hr over 60 Minutes Intravenous Every 24 hours 02/15/23 1828         Subjective: Seen and examined at bedside and he was doing okay and thinks his shortness of breath is doing okay however I saw him putting a whole packet of salt on his food.  He denies any nausea or vomiting.  No other concerns or complaints at this time but was febrile overnight again and felt like he was "sweating".  Objective: Vitals:   02/15/23 1143 02/15/23 1658 02/15/23 1700 02/15/23 1821  BP: 115/78 101/66    Pulse: (!) 101 100    Resp: 18 16    Temp: 99.5 F (37.5 C) 100.3 F (37.9 C) (!) 101.7 F (38.7 C) 100.2 F (37.9 C)  TempSrc: Oral Oral Oral Oral  SpO2: 95% 94%    Weight:      Height:        Intake/Output Summary (Last 24 hours) at 02/15/2023 1839 Last data filed at 02/15/2023 1712 Gross per 24 hour  Intake 1031.6 ml  Output 4100 ml  Net -3068.4 ml   Filed Weights   02/13/23 0340 02/14/23 0546 02/15/23 0325  Weight: 87 kg 86.6 kg 83.2 kg   Examination: Physical Exam:  Constitutional: Well-nourished, well-developed overweight chronically ill-appearing African-American male sitting in the chair at bedside Respiratory: Diminished to auscultation bilaterally with coarse breath sounds, no wheezing, rales, rhonchi or crackles. Normal respiratory effort  and patient is not tachypenic. No accessory muscle use.  Unlabored breathing but is wearing supplemental O2 via Schubert 1 Liter Cardiovascular: RRR, no murmurs / rubs / gallops. S1 and S2 auscultated.  1-2 plus lower extremity pitting edema Abdomen: Soft, non-tender, non-distended.  Bowel sounds positive.  GU: Deferred. Musculoskeletal: No clubbing / cyanosis  of digits/nails. No joint deformity upper and lower extremities.  Skin: No rashes, lesions, ulcers on a limited skin evaluation. No induration; Warm and dry.  Neurologic: CN 2-12 grossly intact with no focal deficits. Romberg sign and cerebellar reflexes not assessed.  Psychiatric: Normal judgment and insight. Appears calm and is awake and oriented  Data Reviewed: I have personally reviewed following labs and imaging studies  CBC: Recent Labs  Lab 02/11/23 0439 02/12/23 0931 02/13/23 0537 02/14/23 0622 02/15/23 0409  WBC 7.7 7.0 7.7 8.6 10.3  NEUTROABS 5.4 5.4 5.8 6.3 7.6  HGB 9.1* 8.9* 8.1* 8.3* 8.3*  HCT 28.4* 27.9* 24.8* 25.5* 25.7*  MCV 95.3 94.9 94.7 93.8 93.5  PLT 175 190 218 257 321   Basic Metabolic Panel: Recent Labs  Lab 02/11/23 0439 02/12/23 0930 02/12/23 0931 02/13/23 0537 02/14/23 0622 02/15/23 0409  NA 135 136  --  137 135 133*  K 3.8 3.8  --  3.2* 3.8 4.0  CL 95* 95*  --  94* 91* 83*  CO2 30 28  --  32 33* 34*  GLUCOSE 105* 115*  --  120* 112* 157*  BUN 19 24*  --  23 21 19   CREATININE 1.47* 1.46*  --  1.41* 1.30* 1.61*  CALCIUM 8.0* 8.5*  --  8.3* 8.2* 8.7*  MG 1.9  --  2.0 1.6* 1.7 1.7  PHOS 3.7  --  3.3 3.3 3.0 3.2   GFR: Estimated Creatinine Clearance: 47.2 mL/min (A) (by C-G formula based on SCr of 1.61 mg/dL (H)). Liver Function Tests: Recent Labs  Lab 02/11/23 0439 02/12/23 0930 02/13/23 0537 02/14/23 0622 02/15/23 0409  AST 19 25 21 21 21   ALT 11 11 12 12 11   ALKPHOS 57 72 58 57 54  BILITOT 1.1 1.7* 1.3* 1.3* 1.3*  PROT 6.3* 6.3* 5.9* 5.8* 5.8*  ALBUMIN  2.5* 2.5* 2.2* 2.2* 2.2*    No results for input(s): "LIPASE", "AMYLASE" in the last 168 hours. Recent Labs  Lab 02/08/23 1921  AMMONIA 37*   Coagulation Profile: No results for input(s): "INR", "PROTIME" in the last 168 hours. Cardiac Enzymes: Recent Labs  Lab 02/09/23 0222  CKTOTAL 37*   BNP (last 3 results) No results for input(s): "PROBNP" in the last 8760 hours. HbA1C: No results for input(s): "HGBA1C" in the last 72 hours. CBG: No results for input(s): "GLUCAP" in the last 168 hours. Lipid Profile: No results for input(s): "CHOL", "HDL", "LDLCALC", "TRIG", "CHOLHDL", "LDLDIRECT" in the last 72 hours. Thyroid Function Tests: No results for input(s): "TSH", "T4TOTAL", "FREET4", "T3FREE", "THYROIDAB" in the last 72 hours. Anemia Panel: No results for input(s): "VITAMINB12", "FOLATE", "FERRITIN", "TIBC", "IRON", "RETICCTPCT" in the last 72 hours. Sepsis Labs: Recent Labs  Lab 02/08/23 1855 02/09/23 0222 02/12/23 0931 02/12/23 1223  PROCALCITON  --  0.35  --   --   LATICACIDVEN 1.1  --  1.6 1.0   Recent Results (from the past 240 hours)  Resp panel by RT-PCR (RSV, Flu A&B, Covid) Anterior Nasal Swab     Status: None   Collection Time: 02/08/23  4:39 PM   Specimen: Anterior Nasal Swab  Result Value Ref Range Status   SARS Coronavirus 2 by RT PCR NEGATIVE NEGATIVE Final   Influenza A by PCR NEGATIVE NEGATIVE Final   Influenza B by PCR NEGATIVE NEGATIVE Final    Comment: (NOTE) The Xpert Xpress SARS-CoV-2/FLU/RSV plus assay is intended as an aid in the diagnosis of influenza from Nasopharyngeal swab specimens and should not  be used as a sole basis for treatment. Nasal washings and aspirates are unacceptable for Xpert Xpress SARS-CoV-2/FLU/RSV testing.  Fact Sheet for Patients: BloggerCourse.com  Fact Sheet for Healthcare Providers: SeriousBroker.it  This test is not yet approved or cleared by the [place]  FDA and has been  authorized for detection and/or diagnosis of SARS-CoV-2 by FDA under an Emergency Use Authorization (EUA). This EUA will remain in effect (meaning this test can be used) for the duration of the COVID-19 declaration under Section 564(b)(1) of the Act, 21 U.S.C. section 360bbb-3(b)(1), unless the authorization is terminated or revoked.     Resp Syncytial Virus by PCR NEGATIVE NEGATIVE Final    Comment: (NOTE) Fact Sheet for Patients: BloggerCourse.com  Fact Sheet for Healthcare Providers: SeriousBroker.it  This test is not yet approved or cleared by the [place]  FDA and has been authorized for detection and/or diagnosis of SARS-CoV-2 by FDA under an Emergency Use Authorization (EUA). This EUA will remain in effect (meaning this test can be used) for the duration of the COVID-19 declaration under Section 564(b)(1) of the Act, 21 U.S.C. section 360bbb-3(b)(1), unless the authorization is terminated or revoked.  Performed at [facility], [address] [place], [place] [address]   Culture, blood (routine x 2)     Status: None   Collection Time: 02/08/23  4:39 PM   Specimen: BLOOD LEFT HAND  Result Value Ref Range Status   Specimen Description BLOOD LEFT HAND  Final   Special Requests   Final    BOTTLES DRAWN AEROBIC AND ANAEROBIC Blood Culture results may not be optimal due to an inadequate volume of blood received in culture bottles   Culture   Final    NO GROWTH 5 DAYS Performed at [facility], [address] [place], [place] [address]    Report Status 02/13/2023 FINAL  Final  Culture, blood (routine x 2)     Status: None   Collection Time: 02/08/23  4:39 PM   Specimen: BLOOD LEFT FOREARM  Result Value Ref Range Status   Specimen Description BLOOD LEFT FOREARM  Final   Special Requests   Final    BOTTLES DRAWN AEROBIC AND ANAEROBIC Blood Culture results may not be optimal due to an inadequate volume of  blood received in culture bottles   Culture   Final    NO GROWTH 5 DAYS Performed at [facility], [address] [place], [place] [address]    Report Status 02/13/2023 FINAL  Final  Respiratory (~20 pathogens) panel by PCR     Status: Abnormal   Collection Time: 02/08/23  4:39 PM   Specimen: Nasopharyngeal Swab; Respiratory  Result Value Ref Range Status   Adenovirus NOT DETECTED NOT DETECTED Final   Coronavirus 229E NOT DETECTED NOT DETECTED Final    Comment: (NOTE) The Coronavirus on the Respiratory Panel, DOES NOT test for the novel  Coronavirus (2019 nCoV)    Coronavirus HKU1 NOT DETECTED NOT DETECTED Final   Coronavirus NL63 NOT DETECTED NOT DETECTED Final   Coronavirus OC43 DETECTED (A) NOT DETECTED Final   Metapneumovirus NOT DETECTED NOT DETECTED Final   Rhinovirus / Enterovirus NOT DETECTED NOT DETECTED Final   Influenza A NOT DETECTED NOT DETECTED Final   Influenza B NOT DETECTED NOT DETECTED Final   Parainfluenza Virus 1 NOT DETECTED NOT DETECTED Final   Parainfluenza Virus 2 NOT DETECTED NOT DETECTED Final   Parainfluenza Virus 3 NOT DETECTED NOT DETECTED Final   Parainfluenza  Virus 4 NOT DETECTED NOT DETECTED Final   Respiratory Syncytial Virus NOT DETECTED NOT DETECTED Final   Bordetella pertussis NOT DETECTED NOT DETECTED Final   Bordetella Parapertussis NOT DETECTED NOT DETECTED Final   Chlamydophila pneumoniae NOT DETECTED NOT DETECTED Final   Mycoplasma pneumoniae NOT DETECTED NOT DETECTED Final    Comment: Performed at [facility], [address] [place], [place] [address]    Radiology Studies: DG CHEST PORT 1 VIEW Result Date: 02/15/2023 CLINICAL DATA:  63 year old male with history of shortness of breath and tachycardia. EXAM: PORTABLE CHEST 1 VIEW COMPARISON:  Chest x-ray 02/11/2023. FINDINGS: There is a right upper extremity PICC with tip terminating in the superior cavoatrial junction. Lung volumes are very low. Patchy multifocal  interstitial prominence an ill-defined airspace disease in the lungs bilaterally, most confluent in the left mid to lower lung. Small bilateral pleural effusions. No pneumothorax. Pulmonary vasculature does not appear engorged. Heart size appears mildly enlarged. The patient is rotated to the right on today's exam, resulting in distortion of the mediastinal contours and reduced diagnostic sensitivity and specificity for mediastinal pathology. IMPRESSION: 1. Support apparatus, as above. 2. The appearance of the chest is concerning for multilobar bilateral pneumonia, most confluent in the left mid to lower lung. 3. Small bilateral pleural effusions. 4. Mild cardiomegaly. Electronically Signed   By: Alexandria Angel M.D.   On: 02/15/2023 07:15   Scheduled Meds:  apixaban   5 mg Oral BID   feeding supplement  237 mL Oral BID BM   guaiFENesin   1,200 mg Oral BID   levalbuterol   0.63 mg Nebulization Q6H   midodrine   15 mg Oral TID WC   pantoprazole   40 mg Oral BID   sodium chloride  flush  10-40 mL Intracatheter Q12H   sodium chloride  flush  3 mL Intravenous Q12H   thiamine   100 mg Oral Daily   Continuous Infusions:  amiodarone  30 mg/hr (02/15/23 1821)   azithromycin      cefTRIAXone  (ROCEPHIN )  IV     milrinone  0.25 mcg/kg/min (02/15/23 1712)    LOS: 6 days   Aura Leeds, DO [facility] Available via Epic secure chat 7am-7pm After these hours, please refer to coverage provider listed on [email or web address] 02/15/2023, 6:39 PM

## 2023-02-15 NOTE — Progress Notes (Signed)
 Mobility Specialist Progress Note;   02/15/23 1525  Mobility  Activity Transferred from chair to bed  Level of Assistance +2 (takes two people) (ModA)  Assistive Device Stedy  Activity Response Tolerated fair  Mobility Referral Yes  Mobility visit 1 Mobility  Mobility Specialist Start Time (ACUTE ONLY) 1525  Mobility Specialist Stop Time (ACUTE ONLY) 1535  Mobility Specialist Time Calculation (min) (ACUTE ONLY) 10 min   NT requesting assistance transferring pt back to bed, pt agreeable to mobility. Required ModA +2 to stand from recliner and transfer pt to bed via stedy. VSS on 2LO2. No c/o during transfer. Pt left comfortably in bed with all needs met. NT in room.   Janit Meline Mobility Specialist Please contact via SecureChat or [employer] [phone]

## 2023-02-15 NOTE — Progress Notes (Signed)
 Heart Failure Stewardship Pharmacist Progress Note   PCP: Jenelle Mis, FNP PCP-Cardiologist: Teddie Favre, MD    HPI:  Justin Fleming is 69 YOM with PMH CHF, AF, HTN, gout, alcohol abuse, and depression  Patient recently had been admitted to a detox facility prior to visiting his PCP to establish care. At the visit with PCP on 2/3, patient demonstrated DOE, tachycardic, and was febrile. EKG obtained showed AF RVR prompting referral to ED. Upon arrival, pt states he had been experiencing SOB for 1-2 weeks with chills and weakness. He denied chest pain or palpitations. He was alert and oriented with no reported changes in appetite. Physical examination showed 3-4+ bilateral LEE to mid thigh, JVD to earlobe, and reduced breathing sounds observed. Labs obtained BNP 570.9, flat troponin, lactate 1.4, Scr 1.49, BUN 15, Na 142, K 3.7. CXR showed bilateral atelectasis in lung bases, cardiac enlargement, small R pleural effusion. He was given lasix  40 mg IV x1 and started on amiodarone  IV gtt. ECHO on 2/4 LVEF 30-35% (prev 55-60%,12/2022), LV global hypokinesis, moderately reduced RV function, moderate bilateral enlargement, mild-moderate MV regurgitation.  Patient underwent successful TEE/DCCV on 2/6, and was converted to NSR with HR 70s. Post-procedure with poor urinary output, AMS, hypotensive, and slightly worse renal function. Concerned for low output. Lactic acid 1.6. PICC placed and initial coox was 54%. Started on milrinone  with improvements in coox to 73%.  Pt reports he is feeling ok. States he has pain in his arm and legs/feet from the swelling. Legs are warm. Coox 64% on milrinone . Breathing is ok but he has not been up yet to see if he get short of breath on exertion. Patient has not tried to lay flat yet. Remains on 2L oxygen. BP low 90/70s and HR now up to 100s on midodrine  15 mg TID.  Denies palpitations or chest pain. Cardiology plans for possible [facility] tomorrow if renal function  stable.   Current HF Medications: Diuretic: furosemide  80 mg IV BID Other: milrinone  0.25 mcg/kg/min; midodrine  15 mg TID  Prior to admission HF Medications: Diuretic: furosemide  40 mg oral daily  Pertinent Lab Values: Serum creatinine 1.61, BUN 19, Potassium 4.0, Sodium 133, Magnesium  1.7 2/3: BNP 570.9>964.6  Vital Signs: Weight: 183 lbs (admission weight: 198 lbs) Blood pressure: 90-100/70s Heart rate: 90-100 bpm  I/O: net -0.9 L yesterday; net -7.7 L since admission  Medication Assistance / Insurance Benefits Check: Does the patient have prescription insurance?  Yes Type of insurance plan: Medicare Advantage   Does the patient qualify for medication assistance through manufacturers or grants?   Pending  Outpatient Pharmacy:  Prior to admission outpatient [facility] [place], [place] on [address] Is the patient willing to use [facility] TOC pharmacy at discharge? Pending Is the patient willing to transition their outpatient pharmacy to utilize a [facility] outpatient pharmacy?   Pending    Assessment: 1. 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 NYHA class 2-3 symptoms. - Patient is hypervolemic upon exam with 1+ pitting LEE and requiring 2L supplemental oxygen. Has not yet tried to lay flat. Strict I/Os and daily weights. May get [facility] tomorrow to confirm fluid status.  - Continue diuresis with furosemide  80 mg IV BID - Continue milrinone  0.25 mcg/kg/min - coox 64 today.  - Continue to hold BB while on milrinone  - Hold RAASi, MRA, and SGLT2i with low BP requiring midodrine  and poor renal function. - Low Mag (  1.7). stable from yesterday when replaced with 2g IV   Plan: 1) Medication changes recommended at this time: - Give Mag sulfate IV 4 grams  2) Patient assistance: - Patient has Medicare Advantage plan - Plan to discuss prescription access with patient prior to discharge  3)  Education  - To be  completed prior to discharge  Jerilyn Monte, PharmD, BCPS Heart Failure Stewardship Pharmacist Phone [phone]

## 2023-02-15 NOTE — TOC Progression Note (Addendum)
 Transition of Care [facility]) - Progression Note    Patient Details  Name: Justin Fleming MRN: [id number] Date of Birth: 01-20-1960  Transition of Care [facility]) CM/SW Contact  Carmon Christen, LCSWA Phone Number: 02/15/2023, 11:50 AM  Clinical Narrative:     CSW spoke with patient regarding SNF bed offers. Patient request for CSW to give Adriana Hopping his sister a call to help with SNF choice. Patient gave CSW [facility] telephone number [phone]. CSW called Adriana Hopping and provided SNF bed offers. Mary informed CSW that SNF choice would be between [facility] and [facility]. CSW informed Patient. Patient chose SNF bed at [facility]. All questions answered. No further questions reported at this time. CSW awaiting response from [place] with [facility] to confirm SNF bed for patient. CSW following to start insurance authorization closer to patient being medically ready for dc.  Expected Discharge Plan:  (skilled vrs.home agreeable to fax out for snf) Barriers to Discharge: Continued Medical Work up  Expected Discharge Plan and Services In-house Referral: Clinical Social Work     Living arrangements for the past 2 months: Single Family Home                                       Social Determinants of Health (SDOH) Interventions SDOH Screenings   Food Insecurity: No Food Insecurity (02/08/2023)  Housing: Low Risk  (02/08/2023)  Transportation Needs: No Transportation Needs (02/08/2023)  Utilities: Not At Risk (02/08/2023)  Alcohol Screen: Low Risk  (02/02/2018)  Depression (PHQ2-9): Low Risk  (02/02/2018)  Social Connections: Socially Isolated (02/08/2023)  Tobacco Use: Medium Risk (02/08/2023)    Readmission Risk Interventions    01/11/2023   12:44 PM  Readmission Risk Prevention Plan  Transportation Screening Complete  PCP or Specialist Appt within 5-7 Days Not Complete  Home Care Screening Complete  Medication Review (RN CM) Complete

## 2023-02-15 NOTE — Progress Notes (Addendum)
   Patient Name: Justin Fleming Date of Encounter: 02/15/2023 [facility] Cardiologist: Teddie Favre, MD   Interval Summary  .    Patient reports feeling much better today. States LE edema has gone down significantly, almost to baseline Reports breathing much improved , currently on 2 L oxygen via [place]  Co-ox today came back 63.9, BNP 964 Denies any chest pain, palpitations, shortness of breath at rest Patient tachycardic this morning with HR 108-112  Vital Signs .    Vitals:   02/14/23 1954 02/15/23 0325 02/15/23 0805 02/15/23 0909  BP: 106/68 108/73 98/68 90/62   Pulse: 99 96 (!) 103   Resp: 16 16 19    Temp: 99.7 F (37.6 C) 98.3 F (36.8 C) 99.8 F (37.7 C)   TempSrc: Oral Oral Oral   SpO2: 95% 97% 93%   Weight:  83.2 kg    Height:       Intake/Output Summary (Last 24 hours) at 02/15/2023 0924 Last data filed at 02/15/2023 0806 Gross per 24 hour  Intake 936.55 ml  Output 2500 ml  Net -1563.45 ml      02/15/2023    3:25 AM 02/14/2023    5:46 AM 02/13/2023    3:40 AM  Last 3 Weights  Weight (lbs) 183 lb 6.8 oz 190 lb 14.7 oz 191 lb 12.8 oz  Weight (kg) 83.2 kg 86.6 kg 87 kg     Telemetry/ECG    Sinus rhythm with HR 100-110s - Personally Reviewed  Physical Exam .   GEN: No acute distress, on 2 L oxygen via  Neck: No JVD Cardiac: regular rhythm, tachycardic, no murmurs, rubs, or gallops.  Respiratory: bibasilar crackles present. GI: Soft, nontender, non-distended  MS: trace LE edema, patient reports much improved from admission   Assessment & Plan .     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  Presented significantly volume overloaded on admission BNP 570 on admission, 964 this morning  CXR today showed possible multilobar PNA  (left mid to lower lung), small bilateral pleural effusions, mild cardiomegaly  WBC high normal 10.3 today   TEE 2/6 showed LVEF 30-35%, mild MR, mild to moderate TR, trivial AR, trivial PR Net output -7 L this admission - 2 L last 24 hours Weight 183 lb today, down from 198 lb on admission Patient overall feeling better, stating he feels almost back to baseline regarding his LE edema  Creatinine raised to 1.61 today from 1.30 yesterday but has been around 1.4-1.5 this admission Co-ox today came back 63.9 from 81.3 Continue IV Lasix  80 mg x 1 dose this morning and then re-assess Continue IV milrinone , will attempt to wean tomorrow  Patient will likely need RHC to assess volume status, possible LHC as well as he has not had a proper ischemic workup RHC/LHC tomorrow if renal function stable    Hypotension Most recent BP 98/68 with HR 103  GDMT has been limited by hypotension  Continue midodrine  15 mg TID  Per primary AKI on CKD stage 3a URI Electrolyte disturbances  Alcohol abuse Alcohol induced fatty liver Chronic anemia   For questions or updates, please contact [facility] Please consult [email or web address] for contact info under       Signed, Jiles Mote, PA-C

## 2023-02-15 NOTE — Plan of Care (Signed)
  Problem: Education: Goal: Knowledge of General Education information will improve Description: Including pain rating scale, medication(s)/side effects and non-pharmacologic comfort measures Outcome: Progressing   Problem: Clinical Measurements: Goal: Ability to maintain clinical measurements within normal limits will improve Outcome: Progressing Goal: Will remain free from infection Outcome: Progressing Goal: Diagnostic test results will improve Outcome: Progressing Goal: Respiratory complications will improve Outcome: Progressing Goal: Cardiovascular complication will be avoided Outcome: Progressing   Problem: Activity: Goal: Risk for activity intolerance will decrease Outcome: Progressing   Problem: Nutrition: Goal: Adequate nutrition will be maintained Outcome: Progressing   Problem: Coping: Goal: Level of anxiety will decrease Outcome: Progressing   Problem: Elimination: Goal: Will not experience complications related to bowel motility Outcome: Progressing Goal: Will not experience complications related to urinary retention Outcome: Progressing   Problem: Pain Managment: Goal: General experience of comfort will improve and/or be controlled Outcome: Progressing   Problem: Safety: Goal: Ability to remain free from injury will improve Outcome: Progressing   Problem: Skin Integrity: Goal: Risk for impaired skin integrity will decrease Outcome: Progressing   Problem: Education: Goal: Ability to demonstrate management of disease process will improve Outcome: Progressing Goal: Ability to verbalize understanding of medication therapies will improve Outcome: Progressing Goal: Individualized Educational Video(s) Outcome: Progressing   Problem: Activity: Goal: Capacity to carry out activities will improve Outcome: Progressing   Problem: Education: Goal: Knowledge of disease or condition will improve Outcome: Progressing Goal: Understanding of medication  regimen will improve Outcome: Progressing Goal: Individualized Educational Video(s) Outcome: Progressing   Problem: Activity: Goal: Ability to tolerate increased activity will improve Outcome: Progressing   Problem: Cardiac: Goal: Ability to achieve and maintain adequate cardiopulmonary perfusion will improve Outcome: Progressing   Problem: Health Behavior/Discharge Planning: Goal: Ability to safely manage health-related needs after discharge will improve Outcome: Progressing

## 2023-02-16 ENCOUNTER — Inpatient Hospital Stay (HOSPITAL_COMMUNITY): Payer: Medicare Other

## 2023-02-16 DIAGNOSIS — F1010 Alcohol abuse, uncomplicated: Secondary | ICD-10-CM | POA: Diagnosis not present

## 2023-02-16 DIAGNOSIS — K700 Alcoholic fatty liver: Secondary | ICD-10-CM | POA: Diagnosis not present

## 2023-02-16 DIAGNOSIS — R7989 Other specified abnormal findings of blood chemistry: Secondary | ICD-10-CM | POA: Diagnosis not present

## 2023-02-16 DIAGNOSIS — I4891 Unspecified atrial fibrillation: Secondary | ICD-10-CM | POA: Diagnosis not present

## 2023-02-16 LAB — BRAIN NATRIURETIC PEPTIDE: B Natriuretic Peptide: 943.3 pg/mL — ABNORMAL HIGH (ref 0.0–100.0)

## 2023-02-16 LAB — COMPREHENSIVE METABOLIC PANEL
ALT: 11 U/L (ref 0–44)
AST: 23 U/L (ref 15–41)
Albumin: 2.1 g/dL — ABNORMAL LOW (ref 3.5–5.0)
Alkaline Phosphatase: 52 U/L (ref 38–126)
Anion gap: 15 (ref 5–15)
BUN: 21 mg/dL (ref 8–23)
CO2: 36 mmol/L — ABNORMAL HIGH (ref 22–32)
Calcium: 8.6 mg/dL — ABNORMAL LOW (ref 8.9–10.3)
Chloride: 85 mmol/L — ABNORMAL LOW (ref 98–111)
Creatinine, Ser: 1.35 mg/dL — ABNORMAL HIGH (ref 0.61–1.24)
GFR, Estimated: 58 mL/min — ABNORMAL LOW (ref >60)
Glucose, Bld: 112 mg/dL — ABNORMAL HIGH (ref 70–99)
Potassium: 3.6 mmol/L (ref 3.5–5.1)
Sodium: 136 mmol/L (ref 135–145)
Total Bilirubin: 1.1 mg/dL (ref 0.0–1.2)
Total Protein: 5.8 g/dL — ABNORMAL LOW (ref 6.5–8.1)

## 2023-02-16 LAB — URINALYSIS, COMPLETE (UACMP) WITH MICROSCOPIC
Bilirubin Urine: NEGATIVE
Glucose, UA: NEGATIVE mg/dL
Hgb urine dipstick: NEGATIVE
Ketones, ur: NEGATIVE mg/dL
Leukocytes,Ua: NEGATIVE
Nitrite: NEGATIVE
Protein, ur: 30 mg/dL — AB
Specific Gravity, Urine: 1.02 (ref 1.005–1.030)
pH: 5 (ref 5.0–8.0)

## 2023-02-16 LAB — COOXEMETRY PANEL
Carboxyhemoglobin: 3.8 % — ABNORMAL HIGH (ref 0.5–1.5)
Methemoglobin: 1.3 % (ref 0.0–1.5)
O2 Saturation: 78.4 %
Total hemoglobin: 8.4 g/dL — ABNORMAL LOW (ref 12.0–16.0)

## 2023-02-16 LAB — CBC WITH DIFFERENTIAL/PLATELET
Abs Immature Granulocytes: 0.1 10*3/uL — ABNORMAL HIGH (ref 0.00–0.07)
Basophils Absolute: 0.1 10*3/uL (ref 0.0–0.1)
Basophils Relative: 0 %
Eosinophils Absolute: 0.2 10*3/uL (ref 0.0–0.5)
Eosinophils Relative: 2 %
HCT: 25.4 % — ABNORMAL LOW (ref 39.0–52.0)
Hemoglobin: 8.1 g/dL — ABNORMAL LOW (ref 13.0–17.0)
Immature Granulocytes: 1 %
Lymphocytes Relative: 11 %
Lymphs Abs: 1.3 10*3/uL (ref 0.7–4.0)
MCH: 29.8 pg (ref 26.0–34.0)
MCHC: 31.9 g/dL (ref 30.0–36.0)
MCV: 93.4 fL (ref 80.0–100.0)
Monocytes Absolute: 1.9 10*3/uL — ABNORMAL HIGH (ref 0.1–1.0)
Monocytes Relative: 16 %
Neutro Abs: 8.8 10*3/uL — ABNORMAL HIGH (ref 1.7–7.7)
Neutrophils Relative %: 70 %
Platelets: 409 10*3/uL — ABNORMAL HIGH (ref 150–400)
RBC: 2.72 MIL/uL — ABNORMAL LOW (ref 4.22–5.81)
RDW: 18.4 % — ABNORMAL HIGH (ref 11.5–15.5)
WBC: 12.3 10*3/uL — ABNORMAL HIGH (ref 4.0–10.5)
nRBC: 0 % (ref 0.0–0.2)

## 2023-02-16 LAB — LACTIC ACID, PLASMA
Lactic Acid, Venous: 0.9 mmol/L (ref 0.5–1.9)
Lactic Acid, Venous: 1.6 mmol/L (ref 0.5–1.9)

## 2023-02-16 LAB — PHOSPHORUS: Phosphorus: 4.1 mg/dL (ref 2.5–4.6)

## 2023-02-16 LAB — MAGNESIUM: Magnesium: 2.2 mg/dL (ref 1.7–2.4)

## 2023-02-16 MED ORDER — IPRATROPIUM BROMIDE 0.02 % IN SOLN: 0.5000 mg | Freq: Four times a day (QID) | RESPIRATORY_TRACT | Status: DC | PRN

## 2023-02-16 MED ORDER — LEVALBUTEROL HCL 0.63 MG/3ML IN NEBU: 0.6300 mg | INHALATION_SOLUTION | Freq: Four times a day (QID) | RESPIRATORY_TRACT | Status: DC

## 2023-02-16 MED ORDER — LEVALBUTEROL HCL 0.63 MG/3ML IN NEBU: 0.6300 mg | INHALATION_SOLUTION | Freq: Two times a day (BID) | RESPIRATORY_TRACT | Status: DC

## 2023-02-16 MED ORDER — IPRATROPIUM BROMIDE 0.02 % IN SOLN: 0.5000 mg | Freq: Four times a day (QID) | RESPIRATORY_TRACT | Status: DC

## 2023-02-16 MED ORDER — POTASSIUM CHLORIDE CRYS ER 20 MEQ PO TBCR
40.0000 meq | EXTENDED_RELEASE_TABLET | Freq: Once | ORAL | Status: AC
  Administered 2023-02-16: 40 meq via ORAL
  Filled 2023-02-16: qty 2

## 2023-02-16 MED ORDER — LEVALBUTEROL HCL 0.63 MG/3ML IN NEBU
0.6300 mg | INHALATION_SOLUTION | Freq: Four times a day (QID) | RESPIRATORY_TRACT | Status: DC
  Administered 2023-02-16 (×2): 0.63 mg via RESPIRATORY_TRACT
  Filled 2023-02-16 (×2): qty 3

## 2023-02-16 MED ORDER — DOXYCYCLINE HYCLATE 100 MG PO TABS
100.0000 mg | ORAL_TABLET | Freq: Two times a day (BID) | ORAL | Status: DC
  Administered 2023-02-16 – 2023-02-20 (×8): 100 mg via ORAL
  Filled 2023-02-16 (×8): qty 1

## 2023-02-16 NOTE — Progress Notes (Signed)
Patient Name: Justin Fleming Date of Encounter: 02/16/2023 [facility] Cardiologist: Nona Dell, MD   Interval Summary  .    Patient resting in the bed, on 3 L oxygen via [place] Overnight he developed a fever, this AM labs shows leukocytosis (12.3) and CXR concerning for multifocal PNA, primary team is aware and treating  Will push back RHC/LHC for now and continue to medically manage   Vital Signs .    Vitals:   02/16/23 0748 02/16/23 0805 02/16/23 0814 02/16/23 1141  BP: (!) 83/59 (!) 88/63  94/66  Pulse: 83  84 88  Resp: 16  17 16   Temp: 98.1 F (36.7 C)   98.4 F (36.9 C)  TempSrc: Oral   Oral  SpO2: 98%   97%  Weight:      Height:       Intake/Output Summary (Last 24 hours) at 02/16/2023 1357 Last data filed at 02/16/2023 1212 Gross per 24 hour  Intake 1320.8 ml  Output 1400 ml  Net -79.2 ml      02/16/2023    2:48 AM 02/15/2023    3:25 AM 02/14/2023    5:46 AM  Last 3 Weights  Weight (lbs) 184 lb 1.4 oz 183 lb 6.8 oz 190 lb 14.7 oz  Weight (kg) 83.5 kg 83.2 kg 86.6 kg    Telemetry/ECG    Sinus rhythm, HR 80s - Personally Reviewed  Physical Exam .   GEN: No acute distress, on 3 L oxygen via [place] Neck: No JVD Cardiac: RRR, no murmurs, rubs, or gallops.  Respiratory: Diminished breath sounds bilaterally, normal respiratory effort  GI: Soft, nontender, non-distended  MS: trace bilateral LE edema  Assessment & Plan .     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  Presented significantly volume overloaded on admission BNP 570 on admission, 964 this morning  CXR today not yet read but looks very similar to yesterdays which showed possible multilobar PNA (left mid to lower lung), small  bilateral pleural effusions, mild cardiomegaly  WBC 12.3 today, with two temperatures 101.7 last night, known URI TEE 2/6 showed LVEF 30-35%, mild MR, mild to moderate TR, trivial AR, trivial PR Net output - 9.3 L this admission - 3.7 L last 24 hrs with IV Lasix 80 mg x 1 Weight 184 lb today, down from 198 lb on admission Patient overall feeling better, stating he feels almost back to baseline regarding his LE edema  Currently on 3 L oxygen via [place] Creatinine down to 1.3 from 1.6 yesterday  Co-ox today came back 78.4 up from 63.9 Will discuss with MD plans for further diuresis but likely to hold in the setting of possible PNA/infection/sepsis  Continue IV milrinone, will attempt to wean tomorrow  Patient will likely need RHC to assess volume status, possible LHC as well as he has not had a proper ischemic workup Considering recent high temps, elevated white count and possible PNA will push out cath for now   Hypotension Most recent BP 88/63 with HR 84  GDMT has been limited by hypotension  Continue midodrine 15 mg TID   Per primary AKI on CKD stage 3a URI Possible PNA Electrolyte disturbances  Alcohol abuse Alcohol induced fatty liver Chronic anemia  For questions or updates, please contact [facility] Please consult [email or web address] for contact info under        Signed, Olena Leatherwood, PA-C

## 2023-02-16 NOTE — Progress Notes (Addendum)
Physical Therapy Treatment Patient Details Name: Justin Fleming MRN: [id number] DOB: [birth year] Today's Date: 02/16/2023   History of Present Illness 63 y.o. male presented 02/08/23 with tachycardia, febrile, upper respiratory symptoms. +COVID  Afib with RVR   PMH significant of a-fib, CHF, gout, GERD, alcohol abuse.    PT Comments  Pt pleasant and agreeable to PT session. He demonstrated improved bed mobility requiring less physical assistance for supine>sit. Pt continues to require increase time to complete tasks and VC/TC for proper sequencing and positioning. He was able to achieve erect position when standing using RW and was CGA for static stance. Pt completed bed>chair transfer using RW with modA x2. Pt engaged in BLE strengthening by performing LAQs while seated in recliner chair. He demonstrated difficulty with L>R and was unable to achieve full knee ext on either leg. Patient will benefit from continued inpatient follow up therapy, <3 hours/day.     If plan is discharge home, recommend the following: Two people to help with walking and/or transfers;Assistance with cooking/housework;Assist for transportation;Help with stairs or ramp for entrance;A lot of help with bathing/dressing/bathroom   Can travel by private vehicle     No  Equipment Recommendations  None recommended by PT (Pt already has DME)    Recommendations for Other Services       Precautions / Restrictions Precautions Precautions: Fall Restrictions Weight Bearing Restrictions Per Provider Order: No     Mobility  Bed Mobility Overal bed mobility: Needs Assistance Bed Mobility: Supine to Sit     Supine to sit: HOB elevated, Used rails, Min assist     General bed mobility comments: Pt sat up on the L side of bed, able to advance RLE, but required minA for LLE. Cued pt to use handrails and reach across his body. MinA at trunk to sit upright. Pt required increased time to scoot EOB til feet were supported.     Transfers Overall transfer level: Needs assistance Equipment used: Rolling walker (2 wheels) Transfers: Sit to/from Stand, Bed to chair/wheelchair/BSC Sit to Stand: Mod assist, +2 safety/equipment, From elevated surface   Step pivot transfers: Mod assist, +2 safety/equipment       General transfer comment: STS from EOB at a raised height, modA x2 to power up. Once standing pt took increase time and VC/TC to achieve erect position. Pt performed L bed>chair transfer with short steps and intermittent VC for sequencing. Poor eccentric control observed with sitting likely d/t fatigue.    Ambulation/Gait Ambulation/Gait assistance: Mod assist, +2 safety/equipment Gait Distance (Feet): 3 Feet Assistive device: Rolling walker (2 wheels) Gait Pattern/deviations: Step-to pattern, Decreased stance time - left, Shuffle   Gait velocity interpretation: <1.31 ft/sec, indicative of household ambulator   General Gait Details: Pt took short slow steps without floor clearence to recliner chair. VC/TC throughout for sequencing. Pt maintained himself inside RW at all times and was able to manuever it during the transfer.   Stairs             Wheelchair Mobility     Tilt Bed    Modified Rankin (Stroke Patients Only)       Balance Overall balance assessment: Needs assistance Sitting-balance support: Bilateral upper extremity supported, Feet supported Sitting balance-Leahy Scale: Fair Sitting balance - Comments: Pt sat EOB with supervision   Standing balance support: Bilateral upper extremity supported, Reliant on assistive device for balance, During functional activity Standing balance-Leahy Scale: Poor Standing balance comment: Pt requires RW for stability with OOB mobility. Pt takes  increased time to achieve erect posture with VC/TC to increase hip ext and dec fwd flex. No overt LOB.                            Communication Communication Communication: No apparent  difficulties  Cognition Arousal: Alert Behavior During Therapy: WFL for tasks assessed/performed   PT - Cognitive impairments: No apparent impairments                         Following commands: Intact      Cueing Cueing Techniques: Verbal cues, Tactile cues  Exercises General Exercises - Lower Extremity Long Arc Quad: Seated, Both, 15 reps, Strengthening (with 5 second hold at top)    General Comments General comments (skin integrity, edema, etc.): Asymptomatic hypotension, RN gave medication prior to start of session.      Pertinent Vitals/Pain Pain Assessment Pain Assessment: 0-10 Pain Score: 4  Pain Location: L medial knee Pain Descriptors / Indicators: Aching, Discomfort, Sore Pain Intervention(s): Monitored during session    Home Living                          Prior Function            PT Goals (current goals can now be found in the care plan section) Acute Rehab PT Goals Patient Stated Goal: Move easier Progress towards PT goals: Progressing toward goals    Frequency    Min 1X/week      PT Plan      Co-evaluation              AM-PAC PT "6 Clicks" Mobility   Outcome Measure  Help needed turning from your back to your side while in a flat bed without using bedrails?: A Little Help needed moving from lying on your back to sitting on the side of a flat bed without using bedrails?: A Lot Help needed moving to and from a bed to a chair (including a wheelchair)?: Total Help needed standing up from a chair using your arms (e.g., wheelchair or bedside chair)?: Total Help needed to walk in hospital room?: Total Help needed climbing 3-5 steps with a railing? : Total 6 Click Score: 9    End of Session Equipment Utilized During Treatment: Gait belt;Oxygen Activity Tolerance: Patient limited by fatigue;Patient limited by pain Patient left: in chair;with call bell/phone within reach;with chair alarm set Nurse Communication: Mobility  status PT Visit Diagnosis: Unsteadiness on feet (R26.81);Other abnormalities of gait and mobility (R26.89);Muscle weakness (generalized) (M62.81);Difficulty in walking, not elsewhere classified (R26.2) Pain - Right/Left: Left Pain - part of body: Knee     Time: 9528-4132 PT Time Calculation (min) (ACUTE ONLY): 24 min  Charges:    $Therapeutic Exercise: 8-22 mins $Therapeutic Activity: 8-22 mins PT General Charges $$ ACUTE PT VISIT: 1 Visit                     Cheri Guppy, PT, DPT Acute Rehabilitation Services Office: [phone] Secure Chat Preferred    Richardson Chiquito 02/16/2023, 4:18 PM

## 2023-02-16 NOTE — Progress Notes (Addendum)
Heart Failure Stewardship Pharmacist Progress Note   PCP: [name], FNP PCP-Cardiologist: Nona Dell, MD    HPI:  Justin Fleming is 89 YOM with PMH CHF, AF, HTN, gout, alcohol abuse, and depression  Patient recently had been admitted to a detox facility prior to visiting his PCP to establish care. At the visit with PCP on 2/3, patient demonstrated DOE, tachycardic, and was febrile. EKG obtained showed AF RVR prompting referral to ED. Upon arrival, pt states he had been experiencing SOB for 1-2 weeks with chills and weakness. He denied chest pain or palpitations. He was alert and oriented with no reported changes in appetite. Physical examination showed 3-4+ bilateral LEE to mid thigh, JVD to earlobe, and reduced breathing sounds observed. Labs obtained BNP 570.9, flat troponin, lactate 1.4, Scr 1.49, BUN 15, Na 142, K 3.7. CXR showed bilateral atelectasis in lung bases, cardiac enlargement, small R pleural effusion. He was given lasix 40 mg IV x1 and started on amiodarone IV gtt. ECHO on 2/4 LVEF 30-35% (prev 55-60%,12/2022), LV global hypokinesis, moderately reduced RV function, moderate bilateral enlargement, mild-moderate MV regurgitation.  Patient underwent successful TEE/DCCV on 2/6, and was converted to NSR with HR 70s. Post-procedure with poor urinary output, AMS, hypotensive, and slightly worse renal function. Concerned for low output. Lactic acid 1.6. PICC placed and initial coox was 54%. Started on milrinone with improvements in coox to 73%.  Pt reports he is feeling ok. States he still has pain in his arm and legs/feet from the swelling. Legs are warm. His urine output increased yesterday with diuresis (net negative -2.3 L yesterday). Coox 78% on milrinone 0.25 mcg/kg/min. Breathing is ok but he has not been up yet to see if he gets short of breath on exertion. Patient has not tried to lay flat yet. Remains on 2L oxygen. BP low 90/70s and HR now 90s on midodrine 15 mg TID.   Denies palpitations or chest pain. Cardiology plans to hold off on [facility] with new onset fever and concerns for possible PNA.  Current HF Medications: Other: milrinone 0.25 mcg/kg/min; midodrine 15 mg TID  Prior to admission HF Medications: Diuretic: furosemide 40 mg oral daily  Pertinent Lab Values: Serum creatinine 1.35, BUN 21, Potassium 3.6, Sodium 136, Magnesium 2.1 BNP 570.9 >964.6  Vital Signs: Weight: 183 lbs (admission weight: 198 lbs) Blood pressure: 90-100/60-70 mmHg Heart rate: 80-100 bpm  I/O: net -2.3 L yesterday; net -9.3 L since admission  Medication Assistance / Insurance Benefits Check: Does the patient have prescription insurance?  Yes Type of insurance plan: Medicare Advantage   Does the patient qualify for medication assistance through manufacturers or grants?   Pending  Outpatient Pharmacy:  Prior to admission outpatient pharmacy: [facility] [place], [place] on [address] Is the patient willing to use [facility] TOC pharmacy at discharge? Pending Is the patient willing to transition their outpatient pharmacy to utilize a [facility] outpatient pharmacy?   Pending    Assessment: 1. 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 NYHA class 2-3 symptoms. - Patient is hypervolemic upon exam with 1+ pitting LEE and requiring 2L supplemental oxygen. Has not yet tried to lay flat. Strict I/Os and daily weights. R/LHC being postponed with concerns for PNA given fever spike last 24hr.  - Continue diuresis with furosemide 80 mg IV BID - Continue milrinone 0.25 mcg/kg/min - coox 78% today.  - Hold BB while on milrinone - Hold RAASi,  MRA, and SGLT2i with low BP requiring midodrine and poor renal function. - Low K (3.6), needs replacement   Plan: 1) Medication changes recommended at this time: - Consider restarting furosemide 80 mg IV BID - Give potassium chloride 40 mEq PO x1  2) Patient assistance: - Patient has  Medicare Advantage plan - Plan to discuss prescription access with patient prior to discharge  3)  Education  - To be completed prior to discharge  Wilmer Floor, PharmD PGY2 Cardiology Pharmacy Resident Heart Failure Stewardship Pharmacist Phone [phone]

## 2023-02-16 NOTE — TOC Progression Note (Signed)
Transition of Care [facility]) - Progression Note    Patient Details  Name: Justin Fleming MRN: [id number] Date of Birth: Jan 24, 1960  Transition of Care [facility] CM/SW Contact  Delilah Shan, LCSWA Phone Number: 02/16/2023, 11:20 AM  Clinical Narrative:     Patient has SNF bed at [facility]. CSW following to start insurance authorization closer to patient being medically ready for dc. CSW will continue to follow and assist with patients dc planning needs.   Expected Discharge Plan:  (skilled vrs.home agreeable to fax out for snf) Barriers to Discharge: Continued Medical Work up  Expected Discharge Plan and Services In-house Referral: Clinical Social Work     Living arrangements for the past 2 months: Single Family Home                                       Social Determinants of Health (SDOH) Interventions SDOH Screenings   Food Insecurity: No Food Insecurity (02/08/2023)  Housing: Low Risk  (02/08/2023)  Transportation Needs: No Transportation Needs (02/08/2023)  Utilities: Not At Risk (02/08/2023)  Alcohol Screen: Low Risk  (02/02/2018)  Depression (PHQ2-9): Low Risk  (02/02/2018)  Social Connections: Socially Isolated (02/08/2023)  Tobacco Use: Medium Risk (02/08/2023)    Readmission Risk Interventions    01/11/2023   12:44 PM  Readmission Risk Prevention Plan  Transportation Screening Complete  PCP or Specialist Appt within 5-7 Days Not Complete  Home Care Screening Complete  Medication Review (RN CM) Complete

## 2023-02-16 NOTE — Progress Notes (Signed)
PROGRESS NOTE    Justin Fleming  ZOX:[id number] DOB: 06/14/1960 DOA: 02/08/2023 PCP: [name], FNP   Brief Narrative:  The patient is a 63 year old AAM with a past medical history significant for but not limited to atrial fibrillation, CHF, gout, GERD, history of alcohol abuse and other comorbidities who continues to drink significant alcohol.  Is admitted with A-fib with RVR tachypnea and worsening edema and echocardiogram revealed left ventricular ejection fraction of 30 to 35%, global hypokinesis of left ventricle and reduced right ventricular function with moderate enlargement.  He also was noted to have worsening renal function.  Currently is getting diuresis and cardiology's been consulted and he is on amiodarone drip for his uncontrolled heart rates with A-fib with RVR.  Respiratory virus panel was positive for coronavirus OC 43.  He is being diuresed and underwent TEE/DCCV today and has converted to NSR.   He was currently getting diuresis per Cardiology and had PICC line placed. Co-Ox came back at 64 and the medical Cardiolog team discussed with advanced heart failure team who recommended starting milrinone and getting CVP's.  Now he is getting diuresis IV Lasix 80 mg twice daily and is on the low normal drip.  Because he is on neurologic his p.o. amiodarone was changed back to an amiodarone drip.  Cardiology recommends continuing diuresis and following up electrolytes and once his fluid status is optimized they are recommending that he can likely wean off of milrinone and transition to divided doses of Lasix and oral Amiodarone.  Assessment and Plan:  Atrial Fibrillation with RVR [facility]) s/p TEE/DCCV and conversion to NSR -Cardiology input is appreciated  -Patient was on amiodarone drip and transition to oral amiodarone as below however it has been changed patient back to amiodarone drip given that he is being started on milrinone -Cardiology team plans to add beta-blockers and he  is now on metoprolol tartrate 25 mg p.o. twice daily and cardiology recommends transitioning to Toprol-XL prior to discharge given his reduced EF -Continue anticoagulation with Apixaban for now. -TSH was 2.743 -Echocardiogram reveals new cardiomyopathy, probably tachycardia induced. -Cardiology took patient for TEE/DCCV 02/11/23 and it was done; Findings on TEE showed "Moderate reduced LV function, Mild MR,+PFO,Negative study for LAA thrombus"and he is now in Sinus Rhythm -He was on a p.o. Taper of Amiodarone 200 mg twice daily for next week and then he will go to 200 mg daily after however he was changed back to IV amiodarone given that he is being switched to IV Amiodarone given that the milrinone drip is proarrhythmic.  Per cardiology recommending switching back to oral once he is off of Milrinone   Acute on Chronic Systolic CHF/Cardiomyopathy: -Cardiology team is directing care given that there is concern for low output heart failure.. -He is status post TEE/DCCV -Continues to be markedly volume overloaded and BNP was elevated 570 on admission and is now 964.6 -> 943.3 -Echocardiogram done and showed an LVEF of 30 to 35% with global hypokinesis, moderately reduced right ventricular function, moderate biatrial enlargement and mild to moderate MR; this EF is down from 50 to 55% back in December Intake/Output Summary (Last 24 hours) at 02/16/2023 1637 Last data filed at 02/16/2023 1513 Gross per 24 hour  Intake 1784.05 ml  Output 1800 ml  Net -15.95 ml   -He continues to be volume overloaded so cardiology increased his Lasix dose to 80 mg IV  and placed a PICC line for Co-ox; Given his Low Co-ox (54.5) he was started  on a Milrinone gtt; Now Co-Ox is 63.9 today -Continue further care per Cardiology and were are recommending continuing the IV Lasix 80 mg twice daily with potassium supplementation and Midodrine to support blood pressures and feel that his GDMT is limited otherwise.; Today his Diuresis  was dropped to just IV 80 mg Daily -Cardiology recommends attempting to wean oxygen and stopping milrinone in next 24 to 48 hours and continue daily Co-Ox while PICC is in place and cardiology is proposing doing a right heart cath to assess his volume status and left heart cath to have a proper ischemic workup however this has been deferred given his fever and worsening leukocytosis  Alcohol Abuse, Continuous -Counseled to quit alcohol. -CIWA Protocol was initiated and appears to be out of withdrawals -Low-dose to try Librium 25 Mg p.o. every 6 hourly x 4 doses and reviewed.   -Patient had remained significantly tachycardic, despite amiodarone drip but HR's are improving and he is now in NSR and not Tachycardic at all   Acute Respiratory Failure with Hypoxia -In the setting of Coronavirus and CHF -SpO2: 96 % O2 Flow Rate (L/min): 3 L/min -Continue Diuresis per Cards -C/w Abx and Nebs as below -Continuous Pulse Oximetry and Maintain O2 Sats >90% -Continue supplemental oxygen via nasal cannula wean O2 as tolerated -Repeat chest x-ray in a.m. and will need an Ambulatory Home O2 screen prior to discharge  Chronic Normocytic Anemia, stable  -Likely Multifactorial. Hgb/Hct Trend: Recent Labs  Lab 02/10/23 0455 02/11/23 0439 02/12/23 0931 02/13/23 0537 02/14/23 0622 02/15/23 0409 02/16/23 0420  HGB 9.3* 9.1* 8.9* 8.1* 8.3* 8.3* 8.1*  HCT 28.7* 28.4* 27.9* 24.8* 25.5* 25.7* 25.4*  MCV 95.7 95.3 94.9 94.7 93.8 93.5 93.4  -Obtained Anemia Panel with iron level of 18, UIBC 219, TIBC of 237, saturation ratios of 8%, ferritin low 3-5, folate level 32.6 and a vitamin B12 432 -Transfuse for Hg <7 ,rapidly dropping or  if symptomatic -Continue to monitor for signs and symptoms bleeding; no overt bleeding noted   Hypokalemia -Patient's K+ Level Trend: Recent Labs  Lab 02/10/23 0455 02/11/23 0439 02/12/23 0930 02/13/23 0537 02/14/23 0622 02/15/23 0409 02/16/23 0420  K 3.4* 3.8 3.8 3.2*  3.8 4.0 3.6  -Replete with po Kcl 40 mEQ x1 again -Continue to Monitor and Replete as Necessary -Repeat CMP in the AM   Hypomagnesemia -Patient's Mag Level Trend: Recent Labs  Lab 02/10/23 0455 02/11/23 0439 02/12/23 0931 02/13/23 0537 02/14/23 0622 02/15/23 0409 02/16/23 0420  MG 1.2* 1.9 2.0 1.6* 1.7 1.7 2.2  -Continue to Monitor and Replete as Necessary -Repeat Mag in the AM   AKI vs CKD stage IIIa -BUN/Cr Trend: Recent Labs  Lab 02/10/23 0455 02/11/23 0439 02/12/23 0930 02/13/23 0537 02/14/23 0622 02/15/23 0409 02/16/23 0420  BUN 16 19 24* 23 21 19 21   CREATININE 1.40* 1.47* 1.46* 1.41* 1.30* 1.61* 1.35*  -Avoid Nephrotoxic Medications, Contrast Dyes, Hypotension and Dehydration to Ensure Adequate Renal Perfusion and will need to Renally Adjust Meds -Continue to Monitor and Trend Renal Function carefully and repeat CMP in the AM   Hyponatremia, fluctuating  -Mild and likely in the setting of volume overload. Na+ Trend: Recent Labs  Lab 02/10/23 0455 02/11/23 0439 02/12/23 0930 02/13/23 0537 02/14/23 0622 02/15/23 0409 02/16/23 0420  NA 134* 135 136 137 135 133* 136  -Continue monitor trend and repeat CMP in a.m.   Elevated Troponin -In the setting of tachycardia -Troponin went from 68 -> 67 -Continue to monitor  -  Cardiology is following   Alcohol Induced Fatty Liver -Refer to GI team on discharge.   URI (Upper Respiratory Infection) with Coronavirus (Not COVID) -Respiratory panel significant for Coronavirus OC43.   -Blood cultures pending -Procalcitonin is 0.35.  -Patient is still intermittently been febrile and had SIRS criteria; spiked a temperature of 101.5 the night before last and now T Max was 101.7  -Fortunately he has no leukocytosis but WBC is trending upward slowly -Added IV Ceftriaxone and Azithromycin to cover superimposed Bacterial PNA; Will change Azithromycin to Doxycyline to avoid qT prolongation  -LA Trend: Recent Labs  Lab  02/08/23 1649 02/08/23 1855 02/12/23 0931 02/12/23 1223 02/16/23 0935 02/16/23 1200  LATICACIDVEN 1.4 1.1 1.6 1.0 0.9 1.6  -U/A Negative and only showing Bacteuria -WBC Trend: Recent Labs  Lab 02/10/23 0455 02/11/23 0439 02/12/23 0931 02/13/23 0537 02/14/23 0622 02/15/23 0409 02/16/23 0420  WBC 8.2 7.7 7.0 7.7 8.6 10.3 12.3*  -CXR done yesterday today showed "Support apparatus, as above.The appearance of the chest is concerning for multilobar bilateral  pneumonia, most confluent in the left mid to lower lung. Small bilateral pleural effusions. Mild cardiomegaly."' -CXR done today and showed "Cardiac shadow is enlarged but stable. Right PICC is again seen and stable. Improved aeration is noted bilaterally although persistent basilar opacities are seen. No bony abnormality is noted." -Increase Guaifenesin to 1200 mg po BID, add Xopenex q6h, and Atrovent q6hprn -Continue with supportive care and repeat CXR in the AM  Hyperbilirubinemia, stable  -Bilirubin Trend: Recent Labs  Lab 02/10/23 0500 02/11/23 0439 02/12/23 0930 02/13/23 0537 02/14/23 0622 02/15/23 0409 02/16/23 0420  BILITOT 1.3* 1.1 1.7* 1.3* 1.3* 1.3* 1.1  -Continue to Monitor and Trend and Repeat CMP in the AM  Tobacco Abuse -Smoking Cessation counseling given -Discontinue Nicotine 21 mg TD Patch now as patient requesting no longer wanting it  Hypoalbuminemia -Patient's Albumin Trending from 2.1-2.6 -Continue to Monitor and Trend and repeat CMP in the AM  Overweight -Complicates overall prognosis and care -Estimated body mass index is 26.41 kg/m as calculated from the following:   Height as of this encounter: 5\' 10"  (1.778 m).   Weight as of this encounter: 83.5 kg.  -Weight Loss and Dietary Counseling given   DVT prophylaxis:  apixaban (ELIQUIS) tablet 5 mg    Code Status: Full Code Family Communication: No family present at bedside  Disposition Plan:  Level of care: Telemetry Cardiac Status is:  Inpatient Remains inpatient appropriate because: His further clinical improvement in his respiratory status and clearance with cardiology as they are planning for a right and left heart catheter eventually   Consultants:  Cardiology  Procedures:  ECHOCARDIOGRAM IMPRESSIONS     1. Left ventricular ejection fraction, by estimation, is 30 to 35%. The  left ventricle has moderately decreased function. The left ventricle  demonstrates global hypokinesis. Left ventricular diastolic function could  not be evaluated.   2. Right ventricular systolic function is moderately reduced. The right  ventricular size is moderately enlarged. Tricuspid regurgitation signal is  inadequate for assessing PA pressure.   3. Left atrial size was moderately dilated.   4. Right atrial size was moderately dilated.   5. The mitral valve is normal in structure. Mild to moderate mitral valve  regurgitation. No evidence of mitral stenosis.   6. The aortic valve is tricuspid. Aortic valve regurgitation is not  visualized. No aortic stenosis is present.   Comparison(s): Prior images reviewed side by side. The left ventricular  function is  worsened.   FINDINGS   Left Ventricle: Mobile echodensity at the apex appears to be a false  tendon. Left ventricular ejection fraction, by estimation, is 30 to 35%.  The left ventricle has moderately decreased function. The left ventricle  demonstrates global hypokinesis.  Definity contrast agent was given IV to delineate the left ventricular  endocardial borders. The left ventricular internal cavity size was normal  in size. There is borderline concentric left ventricular hypertrophy. Left  ventricular diastolic function  could not be evaluated due to atrial fibrillation. Left ventricular  diastolic function could not be evaluated.   Right Ventricle: The right ventricular size is moderately enlarged. Right  vetricular wall thickness was not well visualized. Right  ventricular  systolic function is moderately reduced. Tricuspid regurgitation signal is  inadequate for assessing PA  pressure.   Left Atrium: Left atrial size was moderately dilated.   Right Atrium: Right atrial size was moderately dilated.   Pericardium: There is no evidence of pericardial effusion.   Mitral Valve: The mitral valve is normal in structure. Mild to moderate  mitral valve regurgitation, with centrally-directed jet. No evidence of  mitral valve stenosis. MV peak gradient, 6.9 mmHg. The mean mitral valve  gradient is 3.7 mmHg.   Tricuspid Valve: The tricuspid valve is grossly normal. Tricuspid valve  regurgitation is not demonstrated.   Aortic Valve: The aortic valve is tricuspid. Aortic valve regurgitation is  not visualized. No aortic stenosis is present. Aortic valve mean gradient  measures 3.0 mmHg. Aortic valve peak gradient measures 4.6 mmHg. Aortic  valve area, by VTI measures 2.04  cm.   Pulmonic Valve: The pulmonic valve was not well visualized. Pulmonic valve  regurgitation is not visualized. No evidence of pulmonic stenosis.   Aorta: The aortic root is normal in size and structure.   IAS/Shunts: The interatrial septum was not well visualized.     LEFT VENTRICLE  PLAX 2D  LVIDd:         4.30 cm     Diastology  LVIDs:         3.60 cm     LV e' medial:    5.51 cm/s  LV PW:         1.10 cm     LV E/e' medial:  23.1  LV IVS:        1.20 cm     LV e' lateral:   9.29 cm/s  LVOT diam:     1.90 cm     LV E/e' lateral: 13.7  LV SV:         29  LV SV Index:   14  LVOT Area:     2.84 cm    LV Volumes (MOD)  LV vol d, MOD A2C: 98.5 ml  LV vol d, MOD A4C: 70.6 ml  LV vol s, MOD A2C: 62.5 ml  LV vol s, MOD A4C: 51.3 ml  LV SV MOD A2C:     36.0 ml  LV SV MOD A4C:     70.6 ml  LV SV MOD BP:      26.5 ml   RIGHT VENTRICLE            IVC  RV Basal diam:  4.50 cm    IVC diam: 2.30 cm  RV S prime:     8.37 cm/s   LEFT ATRIUM              Index         RIGHT  ATRIUM           Index  LA diam:        4.20 cm  2.02 cm/m   RA Area:     22.20 cm  LA Vol (A2C):   117.0 ml 56.29 ml/m  RA Volume:   70.70 ml  34.02 ml/m  LA Vol (A4C):   55.2 ml  26.56 ml/m  LA Biplane Vol: 81.1 ml  39.02 ml/m   AORTIC VALVE  AV Area (Vmax):    1.89 cm  AV Area (Vmean):   1.80 cm  AV Area (VTI):     2.04 cm  AV Vmax:           107.43 cm/s  AV Vmean:          79.233 cm/s  AV VTI:            0.141 m  AV Peak Grad:      4.6 mmHg  AV Mean Grad:      3.0 mmHg  LVOT Vmax:         71.57 cm/s  LVOT Vmean:        50.200 cm/s  LVOT VTI:          0.102 m  LVOT/AV VTI ratio: 0.72    AORTA  Ao Root diam: 3.50 cm   MITRAL VALVE  MV Area (PHT): 5.32 cm     SHUNTS  MV Area VTI:   1.80 cm     Systemic VTI:  0.10 m  MV Peak grad:  6.9 mmHg     Systemic Diam: 1.90 cm  MV Mean grad:  3.7 mmHg  MV Vmax:       1.32 m/s  MV Vmean:      88.5 cm/s  MV Decel Time: 143 msec  MR Peak grad: 81.5 mmHg  MR Vmax:      451.33 cm/s  MV E velocity: 127.33 cm/s    TEE/DCCV COMPLICATIONS:     There were no immediate complications.   FINDINGS:  Moderate reduced LV function Mild MR +PFO Negative study for LAA thrombus  Antimicrobials:  Anti-infectives (From admission, onward)    Start     Dose/Rate Route Frequency Ordered Stop   02/16/23 2200  doxycycline (VIBRA-TABS) tablet 100 mg        100 mg Oral Every 12 hours 02/16/23 1359     02/15/23 1915  cefTRIAXone (ROCEPHIN) 1 g in sodium chloride 0.9 % 100 mL IVPB        1 g 200 mL/hr over 30 Minutes Intravenous Every 24 hours 02/15/23 1828     02/15/23 1915  azithromycin (ZITHROMAX) 500 mg in sodium chloride 0.9 % 250 mL IVPB  Status:  Discontinued        500 mg 250 mL/hr over 60 Minutes Intravenous Every 24 hours 02/15/23 1828 02/16/23 1359       Subjective: Seen and examined at bedside and he is doing okay and thinks he is less swollen.  Continues to cough up a little bit.  Seen in the chair at bedside no  nausea or vomiting.  No other concerns or complaints at this time.  Objective: Vitals:   02/16/23 0814 02/16/23 1141 02/16/23 1509 02/16/23 1613  BP:  94/66  113/69  Pulse: 84 88  (!) 108  Resp: 17 16 17 18   Temp:  98.4 F (36.9 C)  (!) 102.2 F (39 C)  TempSrc:  Oral  Oral  SpO2:  97% 98% 96%  Weight:      Height:        Intake/Output Summary (Last 24 hours) at 02/16/2023 1637 Last data filed at 02/16/2023 1513 Gross per 24 hour  Intake 1784.05 ml  Output 1800 ml  Net -15.95 ml   Filed Weights   02/14/23 0546 02/15/23 0325 02/16/23 0248  Weight: 86.6 kg 83.2 kg 83.5 kg   Examination: Physical Exam:  Constitutional: WN/WD, NAD overweight chronically ill-appearing African-American male sitting in chair at bedside Respiratory: Diminished to auscultation bilaterally some coarse breath sounds and has some slight crackles and rhonchi. No wheezing, rales. Normal respiratory effort and patient is not tachypenic. No accessory muscle use.  Wearing supplemental oxygen via nasal cannula Cardiovascular: RRR, no murmurs / rubs / gallops. S1 and S2 auscultated.  1-2+ lower extremity edema Abdomen: Soft, non-tender, slightly distended secondary to body habitus. Bowel sounds positive.  GU: Deferred. Musculoskeletal: No clubbing / cyanosis of digits/nails. No joint deformity upper and lower extremities. Skin: No rashes, lesions, ulcers on limited skin evaluation. No induration; Warm and dry.  Neurologic: CN 2-12 grossly intact with no focal deficits. Romberg sign and cerebellar reflexes not assessed.  Psychiatric: Normal judgment and insight. Alert and oriented x 3.   Data Reviewed: I have personally reviewed following labs and imaging studies  CBC: Recent Labs  Lab 02/12/23 0931 02/13/23 0537 02/14/23 0622 02/15/23 0409 02/16/23 0420  WBC 7.0 7.7 8.6 10.3 12.3*  NEUTROABS 5.4 5.8 6.3 7.6 8.8*  HGB 8.9* 8.1* 8.3* 8.3* 8.1*  HCT 27.9* 24.8* 25.5* 25.7* 25.4*  MCV 94.9 94.7 93.8  93.5 93.4  PLT 190 218 257 321 409*   Basic Metabolic Panel: Recent Labs  Lab 02/12/23 0930 02/12/23 0931 02/13/23 0537 02/14/23 0622 02/15/23 0409 02/16/23 0420  NA 136  --  137 135 133* 136  K 3.8  --  3.2* 3.8 4.0 3.6  CL 95*  --  94* 91* 83* 85*  CO2 28  --  32 33* 34* 36*  GLUCOSE 115*  --  120* 112* 157* 112*  BUN 24*  --  23 21 19 21   CREATININE 1.46*  --  1.41* 1.30* 1.61* 1.35*  CALCIUM 8.5*  --  8.3* 8.2* 8.7* 8.6*  MG  --  2.0 1.6* 1.7 1.7 2.2  PHOS  --  3.3 3.3 3.0 3.2 4.1   GFR: Estimated Creatinine Clearance: 56.3 mL/min (A) (by C-G formula based on SCr of 1.35 mg/dL (H)). Liver Function Tests: Recent Labs  Lab 02/12/23 0930 02/13/23 0537 02/14/23 0622 02/15/23 0409 02/16/23 0420  AST 25 21 21 21 23   ALT 11 12 12 11 11   ALKPHOS 72 58 57 54 52  BILITOT 1.7* 1.3* 1.3* 1.3* 1.1  PROT 6.3* 5.9* 5.8* 5.8* 5.8*  ALBUMIN 2.5* 2.2* 2.2* 2.2* 2.1*   No results for input(s): "LIPASE", "AMYLASE" in the last 168 hours. No results for input(s): "AMMONIA" in the last 168 hours. Coagulation Profile: No results for input(s): "INR", "PROTIME" in the last 168 hours. Cardiac Enzymes: No results for input(s): "CKTOTAL", "CKMB", "CKMBINDEX", "TROPONINI" in the last 168 hours. BNP (last 3 results) No results for input(s): "PROBNP" in the last 8760 hours. HbA1C: No results for input(s): "HGBA1C" in the last 72 hours. CBG: No results for input(s): "GLUCAP" in the last 168 hours. Lipid Profile: No results for input(s): "CHOL", "HDL", "LDLCALC", "TRIG", "CHOLHDL", "LDLDIRECT" in the last 72 hours. Thyroid Function Tests: No results for input(s): "TSH", "T4TOTAL", "FREET4", "T3FREE", "THYROIDAB" in the last 72  hours. Anemia Panel: No results for input(s): "VITAMINB12", "FOLATE", "FERRITIN", "TIBC", "IRON", "RETICCTPCT" in the last 72 hours. Sepsis Labs: Recent Labs  Lab 02/12/23 0931 02/12/23 1223 02/16/23 0935 02/16/23 1200  LATICACIDVEN 1.6 1.0 0.9 1.6   Recent  Results (from the past 240 hours)  Resp panel by RT-PCR (RSV, Flu A&B, Covid) Anterior Nasal Swab     Status: None   Collection Time: 02/08/23  4:39 PM   Specimen: Anterior Nasal Swab  Result Value Ref Range Status   SARS Coronavirus 2 by RT PCR NEGATIVE NEGATIVE Final   Influenza A by PCR NEGATIVE NEGATIVE Final   Influenza B by PCR NEGATIVE NEGATIVE Final    Comment: (NOTE) The Xpert Xpress SARS-CoV-2/FLU/RSV plus assay is intended as an aid in the diagnosis of influenza from Nasopharyngeal swab specimens and should not be used as a sole basis for treatment. Nasal washings and aspirates are unacceptable for Xpert Xpress SARS-CoV-2/FLU/RSV testing.  Fact Sheet for Patients: BloggerCourse.com  Fact Sheet for Healthcare Providers: SeriousBroker.it  This test is not yet approved or cleared by the [place] FDA and has been authorized for detection and/or diagnosis of SARS-CoV-2 by FDA under an Emergency Use Authorization (EUA). This EUA will remain in effect (meaning this test can be used) for the duration of the COVID-19 declaration under Section 564(b)(1) of the Act, 21 U.S.C. section 360bbb-3(b)(1), unless the authorization is terminated or revoked.     Resp Syncytial Virus by PCR NEGATIVE NEGATIVE Final    Comment: (NOTE) Fact Sheet for Patients: BloggerCourse.com  Fact Sheet for Healthcare Providers: SeriousBroker.it  This test is not yet approved or cleared by the [place] FDA and has been authorized for detection and/or diagnosis of SARS-CoV-2 by FDA under an Emergency Use Authorization (EUA). This EUA will remain in effect (meaning this test can be used) for the duration of the COVID-19 declaration under Section 564(b)(1) of the Act, 21 U.S.C. section 360bbb-3(b)(1), unless the authorization is terminated or revoked.  Performed at [facility], [address], [place], [place] [address]   Culture, blood (routine x 2)     Status: None   Collection Time: 02/08/23  4:39 PM   Specimen: BLOOD LEFT HAND  Result Value Ref Range Status   Specimen Description BLOOD LEFT HAND  Final   Special Requests   Final    BOTTLES DRAWN AEROBIC AND ANAEROBIC Blood Culture results may not be optimal due to an inadequate volume of blood received in culture bottles   Culture   Final    NO GROWTH 5 DAYS Performed at [facility], [address], [place], [place] [address]    Report Status 02/13/2023 FINAL  Final  Culture, blood (routine x 2)     Status: None   Collection Time: 02/08/23  4:39 PM   Specimen: BLOOD LEFT FOREARM  Result Value Ref Range Status   Specimen Description BLOOD LEFT FOREARM  Final   Special Requests   Final    BOTTLES DRAWN AEROBIC AND ANAEROBIC Blood Culture results may not be optimal due to an inadequate volume of blood received in culture bottles   Culture   Final    NO GROWTH 5 DAYS Performed at [facility], [address], [place], [place] [address]    Report Status 02/13/2023 FINAL  Final  Respiratory (~20 pathogens) panel by PCR     Status: Abnormal   Collection Time: 02/08/23  4:39 PM   Specimen: Nasopharyngeal Swab;  Respiratory  Result Value Ref Range Status   Adenovirus NOT DETECTED NOT DETECTED Final   Coronavirus 229E NOT DETECTED NOT DETECTED Final    Comment: (NOTE) The Coronavirus on the Respiratory Panel, DOES NOT test for the novel  Coronavirus (2019 nCoV)    Coronavirus HKU1 NOT DETECTED NOT DETECTED Final   Coronavirus NL63 NOT DETECTED NOT DETECTED Final   Coronavirus OC43 DETECTED (A) NOT DETECTED Final   Metapneumovirus NOT DETECTED NOT DETECTED Final   Rhinovirus / Enterovirus NOT DETECTED NOT DETECTED Final   Influenza A NOT DETECTED NOT DETECTED Final   Influenza B NOT DETECTED NOT DETECTED Final   Parainfluenza Virus 1 NOT DETECTED NOT DETECTED Final   Parainfluenza Virus 2 NOT  DETECTED NOT DETECTED Final   Parainfluenza Virus 3 NOT DETECTED NOT DETECTED Final   Parainfluenza Virus 4 NOT DETECTED NOT DETECTED Final   Respiratory Syncytial Virus NOT DETECTED NOT DETECTED Final   Bordetella pertussis NOT DETECTED NOT DETECTED Final   Bordetella Parapertussis NOT DETECTED NOT DETECTED Final   Chlamydophila pneumoniae NOT DETECTED NOT DETECTED Final   Mycoplasma pneumoniae NOT DETECTED NOT DETECTED Final    Comment: Performed at [facility], [address], [place], [place] [address]    Radiology Studies: DG CHEST PORT 1 VIEW Result Date: 02/16/2023 CLINICAL DATA:  Shortness of breath EXAM: PORTABLE CHEST 1 VIEW COMPARISON:  02/15/2023 FINDINGS: Cardiac shadow is enlarged but stable. Right PICC is again seen and stable. Improved aeration is noted bilaterally although persistent basilar opacities are seen. No bony abnormality is noted. IMPRESSION: Persistent but improved basilar opacities when compared with the previous day. Electronically Signed   By: Alcide Clever M.D.   On: 02/16/2023 10:03   DG CHEST PORT 1 VIEW Result Date: 02/15/2023 CLINICAL DATA:  63 year old male with history of shortness of breath and tachycardia. EXAM: PORTABLE CHEST 1 VIEW COMPARISON:  Chest x-ray 02/11/2023. FINDINGS: There is a right upper extremity PICC with tip terminating in the superior cavoatrial junction. Lung volumes are very low. Patchy multifocal interstitial prominence an ill-defined airspace disease in the lungs bilaterally, most confluent in the left mid to lower lung. Small bilateral pleural effusions. No pneumothorax. Pulmonary vasculature does not appear engorged. Heart size appears mildly enlarged. The patient is rotated to the right on today's exam, resulting in distortion of the mediastinal contours and reduced diagnostic sensitivity and specificity for mediastinal pathology. IMPRESSION: 1. Support apparatus, as above. 2. The appearance of the chest is concerning for  multilobar bilateral pneumonia, most confluent in the left mid to lower lung. 3. Small bilateral pleural effusions. 4. Mild cardiomegaly. Electronically Signed   By: Trudie Reed M.D.   On: 02/15/2023 07:15   Scheduled Meds:  apixaban  5 mg Oral BID   doxycycline  100 mg Oral Q12H   feeding supplement  237 mL Oral BID BM   guaiFENesin  1,200 mg Oral BID   levalbuterol  0.63 mg Nebulization Q6H   midodrine  15 mg Oral TID WC   pantoprazole  40 mg Oral BID   sodium chloride flush  10-40 mL Intracatheter Q12H   sodium chloride flush  3 mL Intravenous Q12H   thiamine  100 mg Oral Daily   Continuous Infusions:  amiodarone 30 mg/hr (02/16/23 1513)   cefTRIAXone (ROCEPHIN)  IV Stopped (02/15/23 1934)   milrinone 0.25 mcg/kg/min (02/16/23 1504)    LOS: 7 days   Marguerita Merles, DO [facility] Available via Epic secure chat 7am-7pm After  these hours, please refer to coverage provider listed on [email or web address] 02/16/2023, 4:37 PM

## 2023-02-17 DIAGNOSIS — I4891 Unspecified atrial fibrillation: Secondary | ICD-10-CM | POA: Diagnosis not present

## 2023-02-17 LAB — CBC
HCT: 24.5 % — ABNORMAL LOW (ref 39.0–52.0)
Hemoglobin: 7.7 g/dL — ABNORMAL LOW (ref 13.0–17.0)
MCH: 29.5 pg (ref 26.0–34.0)
MCHC: 31.4 g/dL (ref 30.0–36.0)
MCV: 93.9 fL (ref 80.0–100.0)
Platelets: 442 10*3/uL — ABNORMAL HIGH (ref 150–400)
RBC: 2.61 MIL/uL — ABNORMAL LOW (ref 4.22–5.81)
RDW: 18.4 % — ABNORMAL HIGH (ref 11.5–15.5)
WBC: 11.1 10*3/uL — ABNORMAL HIGH (ref 4.0–10.5)
nRBC: 0 % (ref 0.0–0.2)

## 2023-02-17 LAB — COMPREHENSIVE METABOLIC PANEL
ALT: 10 U/L (ref 0–44)
AST: 23 U/L (ref 15–41)
Albumin: 2.1 g/dL — ABNORMAL LOW (ref 3.5–5.0)
Alkaline Phosphatase: 52 U/L (ref 38–126)
Anion gap: 16 — ABNORMAL HIGH (ref 5–15)
BUN: 18 mg/dL (ref 8–23)
CO2: 33 mmol/L — ABNORMAL HIGH (ref 22–32)
Calcium: 8.6 mg/dL — ABNORMAL LOW (ref 8.9–10.3)
Chloride: 80 mmol/L — ABNORMAL LOW (ref 98–111)
Creatinine, Ser: 1.15 mg/dL (ref 0.61–1.24)
GFR, Estimated: >60 mL/min (ref >60)
Glucose, Bld: 269 mg/dL — ABNORMAL HIGH (ref 70–99)
Potassium: 4.1 mmol/L (ref 3.5–5.1)
Sodium: 129 mmol/L — ABNORMAL LOW (ref 135–145)
Total Bilirubin: 1 mg/dL (ref 0.0–1.2)
Total Protein: 6.1 g/dL — ABNORMAL LOW (ref 6.5–8.1)

## 2023-02-17 LAB — MRSA NEXT GEN BY PCR, NASAL: MRSA by PCR Next Gen: NOT DETECTED

## 2023-02-17 LAB — COOXEMETRY PANEL
Carboxyhemoglobin: 3.1 % — ABNORMAL HIGH (ref 0.5–1.5)
Methemoglobin: <0.7 % (ref 0.0–1.5)
O2 Saturation: 92.4 %
Total hemoglobin: 8.2 g/dL — ABNORMAL LOW (ref 12.0–16.0)

## 2023-02-17 LAB — PROCALCITONIN: Procalcitonin: 0.48 ng/mL

## 2023-02-17 LAB — MAGNESIUM: Magnesium: 2 mg/dL (ref 1.7–2.4)

## 2023-02-17 MED ORDER — AMIODARONE HCL 200 MG PO TABS: 200.0000 mg | ORAL_TABLET | Freq: Every day | ORAL | Status: DC

## 2023-02-17 MED ORDER — DICLOFENAC SODIUM 1 % EX GEL
2.0000 g | Freq: Four times a day (QID) | CUTANEOUS | Status: DC
  Administered 2023-02-17 – 2023-02-21 (×11): 2 g via TOPICAL
  Filled 2023-02-17: qty 100

## 2023-02-17 MED ORDER — FUROSEMIDE 40 MG PO TABS
40.0000 mg | ORAL_TABLET | Freq: Two times a day (BID) | ORAL | Status: DC
  Administered 2023-02-17 – 2023-02-19 (×5): 40 mg via ORAL
  Filled 2023-02-17 (×5): qty 1

## 2023-02-17 MED ORDER — LEVALBUTEROL HCL 0.63 MG/3ML IN NEBU: 0.6300 mg | INHALATION_SOLUTION | Freq: Four times a day (QID) | RESPIRATORY_TRACT | Status: DC | PRN

## 2023-02-17 MED ORDER — AMIODARONE HCL 200 MG PO TABS
200.0000 mg | ORAL_TABLET | Freq: Two times a day (BID) | ORAL | Status: DC
  Administered 2023-02-17 – 2023-02-22 (×11): 200 mg via ORAL
  Filled 2023-02-17 (×10): qty 1

## 2023-02-17 NOTE — Plan of Care (Signed)
  Problem: Education: Goal: Knowledge of General Education information will improve Description: Including pain rating scale, medication(s)/side effects and non-pharmacologic comfort measures Outcome: Progressing   Problem: Health Behavior/Discharge Planning: Goal: Ability to manage health-related needs will improve Outcome: Progressing   Problem: Clinical Measurements: Goal: Ability to maintain clinical measurements within normal limits will improve Outcome: Progressing Goal: Will remain free from infection Outcome: Progressing Goal: Diagnostic test results will improve Outcome: Progressing Goal: Respiratory complications will improve Outcome: Progressing Goal: Cardiovascular complication will be avoided Outcome: Progressing   Problem: Activity: Goal: Risk for activity intolerance will decrease Outcome: Progressing   Problem: Nutrition: Goal: Adequate nutrition will be maintained Outcome: Progressing   Problem: Coping: Goal: Level of anxiety will decrease Outcome: Progressing   Problem: Elimination: Goal: Will not experience complications related to bowel motility Outcome: Progressing Goal: Will not experience complications related to urinary retention Outcome: Progressing   Problem: Pain Managment: Goal: General experience of comfort will improve and/or be controlled Outcome: Progressing   Problem: Safety: Goal: Ability to remain free from injury will improve Outcome: Progressing   Problem: Skin Integrity: Goal: Risk for impaired skin integrity will decrease Outcome: Progressing   Problem: Education: Goal: Ability to demonstrate management of disease process will improve Outcome: Progressing Goal: Ability to verbalize understanding of medication therapies will improve Outcome: Progressing Goal: Individualized Educational Video(s) Outcome: Progressing   Problem: Activity: Goal: Capacity to carry out activities will improve Outcome: Progressing    Problem: Cardiac: Goal: Ability to achieve and maintain adequate cardiopulmonary perfusion will improve Outcome: Progressing   Problem: Education: Goal: Knowledge of disease or condition will improve Outcome: Progressing Goal: Understanding of medication regimen will improve Outcome: Progressing Goal: Individualized Educational Video(s) Outcome: Progressing   Problem: Activity: Goal: Ability to tolerate increased activity will improve Outcome: Progressing   Problem: Cardiac: Goal: Ability to achieve and maintain adequate cardiopulmonary perfusion will improve Outcome: Progressing   Problem: Health Behavior/Discharge Planning: Goal: Ability to safely manage health-related needs after discharge will improve Outcome: Progressing

## 2023-02-17 NOTE — Progress Notes (Signed)
 PROGRESS NOTE    Justin Fleming  UUV:[id number] DOB: 10/31/1960 DOA: 02/08/2023 PCP: [name], FNP   Brief Narrative:  The patient is a 63 year old AAM with a past medical history significant for atrial fibrillation, CHF, gout, GERD, history of alcohol abuse and other comorbidities who continues to drink significant alcohol.  Is admitted with A-fib with RVR and acute on chronic systolic congestive heart failure. He also was noted to have worsening renal function.  Details of hospitalization as below.  Assessment & Plan:   Principal Problem:   Atrial fibrillation with RVR (HCC) Active Problems:   Alcohol abuse   Elevated troponin   Atrial fibrillation with rapid ventricular response (HCC)   Chronic anemia   Acute on chronic diastolic CHF (congestive heart failure) (HCC)   Alcohol induced fatty liver   URI (upper respiratory infection)  Atrial Fibrillation with RVR (HCC) s/p TEE/DCCV and conversion to NSR -TSH was 2.743. Patient was on amiodarone drip and transitioned to oral amiodarone as below however it has been changed patient back to amiodarone drip given that he was started on milrinone.  Patient not on beta-blocker due to hypotension. 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 -Status post TEE/DCCV 02/11/23, Findings on TEE showed "Moderate reduced LV function, Mild MR,+PFO,Negative study for LAA thrombus".  Management per cardiology.   Acute respiratory failure with hypoxia secondary to acute on Chronic Systolic CHF/Cardiomyopathy: -Echocardiogram done and showed an LVEF of 30 to 35% with global hypokinesis, moderately reduced right ventricular function, moderate biatrial enlargement and mild to moderate MR; this EF is down from 50 to 55% back in December Patient was diuresed with IV Lasix, started on midodrine to support blood pressures and feel that his GDMT is limited otherwise.  Patient is net negative almost 10 L since admission.  He has  been transitioned to oral Lasix 02/17/2023.  Cardiology managing.  Patient remains on milrinone drip.  Patient currently requiring 2 L of oxygen.  Saturating 97%.   Alcohol Abuse, Continuous -Counseled to quit alcohol.  No signs of withdrawal this admission.   Chronic Normocytic Anemia, stable  Baseline hemoglobin appears to be between 8-10.  Hemoglobin gradually dropping and today it is 7.7.  Will continue to monitor and target to transfuse if less than 7.5 due to acute CHF and hypoxia.  Hypokalemia Resolved   Hypomagnesemia Resolved   AKI vs CKD stage IIIa ruled out.  Just clarifying that previous to this hospitalization, patient's baseline creatinine appears to be between 1.07-1.23.  However during this hospitalization patient's creatinine peaked at 1.49, that was on day of admission.  Patient does not carry any history of CKD based on previous creatinine.  Creatinine has now improved and back to 1.15 which is normal for him.  Based on this, patient had AKI only which has now resolved.  Hyponatremia: Fluctuating, 129 today.  Monitor daily.   Elevated Troponin -In the setting of tachycardia, demand ischemia. -Troponin went from 68 -> 67 -Continue to monitor  -Cardiology is following   Alcohol Induced Fatty Liver -Refer to GI team on discharge.   URI (Upper Respiratory Infection) with Coronavirus OC43 (Not COVID)/community-acquired pneumonia -Respiratory panel significant for Coronavirus OC43.  Blood culture negative.  Chest x-ray from 02/16/2023 shows left middle and lower lobe infiltrates, consistent with pneumonia.  Patient has been started on Rocephin and doxycycline which I will continue for total of 5 days.  Patient did spike fever of 102.2 at 4 PM on 02/16/2023.  Tobacco Abuse I have  discussed tobacco cessation with the patient.  I have counseled the patient regarding the negative impacts of continued tobacco use including but not limited to lung cancer, COPD, and cardiovascular  disease.  I have discussed alternatives to tobacco and modalities that may help facilitate tobacco cessation including but not limited to biofeedback, hypnosis, and medications.  Total time spent with tobacco counseling was 5 minutes.  DVT prophylaxis: Eliquis   Code Status: Full Code  Family Communication:  None present at bedside.  Plan of care discussed with patient in length and he/she verbalized understanding and agreed with it.  Status is: Inpatient Remains inpatient appropriate because: Still on Amio drip and milrinone drip   Estimated body mass index is 26.53 kg/m as calculated from the following:   Height as of this encounter: 5\' 10"  (1.778 m).   Weight as of this encounter: 83.9 kg.    Nutritional Assessment: Body mass index is 26.53 kg/m.Marland Kitchen Seen by dietician.  I agree with the assessment and plan as outlined below: Nutrition Status:        . Skin Assessment: I have examined the patient's skin and I agree with the wound assessment as performed by the wound care RN as outlined below:    Consultants:  Cardiology  Procedures:  As above  Antimicrobials:  Anti-infectives (From admission, onward)    Start     Dose/Rate Route Frequency Ordered Stop   02/16/23 2200  doxycycline (VIBRA-TABS) tablet 100 mg        100 mg Oral Every 12 hours 02/16/23 1359     02/15/23 1915  cefTRIAXone (ROCEPHIN) 1 g in sodium chloride 0.9 % 100 mL IVPB        1 g 200 mL/hr over 30 Minutes Intravenous Every 24 hours 02/15/23 1828     02/15/23 1915  azithromycin (ZITHROMAX) 500 mg in sodium chloride 0.9 % 250 mL IVPB  Status:  Discontinued        500 mg 250 mL/hr over 60 Minutes Intravenous Every 24 hours 02/15/23 1828 02/16/23 1359         Subjective: Patient seen and examined.  He has no complaints at all.  Objective: Vitals:   02/17/23 0319 02/17/23 0335 02/17/23 0353 02/17/23 0749  BP: 98/63   90/62  Pulse: 83 83 81   Resp: 15   18  Temp: 98.2 F (36.8 C)   98.2 F (36.8  C)  TempSrc: Oral   Oral  SpO2: 97% 95% 97%   Weight: 83.9 kg     Height:        Intake/Output Summary (Last 24 hours) at 02/17/2023 1135 Last data filed at 02/17/2023 1125 Gross per 24 hour  Intake 1267.49 ml  Output 1400 ml  Net -132.51 ml   Filed Weights   02/15/23 0325 02/16/23 0248 02/17/23 0319  Weight: 83.2 kg 83.5 kg 83.9 kg    Examination:  General exam: Appears calm and comfortable  Respiratory system: Rhonchi left middle and lower lobe.  Respiratory effort normal. Cardiovascular system: S1 & S2 heard, RRR. No JVD, murmurs, rubs, gallops or clicks.  +1 pitting edema bilateral lower extremity. Gastrointestinal system: Abdomen is nondistended, soft and nontender. No organomegaly or masses felt. Normal bowel sounds heard. Central nervous system: Alert and oriented. No focal neurological deficits. Extremities: Symmetric 5 x 5 power. Skin: No rashes, lesions or ulcers Psychiatry: Judgement and insight appear normal. Mood & affect appropriate.    Data Reviewed: I have personally reviewed following labs and imaging studies  CBC: Recent Labs  Lab 02/12/23 0931 02/13/23 0537 02/14/23 0622 02/15/23 0409 02/16/23 0420 02/17/23 0528  WBC 7.0 7.7 8.6 10.3 12.3* 11.1*  NEUTROABS 5.4 5.8 6.3 7.6 8.8*  --   HGB 8.9* 8.1* 8.3* 8.3* 8.1* 7.7*  HCT 27.9* 24.8* 25.5* 25.7* 25.4* 24.5*  MCV 94.9 94.7 93.8 93.5 93.4 93.9  PLT 190 218 257 321 409* 442*   Basic Metabolic Panel: Recent Labs  Lab 02/12/23 0931 02/13/23 0537 02/14/23 0622 02/15/23 0409 02/16/23 0420 02/17/23 0528  NA  --  137 135 133* 136 129*  K  --  3.2* 3.8 4.0 3.6 4.1  CL  --  94* 91* 83* 85* 80*  CO2  --  32 33* 34* 36* 33*  GLUCOSE  --  120* 112* 157* 112* 269*  BUN  --  23 21 19 21 18   CREATININE  --  1.41* 1.30* 1.61* 1.35* 1.15  CALCIUM  --  8.3* 8.2* 8.7* 8.6* 8.6*  MG 2.0 1.6* 1.7 1.7 2.2 2.0  PHOS 3.3 3.3 3.0 3.2 4.1  --    GFR: Estimated Creatinine Clearance: 66.1 mL/min (by C-G  formula based on SCr of 1.15 mg/dL). Liver Function Tests: Recent Labs  Lab 02/13/23 0537 02/14/23 0622 02/15/23 0409 02/16/23 0420 02/17/23 0528  AST 21 21 21 23 23   ALT 12 12 11 11 10   ALKPHOS 58 57 54 52 52  BILITOT 1.3* 1.3* 1.3* 1.1 1.0  PROT 5.9* 5.8* 5.8* 5.8* 6.1*  ALBUMIN 2.2* 2.2* 2.2* 2.1* 2.1*   No results for input(s): "LIPASE", "AMYLASE" in the last 168 hours. No results for input(s): "AMMONIA" in the last 168 hours. Coagulation Profile: No results for input(s): "INR", "PROTIME" in the last 168 hours. Cardiac Enzymes: No results for input(s): "CKTOTAL", "CKMB", "CKMBINDEX", "TROPONINI" in the last 168 hours. BNP (last 3 results) No results for input(s): "PROBNP" in the last 8760 hours. HbA1C: No results for input(s): "HGBA1C" in the last 72 hours. CBG: No results for input(s): "GLUCAP" in the last 168 hours. Lipid Profile: No results for input(s): "CHOL", "HDL", "LDLCALC", "TRIG", "CHOLHDL", "LDLDIRECT" in the last 72 hours. Thyroid Function Tests: No results for input(s): "TSH", "T4TOTAL", "FREET4", "T3FREE", "THYROIDAB" in the last 72 hours. Anemia Panel: No results for input(s): "VITAMINB12", "FOLATE", "FERRITIN", "TIBC", "IRON", "RETICCTPCT" in the last 72 hours. Sepsis Labs: Recent Labs  Lab 02/12/23 0931 02/12/23 1223 02/16/23 0935 02/16/23 1200 02/17/23 0528  PROCALCITON  --   --   --   --  0.48  LATICACIDVEN 1.6 1.0 0.9 1.6  --     Recent Results (from the past 240 hours)  Resp panel by RT-PCR (RSV, Flu A&B, Covid) Anterior Nasal Swab     Status: None   Collection Time: 02/08/23  4:39 PM   Specimen: Anterior Nasal Swab  Result Value Ref Range Status   SARS Coronavirus 2 by RT PCR NEGATIVE NEGATIVE Final   Influenza A by PCR NEGATIVE NEGATIVE Final   Influenza B by PCR NEGATIVE NEGATIVE Final    Comment: (NOTE) The Xpert Xpress SARS-CoV-2/FLU/RSV plus assay is intended as an aid in the diagnosis of influenza from Nasopharyngeal swab  specimens and should not be used as a sole basis for treatment. Nasal washings and aspirates are unacceptable for Xpert Xpress SARS-CoV-2/FLU/RSV testing.  Fact Sheet for Patients: BloggerCourse.com  Fact Sheet for Healthcare Providers: SeriousBroker.it  This test is not yet approved or cleared by the [place] FDA and has been authorized for detection and/or  diagnosis of SARS-CoV-2 by FDA under an Emergency Use Authorization (EUA). This EUA will remain in effect (meaning this test can be used) for the duration of the COVID-19 declaration under Section 564(b)(1) of the Act, 21 U.S.C. section 360bbb-3(b)(1), unless the authorization is terminated or revoked.     Resp Syncytial Virus by PCR NEGATIVE NEGATIVE Final    Comment: (NOTE) Fact Sheet for Patients: BloggerCourse.com  Fact Sheet for Healthcare Providers: SeriousBroker.it  This test is not yet approved or cleared by the [place] FDA and has been authorized for detection and/or diagnosis of SARS-CoV-2 by FDA under an Emergency Use Authorization (EUA). This EUA will remain in effect (meaning this test can be used) for the duration of the COVID-19 declaration under Section 564(b)(1) of the Act, 21 U.S.C. section 360bbb-3(b)(1), unless the authorization is terminated or revoked.  Performed at [facility], [address] [place], [place] [address]   Culture, blood (routine x 2)     Status: None   Collection Time: 02/08/23  4:39 PM   Specimen: BLOOD LEFT HAND  Result Value Ref Range Status   Specimen Description BLOOD LEFT HAND  Final   Special Requests   Final    BOTTLES DRAWN AEROBIC AND ANAEROBIC Blood Culture results may not be optimal due to an inadequate volume of blood received in culture bottles   Culture   Final    NO GROWTH 5 DAYS Performed at [facility], [address], [place],  [place] [address]    Report Status 02/13/2023 FINAL  Final  Culture, blood (routine x 2)     Status: None   Collection Time: 02/08/23  4:39 PM   Specimen: BLOOD LEFT FOREARM  Result Value Ref Range Status   Specimen Description BLOOD LEFT FOREARM  Final   Special Requests   Final    BOTTLES DRAWN AEROBIC AND ANAEROBIC Blood Culture results may not be optimal due to an inadequate volume of blood received in culture bottles   Culture   Final    NO GROWTH 5 DAYS Performed at [facility], [address] [place], [place] [address]    Report Status 02/13/2023 FINAL  Final  Respiratory (~20 pathogens) panel by PCR     Status: Abnormal   Collection Time: 02/08/23  4:39 PM   Specimen: Nasopharyngeal Swab; Respiratory  Result Value Ref Range Status   Adenovirus NOT DETECTED NOT DETECTED Final   Coronavirus 229E NOT DETECTED NOT DETECTED Final    Comment: (NOTE) The Coronavirus on the Respiratory Panel, DOES NOT test for the novel  Coronavirus (2019 nCoV)    Coronavirus HKU1 NOT DETECTED NOT DETECTED Final   Coronavirus NL63 NOT DETECTED NOT DETECTED Final   Coronavirus OC43 DETECTED (A) NOT DETECTED Final   Metapneumovirus NOT DETECTED NOT DETECTED Final   Rhinovirus / Enterovirus NOT DETECTED NOT DETECTED Final   Influenza A NOT DETECTED NOT DETECTED Final   Influenza B NOT DETECTED NOT DETECTED Final   Parainfluenza Virus 1 NOT DETECTED NOT DETECTED Final   Parainfluenza Virus 2 NOT DETECTED NOT DETECTED Final   Parainfluenza Virus 3 NOT DETECTED NOT DETECTED Final   Parainfluenza Virus 4 NOT DETECTED NOT DETECTED Final   Respiratory Syncytial Virus NOT DETECTED NOT DETECTED Final   Bordetella pertussis NOT DETECTED NOT DETECTED Final   Bordetella Parapertussis NOT DETECTED NOT DETECTED Final   Chlamydophila pneumoniae NOT DETECTED NOT DETECTED Final   Mycoplasma pneumoniae NOT DETECTED NOT DETECTED Final  Comment: Performed at [facility], [address], [place],  [place] [address]     Radiology Studies: DG CHEST PORT 1 VIEW Result Date: 02/16/2023 CLINICAL DATA:  Shortness of breath EXAM: PORTABLE CHEST 1 VIEW COMPARISON:  02/15/2023 FINDINGS: Cardiac shadow is enlarged but stable. Right PICC is again seen and stable. Improved aeration is noted bilaterally although persistent basilar opacities are seen. No bony abnormality is noted. IMPRESSION: Persistent but improved basilar opacities when compared with the previous day. Electronically Signed   By: Alcide Clever M.D.   On: 02/16/2023 10:03    Scheduled Meds:  apixaban  5 mg Oral BID   diclofenac Sodium  2 g Topical QID   doxycycline  100 mg Oral Q12H   feeding supplement  237 mL Oral BID BM   furosemide  40 mg Oral BID   guaiFENesin  1,200 mg Oral BID   midodrine  15 mg Oral TID WC   pantoprazole  40 mg Oral BID   sodium chloride flush  10-40 mL Intracatheter Q12H   sodium chloride flush  3 mL Intravenous Q12H   thiamine  100 mg Oral Daily   Continuous Infusions:  amiodarone 30 mg/hr (02/17/23 0548)   cefTRIAXone (ROCEPHIN)  IV 1 g (02/16/23 1834)   milrinone 0.25 mcg/kg/min (02/17/23 0545)     LOS: 8 days   Hughie Closs, MD [facility]  02/17/2023, 11:35 AM   *Please note that this is a verbal dictation therefore any spelling or grammatical errors are due to the "Dragon Medical One" system interpretation.  Please page via Amion and do not message via secure chat for urgent patient care matters. Secure chat can be used for non urgent patient care matters.  How to contact the [facility] Attending or Consulting provider 7A - 7P or covering provider during after hours 7P -7A, for this patient?  Check the care team in [facility] and look for a) attending/consulting [facility] provider listed and b) the [facility] team listed. Page or secure chat 7A-7P. Log into [email or web address] and use [place]'s universal password to access. If you do not have the password, please contact the hospital operator. Locate the [facility] provider  you are looking for under [facility] and page to a number that you can be directly reached. If you still have difficulty reaching the provider, please page the [facility] (Director on Call) for the Hospitalists listed on amion for assistance.

## 2023-02-17 NOTE — Progress Notes (Signed)
Heart Failure Stewardship Pharmacist Progress Note   PCP: [name], FNP PCP-Cardiologist: Nona Dell, MD    HPI:  Justin Fleming is 45 YOM with PMH CHF, AF, HTN, gout, alcohol abuse, and depression  Patient recently had been admitted to a detox facility prior to visiting his PCP to establish care. At the visit with PCP on 2/3, patient demonstrated DOE, tachycardic, and was febrile. EKG obtained showed AF RVR prompting referral to ED. Upon arrival, pt states he had been experiencing SOB for 1-2 weeks with chills and weakness. He denied chest pain or palpitations. He was alert and oriented with no reported changes in appetite. Physical examination showed 3-4+ bilateral LEE to mid thigh, JVD to earlobe, and reduced breathing sounds observed. Labs obtained BNP 570.9, flat troponin, lactate 1.4, Scr 1.49, BUN 15, Na 142, K 3.7. CXR showed bilateral atelectasis in lung bases, cardiac enlargement, small R pleural effusion. He was given lasix 40 mg IV x1 and started on amiodarone IV gtt. ECHO on 2/4 LVEF 30-35% (prev 55-60%,12/2022), LV global hypokinesis, moderately reduced RV function, moderate bilateral enlargement, mild-moderate MV regurgitation.  Patient underwent successful TEE/DCCV on 2/6, and was converted to NSR with HR 70s. Post-procedure with poor urinary output, AMS, hypotensive, and slightly worse renal function. Concerned for low output. Lactic acid 1.6. PICC placed and initial coox was 54%. Started on milrinone with improvements in coox to 73%.  Today, patient reports he is feeling ok. States he still has pain due to tightness in his legs (knees and ankles) from the swelling. He still has bilateral 1+ pitting LEE. Coox 92% on milrinone 0.25 mcg/kg/min. Breathing is ok. Patient still unable to lay flat yet. Remains on 3L oxygen. BP low 90/70s and HR now 90s on midodrine 15 mg TID.  Denies palpitations or chest pain. Cardiology plans to hold off on [facility] with persistent fevers and  concerns for possible PNA.  Current HF Medications: Diuretic: furosemide 40 mg PO BID Other: milrinone 0.25 mcg/kg/min; midodrine 15 mg TID  Prior to admission HF Medications: Diuretic: furosemide 40 mg oral daily  Pertinent Lab Values: Serum creatinine 1.15, BUN 18, Potassium 4.1, Sodium 129, Magnesium 2.0 BNP 570.9 >964.6 pg/mL  Vital Signs: Weight: 184 lbs (admission weight: 198 lbs) Blood pressure: 90-100/60-70 mmHg Heart rate: 80-100 bpm  I/O: net -0.2 L yesterday; net -9.5 L since admission  Medication Assistance / Insurance Benefits Check: Does the patient have prescription insurance?  Yes Type of insurance plan: Medicare Advantage   Does the patient qualify for medication assistance through manufacturers or grants?   Pending  Outpatient Pharmacy:  Prior to admission outpatient [facility] [place], [place] on [address] Is the patient willing to use [facility] TOC pharmacy at discharge? Pending Is the patient willing to transition their outpatient pharmacy to utilize a [facility] outpatient pharmacy?   Pending    Assessment: 1. 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 NYHA class 2-3 symptoms. - Patient is hypervolemic upon exam with 1+ pitting LEE and requiring 3L supplemental oxygen. Strict I/Os and daily weights. R/LHC being postponed with concerns for PNA given fever spike last 24hr.  - Continue diuresis with furosemide 40 mg PO BID - Continue milrinone 0.25 mcg/kg/min - coox 92% today.  - Hold BB while on milrinone - Hold RAASi, MRA, and SGLT2i with low BP requiring midodrine and poor renal function.   Plan: 1) Medication changes recommended at this time: -  Agree with reduction of furosemide to 40 mg PO BID today; will likely need to increase furosemide and switch PO to IV tomorrow  2) Patient assistance: - Patient has Medicare Advantage plan - Plan to discuss prescription access with patient prior  to discharge  3)  Education  - To be completed prior to discharge  Wilmer Floor, PharmD PGY2 Cardiology Pharmacy Resident Heart Failure Stewardship Pharmacist Phone [phone]

## 2023-02-17 NOTE — Progress Notes (Signed)
Mobility Specialist Progress Note;   02/17/23 1125  Mobility  Activity Transferred from bed to chair  Level of Assistance +2 (takes two people) (ModA)  Location manager Ambulated (ft) 5 ft  Activity Response Tolerated well  Mobility Referral Yes  Mobility visit 1 Mobility  Mobility Specialist Start Time (ACUTE ONLY) 1125  Mobility Specialist Stop Time (ACUTE ONLY) 1133  Mobility Specialist Time Calculation (min) (ACUTE ONLY) 8 min   Pt agreeable to mobility. Required MinA for bed mobility and ModA +2 to stand from elevated bed and safely transfer pt to chair. VSS throughout and no c/o during session. Verbal cues required for foot progression and RW proximity. Pt left comfortably in chair with all needs met, alarm on. RN in room.   Caesar Bookman Mobility Specialist Please contact via SecureChat or [employer] [phone]

## 2023-02-17 NOTE — Progress Notes (Signed)
Patient Name: Justin Fleming Date of Encounter: 02/17/2023 [facility] Cardiologist: Nona Dell, MD   Interval Summary  .    Patient resting comfortably in the bed He states he feels as if he is holding on to fluid as compared to yesterday He says his breathing has stayed about the same He tried to walk in the hall yesterday and said he was not able to go far before stopping to take a break Currently on 2 L oxygen via [place] with SpO2 95-97%  Vital Signs .    Vitals:   02/17/23 0319 02/17/23 0335 02/17/23 0353 02/17/23 0749  BP: 98/63   90/62  Pulse: 83 83 81   Resp: 15   18  Temp: 98.2 F (36.8 C)   98.2 F (36.8 C)  TempSrc: Oral   Oral  SpO2: 97% 95% 97%   Weight: 83.9 kg     Height:        Intake/Output Summary (Last 24 hours) at 02/17/2023 0808 Last data filed at 02/17/2023 0320 Gross per 24 hour  Intake 1146.49 ml  Output 1350 ml  Net -203.51 ml      02/17/2023    3:19 AM 02/16/2023    2:48 AM 02/15/2023    3:25 AM  Last 3 Weights  Weight (lbs) 184 lb 14.4 oz 184 lb 1.4 oz 183 lb 6.8 oz  Weight (kg) 83.87 kg 83.5 kg 83.2 kg     Telemetry/ECG    Sinus rhythm, HR 80s - Personally Reviewed  Physical Exam .   GEN: No acute distress, on 2 L oxygen via [place] Neck: No JVD Cardiac: RRR, no murmurs, rubs, or gallops.  Respiratory: Diminished breath sounds at bilateral bases. GI: Soft, nontender, non-distended  MS: trace bilateral LE edema  Assessment & Plan .     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  Presented significantly volume overloaded on admission BNP 570 on admission, 943 this morning (trending down) CXR today not yet read but looks very similar to  yesterdays which showed possible multilobar PNA (left mid to lower lung), small bilateral pleural effusions, mild cardiomegaly  WBC 11.1 today, lactic acid 1.6, known URI TEE 2/6 showed LVEF 30-35%, mild MR, mild to moderate TR, trivial AR, trivial PR Net output - 9.5 L this admission Weight 184 lb today, down from 198 lb on admission Currently on 2 L oxygen via [place] Creatinine down to 1.15 from 1.3 yesterday  Co-ox today came back 92.4 up from 78.4 Transition to PO Lasix 40 mg BID Continue IV milrinone, will attempt to wean later today -- will discuss with MD  Patient will need RHC to assess volume status, possible LHC as well as he has not had a proper ischemic workup, cardiac MRI if cath negative Considering possible infection, will push out cath for now until improved and stable    Hypotension Most recent BP 90/62 with HR 81 before receiving AM midodrine dose   GDMT has been limited by hypotension  Continue midodrine 15 mg TID   Per primary AKI on CKD stage 3a URI Possible PNA Electrolyte disturbances  Alcohol abuse Alcohol induced fatty liver Chronic anemia   For questions or updates, please contact [facility] Please consult [email or web address] for contact info under        Signed, Olena Leatherwood, PA-C

## 2023-02-17 NOTE — TOC Progression Note (Signed)
Transition of Care [facility]) - Progression Note    Patient Details  Name: Justin Fleming MRN: [id number] Date of Birth: [birth year]  [facility] CM/SW Contact  Delilah Shan, LCSWA Phone Number: 02/17/2023, 5:06 PM  Clinical Narrative:     CSW spoke with patient about SNF  choice. Patient had some questions about [facility]. Patient request to speak with Bjorn Loser with [facility]. Rhonda with [facility] answered patients questions. Patient decided that he would like to change his SNF choice to [facility]. CSW LVM for [facility]. CSW awaiting call back to confirm SNF bed for patient. All questions answered. No further questions reported at this time.   Expected Discharge Plan:  (skilled vrs.home agreeable to fax out for snf) Barriers to Discharge: Continued Medical Work up  Expected Discharge Plan and Services In-house Referral: Clinical Social Work     Living arrangements for the past 2 months: Single Family Home                                       Social Determinants of Health (SDOH) Interventions SDOH Screenings   Food Insecurity: No Food Insecurity (02/08/2023)  Housing: Low Risk  (02/08/2023)  Transportation Needs: No Transportation Needs (02/08/2023)  Utilities: Not At Risk (02/08/2023)  Alcohol Screen: Low Risk  (02/02/2018)  Depression (PHQ2-9): Low Risk  (02/02/2018)  Social Connections: Socially Isolated (02/08/2023)  Tobacco Use: Medium Risk (02/08/2023)    Readmission Risk Interventions    01/11/2023   12:44 PM  Readmission Risk Prevention Plan  Transportation Screening Complete  PCP or Specialist Appt within 5-7 Days Not Complete  Home Care Screening Complete  Medication Review (RN CM) Complete

## 2023-02-18 ENCOUNTER — Encounter (HOSPITAL_COMMUNITY): Payer: Self-pay | Admitting: Cardiology

## 2023-02-18 ENCOUNTER — Encounter (HOSPITAL_COMMUNITY)
Admission: EM | Disposition: A | Discharge status: Skilled Nursing Facility | Payer: Self-pay | Source: Ambulatory Visit | Attending: Internal Medicine

## 2023-02-18 DIAGNOSIS — I4891 Unspecified atrial fibrillation: Secondary | ICD-10-CM | POA: Diagnosis not present

## 2023-02-18 DIAGNOSIS — I509 Heart failure, unspecified: Secondary | ICD-10-CM

## 2023-02-18 HISTORY — PX: RIGHT HEART CATH: CATH118263

## 2023-02-18 LAB — COOXEMETRY PANEL
Carboxyhemoglobin: 2.9 % — ABNORMAL HIGH (ref 0.5–1.5)
Methemoglobin: <0.7 % (ref 0.0–1.5)
O2 Saturation: 56.9 %
Total hemoglobin: 8.6 g/dL — ABNORMAL LOW (ref 12.0–16.0)

## 2023-02-18 LAB — CBC
HCT: 27.4 % — ABNORMAL LOW (ref 39.0–52.0)
Hemoglobin: 8.6 g/dL — ABNORMAL LOW (ref 13.0–17.0)
MCH: 29.2 pg (ref 26.0–34.0)
MCHC: 31.4 g/dL (ref 30.0–36.0)
MCV: 92.9 fL (ref 80.0–100.0)
Platelets: 522 10*3/uL — ABNORMAL HIGH (ref 150–400)
RBC: 2.95 MIL/uL — ABNORMAL LOW (ref 4.22–5.81)
RDW: 18.2 % — ABNORMAL HIGH (ref 11.5–15.5)
WBC: 9.7 10*3/uL (ref 4.0–10.5)
nRBC: 0 % (ref 0.0–0.2)

## 2023-02-18 LAB — POCT I-STAT EG7
Acid-Base Excess: 11 mmol/L — ABNORMAL HIGH (ref 0.0–2.0)
Acid-Base Excess: 12 mmol/L — ABNORMAL HIGH (ref 0.0–2.0)
Bicarbonate: 37.3 mmol/L — ABNORMAL HIGH (ref 20.0–28.0)
Bicarbonate: 37.7 mmol/L — ABNORMAL HIGH (ref 20.0–28.0)
Calcium, Ion: 1.13 mmol/L — ABNORMAL LOW (ref 1.15–1.40)
Calcium, Ion: 1.14 mmol/L — ABNORMAL LOW (ref 1.15–1.40)
HCT: 28 % — ABNORMAL LOW (ref 39.0–52.0)
HCT: 29 % — ABNORMAL LOW (ref 39.0–52.0)
Hemoglobin: 9.5 g/dL — ABNORMAL LOW (ref 13.0–17.0)
Hemoglobin: 9.9 g/dL — ABNORMAL LOW (ref 13.0–17.0)
O2 Saturation: 60 %
O2 Saturation: 62 %
Potassium: 4.4 mmol/L (ref 3.5–5.1)
Potassium: 4.4 mmol/L (ref 3.5–5.1)
Sodium: 133 mmol/L — ABNORMAL LOW (ref 135–145)
Sodium: 133 mmol/L — ABNORMAL LOW (ref 135–145)
TCO2: 39 mmol/L — ABNORMAL HIGH (ref 22–32)
TCO2: 39 mmol/L — ABNORMAL HIGH (ref 22–32)
pCO2, Ven: 55.1 mm[Hg] (ref 44–60)
pCO2, Ven: 55.2 mm[Hg] (ref 44–60)
pH, Ven: 7.438 — ABNORMAL HIGH (ref 7.25–7.43)
pH, Ven: 7.443 — ABNORMAL HIGH (ref 7.25–7.43)
pO2, Ven: 31 mm[Hg] — CL (ref 32–45)
pO2, Ven: 32 mm[Hg] (ref 32–45)

## 2023-02-18 LAB — COMPREHENSIVE METABOLIC PANEL
ALT: 12 U/L (ref 0–44)
AST: 26 U/L (ref 15–41)
Albumin: 2.2 g/dL — ABNORMAL LOW (ref 3.5–5.0)
Alkaline Phosphatase: 53 U/L (ref 38–126)
Anion gap: 7 (ref 5–15)
BUN: 19 mg/dL (ref 8–23)
CO2: 34 mmol/L — ABNORMAL HIGH (ref 22–32)
Calcium: 8.3 mg/dL — ABNORMAL LOW (ref 8.9–10.3)
Chloride: 91 mmol/L — ABNORMAL LOW (ref 98–111)
Creatinine, Ser: 1.13 mg/dL (ref 0.61–1.24)
GFR, Estimated: >60 mL/min (ref >60)
Glucose, Bld: 94 mg/dL (ref 70–99)
Potassium: 4.3 mmol/L (ref 3.5–5.1)
Sodium: 132 mmol/L — ABNORMAL LOW (ref 135–145)
Total Bilirubin: 1.2 mg/dL (ref 0.0–1.2)
Total Protein: 6.3 g/dL — ABNORMAL LOW (ref 6.5–8.1)

## 2023-02-18 SURGERY — RIGHT HEART CATH
Anesthesia: LOCAL

## 2023-02-18 MED ORDER — LIDOCAINE HCL (PF) 1 % IJ SOLN
INTRAMUSCULAR | Status: AC
  Filled 2023-02-18: qty 30

## 2023-02-18 MED ORDER — LIDOCAINE HCL (PF) 1 % IJ SOLN
INTRAMUSCULAR | Status: DC | PRN
  Administered 2023-02-18: 5 mL

## 2023-02-18 SURGICAL SUPPLY — 5 items
CATH SWAN GANZ 7F STRAIGHT (CATHETERS) IMPLANT
KIT MICROPUNCTURE NIT STIFF (SHEATH) IMPLANT
PACK CARDIAC CATHETERIZATION (CUSTOM PROCEDURE TRAY) ×1 IMPLANT
SHEATH PINNACLE 7F 10CM (SHEATH) IMPLANT
SHEATH PROBE COVER 6X72 (BAG) IMPLANT

## 2023-02-18 NOTE — Plan of Care (Signed)
  Problem: Health Behavior/Discharge Planning: Goal: Ability to manage health-related needs will improve Outcome: Progressing   Problem: Clinical Measurements: Goal: Diagnostic test results will improve Outcome: Progressing Goal: Cardiovascular complication will be avoided Outcome: Progressing   Problem: Elimination: Goal: Will not experience complications related to bowel motility Outcome: Progressing   Problem: Education: Goal: Ability to demonstrate management of disease process will improve Outcome: Progressing Goal: Ability to verbalize understanding of medication therapies will improve Outcome: Progressing

## 2023-02-18 NOTE — Progress Notes (Signed)
Occupational Therapy Treatment Patient Details Name: Justin Fleming MRN: [id number] DOB: 11/18/1960 Today's Date: 02/18/2023   History of present illness 63 y.o. male presented 02/08/23 with tachycardia, febrile, upper respiratory symptoms. +Coronavirus OC43, Afib with RVR. 2/13 R heart cath.  PMH significant of a-fib, CHF, gout, GERD, alcohol abuse.   OT comments  Pt fatigued and having difficulty staying awake following R heart cath this morning. Limited session to bed mobility and sitting EOB for grooming and applying lotion to pt's back. Pt declined OOB to chair. Pt reports his legs feeling heavier than they have in previous sessions. Patient will benefit from continued inpatient follow up therapy, <3 hours/day       If plan is discharge home, recommend the following:  Assistance with cooking/housework;A lot of help with bathing/dressing/bathroom;Assist for transportation;Help with stairs or ramp for entrance;Two people to help with walking and/or transfers   Equipment Recommendations  Other (comment) (defer to next venue)    Recommendations for Other Services      Precautions / Restrictions Precautions Precautions: Fall Restrictions Weight Bearing Restrictions Per Provider Order: No       Mobility Bed Mobility Overal bed mobility: Needs Assistance Bed Mobility: Supine to Sit, Sit to Supine     Supine to sit: HOB elevated, Used rails, Min assist Sit to supine: Mod assist   General bed mobility comments: assist to raise trunk and for LEs back into bed, use of rail, increased time    Transfers                   General transfer comment: pt having difficulty stayin awake for EOB activities following earlier procedure, returned to supine     Balance Overall balance assessment: Needs assistance Sitting-balance support: Feet supported Sitting balance-Leahy Scale: Fair                                     ADL either performed or assessed with  clinical judgement   ADL Overall ADL's : Needs assistance/impaired     Grooming: Wash/dry hands;Wash/dry face;Oral care;Supervision/safety;Sitting                                      Extremity/Trunk Assessment              Energy manager: No apparent difficulties   Cognition Arousal: Alert Behavior During Therapy: Flat affect Cognition: No apparent impairments                                        Cueing      Exercises      Shoulder Instructions       General Comments      Pertinent Vitals/ Pain       Pain Assessment Pain Assessment: Faces Faces Pain Scale: Hurts a little bit Pain Location: B LEs Pain Descriptors / Indicators: Heaviness  Home Living                                          Prior Functioning/Environment  Frequency  Min 1X/week        Progress Toward Goals  OT Goals(current goals can now be found in the care plan section)  Progress towards OT goals: Not progressing toward goals - comment (fatigued following heart cath)  Acute Rehab OT Goals OT Goal Formulation: With patient Time For Goal Achievement: 02/24/23 Potential to Achieve Goals: Good  Plan      Co-evaluation                 AM-PAC OT "6 Clicks" Daily Activity     Outcome Measure   Help from another person eating meals?: None Help from another person taking care of personal grooming?: A Little Help from another person toileting, which includes using toliet, bedpan, or urinal?: A Lot Help from another person bathing (including washing, rinsing, drying)?: A Lot Help from another person to put on and taking off regular upper body clothing?: A Little Help from another person to put on and taking off regular lower body clothing?: Total 6 Click Score: 15    End of Session    OT Visit Diagnosis: Pain;Muscle weakness  (generalized) (M62.81)   Activity Tolerance Patient limited by fatigue   Patient Left in bed;with call bell/phone within reach;with bed alarm set   Nurse Communication          Time: 5409-8119 OT Time Calculation (min): 18 min  Charges: OT General Charges $OT Visit: 1 Visit OT Treatments $Self Care/Home Management : 8-22 mins  Berna Spare, OTR/L Acute Rehabilitation Services Office: [phone]   Evern Bio 02/18/2023, 1:03 PM

## 2023-02-18 NOTE — Plan of Care (Signed)
  Problem: Education: Goal: Knowledge of General Education information will improve Description: Including pain rating scale, medication(s)/side effects and non-pharmacologic comfort measures Outcome: Progressing   Problem: Health Behavior/Discharge Planning: Goal: Ability to manage health-related needs will improve Outcome: Progressing   Problem: Clinical Measurements: Goal: Ability to maintain clinical measurements within normal limits will improve Outcome: Progressing Goal: Will remain free from infection Outcome: Progressing Goal: Diagnostic test results will improve Outcome: Progressing Goal: Respiratory complications will improve Outcome: Progressing Goal: Cardiovascular complication will be avoided Outcome: Progressing   Problem: Activity: Goal: Risk for activity intolerance will decrease Outcome: Progressing   Problem: Nutrition: Goal: Adequate nutrition will be maintained Outcome: Progressing   Problem: Coping: Goal: Level of anxiety will decrease Outcome: Progressing   Problem: Elimination: Goal: Will not experience complications related to bowel motility Outcome: Progressing Goal: Will not experience complications related to urinary retention Outcome: Progressing   Problem: Pain Managment: Goal: General experience of comfort will improve and/or be controlled Outcome: Progressing   Problem: Safety: Goal: Ability to remain free from injury will improve Outcome: Progressing   Problem: Skin Integrity: Goal: Risk for impaired skin integrity will decrease Outcome: Progressing   Problem: Education: Goal: Ability to demonstrate management of disease process will improve Outcome: Progressing Goal: Ability to verbalize understanding of medication therapies will improve Outcome: Progressing Goal: Individualized Educational Video(s) Outcome: Progressing   Problem: Activity: Goal: Capacity to carry out activities will improve Outcome: Progressing    Problem: Cardiac: Goal: Ability to achieve and maintain adequate cardiopulmonary perfusion will improve Outcome: Progressing   Problem: Education: Goal: Knowledge of disease or condition will improve Outcome: Progressing Goal: Understanding of medication regimen will improve Outcome: Progressing Goal: Individualized Educational Video(s) Outcome: Progressing   Problem: Activity: Goal: Ability to tolerate increased activity will improve Outcome: Progressing   Problem: Cardiac: Goal: Ability to achieve and maintain adequate cardiopulmonary perfusion will improve Outcome: Progressing   Problem: Health Behavior/Discharge Planning: Goal: Ability to safely manage health-related needs after discharge will improve Outcome: Progressing

## 2023-02-18 NOTE — Progress Notes (Signed)
PROGRESS NOTE    Justin Fleming  UJW:[id number] DOB: May 11, 1960 DOA: 02/08/2023 PCP: [name], FNP   Brief Narrative:  The patient is a 63 year old AAM with a past medical history significant for atrial fibrillation, CHF, gout, GERD, history of alcohol abuse and other comorbidities who continues to drink significant alcohol.  Is admitted with A-fib with RVR and acute on chronic systolic congestive heart failure. He also was noted to have worsening renal function.  Details of hospitalization as below.  Assessment & Plan:   Principal Problem:   Atrial fibrillation with RVR (HCC) Active Problems:   Alcohol abuse   Elevated troponin   Atrial fibrillation with rapid ventricular response (HCC)   Chronic anemia   Acute on chronic diastolic CHF (congestive heart failure) (HCC)   Alcohol induced fatty liver   URI (upper respiratory infection)  Atrial Fibrillation with RVR (HCC) s/p TEE/DCCV and conversion to NSR -TSH was 2.743. Patient was on amiodarone drip and transitioned to oral amiodarone as below however it has been changed patient back to amiodarone drip given that he was started on milrinone but milrinone was stopped and he was transitioned to oral amiodarone again on 02/17/2023.  Patient not on beta-blocker due to hypotension. 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 -Status post TEE/DCCV 02/11/23, Findings on TEE showed "Moderate reduced LV function, Mild MR,+PFO,Negative study for LAA thrombus".  Management per cardiology.  Per cardiology note, plan for right heart catheter today.   Acute respiratory failure with hypoxia secondary to acute on Chronic Systolic CHF/Cardiomyopathy: -Echocardiogram done and showed an LVEF of 30 to 35% with global hypokinesis, moderately reduced right ventricular function, moderate biatrial enlargement and mild to moderate MR; this EF is down from 50 to 55% back in December Patient was diuresed with IV Lasix, started on  midodrine to support blood pressures and feel that his GDMT is limited otherwise.  Patient is net negative almost 10 L since admission.  He has been transitioned to oral Lasix 02/17/2023.  Cardiology managing.  Patient is weaned to room air now.   Alcohol Abuse, Continuous -Counseled to quit alcohol.  No signs of withdrawal this admission.   Chronic Normocytic Anemia, stable  Baseline hemoglobin appears to be between 8-10.  Hemoglobin gradually dropping and today it is 7.7.  Will continue to monitor and target to transfuse if less than 7.5 due to acute CHF and hypoxia.  Hypokalemia Resolved   Hypomagnesemia Resolved   AKI vs CKD stage IIIa ruled out.  Just clarifying that previous to this hospitalization, patient's baseline creatinine appears to be between 1.07-1.23.  However during this hospitalization patient's creatinine peaked at 1.49, that was on day of admission.  Patient does not carry any history of CKD based on previous creatinine.  Creatinine has now improved and back to 1.15 which is normal for him.  Based on this, patient had AKI only which has now resolved.  Hyponatremia: Fluctuating, 129 today.  Monitor daily.   Elevated Troponin -In the setting of tachycardia, demand ischemia. -Troponin went from 68 -> 67 -Continue to monitor  -Cardiology is following   Alcohol Induced Fatty Liver -Refer to GI team on discharge.   URI (Upper Respiratory Infection) with Coronavirus OC43 (Not COVID)/community-acquired pneumonia -Respiratory panel significant for Coronavirus OC43.  Blood culture negative.  Chest x-ray from 02/16/2023 shows left middle and lower lobe infiltrates, consistent with pneumonia.  Patient has been started on Rocephin and doxycycline which I will continue for total of 5 days.  Patient  did spike fever of 102.2 at 4 PM on 02/16/2023.  Afebrile ever since.  Tobacco Abuse I have discussed tobacco cessation with the patient.  I have counseled the patient regarding the  negative impacts of continued tobacco use including but not limited to lung cancer, COPD, and cardiovascular disease.  I have discussed alternatives to tobacco and modalities that may help facilitate tobacco cessation including but not limited to biofeedback, hypnosis, and medications.  Total time spent with tobacco counseling was 5 minutes.  DVT prophylaxis: Eliquis   Code Status: Full Code  Family Communication:  None present at bedside.  Plan of care discussed with patient in length and he/she verbalized understanding and agreed with it.  Status is: Inpatient Remains inpatient appropriate because: Cardiac cath today   Estimated body mass index is 26.32 kg/m as calculated from the following:   Height as of this encounter: 5\' 10"  (1.778 m).   Weight as of this encounter: 83.2 kg.    Nutritional Assessment: Body mass index is 26.32 kg/m.Marland Kitchen Seen by dietician.  I agree with the assessment and plan as outlined below: Nutrition Status:        . Skin Assessment: I have examined the patient's skin and I agree with the wound assessment as performed by the wound care RN as outlined below:    Consultants:  Cardiology  Procedures:  As above  Antimicrobials:  Anti-infectives (From admission, onward)    Start     Dose/Rate Route Frequency Ordered Stop   02/16/23 2200  doxycycline (VIBRA-TABS) tablet 100 mg        100 mg Oral Every 12 hours 02/16/23 1359     02/15/23 1915  cefTRIAXone (ROCEPHIN) 1 g in sodium chloride 0.9 % 100 mL IVPB        1 g 200 mL/hr over 30 Minutes Intravenous Every 24 hours 02/15/23 1828     02/15/23 1915  azithromycin (ZITHROMAX) 500 mg in sodium chloride 0.9 % 250 mL IVPB  Status:  Discontinued        500 mg 250 mL/hr over 60 Minutes Intravenous Every 24 hours 02/15/23 1828 02/16/23 1359         Subjective: Patient seen and examined.  Feels well.  Appears slightly depressed.  Has no complaints.  Denied any chest pain, palpitation or shortness of  breath.  Objective: Vitals:   02/17/23 2025 02/18/23 0317 02/18/23 0319 02/18/23 0746  BP: 124/83  111/68 90/64  Pulse: 83  83   Resp: 19  19 17   Temp: 98.1 F (36.7 C)  98.3 F (36.8 C) 98 F (36.7 C)  TempSrc: Oral  Oral Oral  SpO2:   91%   Weight:  83.2 kg    Height:        Intake/Output Summary (Last 24 hours) at 02/18/2023 0830 Last data filed at 02/18/2023 0320 Gross per 24 hour  Intake 901.78 ml  Output 1150 ml  Net -248.22 ml   Filed Weights   02/16/23 0248 02/17/23 0319 02/18/23 0317  Weight: 83.5 kg 83.9 kg 83.2 kg    Examination:  General exam: Appears calm and comfortable  Respiratory system: Rhonchi right middle and lower lobe. Respiratory effort normal. Cardiovascular system: S1 & S2 heard, RRR. No JVD, murmurs, rubs, gallops or clicks.  +1 pitting edema bilateral lower extremity Gastrointestinal system: Abdomen is nondistended, soft and nontender. No organomegaly or masses felt. Normal bowel sounds heard. Central nervous system: Alert and oriented. No focal neurological deficits. Extremities: Symmetric 5 x 5  power. Skin: No rashes, lesions or ulcers.  Psychiatry: Judgement and insight appear normal. Mood & affect appropriate.   Data Reviewed: I have personally reviewed following labs and imaging studies  CBC: Recent Labs  Lab 02/12/23 0931 02/13/23 0537 02/14/23 0622 02/15/23 0409 02/16/23 0420 02/17/23 0528  WBC 7.0 7.7 8.6 10.3 12.3* 11.1*  NEUTROABS 5.4 5.8 6.3 7.6 8.8*  --   HGB 8.9* 8.1* 8.3* 8.3* 8.1* 7.7*  HCT 27.9* 24.8* 25.5* 25.7* 25.4* 24.5*  MCV 94.9 94.7 93.8 93.5 93.4 93.9  PLT 190 218 257 321 409* 442*   Basic Metabolic Panel: Recent Labs  Lab 02/12/23 0931 02/13/23 0537 02/14/23 0622 02/15/23 0409 02/16/23 0420 02/17/23 0528  NA  --  137 135 133* 136 129*  K  --  3.2* 3.8 4.0 3.6 4.1  CL  --  94* 91* 83* 85* 80*  CO2  --  32 33* 34* 36* 33*  GLUCOSE  --  120* 112* 157* 112* 269*  BUN  --  23 21 19 21 18   CREATININE   --  1.41* 1.30* 1.61* 1.35* 1.15  CALCIUM  --  8.3* 8.2* 8.7* 8.6* 8.6*  MG 2.0 1.6* 1.7 1.7 2.2 2.0  PHOS 3.3 3.3 3.0 3.2 4.1  --    GFR: Estimated Creatinine Clearance: 66.1 mL/min (by C-G formula based on SCr of 1.15 mg/dL). Liver Function Tests: Recent Labs  Lab 02/13/23 0537 02/14/23 0622 02/15/23 0409 02/16/23 0420 02/17/23 0528  AST 21 21 21 23 23   ALT 12 12 11 11 10   ALKPHOS 58 57 54 52 52  BILITOT 1.3* 1.3* 1.3* 1.1 1.0  PROT 5.9* 5.8* 5.8* 5.8* 6.1*  ALBUMIN 2.2* 2.2* 2.2* 2.1* 2.1*   No results for input(s): "LIPASE", "AMYLASE" in the last 168 hours. No results for input(s): "AMMONIA" in the last 168 hours. Coagulation Profile: No results for input(s): "INR", "PROTIME" in the last 168 hours. Cardiac Enzymes: No results for input(s): "CKTOTAL", "CKMB", "CKMBINDEX", "TROPONINI" in the last 168 hours. BNP (last 3 results) No results for input(s): "PROBNP" in the last 8760 hours. HbA1C: No results for input(s): "HGBA1C" in the last 72 hours. CBG: No results for input(s): "GLUCAP" in the last 168 hours. Lipid Profile: No results for input(s): "CHOL", "HDL", "LDLCALC", "TRIG", "CHOLHDL", "LDLDIRECT" in the last 72 hours. Thyroid Function Tests: No results for input(s): "TSH", "T4TOTAL", "FREET4", "T3FREE", "THYROIDAB" in the last 72 hours. Anemia Panel: No results for input(s): "VITAMINB12", "FOLATE", "FERRITIN", "TIBC", "IRON", "RETICCTPCT" in the last 72 hours. Sepsis Labs: Recent Labs  Lab 02/12/23 0931 02/12/23 1223 02/16/23 0935 02/16/23 1200 02/17/23 0528  PROCALCITON  --   --   --   --  0.48  LATICACIDVEN 1.6 1.0 0.9 1.6  --     Recent Results (from the past 240 hours)  Resp panel by RT-PCR (RSV, Flu A&B, Covid) Anterior Nasal Swab     Status: None   Collection Time: 02/08/23  4:39 PM   Specimen: Anterior Nasal Swab  Result Value Ref Range Status   SARS Coronavirus 2 by RT PCR NEGATIVE NEGATIVE Final   Influenza A by PCR NEGATIVE NEGATIVE Final    Influenza B by PCR NEGATIVE NEGATIVE Final    Comment: (NOTE) The Xpert Xpress SARS-CoV-2/FLU/RSV plus assay is intended as an aid in the diagnosis of influenza from Nasopharyngeal swab specimens and should not be used as a sole basis for treatment. Nasal washings and aspirates are unacceptable for Xpert Xpress SARS-CoV-2/FLU/RSV testing.  Fact  Sheet for Patients: BloggerCourse.com  Fact Sheet for Healthcare Providers: SeriousBroker.it  This test is not yet approved or cleared by the [place] FDA and has been authorized for detection and/or diagnosis of SARS-CoV-2 by FDA under an Emergency Use Authorization (EUA). This EUA will remain in effect (meaning this test can be used) for the duration of the COVID-19 declaration under Section 564(b)(1) of the Act, 21 U.S.C. section 360bbb-3(b)(1), unless the authorization is terminated or revoked.     Resp Syncytial Virus by PCR NEGATIVE NEGATIVE Final    Comment: (NOTE) Fact Sheet for Patients: BloggerCourse.com  Fact Sheet for Healthcare Providers: SeriousBroker.it  This test is not yet approved or cleared by the [place] FDA and has been authorized for detection and/or diagnosis of SARS-CoV-2 by FDA under an Emergency Use Authorization (EUA). This EUA will remain in effect (meaning this test can be used) for the duration of the COVID-19 declaration under Section 564(b)(1) of the Act, 21 U.S.C. section 360bbb-3(b)(1), unless the authorization is terminated or revoked.  Performed at [facility], [address] [place], [place] [address]   Culture, blood (routine x 2)     Status: None   Collection Time: 02/08/23  4:39 PM   Specimen: BLOOD LEFT HAND  Result Value Ref Range Status   Specimen Description BLOOD LEFT HAND  Final   Special Requests   Final    BOTTLES DRAWN AEROBIC AND ANAEROBIC Blood Culture results  may not be optimal due to an inadequate volume of blood received in culture bottles   Culture   Final    NO GROWTH 5 DAYS Performed at [facility], [address], [place], [place] [address]    Report Status 02/13/2023 FINAL  Final  Culture, blood (routine x 2)     Status: None   Collection Time: 02/08/23  4:39 PM   Specimen: BLOOD LEFT FOREARM  Result Value Ref Range Status   Specimen Description BLOOD LEFT FOREARM  Final   Special Requests   Final    BOTTLES DRAWN AEROBIC AND ANAEROBIC Blood Culture results may not be optimal due to an inadequate volume of blood received in culture bottles   Culture   Final    NO GROWTH 5 DAYS Performed at [facility], [address], [place], [place] [address]    Report Status 02/13/2023 FINAL  Final  Respiratory (~20 pathogens) panel by PCR     Status: Abnormal   Collection Time: 02/08/23  4:39 PM   Specimen: Nasopharyngeal Swab; Respiratory  Result Value Ref Range Status   Adenovirus NOT DETECTED NOT DETECTED Final   Coronavirus 229E NOT DETECTED NOT DETECTED Final    Comment: (NOTE) The Coronavirus on the Respiratory Panel, DOES NOT test for the novel  Coronavirus (2019 nCoV)    Coronavirus HKU1 NOT DETECTED NOT DETECTED Final   Coronavirus NL63 NOT DETECTED NOT DETECTED Final   Coronavirus OC43 DETECTED (A) NOT DETECTED Final   Metapneumovirus NOT DETECTED NOT DETECTED Final   Rhinovirus / Enterovirus NOT DETECTED NOT DETECTED Final   Influenza A NOT DETECTED NOT DETECTED Final   Influenza B NOT DETECTED NOT DETECTED Final   Parainfluenza Virus 1 NOT DETECTED NOT DETECTED Final   Parainfluenza Virus 2 NOT DETECTED NOT DETECTED Final   Parainfluenza Virus 3 NOT DETECTED NOT DETECTED Final   Parainfluenza Virus 4 NOT DETECTED NOT DETECTED Final   Respiratory Syncytial Virus NOT DETECTED NOT DETECTED Final   Bordetella pertussis NOT  DETECTED NOT DETECTED Final   Bordetella Parapertussis NOT DETECTED NOT DETECTED Final    Chlamydophila pneumoniae NOT DETECTED NOT DETECTED Final   Mycoplasma pneumoniae NOT DETECTED NOT DETECTED Final    Comment: Performed at [facility], [address] [place], [place] [address]  MRSA Next Gen by PCR, Nasal     Status: None   Collection Time: 02/17/23  9:30 AM   Specimen: Nasal Mucosa; Nasal Swab  Result Value Ref Range Status   MRSA by PCR Next Gen NOT DETECTED NOT DETECTED Final    Comment: (NOTE) The GeneXpert MRSA Assay (FDA approved for NASAL specimens only), is one component of a comprehensive MRSA colonization surveillance program. It is not intended to diagnose MRSA infection nor to guide or monitor treatment for MRSA infections. Test performance is not FDA approved in patients less than 40 years old. Performed at [facility], [address] [place], [place] [address]      Radiology Studies: No results found.   Scheduled Meds:  amiodarone  200 mg Oral BID   Followed by   Melene Muller ON 02/24/2023] amiodarone  200 mg Oral Daily   apixaban  5 mg Oral BID   diclofenac Sodium  2 g Topical QID   doxycycline  100 mg Oral Q12H   feeding supplement  237 mL Oral BID BM   furosemide  40 mg Oral BID   guaiFENesin  1,200 mg Oral BID   midodrine  15 mg Oral TID WC   pantoprazole  40 mg Oral BID   sodium chloride flush  10-40 mL Intracatheter Q12H   sodium chloride flush  3 mL Intravenous Q12H   thiamine  100 mg Oral Daily   Continuous Infusions:  cefTRIAXone (ROCEPHIN)  IV 1 g (02/17/23 1649)     LOS: 9 days   Hughie Closs, MD [facility]  02/18/2023, 8:30 AM   *Please note that this is a verbal dictation therefore any spelling or grammatical errors are due to the "Dragon Medical One" system interpretation.  Please page via Amion and do not message via secure chat for urgent patient care matters. Secure chat can be used for non urgent patient care matters.  How to contact the [facility] Attending or Consulting provider 7A - 7P or covering  provider during after hours 7P -7A, for this patient?  Check the care team in [facility] and look for a) attending/consulting TRH provider listed and b) the [facility] team listed. Page or secure chat 7A-7P. Log into [email or web address] and use [place]'s universal password to access. If you do not have the password, please contact the hospital operator. Locate the [facility] provider you are looking for under [facility] and page to a number that you can be directly reached. If you still have difficulty reaching the provider, please page the [facility] (Director on Call) for the Hospitalists listed on amion for assistance.

## 2023-02-18 NOTE — TOC Progression Note (Addendum)
Transition of Care [facility]) - Progression Note    Patient Details  Name: Justin Fleming MRN: [id number] Date of Birth: June 14, 1960  [facility] CM/SW Contact  Delilah Shan, LCSWA Phone Number: 02/18/2023, 3:34 PM  Clinical Narrative:     CSW spoke with Alvino Chapel with [facility] who confirmed SNF bed for patient. CSW will start insurance authorization for patient. CSW will continue to follow.  Update- CSW started insurance authorization for patient. Auth ID# [id number]. Insurance authorization was approved from 2/14-2/18. CSW will continue to follow.  Expected Discharge Plan:  (skilled vrs.home agreeable to fax out for snf) Barriers to Discharge: Continued Medical Work up  Expected Discharge Plan and Services In-house Referral: Clinical Social Work     Living arrangements for the past 2 months: Single Family Home                                       Social Determinants of Health (SDOH) Interventions SDOH Screenings   Food Insecurity: No Food Insecurity (02/08/2023)  Housing: Low Risk  (02/08/2023)  Transportation Needs: No Transportation Needs (02/08/2023)  Utilities: Not At Risk (02/08/2023)  Alcohol Screen: Low Risk  (02/02/2018)  Depression (PHQ2-9): Low Risk  (02/02/2018)  Social Connections: Socially Isolated (02/08/2023)  Tobacco Use: Medium Risk (02/08/2023)    Readmission Risk Interventions    01/11/2023   12:44 PM  Readmission Risk Prevention Plan  Transportation Screening Complete  PCP or Specialist Appt within 5-7 Days Not Complete  Home Care Screening Complete  Medication Review (RN CM) Complete

## 2023-02-18 NOTE — Interval H&P Note (Signed)
History and Physical Interval Note:  02/18/2023 11:08 AM  Justin Fleming  has presented today for surgery, with the diagnosis of heart failure.  The various methods of treatment have been discussed with the patient and family. After consideration of risks, benefits and other options for treatment, the patient has consented to  Procedure(s): RIGHT HEART CATH (N/A) as a surgical intervention.  The patient's history has been reviewed, patient examined, no change in status, stable for surgery.  I have reviewed the patient's chart and labs.  Questions were answered to the patient's satisfaction.     Trueman Worlds

## 2023-02-18 NOTE — Progress Notes (Signed)
Patient Name: Justin Fleming Date of Encounter: 02/18/2023 [facility] Cardiologist: Nona Dell, MD   Interval Summary  .    Patient resting in bed States his legs are feeling heavy compared to yesterday  Denies any shortness of breath or chest pain  Currently on room air Made NPO and scheduled for RHC this afternoon to evaluate volume status   Vital Signs .    Vitals:   02/17/23 2025 02/18/23 0317 02/18/23 0319 02/18/23 0746  BP: 124/83  111/68 90/64  Pulse: 83  83   Resp: 19  19 17   Temp: 98.1 F (36.7 C)  98.3 F (36.8 C) 98 F (36.7 C)  TempSrc: Oral  Oral Oral  SpO2:   91%   Weight:  83.2 kg    Height:       Intake/Output Summary (Last 24 hours) at 02/18/2023 0831 Last data filed at 02/18/2023 0320 Gross per 24 hour  Intake 901.78 ml  Output 1150 ml  Net -248.22 ml      02/18/2023    3:17 AM 02/17/2023    3:19 AM 02/16/2023    2:48 AM  Last 3 Weights  Weight (lbs) 183 lb 6.4 oz 184 lb 14.4 oz 184 lb 1.4 oz  Weight (kg) 83.19 kg 83.87 kg 83.5 kg     Telemetry/ECG    Sinus rhythm, HR 70-80s - Personally Reviewed  Physical Exam .   GEN: No acute distress, on room air  Neck: No JVD Cardiac: RRR, no murmurs, rubs, or gallops.  Respiratory: Clear to auscultation bilaterally. GI: Soft, nontender, non-distended  MS: trace bilateral LE edema  Assessment & Plan .     Persistent atrial fibrillation with RVR s/p TEE DCCV 02/11/23 Conversion to NSR Patient has maintained NSR with HR 80-90s Normal TSH CHA2DS2-VASc score is 3  Continue Eliquis 5 mg BID Continue PO amiodarone 200 mg BID for 7 days (started 2/12), then 200 mg daily    Acute HFrEF/cardiomyopathy, likely tachy mediated  Presented significantly volume overloaded on admission BNP 570 on admission, 943 most recently (trending down) Repeat CXR showed persistent but improved basilar opacities when compared with the previous day WBC 11.1 most recent, lactic acid 1.6, known URI  TEE 2/6  showed LVEF 30-35%, mild MR, mild to moderate TR, trivial AR, trivial PR Net output - 9.8 L this admission Weight 183 lb today, down from 198 lb on admission Currently on room air  Creatinine 1.15 yesterday, pending this morning labs   Co-ox today came back 56.9 today, 92.4 yesterday (not sure if we can trust this value) Continue PO Lasix 40 mg BID Milrinone was stopped yesterday 2/12  Made patient NPO  Scheduled for RHC this afternoon with Dr. Gasper Lloyd  Hold LHC for now, with recent cardioversion will not want to hold Eliquis, but okay to proceed with RHC    Hypotension Most recent BP 90/64 with HR 81 before receiving AM midodrine dose   GDMT has been limited by hypotension  Continue midodrine 15 mg TID   Per primary AKI  URI Possible PNA Electrolyte disturbances  Alcohol abuse Alcohol induced fatty liver Chronic anemia   Informed Consent   Shared Decision Making/Informed Consent The risks [stroke (1 in 1000), death (1 in 1000), kidney failure [usually temporary] (1 in 500), bleeding (1 in 200), allergic reaction [possibly serious] (1 in 200)], benefits (diagnostic support and management of coronary artery disease) and alternatives of a cardiac catheterization were discussed in detail with Mr. Manninen  and he is willing to proceed.    For questions or updates, please contact [facility] Please consult [email or web address] for contact info under       Signed, Olena Leatherwood, PA-C

## 2023-02-18 NOTE — Progress Notes (Signed)
Heart Failure Stewardship Pharmacist Progress Note   PCP: [name], FNP PCP-Cardiologist: Nona Dell, MD    HPI:  Justin Fleming is 66 YOM with PMH CHF, AF, HTN, gout, alcohol abuse, and depression  Patient recently had been admitted to a detox facility prior to visiting his PCP to establish care. At the visit with PCP on 2/3, patient demonstrated DOE, tachycardic, and was febrile. EKG obtained showed AF RVR prompting referral to ED. Upon arrival, pt states he had been experiencing SOB for 1-2 weeks with chills and weakness. He denied chest pain or palpitations. He was alert and oriented with no reported changes in appetite. Physical examination showed 3-4+ bilateral LEE to mid thigh, JVD to earlobe, and reduced breathing sounds observed. Labs obtained BNP 570.9, flat troponin, lactate 1.4, Scr 1.49, BUN 15, Na 142, K 3.7. CXR showed bilateral atelectasis in lung bases, cardiac enlargement, small R pleural effusion. He was given lasix 40 mg IV x1 and started on amiodarone IV gtt. ECHO on 2/4 LVEF 30-35% (prev 55-60%,12/2022), LV global hypokinesis, moderately reduced RV function, moderate bilateral enlargement, mild-moderate MV regurgitation.  Patient underwent successful TEE/DCCV on 2/6, and was converted to NSR with HR 70s. Post-procedure with poor urinary output, AMS, hypotensive, and slightly worse renal function. Concerned for low output. Lactic acid 1.6. PICC placed and initial coox was 54%. Started on milrinone with improvements in coox to 73%.  Today, patient reports he is feeling ok. States that the pain in his lower extremities has improved but remains present. He still has bilateral 1+ pitting LEE. He states the pain in his legs has stopped him attempting to stand and walk more. Coox 57% this morning after milrinone infusion was stopped yesterday afternoon. His breathing is improved and he was not requiring oxygen prior to going for [facility]. Patient states he is able to lay  flat for short periods of time. BP remains low 90/70s and HR now 90s on midodrine 15 mg TID.  Denies palpitations or chest pain. RHC today showed normal-mildly elevated filling pressures (RA 6, PA 45/18, PCWP 17) and normal output (CO/CI 6/3).  Current HF Medications: Diuretic: furosemide 40 mg PO BID Other: midodrine 15 mg TID  Prior to admission HF Medications: Diuretic: furosemide 40 mg oral daily  Pertinent Lab Values: Serum creatinine 1.13, BUN 19, Potassium 4.4, Sodium 133 BNP 570.9 >964.6 pg/mL  Vital Signs: Weight: 184 lbs (admission weight: 198 lbs) Blood pressure: 90-100/60-70 mmHg Heart rate: 80-100 bpm  I/O: net -0.25 L yesterday; net -9.8 L since admission  Medication Assistance / Insurance Benefits Check: Does the patient have prescription insurance?  Yes Type of insurance plan: Medicare Advantage   Does the patient qualify for medication assistance through manufacturers or grants?   Pending  Outpatient Pharmacy:  Prior to admission [facility] [place], [place] on [address] Is the patient willing to use [facility] TOC pharmacy at discharge? Yes Is the patient willing to transition their outpatient pharmacy to utilize a [facility] outpatient pharmacy?   No    Assessment: 1. 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 NYHA class 2 symptoms. - Patient is slightly hypervolemic upon exam with 1+ pitting LEE and elevated PCWP at 17 on RHC. Strict I/Os and daily weights.  - Continue diuresis with furosemide 40 mg PO BID - Continue midodrine 15 mg TID - Hold BB with recent discontinuation of milrinone and Coox of 57% - Hold RAASi, MRA, and SGLT2i with low  BP requiring midodrine and poor renal function.   Plan: 1) Medication changes recommended at this time: - Continue furosemide 40 mg PO BID  2) Patient assistance: - Patient has Medicare Advantage plan - Plan to discuss prescription access with patient prior to discharge  3)   Education  - To be completed prior to discharge  Wilmer Floor, PharmD PGY2 Cardiology Pharmacy Resident Heart Failure Stewardship Pharmacist Phone [phone]

## 2023-02-18 NOTE — H&P (View-Only) (Signed)
PROGRESS NOTE    Justin Fleming  UJW:[id number] DOB: May 11, 1960 DOA: 02/08/2023 PCP: [name], FNP   Brief Narrative:  The patient is a 63 year old AAM with a past medical history significant for atrial fibrillation, CHF, gout, GERD, history of alcohol abuse and other comorbidities who continues to drink significant alcohol.  Is admitted with A-fib with RVR and acute on chronic systolic congestive heart failure. He also was noted to have worsening renal function.  Details of hospitalization as below.  Assessment & Plan:   Principal Problem:   Atrial fibrillation with RVR (HCC) Active Problems:   Alcohol abuse   Elevated troponin   Atrial fibrillation with rapid ventricular response (HCC)   Chronic anemia   Acute on chronic diastolic CHF (congestive heart failure) (HCC)   Alcohol induced fatty liver   URI (upper respiratory infection)  Atrial Fibrillation with RVR (HCC) s/p TEE/DCCV and conversion to NSR -TSH was 2.743. Patient was on amiodarone drip and transitioned to oral amiodarone as below however it has been changed patient back to amiodarone drip given that he was started on milrinone but milrinone was stopped and he was transitioned to oral amiodarone again on 02/17/2023.  Patient not on beta-blocker due to hypotension. 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 -Status post TEE/DCCV 02/11/23, Findings on TEE showed "Moderate reduced LV function, Mild MR,+PFO,Negative study for LAA thrombus".  Management per cardiology.  Per cardiology note, plan for right heart catheter today.   Acute respiratory failure with hypoxia secondary to acute on Chronic Systolic CHF/Cardiomyopathy: -Echocardiogram done and showed an LVEF of 30 to 35% with global hypokinesis, moderately reduced right ventricular function, moderate biatrial enlargement and mild to moderate MR; this EF is down from 50 to 55% back in December Patient was diuresed with IV Lasix, started on  midodrine to support blood pressures and feel that his GDMT is limited otherwise.  Patient is net negative almost 10 L since admission.  He has been transitioned to oral Lasix 02/17/2023.  Cardiology managing.  Patient is weaned to room air now.   Alcohol Abuse, Continuous -Counseled to quit alcohol.  No signs of withdrawal this admission.   Chronic Normocytic Anemia, stable  Baseline hemoglobin appears to be between 8-10.  Hemoglobin gradually dropping and today it is 7.7.  Will continue to monitor and target to transfuse if less than 7.5 due to acute CHF and hypoxia.  Hypokalemia Resolved   Hypomagnesemia Resolved   AKI vs CKD stage IIIa ruled out.  Just clarifying that previous to this hospitalization, patient's baseline creatinine appears to be between 1.07-1.23.  However during this hospitalization patient's creatinine peaked at 1.49, that was on day of admission.  Patient does not carry any history of CKD based on previous creatinine.  Creatinine has now improved and back to 1.15 which is normal for him.  Based on this, patient had AKI only which has now resolved.  Hyponatremia: Fluctuating, 129 today.  Monitor daily.   Elevated Troponin -In the setting of tachycardia, demand ischemia. -Troponin went from 68 -> 67 -Continue to monitor  -Cardiology is following   Alcohol Induced Fatty Liver -Refer to GI team on discharge.   URI (Upper Respiratory Infection) with Coronavirus OC43 (Not COVID)/community-acquired pneumonia -Respiratory panel significant for Coronavirus OC43.  Blood culture negative.  Chest x-ray from 02/16/2023 shows left middle and lower lobe infiltrates, consistent with pneumonia.  Patient has been started on Rocephin and doxycycline which I will continue for total of 5 days.  Patient  did spike fever of 102.2 at 4 PM on 02/16/2023.  Afebrile ever since.  Tobacco Abuse I have discussed tobacco cessation with the patient.  I have counseled the patient regarding the  negative impacts of continued tobacco use including but not limited to lung cancer, COPD, and cardiovascular disease.  I have discussed alternatives to tobacco and modalities that may help facilitate tobacco cessation including but not limited to biofeedback, hypnosis, and medications.  Total time spent with tobacco counseling was 5 minutes.  DVT prophylaxis: Eliquis   Code Status: Full Code  Family Communication:  None present at bedside.  Plan of care discussed with patient in length and he/she verbalized understanding and agreed with it.  Status is: Inpatient Remains inpatient appropriate because: Cardiac cath today   Estimated body mass index is 26.32 kg/m as calculated from the following:   Height as of this encounter: 5\' 10"  (1.778 m).   Weight as of this encounter: 83.2 kg.    Nutritional Assessment: Body mass index is 26.32 kg/m.Marland Kitchen Seen by dietician.  I agree with the assessment and plan as outlined below: Nutrition Status:        . Skin Assessment: I have examined the patient's skin and I agree with the wound assessment as performed by the wound care RN as outlined below:    Consultants:  Cardiology  Procedures:  As above  Antimicrobials:  Anti-infectives (From admission, onward)    Start     Dose/Rate Route Frequency Ordered Stop   02/16/23 2200  doxycycline (VIBRA-TABS) tablet 100 mg        100 mg Oral Every 12 hours 02/16/23 1359     02/15/23 1915  cefTRIAXone (ROCEPHIN) 1 g in sodium chloride 0.9 % 100 mL IVPB        1 g 200 mL/hr over 30 Minutes Intravenous Every 24 hours 02/15/23 1828     02/15/23 1915  azithromycin (ZITHROMAX) 500 mg in sodium chloride 0.9 % 250 mL IVPB  Status:  Discontinued        500 mg 250 mL/hr over 60 Minutes Intravenous Every 24 hours 02/15/23 1828 02/16/23 1359         Subjective: Patient seen and examined.  Feels well.  Appears slightly depressed.  Has no complaints.  Denied any chest pain, palpitation or shortness of  breath.  Objective: Vitals:   02/17/23 2025 02/18/23 0317 02/18/23 0319 02/18/23 0746  BP: 124/83  111/68 90/64  Pulse: 83  83   Resp: 19  19 17   Temp: 98.1 F (36.7 C)  98.3 F (36.8 C) 98 F (36.7 C)  TempSrc: Oral  Oral Oral  SpO2:   91%   Weight:  83.2 kg    Height:        Intake/Output Summary (Last 24 hours) at 02/18/2023 0830 Last data filed at 02/18/2023 0320 Gross per 24 hour  Intake 901.78 ml  Output 1150 ml  Net -248.22 ml   Filed Weights   02/16/23 0248 02/17/23 0319 02/18/23 0317  Weight: 83.5 kg 83.9 kg 83.2 kg    Examination:  General exam: Appears calm and comfortable  Respiratory system: Rhonchi right middle and lower lobe. Respiratory effort normal. Cardiovascular system: S1 & S2 heard, RRR. No JVD, murmurs, rubs, gallops or clicks.  +1 pitting edema bilateral lower extremity Gastrointestinal system: Abdomen is nondistended, soft and nontender. No organomegaly or masses felt. Normal bowel sounds heard. Central nervous system: Alert and oriented. No focal neurological deficits. Extremities: Symmetric 5 x 5  power. Skin: No rashes, lesions or ulcers.  Psychiatry: Judgement and insight appear normal. Mood & affect appropriate.   Data Reviewed: I have personally reviewed following labs and imaging studies  CBC: Recent Labs  Lab 02/12/23 0931 02/13/23 0537 02/14/23 0622 02/15/23 0409 02/16/23 0420 02/17/23 0528  WBC 7.0 7.7 8.6 10.3 12.3* 11.1*  NEUTROABS 5.4 5.8 6.3 7.6 8.8*  --   HGB 8.9* 8.1* 8.3* 8.3* 8.1* 7.7*  HCT 27.9* 24.8* 25.5* 25.7* 25.4* 24.5*  MCV 94.9 94.7 93.8 93.5 93.4 93.9  PLT 190 218 257 321 409* 442*   Basic Metabolic Panel: Recent Labs  Lab 02/12/23 0931 02/13/23 0537 02/14/23 0622 02/15/23 0409 02/16/23 0420 02/17/23 0528  NA  --  137 135 133* 136 129*  K  --  3.2* 3.8 4.0 3.6 4.1  CL  --  94* 91* 83* 85* 80*  CO2  --  32 33* 34* 36* 33*  GLUCOSE  --  120* 112* 157* 112* 269*  BUN  --  23 21 19 21 18   CREATININE   --  1.41* 1.30* 1.61* 1.35* 1.15  CALCIUM  --  8.3* 8.2* 8.7* 8.6* 8.6*  MG 2.0 1.6* 1.7 1.7 2.2 2.0  PHOS 3.3 3.3 3.0 3.2 4.1  --    GFR: Estimated Creatinine Clearance: 66.1 mL/min (by C-G formula based on SCr of 1.15 mg/dL). Liver Function Tests: Recent Labs  Lab 02/13/23 0537 02/14/23 0622 02/15/23 0409 02/16/23 0420 02/17/23 0528  AST 21 21 21 23 23   ALT 12 12 11 11 10   ALKPHOS 58 57 54 52 52  BILITOT 1.3* 1.3* 1.3* 1.1 1.0  PROT 5.9* 5.8* 5.8* 5.8* 6.1*  ALBUMIN 2.2* 2.2* 2.2* 2.1* 2.1*   No results for input(s): "LIPASE", "AMYLASE" in the last 168 hours. No results for input(s): "AMMONIA" in the last 168 hours. Coagulation Profile: No results for input(s): "INR", "PROTIME" in the last 168 hours. Cardiac Enzymes: No results for input(s): "CKTOTAL", "CKMB", "CKMBINDEX", "TROPONINI" in the last 168 hours. BNP (last 3 results) No results for input(s): "PROBNP" in the last 8760 hours. HbA1C: No results for input(s): "HGBA1C" in the last 72 hours. CBG: No results for input(s): "GLUCAP" in the last 168 hours. Lipid Profile: No results for input(s): "CHOL", "HDL", "LDLCALC", "TRIG", "CHOLHDL", "LDLDIRECT" in the last 72 hours. Thyroid Function Tests: No results for input(s): "TSH", "T4TOTAL", "FREET4", "T3FREE", "THYROIDAB" in the last 72 hours. Anemia Panel: No results for input(s): "VITAMINB12", "FOLATE", "FERRITIN", "TIBC", "IRON", "RETICCTPCT" in the last 72 hours. Sepsis Labs: Recent Labs  Lab 02/12/23 0931 02/12/23 1223 02/16/23 0935 02/16/23 1200 02/17/23 0528  PROCALCITON  --   --   --   --  0.48  LATICACIDVEN 1.6 1.0 0.9 1.6  --     Recent Results (from the past 240 hours)  Resp panel by RT-PCR (RSV, Flu A&B, Covid) Anterior Nasal Swab     Status: None   Collection Time: 02/08/23  4:39 PM   Specimen: Anterior Nasal Swab  Result Value Ref Range Status   SARS Coronavirus 2 by RT PCR NEGATIVE NEGATIVE Final   Influenza A by PCR NEGATIVE NEGATIVE Final    Influenza B by PCR NEGATIVE NEGATIVE Final    Comment: (NOTE) The Xpert Xpress SARS-CoV-2/FLU/RSV plus assay is intended as an aid in the diagnosis of influenza from Nasopharyngeal swab specimens and should not be used as a sole basis for treatment. Nasal washings and aspirates are unacceptable for Xpert Xpress SARS-CoV-2/FLU/RSV testing.  Fact  Sheet for Patients: BloggerCourse.com  Fact Sheet for Healthcare Providers: SeriousBroker.it  This test is not yet approved or cleared by the [place] FDA and has been authorized for detection and/or diagnosis of SARS-CoV-2 by FDA under an Emergency Use Authorization (EUA). This EUA will remain in effect (meaning this test can be used) for the duration of the COVID-19 declaration under Section 564(b)(1) of the Act, 21 U.S.C. section 360bbb-3(b)(1), unless the authorization is terminated or revoked.     Resp Syncytial Virus by PCR NEGATIVE NEGATIVE Final    Comment: (NOTE) Fact Sheet for Patients: BloggerCourse.com  Fact Sheet for Healthcare Providers: SeriousBroker.it  This test is not yet approved or cleared by the [place] FDA and has been authorized for detection and/or diagnosis of SARS-CoV-2 by FDA under an Emergency Use Authorization (EUA). This EUA will remain in effect (meaning this test can be used) for the duration of the COVID-19 declaration under Section 564(b)(1) of the Act, 21 U.S.C. section 360bbb-3(b)(1), unless the authorization is terminated or revoked.  Performed at [facility], [address] [place], [place] [address]   Culture, blood (routine x 2)     Status: None   Collection Time: 02/08/23  4:39 PM   Specimen: BLOOD LEFT HAND  Result Value Ref Range Status   Specimen Description BLOOD LEFT HAND  Final   Special Requests   Final    BOTTLES DRAWN AEROBIC AND ANAEROBIC Blood Culture results  may not be optimal due to an inadequate volume of blood received in culture bottles   Culture   Final    NO GROWTH 5 DAYS Performed at [facility], [address], [place], [place] [address]    Report Status 02/13/2023 FINAL  Final  Culture, blood (routine x 2)     Status: None   Collection Time: 02/08/23  4:39 PM   Specimen: BLOOD LEFT FOREARM  Result Value Ref Range Status   Specimen Description BLOOD LEFT FOREARM  Final   Special Requests   Final    BOTTLES DRAWN AEROBIC AND ANAEROBIC Blood Culture results may not be optimal due to an inadequate volume of blood received in culture bottles   Culture   Final    NO GROWTH 5 DAYS Performed at [facility], [address], [place], [place] [address]    Report Status 02/13/2023 FINAL  Final  Respiratory (~20 pathogens) panel by PCR     Status: Abnormal   Collection Time: 02/08/23  4:39 PM   Specimen: Nasopharyngeal Swab; Respiratory  Result Value Ref Range Status   Adenovirus NOT DETECTED NOT DETECTED Final   Coronavirus 229E NOT DETECTED NOT DETECTED Final    Comment: (NOTE) The Coronavirus on the Respiratory Panel, DOES NOT test for the novel  Coronavirus (2019 nCoV)    Coronavirus HKU1 NOT DETECTED NOT DETECTED Final   Coronavirus NL63 NOT DETECTED NOT DETECTED Final   Coronavirus OC43 DETECTED (A) NOT DETECTED Final   Metapneumovirus NOT DETECTED NOT DETECTED Final   Rhinovirus / Enterovirus NOT DETECTED NOT DETECTED Final   Influenza A NOT DETECTED NOT DETECTED Final   Influenza B NOT DETECTED NOT DETECTED Final   Parainfluenza Virus 1 NOT DETECTED NOT DETECTED Final   Parainfluenza Virus 2 NOT DETECTED NOT DETECTED Final   Parainfluenza Virus 3 NOT DETECTED NOT DETECTED Final   Parainfluenza Virus 4 NOT DETECTED NOT DETECTED Final   Respiratory Syncytial Virus NOT DETECTED NOT DETECTED Final   Bordetella pertussis NOT  DETECTED NOT DETECTED Final   Bordetella Parapertussis NOT DETECTED NOT DETECTED Final    Chlamydophila pneumoniae NOT DETECTED NOT DETECTED Final   Mycoplasma pneumoniae NOT DETECTED NOT DETECTED Final    Comment: Performed at [facility], [address] [place], [place] [address]  MRSA Next Gen by PCR, Nasal     Status: None   Collection Time: 02/17/23  9:30 AM   Specimen: Nasal Mucosa; Nasal Swab  Result Value Ref Range Status   MRSA by PCR Next Gen NOT DETECTED NOT DETECTED Final    Comment: (NOTE) The GeneXpert MRSA Assay (FDA approved for NASAL specimens only), is one component of a comprehensive MRSA colonization surveillance program. It is not intended to diagnose MRSA infection nor to guide or monitor treatment for MRSA infections. Test performance is not FDA approved in patients less than 40 years old. Performed at [facility], [address] [place], [place] [address]      Radiology Studies: No results found.   Scheduled Meds:  amiodarone  200 mg Oral BID   Followed by   Melene Muller ON 02/24/2023] amiodarone  200 mg Oral Daily   apixaban  5 mg Oral BID   diclofenac Sodium  2 g Topical QID   doxycycline  100 mg Oral Q12H   feeding supplement  237 mL Oral BID BM   furosemide  40 mg Oral BID   guaiFENesin  1,200 mg Oral BID   midodrine  15 mg Oral TID WC   pantoprazole  40 mg Oral BID   sodium chloride flush  10-40 mL Intracatheter Q12H   sodium chloride flush  3 mL Intravenous Q12H   thiamine  100 mg Oral Daily   Continuous Infusions:  cefTRIAXone (ROCEPHIN)  IV 1 g (02/17/23 1649)     LOS: 9 days   Hughie Closs, MD [facility]  02/18/2023, 8:30 AM   *Please note that this is a verbal dictation therefore any spelling or grammatical errors are due to the "Dragon Medical One" system interpretation.  Please page via Amion and do not message via secure chat for urgent patient care matters. Secure chat can be used for non urgent patient care matters.  How to contact the [facility] Attending or Consulting provider 7A - 7P or covering  provider during after hours 7P -7A, for this patient?  Check the care team in [facility] and look for a) attending/consulting TRH provider listed and b) the [facility] team listed. Page or secure chat 7A-7P. Log into [email or web address] and use [place]'s universal password to access. If you do not have the password, please contact the hospital operator. Locate the [facility] provider you are looking for under [facility] and page to a number that you can be directly reached. If you still have difficulty reaching the provider, please page the [facility] (Director on Call) for the Hospitalists listed on amion for assistance.

## 2023-02-18 NOTE — Progress Notes (Signed)
OT Cancellation Note  Patient Details Name: Justin Fleming MRN: [id number] DOB: 1960-02-12   Cancelled Treatment:    Reason Eval/Treat Not Completed: Patient at procedure or test/ unavailable (R heart cath)  Evern Bio 02/18/2023, 11:10 AM Berna Spare, OTR/L Acute Rehabilitation Services Office: [phone]

## 2023-02-19 ENCOUNTER — Encounter (HOSPITAL_COMMUNITY): Payer: Self-pay | Admitting: Internal Medicine

## 2023-02-19 ENCOUNTER — Inpatient Hospital Stay: Payer: Medicare Other | Admitting: Student

## 2023-02-19 DIAGNOSIS — I4891 Unspecified atrial fibrillation: Secondary | ICD-10-CM | POA: Diagnosis not present

## 2023-02-19 LAB — COMPREHENSIVE METABOLIC PANEL
ALT: 14 U/L (ref 0–44)
AST: 27 U/L (ref 15–41)
Albumin: 2.2 g/dL — ABNORMAL LOW (ref 3.5–5.0)
Alkaline Phosphatase: 54 U/L (ref 38–126)
Anion gap: 10 (ref 5–15)
BUN: 26 mg/dL — ABNORMAL HIGH (ref 8–23)
CO2: 32 mmol/L (ref 22–32)
Calcium: 8.3 mg/dL — ABNORMAL LOW (ref 8.9–10.3)
Chloride: 89 mmol/L — ABNORMAL LOW (ref 98–111)
Creatinine, Ser: 1.45 mg/dL — ABNORMAL HIGH (ref 0.61–1.24)
GFR, Estimated: 53 mL/min — ABNORMAL LOW (ref >60)
Glucose, Bld: 100 mg/dL — ABNORMAL HIGH (ref 70–99)
Potassium: 4 mmol/L (ref 3.5–5.1)
Sodium: 131 mmol/L — ABNORMAL LOW (ref 135–145)
Total Bilirubin: 1 mg/dL (ref 0.0–1.2)
Total Protein: 6.4 g/dL — ABNORMAL LOW (ref 6.5–8.1)

## 2023-02-19 LAB — COOXEMETRY PANEL
Carboxyhemoglobin: 2.6 % — ABNORMAL HIGH (ref 0.5–1.5)
Methemoglobin: 0.9 % (ref 0.0–1.5)
O2 Saturation: 99.7 %
Total hemoglobin: 8.8 g/dL — ABNORMAL LOW (ref 12.0–16.0)

## 2023-02-19 MED ORDER — FUROSEMIDE 10 MG/ML IJ SOLN
60.0000 mg | Freq: Once | INTRAMUSCULAR | Status: AC
  Administered 2023-02-19: 60 mg via INTRAVENOUS
  Filled 2023-02-19: qty 6

## 2023-02-19 NOTE — TOC Progression Note (Addendum)
Transition of Care [facility]) - Progression Note    Patient Details  Name: AZAN MANERI MRN: [id number] Date of Birth: [birth year]  [facility] CM/SW Contact  Delilah Shan, LCSWA Phone Number: 02/19/2023, 12:22 PM  Clinical Narrative:     Patient has SNF bed at [facility] when medically ready. CSW informed MD. CSW will continue to follow and assist with patients dc planning needs.  Update- [facility] CSW that if patient medically ready over the weekend, that they can accept patient on Saturday. CSW informed MD.  Expected Discharge Plan:  (skilled vrs.home agreeable to fax out for snf) Barriers to Discharge: Continued Medical Work up  Expected Discharge Plan and Services In-house Referral: Clinical Social Work     Living arrangements for the past 2 months: Single Family Home                                       Social Determinants of Health (SDOH) Interventions SDOH Screenings   Food Insecurity: No Food Insecurity (02/08/2023)  Housing: Low Risk  (02/19/2023)  Transportation Needs: No Transportation Needs (02/19/2023)  Utilities: Not At Risk (02/08/2023)  Alcohol Screen: Low Risk  (02/19/2023)  Depression (PHQ2-9): Low Risk  (02/02/2018)  Financial Resource Strain: Low Risk  (02/19/2023)  Social Connections: Socially Isolated (02/08/2023)  Tobacco Use: Medium Risk (02/08/2023)    Readmission Risk Interventions    01/11/2023   12:44 PM  Readmission Risk Prevention Plan  Transportation Screening Complete  PCP or Specialist Appt within 5-7 Days Not Complete  Home Care Screening Complete  Medication Review (RN CM) Complete

## 2023-02-19 NOTE — Progress Notes (Addendum)
Rounding Note    Patient Name: Justin Fleming Date of Encounter: 02/19/2023  [facility] Cardiologist: Nona Dell, MD   Subjective   No acute overnight events. He is feeling better today compared to when he came in. He was able to walk from his bed to the chair and denies having any shortness of breath with this. He still has some mild edema but much improved from last week. No chest pain.  Inpatient Medications    Scheduled Meds:  amiodarone  200 mg Oral BID   Followed by   Melene Muller ON 02/24/2023] amiodarone  200 mg Oral Daily   apixaban  5 mg Oral BID   diclofenac Sodium  2 g Topical QID   doxycycline  100 mg Oral Q12H   feeding supplement  237 mL Oral BID BM   furosemide  60 mg Intravenous Once   guaiFENesin  1,200 mg Oral BID   midodrine  15 mg Oral TID WC   pantoprazole  40 mg Oral BID   sodium chloride flush  10-40 mL Intracatheter Q12H   sodium chloride flush  3 mL Intravenous Q12H   thiamine  100 mg Oral Daily   Continuous Infusions:  cefTRIAXone (ROCEPHIN)  IV 1 g (02/18/23 2108)   PRN Meds: acetaminophen **OR** acetaminophen, HYDROcodone-acetaminophen, levalbuterol, ondansetron **OR** ondansetron (ZOFRAN) IV, sodium chloride flush, sodium chloride flush   Vital Signs    Vitals:   02/18/23 2335 02/19/23 0310 02/19/23 0500 02/19/23 0700  BP: 104/76 102/85  92/65  Pulse: 77 71  66  Resp: 18 18  17   Temp: 98.9 F (37.2 C) 99 F (37.2 C)  97.7 F (36.5 C)  TempSrc: Oral Oral  Oral  SpO2: 99% 99%  100%  Weight:   81 kg   Height:        Intake/Output Summary (Last 24 hours) at 02/19/2023 1212 Last data filed at 02/19/2023 0917 Gross per 24 hour  Intake 1197 ml  Output 1800 ml  Net -603 ml      02/19/2023    5:00 AM 02/18/2023    3:17 AM 02/17/2023    3:19 AM  Last 3 Weights  Weight (lbs) 178 lb 9.2 oz 183 lb 6.4 oz 184 lb 14.4 oz  Weight (kg) 81 kg 83.19 kg 83.87 kg      Telemetry    Normal sinus rhythm with rates in the 70s. -  Personally Reviewed  ECG    No new ECG tracing today. - Personally Reviewed  Physical Exam   GEN: No acute distress.   Neck: No JVD. Cardiac: RRR. No murmurs, rubs, or gallops.  Respiratory: No increased work of breathing. Decreased breath sounds in bilateral bases. No wheezes, rhonchi, or rales appreciated. MS: Trace lower extremity edema bilaterally; No deformity. Skin: Warm and dry. Neuro:  No focal deficits. Psych: Normal affect. Responds appropriately.  Labs    High Sensitivity Troponin:   Recent Labs  Lab 02/08/23 1639 02/08/23 1844 02/08/23 2304 02/09/23 0222  TROPONINIHS 49* 53* 68* 67*     Chemistry Recent Labs  Lab 02/15/23 0409 02/16/23 0420 02/17/23 0528 02/18/23 0927 02/18/23 1137 02/19/23 0605  NA 133* 136 129* 132* 133*  133* 131*  K 4.0 3.6 4.1 4.3 4.4  4.4 4.0  CL 83* 85* 80* 91*  --  89*  CO2 34* 36* 33* 34*  --  32  GLUCOSE 157* 112* 269* 94  --  100*  BUN 19 21 18 19   --  26*  CREATININE 1.61* 1.35* 1.15 1.13  --  1.45*  CALCIUM 8.7* 8.6* 8.6* 8.3*  --  8.3*  MG 1.7 2.2 2.0  --   --   --   PROT 5.8* 5.8* 6.1* 6.3*  --  6.4*  ALBUMIN 2.2* 2.1* 2.1* 2.2*  --  2.2*  AST 21 23 23 26   --  27  ALT 11 11 10 12   --  14  ALKPHOS 54 52 52 53  --  54  BILITOT 1.3* 1.1 1.0 1.2  --  1.0  GFRNONAA 47* 58* >60 >60  --  53*  ANIONGAP 16* 15 16* 7  --  10    Lipids No results for input(s): "CHOL", "TRIG", "HDL", "LABVLDL", "LDLCALC", "CHOLHDL" in the last 168 hours.  Hematology Recent Labs  Lab 02/16/23 0420 02/17/23 0528 02/18/23 0927 02/18/23 1137  WBC 12.3* 11.1* 9.7  --   RBC 2.72* 2.61* 2.95*  --   HGB 8.1* 7.7* 8.6* 9.9*  9.5*  HCT 25.4* 24.5* 27.4* 29.0*  28.0*  MCV 93.4 93.9 92.9  --   MCH 29.8 29.5 29.2  --   MCHC 31.9 31.4 31.4  --   RDW 18.4* 18.4* 18.2*  --   PLT 409* 442* 522*  --    Thyroid No results for input(s): "TSH", "FREET4" in the last 168 hours.  BNP Recent Labs  Lab 02/15/23 0409 02/16/23 0500  BNP 964.6*  943.3*    DDimer No results for input(s): "DDIMER" in the last 168 hours.   Radiology    CARDIAC CATHETERIZATION Result Date: 02/18/2023 HEMODYNAMICS: RA:   6 mmHg (mean) RV:   45/6-10 mmHg PA:   45/18 mmHg (29 mean) PCWP:  17 mmHg (mean)    Estimated Fick CO/CI   6 L/min, 3 L/min/m2 Thermodilution CO/CI   5.6 L/min, 2.8 L/min/m2    TPG    12  mmHg     PVR     2-2.1 Wood Units PAPi      4.5  IMPRESSION: Normal to mildly elevated pre and post capillary filling pressures Normal cardiac output & index Aditya Sabharwal 11:46 AM   Cardiac Studies   TTE 02/09/2023: Impressions: 1. Left ventricular ejection fraction, by estimation, is 30 to 35%. The  left ventricle has moderately decreased function. The left ventricle  demonstrates global hypokinesis. Left ventricular diastolic function could  not be evaluated.   2. Right ventricular systolic function is moderately reduced. The right  ventricular size is moderately enlarged. Tricuspid regurgitation signal is  inadequate for assessing PA pressure.   3. Left atrial size was moderately dilated.   4. Right atrial size was moderately dilated.   5. The mitral valve is normal in structure. Mild to moderate mitral valve  regurgitation. No evidence of mitral stenosis.   6. The aortic valve is tricuspid. Aortic valve regurgitation is not  visualized. No aortic stenosis is present.  _______________   TEE/ DCCV 02/11/2023: Impressions: 1. Left ventricular ejection fraction, by estimation, is 30 to 35%. The  left ventricle has moderately decreased function.   2. No left atrial/left atrial appendage thrombus was detected.   3. Mild mitral valve regurgitation.   4. Tricuspid valve regurgitation is mild to moderate.   5. Aortic valve regurgitation is trivial.   6. There is a moderately sized patent foramen ovale with predominantly  right to left shunting across the atrial septum.   Conclusion(s)/Recommendation(s): No LA/LAA thrombus identified. Negative   bubble study for interatrial shunt.  No intracardiac source of embolism  detected on this on this transesophageal echocardiogram.   Patient Profile     63 y.o. male a history of persistent atrial fibrillation (on Amiodarone, Digoxin, and Eliquis), chronic HFpEF, hypotension on Midodrine, GERD and known gastritis with recent GI bleed in 01/2023, anxiety/ depression, and alcohol abuse who was admitted on 02/08/2023 with atrial fibrillation with RVR and acute on chronic CHF. Echo this admission showed a drop in EF to 30-35%.   Assessment & Plan    Atrial Fibrillation with RVR Initial diagnosed during and admission in 12/2022. He was started on Amiodarone, Digoxin, and Eliquis at that time. DCCV was not attempted due to alcohol abuse and concern for medication non-compliance. He now presents in rapid atrial fibrillation with rates as high as the 140s to 150s. He was started on IV Amiodarone and underwent successful TEE/ DCCV on 2/6. - Maintaining sinus rhythm with rates in the 70s. - Continue PO Amiodarone 200mg  twice daily. - No beta-blocker due to hypotension requiring Midodrine. - Continue Eliquis 5mg  twice daily.     Acute on Chronic HFrEF He was noted to be markedly volume overloaded on admission. BNP elevated at 570. Chest x-ray showed cardiac enlargement with small right pleural effusion. Echo showed LVEF of 30-35% with global hypokinesis, moderate reduced RV function, moderate biatrial enlargement, and milt to moderate MR. EF down from 50-55% in 12/2022 - likely tachymediated in nature. PICC line was placed on 2/7/ and he was started on IV Milrinone on to assist with IV diuresis given low CO-OX. Milrinone was weaned off on 2/12. Repeat CO-OX on 2/13 had dropped from 92 to 56. Therefore, he underwent RHC to help better assess volume status and cardiac output - this showed normal to mildly elevated pre and post capillary filling pressure and normal cardiac output/ index. Net negative 10.4 L this  admission. Creatinine up from 1.13 yesterday to 1.45 today. CO-OX 99 today. - Bump in creatinine may be due to renal congestion given mildly elevated filling pressures on RHC yesterday (PCWP was 17 mmHg). Will give an additional dose of IV Lasix 60mg  today and then can switch back to PO Lasix tomorrow. - No GDMT at this time given hypotension requiring Midodrine. - Cardiomyopathy may be secondary to rapid atrial fibrillation. However, he would still benefit from a LHC at some point (this was not performed yesterday as we were unable to hold anticoagulation after DCCV earlier this admission).   Hypotension Patient has a history of soft BP and was discharged on Midodrine during last admission in 12/2022. - BP soft but stable. - Continue Midodrine 15mg  three times daily.   Chronic Anemia History of GI Bleed FOBT was positive during last admission in 12/2022. EGD showed gastritis but no signs of bleeding.  - Hemoglobin stable at 8.6 today. - Continue Protonix 40mg  daily. - Management per primary team.   Acute on CKD Stage III Baseline creatinine around 1.1 to 1.2. Creatinine has fluctuated upon and down this admission. - Creatinine is up from 1.13 yesterday to 1.45 today. - Continue to monitor closely.    Otherwise, per primary team: - URI (coronaravirus OC43) - CAP - Alcohol abuse - Elevated Total Bili  - Alcohol induced fatty liver - Hypoalbuminemia - Hyponatremia  For questions or updates, please contact [facility] Please consult [email or web address] for contact info under        Signed, Corrin Parker, PA-C  02/19/2023, 12:12 PM    Patient seen and examined with  Marjie Skiff, PA-C.  Agree as above, with the following exceptions and changes as noted below.  Patient is feeling slightly better today and sitting in the bedside chair near the window. Gen: NAD, CV: RRR, no murmurs, Lungs: clear, Abd: soft, Extrem: Warm, well perfused, trace to 1+ edema, Neuro/Psych: alert  and oriented x 3, normal mood and affect. All available labs, radiology testing, previous records reviewed.  Acute on chronic HFrEF AKI on CKD stage III -Patient needs an ischemic evaluation but has recently been cardioverted and there is hesitation to hold Eliquis for procedure. -Right heart cath reviewed yesterday.  Recall patient was on milrinone for several days with very adequate diuresis, unfortunately patient did consume [employer] yesterday.  For unknown reasons his creatinine has risen again today, we will see if this is congestive in nature and give another dose of IV Lasix.  Otherwise he is on oral Lasix and overall reasonably stable. -Right heart cath data suggests normal cardiac output therefore continued milrinone is not indicated.  Co. ox readings may have been misleading. -If he can lay flat by Monday, may want to consider cardiac MRI to further understand the nature of his cardiomyopathy which may be tachycardia mediated but left heart cath is still pending.  Atrial fibrillation now status post cardioversion -Continue Eliquis 5 mg twice daily.  He is also on amiodarone 200 mg twice daily to complete loading and then will transition to 200 mg daily.  Given that he had a respiratory infection and pneumonia, short course of amiodarone may be in his best interest to maintain sinus rhythm which he is doing right now.  Hypotension -Patient is hypotensive and on midodrine, this does not seem to be from low output heart failure.    Parke Poisson, MD 02/19/23 1:58 PM

## 2023-02-19 NOTE — Progress Notes (Signed)
Heart Failure Stewardship Pharmacist Progress Note   PCP: [name], FNP PCP-Cardiologist: Nona Dell, MD    HPI:  Justin Fleming is 89 YOM with PMH CHF, AF, HTN, gout, alcohol abuse, and depression  Patient recently had been admitted to a detox facility prior to visiting his PCP to establish care. At the visit with PCP on 2/3, patient demonstrated DOE, tachycardic, and was febrile. EKG obtained showed AF RVR prompting referral to ED. Upon arrival, pt states he had been experiencing SOB for 1-2 weeks with chills and weakness. He denied chest pain or palpitations. He was alert and oriented with no reported changes in appetite. Physical examination showed 3-4+ bilateral LEE to mid thigh, JVD to earlobe, and reduced breathing sounds observed. Labs obtained BNP 570.9, flat troponin, lactate 1.4, Scr 1.49, BUN 15, Na 142, K 3.7. CXR showed bilateral atelectasis in lung bases, cardiac enlargement, small R pleural effusion. He was given lasix 40 mg IV x1 and started on amiodarone IV gtt. ECHO on 2/4 LVEF 30-35% (prev 55-60%,12/2022), LV global hypokinesis, moderately reduced RV function, moderate bilateral enlargement, mild-moderate MV regurgitation.  Patient underwent successful TEE/DCCV on 2/6, and was converted to NSR with HR 70s. Post-procedure with poor urinary output, AMS, hypotensive, and slightly worse renal function. Concerned for low output. Lactic acid 1.6. PICC placed and initial coox was 54%. Started on milrinone with improvements in coox to 73%.  RHC on 02/18/2023 showed normal-mildly elevated filling pressures (RA 6, PA 45/18, PCWP 17) and normal output (CO/CI 6/3).  Today, patient reports he is feeling ok. States that the pain in his lower extremities has improved but remains present. He still has bilateral 1+ pitting LEE. He states the pain in his legs has stopped him attempting to stand and walk more, but that he has been encouraged by OT. Coox 90+% (likely inaccurate) this  morning after milrinone infusion was stopped 02/17/2023 afternoon. His breathing is stable and he was has been requiring 2 L of supplemental oxygen. Patient states he is able to lay flat for short periods of time. BP remains low 90/70s and HR now 90s on midodrine 15 mg TID.  Denies palpitations or chest pain. Potential future LHC at some point.   Current HF Medications: Diuretic: furosemide 60 mg IV x1 Other: midodrine 15 mg TID  Prior to admission HF Medications: Diuretic: furosemide 40 mg PO daily  Pertinent Lab Values: Serum creatinine 1.45, BUN 26, Potassium 4.0, Sodium 131 BNP 570.9 >964.6 pg/mL  Vital Signs: Weight: 178 lbs (admission weight: 198 lbs) Blood pressure: 90-100/60-70 mmHg Heart rate: 70-80 bpm  I/O: net -0.5 L yesterday; net -10.3 L since admission  Medication Assistance / Insurance Benefits Check: Does the patient have prescription insurance?  Yes Type of insurance plan: Medicare Advantage   Does the patient qualify for medication assistance through manufacturers or grants?   Pending  Outpatient Pharmacy:  Prior to admission outpatient pharmacy: [facility] [place], [place] on [address] Is the patient willing to use [facility] pharmacy at discharge? Yes Is the patient willing to transition their outpatient pharmacy to utilize a [facility] outpatient pharmacy?   No    Assessment: 1. 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 NYHA class II symptoms. - Patient is slightly hypervolemic upon exam with 1+ pitting LEE and SOB requiring 2 L of supplemental oxygen; strict I/Os and daily weights.  - Continue diuresis with furosemide 60 mg IV x1 - Continue midodrine 15 mg TID -  Hold BB with recent discontinuation of inotrope - Hold RAASi, MRA, and SGLT2i with low BP requiring midodrine and poor renal function.   Plan: 1) Medication changes recommended at this time: - Agree with furosemide 60 mg IV x1 then oral furosemide tomorrow  2) Patient  assistance: - Patient has Medicare Advantage plan - Plan to discuss prescription access with patient prior to discharge  3)  Education  - To be completed prior to discharge  Wilmer Floor, PharmD PGY2 Cardiology Pharmacy Resident Heart Failure Stewardship Pharmacist Phone [phone]

## 2023-02-19 NOTE — Plan of Care (Signed)

## 2023-02-19 NOTE — Progress Notes (Signed)
Physical Therapy Treatment Patient Details Name: Justin Fleming MRN: [id number] DOB: 1960/08/21 Today's Date: 02/19/2023   History of Present Illness 63 y.o. male presented 02/08/23 with tachycardia, febrile, upper respiratory symptoms. +Coronavirus (not COVID)  Afib with RVR. Underwent R heart cath 02/17/23   PMH significant of a-fib, CHF, gout, GERD, alcohol abuse.    PT Comments  Session focused on therex and gait training to promote ambulation and LE strengthening. Pt displayed improvements in bed mobility, transfers, and ambulation by decreasing assistance required, increasing his completion speed, and initiation. During eccentric STS, pt displayed good eccentric and concentric control and was able to increase the amount of time for the eccentric portion of the intervention. During gait pt progressed from a step-to-pattern without cueing but required cueing for RW management and maintaining upright trunk posture. Pt continues to have difficulty with activity tolerance, functional mobility, and balance but has made marked progressions from his previous condition and may benefit from HHPT rather than intense rehab <3 hours if his progressions continue and his living situation is adequate (frequent assistance available). Will continue to follow acutely.    If plan is discharge home, recommend the following: A little help with walking and/or transfers;A little help with bathing/dressing/bathroom;Assistance with cooking/housework;Assist for transportation;Help with stairs or ramp for entrance   Can travel by private vehicle     Yes  Equipment Recommendations  None recommended by PT    Recommendations for Other Services       Precautions / Restrictions Precautions Precautions: Fall Recall of Precautions/Restrictions: Intact Precaution/Restrictions Comments: watch HR and SpO2 Restrictions Weight Bearing Restrictions Per Provider Order: No     Mobility  Bed Mobility Overal bed mobility:  Needs Assistance Bed Mobility: Supine to Sit, Sit to Supine     Supine to sit: Supervision, HOB elevated Sit to supine: Supervision   General bed mobility comments: Pt shows marked improvement in overall bed mobility by increasing speed to come to EOB and improved initiation. Didn't require the use of rails    Transfers Overall transfer level: Needs assistance Equipment used: Rolling walker (2 wheels) Transfers: Sit to/from Stand Sit to Stand: Contact guard assist, From elevated surface           General transfer comment: Pt showed marked improvement in transfer initation, control, and speed. Pt required less assistance but still benefitted from hand placement cueing    Ambulation/Gait Ambulation/Gait assistance: Contact guard assist, +2 safety/equipment Gait Distance (Feet): 84 Feet Assistive device: Rolling walker (2 wheels) Gait Pattern/deviations: Step-to pattern, Step-through pattern, Decreased stride length, Shuffle, Trunk flexed Gait velocity: reduced Gait velocity interpretation: <1.31 ft/sec, indicative of household Nurse, learning disability Bed    Modified Rankin (Stroke Patients Only)       Balance Overall balance assessment: Needs assistance Sitting-balance support: No upper extremity supported, Feet supported Sitting balance-Leahy Scale: Good Sitting balance - Comments: Pt sat EOB without UE support. No LOB. slight fwd trunk lean which he corrected with cueing but would soon revert back   Standing balance support: Bilateral upper extremity supported, No upper extremity supported, During functional activity Standing balance-Leahy Scale: Fair Standing balance comment: Pt stands without UE support during eccentric sit to stands and CGA for safety. No LOB             High level balance activites: Head turns, Turns High Level Balance Comments:  Pt moderately decreases gait speed with head turns and when  turning.            Communication Communication Communication: No apparent difficulties  Cognition Arousal: Alert Behavior During Therapy: WFL for tasks assessed/performed   PT - Cognitive impairments: No apparent impairments                         Following commands: Intact      Cueing Cueing Techniques: Verbal cues, Tactile cues  Exercises Other Exercises Other Exercises: Eccentric STS to bed 6x4"; CGA; (Pt performed 6 4-5 sec eccentric STS. No LOB. Height of bed mid thigh.)    General Comments General comments (skin integrity, edema, etc.): VSS throughout session      Pertinent Vitals/Pain Pain Assessment Pain Assessment: No/denies pain Pain Intervention(s): Monitored during session    Home Living                          Prior Function            PT Goals (current goals can now be found in the care plan section) Acute Rehab PT Goals Patient Stated Goal: Move easier PT Goal Formulation: With patient Time For Goal Achievement: 02/23/23 Potential to Achieve Goals: Good Progress towards PT goals: Progressing toward goals    Frequency    Min 1X/week      PT Plan      Co-evaluation              AM-PAC PT "6 Clicks" Mobility   Outcome Measure  Help needed turning from your back to your side while in a flat bed without using bedrails?: A Little Help needed moving from lying on your back to sitting on the side of a flat bed without using bedrails?: A Little Help needed moving to and from a bed to a chair (including a wheelchair)?: A Little Help needed standing up from a chair using your arms (e.g., wheelchair or bedside chair)?: A Little Help needed to walk in hospital room?: A Little Help needed climbing 3-5 steps with a railing? : A Lot 6 Click Score: 17    End of Session Equipment Utilized During Treatment: Gait belt Activity Tolerance: Patient tolerated treatment well Patient left: in bed;with call bell/phone within  reach;with bed alarm set   PT Visit Diagnosis: Unsteadiness on feet (R26.81);Other abnormalities of gait and mobility (R26.89);Muscle weakness (generalized) (M62.81);Difficulty in walking, not elsewhere classified (R26.2) Pain - Right/Left:  (Bil) Pain - part of body: Leg     Time: 1610-9604 PT Time Calculation (min) (ACUTE ONLY): 27 min  Charges:    $Gait Training: 8-22 mins $Therapeutic Exercise: 8-22 mins PT General Charges $$ ACUTE PT VISIT: 1 Visit                    [address], SPT   [place] 02/19/2023, 5:58 PM

## 2023-02-19 NOTE — Care Management Important Message (Signed)
Important Message  Patient Details  Name: Justin Fleming MRN: [id number] Date of Birth: 06-22-1960   Important Message Given:  Yes - Medicare IM     Renie Ora 02/19/2023, 11:28 AM

## 2023-02-19 NOTE — Plan of Care (Signed)
  Problem: Education: Goal: Knowledge of General Education information will improve Description: Including pain rating scale, medication(s)/side effects and non-pharmacologic comfort measures Outcome: Progressing   Problem: Health Behavior/Discharge Planning: Goal: Ability to manage health-related needs will improve Outcome: Progressing   Problem: Clinical Measurements: Goal: Ability to maintain clinical measurements within normal limits will improve Outcome: Progressing Goal: Will remain free from infection Outcome: Progressing Goal: Diagnostic test results will improve Outcome: Progressing Goal: Respiratory complications will improve Outcome: Progressing Goal: Cardiovascular complication will be avoided Outcome: Progressing   Problem: Activity: Goal: Risk for activity intolerance will decrease Outcome: Progressing   Problem: Nutrition: Goal: Adequate nutrition will be maintained Outcome: Progressing   Problem: Coping: Goal: Level of anxiety will decrease Outcome: Progressing   Problem: Elimination: Goal: Will not experience complications related to bowel motility Outcome: Progressing Goal: Will not experience complications related to urinary retention Outcome: Progressing   Problem: Pain Managment: Goal: General experience of comfort will improve and/or be controlled Outcome: Progressing   Problem: Safety: Goal: Ability to remain free from injury will improve Outcome: Progressing   Problem: Skin Integrity: Goal: Risk for impaired skin integrity will decrease Outcome: Progressing   Problem: Education: Goal: Ability to demonstrate management of disease process will improve Outcome: Progressing Goal: Ability to verbalize understanding of medication therapies will improve Outcome: Progressing Goal: Individualized Educational Video(s) Outcome: Progressing   Problem: Activity: Goal: Capacity to carry out activities will improve Outcome: Progressing    Problem: Cardiac: Goal: Ability to achieve and maintain adequate cardiopulmonary perfusion will improve Outcome: Progressing   Problem: Education: Goal: Knowledge of disease or condition will improve Outcome: Progressing Goal: Understanding of medication regimen will improve Outcome: Progressing Goal: Individualized Educational Video(s) Outcome: Progressing   Problem: Activity: Goal: Ability to tolerate increased activity will improve Outcome: Progressing   Problem: Cardiac: Goal: Ability to achieve and maintain adequate cardiopulmonary perfusion will improve Outcome: Progressing   Problem: Health Behavior/Discharge Planning: Goal: Ability to safely manage health-related needs after discharge will improve Outcome: Progressing   Problem: Education: Goal: Understanding of CV disease, CV risk reduction, and recovery process will improve Outcome: Progressing Goal: Individualized Educational Video(s) Outcome: Progressing   Problem: Activity: Goal: Ability to return to baseline activity level will improve Outcome: Progressing   Problem: Cardiovascular: Goal: Ability to achieve and maintain adequate cardiovascular perfusion will improve Outcome: Progressing Goal: Vascular access site(s) Level 0-1 will be maintained Outcome: Progressing   Problem: Health Behavior/Discharge Planning: Goal: Ability to safely manage health-related needs after discharge will improve Outcome: Progressing   Problem: Education: Goal: Understanding of CV disease, CV risk reduction, and recovery process will improve Outcome: Progressing Goal: Individualized Educational Video(s) Outcome: Progressing   Problem: Activity: Goal: Ability to return to baseline activity level will improve Outcome: Progressing   Problem: Cardiovascular: Goal: Ability to achieve and maintain adequate cardiovascular perfusion will improve Outcome: Progressing Goal: Vascular access site(s) Level 0-1 will be  maintained Outcome: Progressing   Problem: Health Behavior/Discharge Planning: Goal: Ability to safely manage health-related needs after discharge will improve Outcome: Progressing

## 2023-02-19 NOTE — Progress Notes (Addendum)
 PROGRESS NOTE    Justin Fleming  WJX:[id number] DOB: March 07, 1960 DOA: 02/08/2023 PCP: [name], FNP   Brief Narrative:  The patient is a 63 year old AAM with a past medical history significant for atrial fibrillation, CHF, gout, GERD, history of alcohol abuse and other comorbidities who continues to drink significant alcohol.  Is admitted with A-fib with RVR and acute on chronic systolic congestive heart failure. He also was noted to have worsening renal function.  Details of hospitalization as below.  Assessment & Plan:   Principal Problem:   Atrial fibrillation with RVR (HCC) Active Problems:   Alcohol abuse   Elevated troponin   Atrial fibrillation with rapid ventricular response (HCC)   Chronic anemia   Acute on chronic diastolic CHF (congestive heart failure) (HCC)   Alcohol induced fatty liver   URI (upper respiratory infection)  Atrial Fibrillation with RVR (HCC) s/p TEE/DCCV and conversion to NSR -TSH was 2.743. Patient was on amiodarone drip and transitioned to oral amiodarone as below however it has been changed patient back to amiodarone drip given that he was started on milrinone but milrinone was stopped and he was transitioned to oral amiodarone again on 02/17/2023.  Patient not on beta-blocker due to hypotension. 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 -Status post TEE/DCCV 02/11/23, Findings on TEE showed "Moderate reduced LV function, Mild MR,+PFO,Negative study for LAA thrombus".  Maintaining sinus rhythm.  Remains on p.o. amiodarone and Eliquis.  No beta-blocker due to hypotension and requiring midodrine.  Management per cardiology.     Acute respiratory failure with hypoxia secondary to acute on Chronic Systolic CHF/Cardiomyopathy/hypotension: -Echocardiogram done and showed an LVEF of 30 to 35% with global hypokinesis, moderately reduced right ventricular function, moderate biatrial enlargement and mild to moderate MR; this EF is  down from 50 to 55% back in December Patient was diuresed with IV Lasix, started on midodrine to support blood pressures and cardiology feel that his GDMT is limited otherwise. PICC line was placed on 2/7/ and he was started on IV Milrinone on to assist with IV diuresis given low CO-OX. Milrinone was weaned off on 2/12. Repeat CO-OX on 2/13 had dropped from 92 to 56. Therefore, he underwent RHC to help better assess volume status and cardiac output - this showed normal to mildly elevated pre and post capillary filling pressure and normal cardiac output/ index. Net negative 10.4 L this admission. Creatinine up from 1.13 yesterday to 1.45 today. CO-OX 99 today. - Bump in creatinine may be due to renal congestion given mildly elevated filling pressures on RHC yesterday (PCWP was 17 mmHg).  Patient getting additional dose of Lasix today and cardiology plans to transition to p.o. Lasix tomorrow.  Per cardiology, patient will benefit from [facility] at some point.   Alcohol Abuse, Continuous -Counseled to quit alcohol.  No signs of withdrawal this admission.   Chronic Normocytic Anemia, stable/history of GI bleed FOBT was positive during last admission in 12/2022. EGD showed gastritis but no signs of bleeding. Baseline hemoglobin appears to be between 8-10.  Hemoglobin 8.6 today.  Will continue to monitor and target to transfuse if less than 7.5 due to acute CHF and hypoxia.  Continue Protonix.  Hypokalemia Resolved   Hypomagnesemia Resolved   AKI vs CKD stage IIIa ruled out.  Just clarifying that previous to this hospitalization, patient's baseline creatinine appears to be between 1.07-1.23.  However during this hospitalization patient's creatinine peaked at 1.49, that was on day of admission.  Patient does not carry  any history of CKD based on previous creatinine.  Creatinine improved to 1.15 but jumped to 1.45 today..  Based on this, patient had AKI only.   Hyponatremia: Fluctuating, 129 today.  Monitor  daily.   Elevated Troponin -In the setting of tachycardia, demand ischemia. -Troponin went from 68 -> 67 -Continue to monitor  -Cardiology is following   Alcohol Induced Fatty Liver -Refer to GI team on discharge.   URI (Upper Respiratory Infection) with Coronavirus OC43 (Not COVID)/community-acquired pneumonia -Respiratory panel significant for Coronavirus OC43.  Blood culture negative.  Chest x-ray from 02/16/2023 shows left middle and lower lobe infiltrates, consistent with pneumonia.  Patient has been started on Rocephin and doxycycline which I will continue for total of 5 days.  Patient did spike fever of 102.2 at 4 PM on 02/16/2023.  Afebrile ever since.  Tobacco Abuse I have discussed tobacco cessation with the patient.  I have counseled the patient regarding the negative impacts of continued tobacco use including but not limited to lung cancer, COPD, and cardiovascular disease.  I have discussed alternatives to tobacco and modalities that may help facilitate tobacco cessation including but not limited to biofeedback, hypnosis, and medications.  Total time spent with tobacco counseling was 5 minutes.  DVT prophylaxis: Eliquis   Code Status: Full Code  Family Communication:  None present at bedside.  Plan of care discussed with patient in length and he/she verbalized understanding and agreed with it.  Status is: Inpatient Remains inpatient appropriate because: Rising creatinine, will be discharged when cleared by cardiology.  Per TOC, insurance authorization and bed has been approved and authorization is good through 02/23/2023.   Estimated body mass index is 25.62 kg/m as calculated from the following:   Height as of this encounter: 5\' 10"  (1.778 m).   Weight as of this encounter: 81 kg.    Nutritional Assessment: Body mass index is 25.62 kg/m.Marland Kitchen Seen by dietician.  I agree with the assessment and plan as outlined below: Nutrition Status:        . Skin Assessment: I have  examined the patient's skin and I agree with the wound assessment as performed by the wound care RN as outlined below:    Consultants:  Cardiology  Procedures:  As above  Antimicrobials:  Anti-infectives (From admission, onward)    Start     Dose/Rate Route Frequency Ordered Stop   02/16/23 2200  doxycycline (VIBRA-TABS) tablet 100 mg        100 mg Oral Every 12 hours 02/16/23 1359     02/15/23 1915  cefTRIAXone (ROCEPHIN) 1 g in sodium chloride 0.9 % 100 mL IVPB        1 g 200 mL/hr over 30 Minutes Intravenous Every 24 hours 02/15/23 1828     02/15/23 1915  azithromycin (ZITHROMAX) 500 mg in sodium chloride 0.9 % 250 mL IVPB  Status:  Discontinued        500 mg 250 mL/hr over 60 Minutes Intravenous Every 24 hours 02/15/23 1828 02/16/23 1359         Subjective: Seen and examined.  He has no complaints.  Objective: Vitals:   02/18/23 2335 02/19/23 0310 02/19/23 0500 02/19/23 0700  BP: 104/76 102/85  92/65  Pulse: 77 71  66  Resp: 18 18  17   Temp: 98.9 F (37.2 C) 99 F (37.2 C)  97.7 F (36.5 C)  TempSrc: Oral Oral  Oral  SpO2: 99% 99%  100%  Weight:   81 kg   Height:  Intake/Output Summary (Last 24 hours) at 02/19/2023 1239 Last data filed at 02/19/2023 0917 Gross per 24 hour  Intake 1197 ml  Output 1800 ml  Net -603 ml   Filed Weights   02/17/23 0319 02/18/23 0317 02/19/23 0500  Weight: 83.9 kg 83.2 kg 81 kg    Examination:  General exam: Appears calm and comfortable  Respiratory system: Clear to auscultation. Respiratory effort normal. Cardiovascular system: S1 & S2 heard, RRR. No JVD, murmurs, rubs, gallops or clicks.  Trace pitting edema bilateral lower extremity. Gastrointestinal system: Abdomen is nondistended, soft and nontender. No organomegaly or masses felt. Normal bowel sounds heard. Central nervous system: Alert and oriented. No focal neurological deficits. Extremities: Symmetric 5 x 5 power. Skin: No rashes, lesions or ulcers.   Psychiatry: Judgement and insight appear normal. Mood & affect appropriate.   Data Reviewed: I have personally reviewed following labs and imaging studies  CBC: Recent Labs  Lab 02/13/23 0537 02/14/23 0622 02/15/23 0409 02/16/23 0420 02/17/23 0528 02/18/23 0927 02/18/23 1137  WBC 7.7 8.6 10.3 12.3* 11.1* 9.7  --   NEUTROABS 5.8 6.3 7.6 8.8*  --   --   --   HGB 8.1* 8.3* 8.3* 8.1* 7.7* 8.6* 9.9*  9.5*  HCT 24.8* 25.5* 25.7* 25.4* 24.5* 27.4* 29.0*  28.0*  MCV 94.7 93.8 93.5 93.4 93.9 92.9  --   PLT 218 257 321 409* 442* 522*  --    Basic Metabolic Panel: Recent Labs  Lab 02/13/23 0537 02/14/23 0622 02/15/23 0409 02/16/23 0420 02/17/23 0528 02/18/23 0927 02/18/23 1137 02/19/23 0605  NA 137 135 133* 136 129* 132* 133*  133* 131*  K 3.2* 3.8 4.0 3.6 4.1 4.3 4.4  4.4 4.0  CL 94* 91* 83* 85* 80* 91*  --  89*  CO2 32 33* 34* 36* 33* 34*  --  32  GLUCOSE 120* 112* 157* 112* 269* 94  --  100*  BUN 23 21 19 21 18 19   --  26*  CREATININE 1.41* 1.30* 1.61* 1.35* 1.15 1.13  --  1.45*  CALCIUM 8.3* 8.2* 8.7* 8.6* 8.6* 8.3*  --  8.3*  MG 1.6* 1.7 1.7 2.2 2.0  --   --   --   PHOS 3.3 3.0 3.2 4.1  --   --   --   --    GFR: Estimated Creatinine Clearance: 52.4 mL/min (A) (by C-G formula based on SCr of 1.45 mg/dL (H)). Liver Function Tests: Recent Labs  Lab 02/15/23 0409 02/16/23 0420 02/17/23 0528 02/18/23 0927 02/19/23 0605  AST 21 23 23 26 27   ALT 11 11 10 12 14   ALKPHOS 54 52 52 53 54  BILITOT 1.3* 1.1 1.0 1.2 1.0  PROT 5.8* 5.8* 6.1* 6.3* 6.4*  ALBUMIN 2.2* 2.1* 2.1* 2.2* 2.2*   No results for input(s): "LIPASE", "AMYLASE" in the last 168 hours. No results for input(s): "AMMONIA" in the last 168 hours. Coagulation Profile: No results for input(s): "INR", "PROTIME" in the last 168 hours. Cardiac Enzymes: No results for input(s): "CKTOTAL", "CKMB", "CKMBINDEX", "TROPONINI" in the last 168 hours. BNP (last 3 results) No results for input(s): "PROBNP" in the  last 8760 hours. HbA1C: No results for input(s): "HGBA1C" in the last 72 hours. CBG: No results for input(s): "GLUCAP" in the last 168 hours. Lipid Profile: No results for input(s): "CHOL", "HDL", "LDLCALC", "TRIG", "CHOLHDL", "LDLDIRECT" in the last 72 hours. Thyroid Function Tests: No results for input(s): "TSH", "T4TOTAL", "FREET4", "T3FREE", "THYROIDAB" in the last 72 hours.  Anemia Panel: No results for input(s): "VITAMINB12", "FOLATE", "FERRITIN", "TIBC", "IRON", "RETICCTPCT" in the last 72 hours. Sepsis Labs: Recent Labs  Lab 02/16/23 0935 02/16/23 1200 02/17/23 0528  PROCALCITON  --   --  0.48  LATICACIDVEN 0.9 1.6  --     Recent Results (from the past 240 hours)  MRSA Next Gen by PCR, Nasal     Status: None   Collection Time: 02/17/23  9:30 AM   Specimen: Nasal Mucosa; Nasal Swab  Result Value Ref Range Status   MRSA by PCR Next Gen NOT DETECTED NOT DETECTED Final    Comment: (NOTE) The GeneXpert MRSA Assay (FDA approved for NASAL specimens only), is one component of a comprehensive MRSA colonization surveillance program. It is not intended to diagnose MRSA infection nor to guide or monitor treatment for MRSA infections. Test performance is not FDA approved in patients less than 67 years old. Performed at [facility], [address], [place], [place] [address]      Radiology Studies: CARDIAC CATHETERIZATION Result Date: 02/18/2023 HEMODYNAMICS: RA:   6 mmHg (mean) RV:   45/6-10 mmHg PA:   45/18 mmHg (29 mean) PCWP:  17 mmHg (mean)    Estimated Fick CO/CI   6 L/min, 3 L/min/m2 Thermodilution CO/CI   5.6 L/min, 2.8 L/min/m2    TPG    12  mmHg     PVR     2-2.1 Wood Units PAPi      4.5  IMPRESSION: Normal to mildly elevated pre and post capillary filling pressures Normal cardiac output & index Aditya Sabharwal 11:46 AM    Scheduled Meds:  amiodarone  200 mg Oral BID   Followed by   Melene Muller ON 02/24/2023] amiodarone  200 mg Oral Daily   apixaban  5 mg Oral BID    diclofenac Sodium  2 g Topical QID   doxycycline  100 mg Oral Q12H   feeding supplement  237 mL Oral BID BM   furosemide  60 mg Intravenous Once   guaiFENesin  1,200 mg Oral BID   midodrine  15 mg Oral TID WC   pantoprazole  40 mg Oral BID   sodium chloride flush  10-40 mL Intracatheter Q12H   sodium chloride flush  3 mL Intravenous Q12H   thiamine  100 mg Oral Daily   Continuous Infusions:  cefTRIAXone (ROCEPHIN)  IV 1 g (02/18/23 2108)     LOS: 10 days   Hughie Closs, MD [facility]  02/19/2023, 12:39 PM   *Please note that this is a verbal dictation therefore any spelling or grammatical errors are due to the "Dragon Medical One" system interpretation.  Please page via Amion and do not message via secure chat for urgent patient care matters. Secure chat can be used for non urgent patient care matters.  How to contact the [facility] Attending or Consulting provider 7A - 7P or covering provider during after hours 7P -7A, for this patient?  Check the care team in [facility] and look for a) attending/consulting [facility] provider listed and b) the [facility] team listed. Page or secure chat 7A-7P. Log into [email or web address] and use [facility] universal password to access. If you do not have the password, please contact the hospital operator. Locate the [facility] provider you are looking for under [facility] and page to a number that you can be directly reached. If you still have difficulty reaching the provider, please page the [facility] (Director on Call) for the Hospitalists listed on amion for  assistance.

## 2023-02-20 DIAGNOSIS — I4891 Unspecified atrial fibrillation: Secondary | ICD-10-CM | POA: Diagnosis not present

## 2023-02-20 LAB — COMPREHENSIVE METABOLIC PANEL
ALT: 15 U/L (ref 0–44)
AST: 29 U/L (ref 15–41)
Albumin: 2.3 g/dL — ABNORMAL LOW (ref 3.5–5.0)
Alkaline Phosphatase: 57 U/L (ref 38–126)
Anion gap: 11 (ref 5–15)
BUN: 30 mg/dL — ABNORMAL HIGH (ref 8–23)
CO2: 31 mmol/L (ref 22–32)
Calcium: 8.5 mg/dL — ABNORMAL LOW (ref 8.9–10.3)
Chloride: 90 mmol/L — ABNORMAL LOW (ref 98–111)
Creatinine, Ser: 1.31 mg/dL — ABNORMAL HIGH (ref 0.61–1.24)
GFR, Estimated: >60 mL/min (ref >60)
Glucose, Bld: 105 mg/dL — ABNORMAL HIGH (ref 70–99)
Potassium: 4.3 mmol/L (ref 3.5–5.1)
Sodium: 132 mmol/L — ABNORMAL LOW (ref 135–145)
Total Bilirubin: 0.6 mg/dL (ref 0.0–1.2)
Total Protein: 6.4 g/dL — ABNORMAL LOW (ref 6.5–8.1)

## 2023-02-20 LAB — CBC WITH DIFFERENTIAL/PLATELET
Abs Immature Granulocytes: 0.09 10*3/uL — ABNORMAL HIGH (ref 0.00–0.07)
Basophils Absolute: 0.1 10*3/uL (ref 0.0–0.1)
Basophils Relative: 1 %
Eosinophils Absolute: 0.2 10*3/uL (ref 0.0–0.5)
Eosinophils Relative: 2 %
HCT: 25.1 % — ABNORMAL LOW (ref 39.0–52.0)
Hemoglobin: 7.9 g/dL — ABNORMAL LOW (ref 13.0–17.0)
Immature Granulocytes: 1 %
Lymphocytes Relative: 8 %
Lymphs Abs: 0.8 10*3/uL (ref 0.7–4.0)
MCH: 28.8 pg (ref 26.0–34.0)
MCHC: 31.5 g/dL (ref 30.0–36.0)
MCV: 91.6 fL (ref 80.0–100.0)
Monocytes Absolute: 0.8 10*3/uL (ref 0.1–1.0)
Monocytes Relative: 8 %
Neutro Abs: 8.6 10*3/uL — ABNORMAL HIGH (ref 1.7–7.7)
Neutrophils Relative %: 80 %
Platelets: 569 10*3/uL — ABNORMAL HIGH (ref 150–400)
RBC: 2.74 MIL/uL — ABNORMAL LOW (ref 4.22–5.81)
RDW: 18 % — ABNORMAL HIGH (ref 11.5–15.5)
WBC: 10.5 10*3/uL (ref 4.0–10.5)
nRBC: 0 % (ref 0.0–0.2)

## 2023-02-20 LAB — COOXEMETRY PANEL
Carboxyhemoglobin: 2.3 % — ABNORMAL HIGH (ref 0.5–1.5)
Methemoglobin: <0.7 % (ref 0.0–1.5)
O2 Saturation: 68.1 %
Total hemoglobin: 9.4 g/dL — ABNORMAL LOW (ref 12.0–16.0)

## 2023-02-20 MED ORDER — FUROSEMIDE 10 MG/ML IJ SOLN
60.0000 mg | Freq: Once | INTRAMUSCULAR | Status: AC
  Administered 2023-02-20: 60 mg via INTRAVENOUS
  Filled 2023-02-20: qty 6

## 2023-02-20 NOTE — Progress Notes (Signed)
 Rounding Note    Patient Name: Justin Fleming Date of Encounter: 02/20/2023  [facility] Cardiologist: Nona Dell, MD   Subjective   No acute overnight events.  Legs do not feel as heavy today, and overall he appears to be in brighter spirits. He anticipates going to a rehab facility on discharge. Inpatient Medications    Scheduled Meds:  amiodarone  200 mg Oral BID   Followed by   Melene Muller ON 02/24/2023] amiodarone  200 mg Oral Daily   apixaban  5 mg Oral BID   diclofenac Sodium  2 g Topical QID   feeding supplement  237 mL Oral BID BM   guaiFENesin  1,200 mg Oral BID   midodrine  15 mg Oral TID WC   pantoprazole  40 mg Oral BID   sodium chloride flush  10-40 mL Intracatheter Q12H   sodium chloride flush  3 mL Intravenous Q12H   thiamine  100 mg Oral Daily   Continuous Infusions:   PRN Meds: acetaminophen **OR** acetaminophen, HYDROcodone-acetaminophen, levalbuterol, ondansetron **OR** ondansetron (ZOFRAN) IV, sodium chloride flush, sodium chloride flush   Vital Signs    Vitals:   02/20/23 0343 02/20/23 0359 02/20/23 0752 02/20/23 1147  BP: (!) 118/95  103/84 106/81  Pulse: 80     Resp: 20  18 18   Temp: 98.2 F (36.8 C)  98.2 F (36.8 C) 98.2 F (36.8 C)  TempSrc: Oral  Oral Oral  SpO2: 96%  93%   Weight:  78.9 kg    Height:        Intake/Output Summary (Last 24 hours) at 02/20/2023 1325 Last data filed at 02/20/2023 1147 Gross per 24 hour  Intake 1399 ml  Output 2350 ml  Net -951 ml      02/20/2023    3:59 AM 02/19/2023    5:00 AM 02/18/2023    3:17 AM  Last 3 Weights  Weight (lbs) 174 lb 178 lb 9.2 oz 183 lb 6.4 oz  Weight (kg) 78.926 kg 81 kg 83.19 kg      Telemetry    Normal sinus rhythm with rates in the 70s. - Personally Reviewed  ECG    No new ECG tracing today. - Personally Reviewed  Physical Exam   GEN: No acute distress.   Neck: No JVD. Cardiac: RRR. No murmurs, rubs, or gallops.  Respiratory: No increased work  of breathing. Decreased breath sounds in bilateral bases. No wheezes, rhonchi, or rales appreciated. MS: Trace lower extremity edema bilaterally; No deformity. Skin: Warm and dry. Neuro:  No focal deficits. Psych: Normal affect. Responds appropriately.  Labs    High Sensitivity Troponin:   Recent Labs  Lab 02/08/23 1639 02/08/23 1844 02/08/23 2304 02/09/23 0222  TROPONINIHS 49* 53* 68* 67*     Chemistry Recent Labs  Lab 02/15/23 0409 02/16/23 0420 02/17/23 0528 02/18/23 0927 02/18/23 1137 02/19/23 0605 02/20/23 0355  NA 133* 136 129* 132* 133*  133* 131* 132*  K 4.0 3.6 4.1 4.3 4.4  4.4 4.0 4.3  CL 83* 85* 80* 91*  --  89* 90*  CO2 34* 36* 33* 34*  --  32 31  GLUCOSE 157* 112* 269* 94  --  100* 105*  BUN 19 21 18 19   --  26* 30*  CREATININE 1.61* 1.35* 1.15 1.13  --  1.45* 1.31*  CALCIUM 8.7* 8.6* 8.6* 8.3*  --  8.3* 8.5*  MG 1.7 2.2 2.0  --   --   --   --  PROT 5.8* 5.8* 6.1* 6.3*  --  6.4* 6.4*  ALBUMIN 2.2* 2.1* 2.1* 2.2*  --  2.2* 2.3*  AST 21 23 23 26   --  27 29  ALT 11 11 10 12   --  14 15  ALKPHOS 54 52 52 53  --  54 57  BILITOT 1.3* 1.1 1.0 1.2  --  1.0 0.6  GFRNONAA 47* 58* >60 >60  --  53* >60  ANIONGAP 16* 15 16* 7  --  10 11    Lipids No results for input(s): "CHOL", "TRIG", "HDL", "LABVLDL", "LDLCALC", "CHOLHDL" in the last 168 hours.  Hematology Recent Labs  Lab 02/17/23 0528 02/18/23 0927 02/18/23 1137 02/20/23 1208  WBC 11.1* 9.7  --  10.5  RBC 2.61* 2.95*  --  2.74*  HGB 7.7* 8.6* 9.9*  9.5* 7.9*  HCT 24.5* 27.4* 29.0*  28.0* 25.1*  MCV 93.9 92.9  --  91.6  MCH 29.5 29.2  --  28.8  MCHC 31.4 31.4  --  31.5  RDW 18.4* 18.2*  --  18.0*  PLT 442* 522*  --  569*   Thyroid No results for input(s): "TSH", "FREET4" in the last 168 hours.  BNP Recent Labs  Lab 02/15/23 0409 02/16/23 0500  BNP 964.6* 943.3*    DDimer No results for input(s): "DDIMER" in the last 168 hours.   Radiology    No results found.   Cardiac Studies    TTE 02/09/2023: Impressions: 1. Left ventricular ejection fraction, by estimation, is 30 to 35%. The  left ventricle has moderately decreased function. The left ventricle  demonstrates global hypokinesis. Left ventricular diastolic function could  not be evaluated.   2. Right ventricular systolic function is moderately reduced. The right  ventricular size is moderately enlarged. Tricuspid regurgitation signal is  inadequate for assessing PA pressure.   3. Left atrial size was moderately dilated.   4. Right atrial size was moderately dilated.   5. The mitral valve is normal in structure. Mild to moderate mitral valve  regurgitation. No evidence of mitral stenosis.   6. The aortic valve is tricuspid. Aortic valve regurgitation is not  visualized. No aortic stenosis is present.  _______________   TEE/ DCCV 02/11/2023: Impressions: 1. Left ventricular ejection fraction, by estimation, is 30 to 35%. The  left ventricle has moderately decreased function.   2. No left atrial/left atrial appendage thrombus was detected.   3. Mild mitral valve regurgitation.   4. Tricuspid valve regurgitation is mild to moderate.   5. Aortic valve regurgitation is trivial.   6. There is a moderately sized patent foramen ovale with predominantly  right to left shunting across the atrial septum.   Conclusion(s)/Recommendation(s): No LA/LAA thrombus identified. Negative  bubble study for interatrial shunt. No intracardiac source of embolism  detected on this on this transesophageal echocardiogram.   Patient Profile     63 y.o. male a history of persistent atrial fibrillation (on Amiodarone, Digoxin, and Eliquis), chronic HFpEF, hypotension on Midodrine, GERD and known gastritis with recent GI bleed in 01/2023, anxiety/ depression, and alcohol abuse who was admitted on 02/08/2023 with atrial fibrillation with RVR and acute on chronic CHF. Echo this admission showed a drop in EF to 30-35%.   Assessment & Plan     Acute on chronic HFrEF AKI on CKD stage III -Patient needs an ischemic evaluation but has recently been cardioverted and there is hesitation to hold Eliquis for procedure. -Right heart cath reviewed .  Recall patient  was on milrinone for several days with very adequate diuresis.  Creatinine improved with diuresis yesterday and patient appears brighter today, would redosed Lasix x 1, 60 mg IV, I have ordered. -Right heart cath data suggests normal cardiac output therefore continued milrinone is not indicated.  Co. ox drop may have been misleading. -If he can lay flat by Monday, may want to consider cardiac MRI to further understand the nature of his cardiomyopathy which may be tachycardia mediated but left heart cath is still pending.  No urgency to cardiac MRI, can complete if patient remains in hospital and has not discharged to rehab facility.  Can also be completed as an outpatient.  Atrial fibrillation now status post cardioversion -Continue Eliquis 5 mg twice daily.  He is also on amiodarone 200 mg twice daily to complete loading and then will transition to 200 mg daily.  Given that he had a respiratory infection and pneumonia, short course of amiodarone may be in his best interest to maintain sinus rhythm which he is doing right now.  Hypotension -Patient is hypotensive and on midodrine, this does not seem to be from low output heart failure.  Blood pressure slightly better today.  Chronic Anemia History of GI Bleed FOBT was positive during last admission in 12/2022. EGD showed gastritis but no signs of bleeding.  - Hemoglobin has dropped today to 7.9, management per primary service but consider transfusion with heart failure. - Continue Protonix 40mg  daily. - Management per primary team.   Acute on CKD Stage III Baseline creatinine around 1.1 to 1.2. Creatinine has fluctuated upon and down this admission. - Creatinine is up from 1.13 yesterday to 1.45 , now 1.31, it improved with IV  diuresis so we will redose today. - Continue to monitor closely.    Otherwise, per primary team: - URI (coronaravirus OC43) - CAP - Alcohol abuse - Elevated Total Bili  - Alcohol induced fatty liver - Hypoalbuminemia - Hyponatremia  For questions or updates, please contact [facility] Please consult [email or web address] for contact info under        Signed, Parke Poisson, MD  02/20/2023, 1:25 PM

## 2023-02-20 NOTE — Progress Notes (Signed)
 PROGRESS NOTE    Justin Fleming  WUJ:[id number] DOB: January 22, 1960 DOA: 02/08/2023 PCP: [name], FNP   Brief Narrative:  The patient is a 63 year old AAM with a past medical history significant for atrial fibrillation, CHF, gout, GERD, history of alcohol abuse and other comorbidities who continues to drink significant alcohol.  Is admitted with A-fib with RVR and acute on chronic systolic congestive heart failure. He also was noted to have worsening renal function.  Details of hospitalization as below.  Assessment & Plan:   Principal Problem:   Atrial fibrillation with RVR (HCC) Active Problems:   Alcohol abuse   Elevated troponin   Atrial fibrillation with rapid ventricular response (HCC)   Chronic anemia   Acute on chronic diastolic CHF (congestive heart failure) (HCC)   Alcohol induced fatty liver   URI (upper respiratory infection)  Atrial Fibrillation with RVR (HCC) s/p TEE/DCCV and conversion to NSR -TSH was 2.743. Patient was on amiodarone drip and transitioned to oral amiodarone as below however it has been changed patient back to amiodarone drip given that he was started on milrinone but milrinone was stopped and he was transitioned to oral amiodarone again on 02/17/2023.  Patient not on beta-blocker due to hypotension. 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 -Status post TEE/DCCV 02/11/23, Findings on TEE showed "Moderate reduced LV function, Mild MR,+PFO,Negative study for LAA thrombus".  Maintaining sinus rhythm.  Remains on p.o. amiodarone and Eliquis.  No beta-blocker due to hypotension and requiring midodrine.  Management per cardiology.     Acute respiratory failure with hypoxia secondary to acute on Chronic Systolic CHF/Cardiomyopathy/hypotension: -Echocardiogram done and showed an LVEF of 30 to 35% with global hypokinesis, moderately reduced right ventricular function, moderate biatrial enlargement and mild to moderate MR; this EF is  down from 50 to 55% back in December Patient was diuresed with IV Lasix, started on midodrine to support blood pressures and cardiology feel that his GDMT is limited otherwise. PICC line was placed on 2/7/ and he was started on IV Milrinone on to assist with IV diuresis given low CO-OX. Milrinone was weaned off on 2/12. Repeat CO-OX on 2/13 had dropped from 92 to 56. Therefore, he underwent RHC to help better assess volume status and cardiac output - this showed normal to mildly elevated pre and post capillary filling pressure and normal cardiac output/ index. Bump in creatinine 02/19/2023 may be due to renal congestion given mildly elevated filling pressures on RHC yesterday (PCWP was 17 mmHg).  Patient given additional dose of Lasix, further diuretics per cardiology.  Per cardiology, unable to perform LHC since anticoagulation cannot be stopped due to recent DCCV.  They plan to get cardiac MRI on Monday.   Alcohol Abuse, Continuous -Counseled to quit alcohol.  No signs of withdrawal this admission.   Chronic Normocytic Anemia, stable/history of GI bleed FOBT was positive during last admission in 12/2022. EGD showed gastritis but no signs of bleeding. Baseline hemoglobin appears to be between 8-10.  Hemoglobin 8.6 today.  Will continue to monitor and target to transfuse if less than 7.5 due to acute CHF and hypoxia.  Continue Protonix.  Hypokalemia Resolved   Hypomagnesemia Resolved   AKI vs CKD stage IIIa ruled out.  Just clarifying that previous to this hospitalization, patient's baseline creatinine appears to be between 1.07-1.23.  However during this hospitalization patient's creatinine peaked at 1.49, that was on day of admission.  Patient does not carry any history of CKD based on previous creatinine.  Creatinine improved to 1.15 but jumped to 1.45 02/19/2023, somewhat improved to 1.31 today.    Hyponatremia: Fluctuating, 129 today.  Monitor daily.   Elevated Troponin -In the setting of  tachycardia, demand ischemia. -Troponin went from 68 -> 67 -Continue to monitor  -Cardiology is following   Alcohol Induced Fatty Liver -Refer to GI team on discharge.   URI (Upper Respiratory Infection) with Coronavirus OC43 (Not COVID)/community-acquired pneumonia -Respiratory panel significant for Coronavirus OC43.  Blood culture negative.  Chest x-ray from 02/16/2023 shows left middle and lower lobe infiltrates, consistent with pneumonia.  Patient has been started on Rocephin and doxycycline and has received total of 6 days of antibiotics, since he is afebrile with normal leukocytosis, will stop antibiotics.  Patient did spike fever of 102.2 at 4 PM on 02/16/2023.  Afebrile since then.  Tobacco Abuse I have discussed tobacco cessation with the patient.  I have counseled the patient regarding the negative impacts of continued tobacco use including but not limited to lung cancer, COPD, and cardiovascular disease.  I have discussed alternatives to tobacco and modalities that may help facilitate tobacco cessation including but not limited to biofeedback, hypnosis, and medications.  Total time spent with tobacco counseling was 5 minutes.  DVT prophylaxis: Eliquis   Code Status: Full Code  Family Communication:  None present at bedside.  Plan of care discussed with patient in length and he/she verbalized understanding and agreed with it.  Status is: Inpatient Remains inpatient appropriate because: Per neurology, cardiac MRI on Monday.   Estimated body mass index is 24.97 kg/m as calculated from the following:   Height as of this encounter: 5\' 10"  (1.778 m).   Weight as of this encounter: 78.9 kg.    Nutritional Assessment: Body mass index is 24.97 kg/m.Marland Kitchen Seen by dietician.  I agree with the assessment and plan as outlined below: Nutrition Status:        . Skin Assessment: I have examined the patient's skin and I agree with the wound assessment as performed by the wound care RN as  outlined below:    Consultants:  Cardiology  Procedures:  As above  Antimicrobials:  Anti-infectives (From admission, onward)    Start     Dose/Rate Route Frequency Ordered Stop   02/16/23 2200  doxycycline (VIBRA-TABS) tablet 100 mg        100 mg Oral Every 12 hours 02/16/23 1359     02/15/23 1915  cefTRIAXone (ROCEPHIN) 1 g in sodium chloride 0.9 % 100 mL IVPB        1 g 200 mL/hr over 30 Minutes Intravenous Every 24 hours 02/15/23 1828     02/15/23 1915  azithromycin (ZITHROMAX) 500 mg in sodium chloride 0.9 % 250 mL IVPB  Status:  Discontinued        500 mg 250 mL/hr over 60 Minutes Intravenous Every 24 hours 02/15/23 1828 02/16/23 1359         Subjective: Seen and examined.  No complaints.  Objective: Vitals:   02/19/23 2331 02/20/23 0343 02/20/23 0359 02/20/23 0752  BP: 102/72 (!) 118/95  103/84  Pulse: 80 80    Resp: 16 20  18   Temp: 98 F (36.7 C) 98.2 F (36.8 C)  98.2 F (36.8 C)  TempSrc: Oral Oral  Oral  SpO2: 91% 96%  93%  Weight:   78.9 kg   Height:        Intake/Output Summary (Last 24 hours) at 02/20/2023 0753 Last data filed at 02/20/2023 0700  Gross per 24 hour  Intake 1814 ml  Output 2400 ml  Net -586 ml   Filed Weights   02/18/23 0317 02/19/23 0500 02/20/23 0359  Weight: 83.2 kg 81 kg 78.9 kg    Examination:  General exam: Appears calm and comfortable  Respiratory system: Clear to auscultation. Respiratory effort normal. Cardiovascular system: S1 & S2 heard, RRR. No JVD, murmurs, rubs, gallops or clicks.  Trace pitting edema bilateral lower extremity. Gastrointestinal system: Abdomen is nondistended, soft and nontender. No organomegaly or masses felt. Normal bowel sounds heard. Central nervous system: Alert and oriented. No focal neurological deficits. Extremities: Symmetric 5 x 5 power. Skin: No rashes, lesions or ulcers.  Psychiatry: Judgement and insight appear normal. Mood & affect appropriate.   Data Reviewed: I have personally  reviewed following labs and imaging studies  CBC: Recent Labs  Lab 02/14/23 0622 02/15/23 0409 02/16/23 0420 02/17/23 0528 02/18/23 0927 02/18/23 1137  WBC 8.6 10.3 12.3* 11.1* 9.7  --   NEUTROABS 6.3 7.6 8.8*  --   --   --   HGB 8.3* 8.3* 8.1* 7.7* 8.6* 9.9*  9.5*  HCT 25.5* 25.7* 25.4* 24.5* 27.4* 29.0*  28.0*  MCV 93.8 93.5 93.4 93.9 92.9  --   PLT 257 321 409* 442* 522*  --    Basic Metabolic Panel: Recent Labs  Lab 02/14/23 0622 02/15/23 0409 02/16/23 0420 02/17/23 0528 02/18/23 0927 02/18/23 1137 02/19/23 0605 02/20/23 0355  NA 135 133* 136 129* 132* 133*  133* 131* 132*  K 3.8 4.0 3.6 4.1 4.3 4.4  4.4 4.0 4.3  CL 91* 83* 85* 80* 91*  --  89* 90*  CO2 33* 34* 36* 33* 34*  --  32 31  GLUCOSE 112* 157* 112* 269* 94  --  100* 105*  BUN 21 19 21 18 19   --  26* 30*  CREATININE 1.30* 1.61* 1.35* 1.15 1.13  --  1.45* 1.31*  CALCIUM 8.2* 8.7* 8.6* 8.6* 8.3*  --  8.3* 8.5*  MG 1.7 1.7 2.2 2.0  --   --   --   --   PHOS 3.0 3.2 4.1  --   --   --   --   --    GFR: Estimated Creatinine Clearance: 58 mL/min (A) (by C-G formula based on SCr of 1.31 mg/dL (H)). Liver Function Tests: Recent Labs  Lab 02/16/23 0420 02/17/23 0528 02/18/23 0927 02/19/23 0605 02/20/23 0355  AST 23 23 26 27 29   ALT 11 10 12 14 15   ALKPHOS 52 52 53 54 57  BILITOT 1.1 1.0 1.2 1.0 0.6  PROT 5.8* 6.1* 6.3* 6.4* 6.4*  ALBUMIN 2.1* 2.1* 2.2* 2.2* 2.3*   No results for input(s): "LIPASE", "AMYLASE" in the last 168 hours. No results for input(s): "AMMONIA" in the last 168 hours. Coagulation Profile: No results for input(s): "INR", "PROTIME" in the last 168 hours. Cardiac Enzymes: No results for input(s): "CKTOTAL", "CKMB", "CKMBINDEX", "TROPONINI" in the last 168 hours. BNP (last 3 results) No results for input(s): "PROBNP" in the last 8760 hours. HbA1C: No results for input(s): "HGBA1C" in the last 72 hours. CBG: No results for input(s): "GLUCAP" in the last 168 hours. Lipid  Profile: No results for input(s): "CHOL", "HDL", "LDLCALC", "TRIG", "CHOLHDL", "LDLDIRECT" in the last 72 hours. Thyroid Function Tests: No results for input(s): "TSH", "T4TOTAL", "FREET4", "T3FREE", "THYROIDAB" in the last 72 hours. Anemia Panel: No results for input(s): "VITAMINB12", "FOLATE", "FERRITIN", "TIBC", "IRON", "RETICCTPCT" in the last 72 hours. Sepsis  Labs: Recent Labs  Lab 02/16/23 0935 02/16/23 1200 02/17/23 0528  PROCALCITON  --   --  0.48  LATICACIDVEN 0.9 1.6  --     Recent Results (from the past 240 hours)  MRSA Next Gen by PCR, Nasal     Status: None   Collection Time: 02/17/23  9:30 AM   Specimen: Nasal Mucosa; Nasal Swab  Result Value Ref Range Status   MRSA by PCR Next Gen NOT DETECTED NOT DETECTED Final    Comment: (NOTE) The GeneXpert MRSA Assay (FDA approved for NASAL specimens only), is one component of a comprehensive MRSA colonization surveillance program. It is not intended to diagnose MRSA infection nor to guide or monitor treatment for MRSA infections. Test performance is not FDA approved in patients less than 29 years old. Performed at [facility], [address] [place], [place] [address]      Radiology Studies: CARDIAC CATHETERIZATION Result Date: 02/18/2023 HEMODYNAMICS: RA:   6 mmHg (mean) RV:   45/6-10 mmHg PA:   45/18 mmHg (29 mean) PCWP:  17 mmHg (mean)    Estimated Fick CO/CI   6 L/min, 3 L/min/m2 Thermodilution CO/CI   5.6 L/min, 2.8 L/min/m2    TPG    12  mmHg     PVR     2-2.1 Wood Units PAPi      4.5  IMPRESSION: Normal to mildly elevated pre and post capillary filling pressures Normal cardiac output & index Aditya Sabharwal 11:46 AM    Scheduled Meds:  amiodarone  200 mg Oral BID   Followed by   Melene Muller ON 02/24/2023] amiodarone  200 mg Oral Daily   apixaban  5 mg Oral BID   diclofenac Sodium  2 g Topical QID   doxycycline  100 mg Oral Q12H   feeding supplement  237 mL Oral BID BM   guaiFENesin  1,200 mg Oral BID    midodrine  15 mg Oral TID WC   pantoprazole  40 mg Oral BID   sodium chloride flush  10-40 mL Intracatheter Q12H   sodium chloride flush  3 mL Intravenous Q12H   thiamine  100 mg Oral Daily   Continuous Infusions:  cefTRIAXone (ROCEPHIN)  IV Stopped (02/20/23 0701)     LOS: 11 days   Hughie Closs, MD [facility]  02/20/2023, 7:53 AM   *Please note that this is a verbal dictation therefore any spelling or grammatical errors are due to the "Dragon [employer] One" system interpretation.  Please page via Amion and do not message via secure chat for urgent patient care matters. Secure chat can be used for non urgent patient care matters.  How to contact the [facility] Attending or Consulting provider 7A - 7P or covering provider during after hours 7P -7A, for this patient?  Check the care team in [facility] and look for a) attending/consulting [facility] provider listed and b) the [facility] team listed. Page or secure chat 7A-7P. Log into [email or web address] and use [place]'s universal password to access. If you do not have the password, please contact the hospital operator. Locate the [facility] provider you are looking for under [facility] and page to a number that you can be directly reached. If you still have difficulty reaching the provider, please page the [facility] (Director on Call) for the Hospitalists listed on amion for assistance.

## 2023-02-20 NOTE — Progress Notes (Signed)
 Physical Therapy Treatment Patient Details Name: Justin Fleming MRN: [id number] DOB: 04-29-1960 Today's Date: 02/20/2023   History of Present Illness 63 y.o. male presented 02/08/23 with tachycardia, febrile, upper respiratory symptoms. +Coronavirus (not COVID)  Afib with RVR. Underwent R heart cath 02/17/23   PMH significant of a-fib, CHF, gout, GERD, alcohol abuse.    PT Comments  Session focused on therex and gait training to promote further strengthening prior to d/c. Pt continues to show improvement with LE strength, activity tolerance, and functional mobility as displayed in pt's ability to perform step ups and squats with dynamic reaching today. Pt performed step ups with CGA-min A for cueing for upright trunk posture and CGA for safety. As pt fatigued he required more cueing for trunk posture and needed min A for elevation on the step. Pt would benefit from future interventions that challenge his dynamic balance, functional mobility, eccentric control when transferring, and activity tolerance. Will continue to follow acutely.   If plan is discharge home, recommend the following: A little help with walking and/or transfers;A little help with bathing/dressing/bathroom;Assistance with cooking/housework;Assist for transportation;Help with stairs or ramp for entrance   Can travel by private vehicle     Yes  Equipment Recommendations  BSC/3in1    Recommendations for Other Services       Precautions / Restrictions Precautions Precautions: Fall Recall of Precautions/Restrictions: Intact Precaution/Restrictions Comments: watch HR and SpO2 Restrictions Weight Bearing Restrictions Per Provider Order: No     Mobility  Bed Mobility Overal bed mobility: Needs Assistance Bed Mobility: Supine to Sit     Supine to sit: Supervision, HOB elevated, Used rails     General bed mobility comments: Pt able to sit up on R side of bed. Required increased time and more use of rail compared to L     Transfers Overall transfer level: Needs assistance Equipment used: Rolling walker (2 wheels) Transfers: Sit to/from Stand Sit to Stand: Contact guard assist, From elevated surface                Ambulation/Gait Ambulation/Gait assistance: Contact guard assist, Min assist Gait Distance (Feet): 35 Feet (2 bouts, 1st to sink for step ups, 5 ft; 2nd for step up and dynamic reaching intervention, 30 ft) Assistive device: Rolling walker (2 wheels) Gait Pattern/deviations: Step-through pattern, Decreased stride length, Decreased dorsiflexion - right, Shuffle, Trunk flexed Gait velocity: reduced Gait velocity interpretation: <1.8 ft/sec, indicate of risk for recurrent falls   General Gait Details: Pt showed improvements in stride length and speed today. Min A required for cueing for upright trunk posture and foot clearance as he fatigues. CGA for safety   Stairs             Wheelchair Mobility     Tilt Bed    Modified Rankin (Stroke Patients Only)       Balance Overall balance assessment: Needs assistance Sitting-balance support: No upper extremity supported, Feet supported Sitting balance-Leahy Scale: Good Sitting balance - Comments: Pt sat EOB without UE support. No LOB. slight fwd trunk lean which he corrected with cueing but would soon revert back Postural control: Other (comment) Standing balance support: Bilateral upper extremity supported, No upper extremity supported, During functional activity Standing balance-Leahy Scale: Fair Standing balance comment: Pt stands without LOB or postural lean.                            Communication Communication Communication: No apparent difficulties  Cognition  Arousal: Alert Behavior During Therapy: WFL for tasks assessed/performed   PT - Cognitive impairments: No apparent impairments                       PT - Cognition Comments: At times, pt can be slower to process verbal information and  needs examples/ instructions performed multiple times Following commands: Intact      Cueing Cueing Techniques: Verbal cues, Visual cues  Exercises Other Exercises Other Exercises: Step ups with 6 inch step, x6 Bil, at room sink with Bil UE support on sink Other Exercises: Step up and dynamic movement course x1 lap w/ RW. (Pt had to step up 6 inch box then squat/reach down to pick up an item from a chair, then go back over the box, and finish with picking up another item in a chair across the room. No LOB, CGA for safety and cueing for course)    General Comments General comments (skin integrity, edema, etc.): Upon sitting after step ups, pt's SpO2 decreased to 85% on 1.5 L. After resting and increasing SpO2 to 2 L, sats stayed > 93%      Pertinent Vitals/Pain Pain Assessment Pain Assessment: Faces Faces Pain Scale: Hurts a little bit Pain Intervention(s): Monitored during session    Home Living                          Prior Function            PT Goals (current goals can now be found in the care plan section) Acute Rehab PT Goals Patient Stated Goal: Move easier PT Goal Formulation: With patient Time For Goal Achievement: 02/23/23 Potential to Achieve Goals: Good Progress towards PT goals: Progressing toward goals    Frequency    Min 1X/week      PT Plan      Co-evaluation              AM-PAC PT "6 Clicks" Mobility   Outcome Measure  Help needed turning from your back to your side while in a flat bed without using bedrails?: A Little Help needed moving from lying on your back to sitting on the side of a flat bed without using bedrails?: A Little Help needed moving to and from a bed to a chair (including a wheelchair)?: A Little Help needed standing up from a chair using your arms (e.g., wheelchair or bedside chair)?: A Little Help needed to walk in hospital room?: A Little Help needed climbing 3-5 steps with a railing? : A Little 6 Click  Score: 18    End of Session Equipment Utilized During Treatment: Gait belt;Oxygen Activity Tolerance: Patient tolerated treatment well Patient left: in chair;with call bell/phone within reach;with chair alarm set   PT Visit Diagnosis: Unsteadiness on feet (R26.81);Other abnormalities of gait and mobility (R26.89);Muscle weakness (generalized) (M62.81);Difficulty in walking, not elsewhere classified (R26.2)     Time: 1551-1630 PT Time Calculation (min) (ACUTE ONLY): 39 min  Charges:    $Gait Training: 8-22 mins $Therapeutic Exercise: 23-37 mins PT General Charges $$ ACUTE PT VISIT: 1 Visit                    [address], SPT   [place] 02/20/2023, 5:54 PM

## 2023-02-21 ENCOUNTER — Inpatient Hospital Stay (HOSPITAL_COMMUNITY): Payer: Medicare Other

## 2023-02-21 DIAGNOSIS — I4891 Unspecified atrial fibrillation: Secondary | ICD-10-CM | POA: Diagnosis not present

## 2023-02-21 DIAGNOSIS — I5021 Acute systolic (congestive) heart failure: Secondary | ICD-10-CM | POA: Diagnosis not present

## 2023-02-21 LAB — COMPREHENSIVE METABOLIC PANEL
ALT: 18 U/L (ref 0–44)
AST: 38 U/L (ref 15–41)
Albumin: 2.4 g/dL — ABNORMAL LOW (ref 3.5–5.0)
Alkaline Phosphatase: 65 U/L (ref 38–126)
Anion gap: 12 (ref 5–15)
BUN: 29 mg/dL — ABNORMAL HIGH (ref 8–23)
CO2: 32 mmol/L (ref 22–32)
Calcium: 8.8 mg/dL — ABNORMAL LOW (ref 8.9–10.3)
Chloride: 92 mmol/L — ABNORMAL LOW (ref 98–111)
Creatinine, Ser: 1.34 mg/dL — ABNORMAL HIGH (ref 0.61–1.24)
GFR, Estimated: 59 mL/min — ABNORMAL LOW (ref >60)
Glucose, Bld: 111 mg/dL — ABNORMAL HIGH (ref 70–99)
Potassium: 4.3 mmol/L (ref 3.5–5.1)
Sodium: 136 mmol/L (ref 135–145)
Total Bilirubin: 0.7 mg/dL (ref 0.0–1.2)
Total Protein: 6.5 g/dL (ref 6.5–8.1)

## 2023-02-21 LAB — ECHOCARDIOGRAM LIMITED
Calc EF: 48.3 %
Height: 70 in
Single Plane A2C EF: 49.1 %
Single Plane A4C EF: 47.1 %
Weight: 2883.2 [oz_av]

## 2023-02-21 LAB — COOXEMETRY PANEL
Carboxyhemoglobin: 2.2 % — ABNORMAL HIGH (ref 0.5–1.5)
Methemoglobin: <0.7 % (ref 0.0–1.5)
O2 Saturation: 65.4 %
Total hemoglobin: 8.3 g/dL — ABNORMAL LOW (ref 12.0–16.0)

## 2023-02-21 MED ORDER — FUROSEMIDE 40 MG PO TABS
80.0000 mg | ORAL_TABLET | Freq: Every day | ORAL | Status: DC
  Administered 2023-02-21 – 2023-02-22 (×2): 80 mg via ORAL
  Filled 2023-02-21 (×2): qty 2

## 2023-02-21 NOTE — Progress Notes (Signed)
 Rounding Note    Patient Name: Justin Fleming Date of Encounter: 02/21/2023  [facility] Cardiologist: Nona Dell, MD   Subjective   No acute overnight events. Diuresed well with additional lasix. Coox 63. Feels well. Feels ready to go to SNF.  Inpatient Medications    Scheduled Meds:  amiodarone  200 mg Oral BID   Followed by   Melene Muller ON 02/24/2023] amiodarone  200 mg Oral Daily   apixaban  5 mg Oral BID   diclofenac Sodium  2 g Topical QID   feeding supplement  237 mL Oral BID BM   guaiFENesin  1,200 mg Oral BID   midodrine  15 mg Oral TID WC   pantoprazole  40 mg Oral BID   sodium chloride flush  10-40 mL Intracatheter Q12H   sodium chloride flush  3 mL Intravenous Q12H   thiamine  100 mg Oral Daily   Continuous Infusions:   PRN Meds: acetaminophen **OR** acetaminophen, HYDROcodone-acetaminophen, levalbuterol, ondansetron **OR** ondansetron (ZOFRAN) IV, sodium chloride flush, sodium chloride flush   Vital Signs    Vitals:   02/20/23 1953 02/20/23 2341 02/21/23 0403 02/21/23 0740  BP: 99/73 96/67 94/68  106/80  Pulse: 71 83 86 89  Resp: 20 16 19 19   Temp: 97.6 F (36.4 C) 97.9 F (36.6 C) 98.1 F (36.7 C) 98.1 F (36.7 C)  TempSrc: Oral Oral Oral Oral  SpO2: 95% 98% 90% 97%  Weight:   81.7 kg   Height:        Intake/Output Summary (Last 24 hours) at 02/21/2023 0847 Last data filed at 02/20/2023 2200 Gross per 24 hour  Intake 786 ml  Output 1900 ml  Net -1114 ml      02/21/2023    4:03 AM 02/20/2023    3:59 AM 02/19/2023    5:00 AM  Last 3 Weights  Weight (lbs) 180 lb 3.2 oz 174 lb 178 lb 9.2 oz  Weight (kg) 81.738 kg 78.926 kg 81 kg      Telemetry    Normal sinus rhythm with rates in the 70s. - Personally Reviewed  ECG    No new ECG tracing today. - Personally Reviewed  Physical Exam   GEN: No acute distress.   Neck: No JVD. Cardiac: RRR. No murmurs, rubs, or gallops.  Respiratory: No increased work of breathing.  Decreased breath sounds in bilateral bases. No wheezes, rhonchi, or rales appreciated. MS: Trace lower extremity edema bilaterally; No deformity. Skin: Warm and dry. Neuro:  No focal deficits. Psych: Normal affect. Responds appropriately.  Labs    High Sensitivity Troponin:   Recent Labs  Lab 02/08/23 1639 02/08/23 1844 02/08/23 2304 02/09/23 0222  TROPONINIHS 49* 53* 68* 67*     Chemistry Recent Labs  Lab 02/15/23 0409 02/16/23 0420 02/17/23 0528 02/18/23 0927 02/19/23 0605 02/20/23 0355 02/21/23 0418  NA 133* 136 129*   < > 131* 132* 136  K 4.0 3.6 4.1   < > 4.0 4.3 4.3  CL 83* 85* 80*   < > 89* 90* 92*  CO2 34* 36* 33*   < > 32 31 32  GLUCOSE 157* 112* 269*   < > 100* 105* 111*  BUN 19 21 18    < > 26* 30* 29*  CREATININE 1.61* 1.35* 1.15   < > 1.45* 1.31* 1.34*  CALCIUM 8.7* 8.6* 8.6*   < > 8.3* 8.5* 8.8*  MG 1.7 2.2 2.0  --   --   --   --  PROT 5.8* 5.8* 6.1*   < > 6.4* 6.4* 6.5  ALBUMIN 2.2* 2.1* 2.1*   < > 2.2* 2.3* 2.4*  AST 21 23 23    < > 27 29 38  ALT 11 11 10    < > 14 15 18   ALKPHOS 54 52 52   < > 54 57 65  BILITOT 1.3* 1.1 1.0   < > 1.0 0.6 0.7  GFRNONAA 47* 58* >60   < > 53* >60 59*  ANIONGAP 16* 15 16*   < > 10 11 12    < > = values in this interval not displayed.    Lipids No results for input(s): "CHOL", "TRIG", "HDL", "LABVLDL", "LDLCALC", "CHOLHDL" in the last 168 hours.  Hematology Recent Labs  Lab 02/17/23 0528 02/18/23 0927 02/18/23 1137 02/20/23 1208  WBC 11.1* 9.7  --  10.5  RBC 2.61* 2.95*  --  2.74*  HGB 7.7* 8.6* 9.9*  9.5* 7.9*  HCT 24.5* 27.4* 29.0*  28.0* 25.1*  MCV 93.9 92.9  --  91.6  MCH 29.5 29.2  --  28.8  MCHC 31.4 31.4  --  31.5  RDW 18.4* 18.2*  --  18.0*  PLT 442* 522*  --  569*   Thyroid No results for input(s): "TSH", "FREET4" in the last 168 hours.  BNP Recent Labs  Lab 02/15/23 0409 02/16/23 0500  BNP 964.6* 943.3*    DDimer No results for input(s): "DDIMER" in the last 168 hours.   Radiology    No  results found.   Cardiac Studies   TTE 02/09/2023: Impressions: 1. Left ventricular ejection fraction, by estimation, is 30 to 35%. The  left ventricle has moderately decreased function. The left ventricle  demonstrates global hypokinesis. Left ventricular diastolic function could  not be evaluated.   2. Right ventricular systolic function is moderately reduced. The right  ventricular size is moderately enlarged. Tricuspid regurgitation signal is  inadequate for assessing PA pressure.   3. Left atrial size was moderately dilated.   4. Right atrial size was moderately dilated.   5. The mitral valve is normal in structure. Mild to moderate mitral valve  regurgitation. No evidence of mitral stenosis.   6. The aortic valve is tricuspid. Aortic valve regurgitation is not  visualized. No aortic stenosis is present.  _______________   TEE/ DCCV 02/11/2023: Impressions: 1. Left ventricular ejection fraction, by estimation, is 30 to 35%. The  left ventricle has moderately decreased function.   2. No left atrial/left atrial appendage thrombus was detected.   3. Mild mitral valve regurgitation.   4. Tricuspid valve regurgitation is mild to moderate.   5. Aortic valve regurgitation is trivial.   6. There is a moderately sized patent foramen ovale with predominantly  right to left shunting across the atrial septum.   Conclusion(s)/Recommendation(s): No LA/LAA thrombus identified. Negative  bubble study for interatrial shunt. No intracardiac source of embolism  detected on this on this transesophageal echocardiogram.   Patient Profile     63 y.o. male a history of persistent atrial fibrillation (on Amiodarone, Digoxin, and Eliquis), chronic HFpEF, hypotension on Midodrine, GERD and known gastritis with recent GI bleed in 01/2023, anxiety/ depression, and alcohol abuse who was admitted on 02/08/2023 with atrial fibrillation with RVR and acute on chronic CHF. Echo this admission showed a drop in EF  to 30-35%.   Assessment & Plan    Acute on chronic HFrEF AKI on CKD stage III -Patient needs an ischemic evaluation but has recently  been cardioverted and there is hesitation to hold Eliquis for procedure. -Right heart cath reviewed .  Recall patient was on milrinone for several days with very adequate diuresis.  Creatinine now stable. Will order lasix 80 mg daily po and this can be reassessed at SNF -Right heart cath data suggests normal cardiac output therefore continued milrinone is not indicated.  Co. ox drop may have been misleading. - repeat limited echo today to reassess biventricular function. Will complete cardiac MRI as an outpatient as it is nonurgent and he does require a LHC after 30 days from cardioversion unless recurrent symptoms and needed sooner.   Atrial fibrillation now status post cardioversion -Continue Eliquis 5 mg twice daily.  He is also on amiodarone 200 mg twice daily to complete loading and then will transition to 200 mg daily.  Given that he had a respiratory infection and pneumonia, short course of amiodarone may be in his best interest to maintain sinus rhythm which he is doing right now.  Hypotension -Patient is hypotensive and on midodrine, this does not seem to be from low output heart failure.  Blood pressure low but stable on midodrine.   Chronic Anemia History of GI Bleed FOBT was positive during last admission in 12/2022. EGD showed gastritis but no signs of bleeding.  - Hemoglobin has dropped to 7.9, management per primary service but consider transfusion with heart failure. - Continue Protonix 40mg  daily.   Acute on CKD Stage III Baseline creatinine around 1.1 to 1.2. Creatinine has fluctuated upon and down this admission. - Creatinine is up from 1.13 yesterday to 1.45 , now 1.31>1.34,   Otherwise, per primary team: - URI (coronaravirus OC43) - CAP - Alcohol abuse - Elevated Total Bili  - Alcohol induced fatty liver - Hypoalbuminemia -  Hyponatremia  For questions or updates, please contact [facility] Please consult [email or web address] for contact info under        Signed, Parke Poisson, MD  02/21/2023, 8:47 AM

## 2023-02-21 NOTE — Plan of Care (Signed)
  Problem: Education: Goal: Knowledge of General Education information will improve Description: Including pain rating scale, medication(s)/side effects and non-pharmacologic comfort measures Outcome: Progressing   Problem: Clinical Measurements: Goal: Respiratory complications will improve Outcome: Progressing Goal: Cardiovascular complication will be avoided Outcome: Progressing   Problem: Clinical Measurements: Goal: Cardiovascular complication will be avoided Outcome: Progressing   Problem: Nutrition: Goal: Adequate nutrition will be maintained Outcome: Progressing   Problem: Coping: Goal: Level of anxiety will decrease Outcome: Progressing   Problem: Activity: Goal: Ability to return to baseline activity level will improve Outcome: Progressing

## 2023-02-21 NOTE — Plan of Care (Signed)
  Problem: Education: Goal: Knowledge of General Education information will improve Description: Including pain rating scale, medication(s)/side effects and non-pharmacologic comfort measures Outcome: Progressing   Problem: Health Behavior/Discharge Planning: Goal: Ability to manage health-related needs will improve Outcome: Progressing   Problem: Clinical Measurements: Goal: Ability to maintain clinical measurements within normal limits will improve Outcome: Progressing Goal: Will remain free from infection Outcome: Progressing Goal: Diagnostic test results will improve Outcome: Progressing Goal: Respiratory complications will improve Outcome: Progressing Goal: Cardiovascular complication will be avoided Outcome: Progressing   Problem: Activity: Goal: Risk for activity intolerance will decrease Outcome: Progressing   Problem: Nutrition: Goal: Adequate nutrition will be maintained Outcome: Progressing   Problem: Coping: Goal: Level of anxiety will decrease Outcome: Progressing   Problem: Elimination: Goal: Will not experience complications related to bowel motility Outcome: Progressing Goal: Will not experience complications related to urinary retention Outcome: Progressing   Problem: Pain Managment: Goal: General experience of comfort will improve and/or be controlled Outcome: Progressing   Problem: Safety: Goal: Ability to remain free from injury will improve Outcome: Progressing   Problem: Skin Integrity: Goal: Risk for impaired skin integrity will decrease Outcome: Progressing   Problem: Education: Goal: Ability to demonstrate management of disease process will improve Outcome: Progressing Goal: Ability to verbalize understanding of medication therapies will improve Outcome: Progressing Goal: Individualized Educational Video(s) Outcome: Progressing   Problem: Activity: Goal: Capacity to carry out activities will improve Outcome: Progressing    Problem: Cardiac: Goal: Ability to achieve and maintain adequate cardiopulmonary perfusion will improve Outcome: Progressing   Problem: Education: Goal: Knowledge of disease or condition will improve Outcome: Progressing Goal: Understanding of medication regimen will improve Outcome: Progressing Goal: Individualized Educational Video(s) Outcome: Progressing   Problem: Activity: Goal: Ability to tolerate increased activity will improve Outcome: Progressing   Problem: Cardiac: Goal: Ability to achieve and maintain adequate cardiopulmonary perfusion will improve Outcome: Progressing   Problem: Health Behavior/Discharge Planning: Goal: Ability to safely manage health-related needs after discharge will improve Outcome: Progressing   Problem: Education: Goal: Understanding of CV disease, CV risk reduction, and recovery process will improve Outcome: Progressing Goal: Individualized Educational Video(s) Outcome: Progressing   Problem: Activity: Goal: Ability to return to baseline activity level will improve Outcome: Progressing   Problem: Cardiovascular: Goal: Ability to achieve and maintain adequate cardiovascular perfusion will improve Outcome: Progressing Goal: Vascular access site(s) Level 0-1 will be maintained Outcome: Progressing   Problem: Health Behavior/Discharge Planning: Goal: Ability to safely manage health-related needs after discharge will improve Outcome: Progressing   Problem: Education: Goal: Understanding of CV disease, CV risk reduction, and recovery process will improve Outcome: Progressing Goal: Individualized Educational Video(s) Outcome: Progressing   Problem: Activity: Goal: Ability to return to baseline activity level will improve Outcome: Progressing   Problem: Cardiovascular: Goal: Ability to achieve and maintain adequate cardiovascular perfusion will improve Outcome: Progressing Goal: Vascular access site(s) Level 0-1 will be  maintained Outcome: Progressing   Problem: Health Behavior/Discharge Planning: Goal: Ability to safely manage health-related needs after discharge will improve Outcome: Progressing

## 2023-02-21 NOTE — Progress Notes (Signed)
 PROGRESS NOTE    Justin Fleming  NWG:[id number] DOB: 1960/10/06 DOA: 02/08/2023 PCP: [name], FNP   Brief Narrative:  The patient is a 63 year old AAM with a past medical history significant for atrial fibrillation, CHF, gout, GERD, history of alcohol abuse and other comorbidities who continues to drink significant alcohol.  Is admitted with A-fib with RVR and acute on chronic systolic congestive heart failure. He also was noted to have worsening renal function.  Details of hospitalization as below.  Assessment & Plan:   Principal Problem:   Atrial fibrillation with RVR (HCC) Active Problems:   Alcohol abuse   Elevated troponin   Atrial fibrillation with rapid ventricular response (HCC)   Chronic anemia   Acute on chronic diastolic CHF (congestive heart failure) (HCC)   Alcohol induced fatty liver   URI (upper respiratory infection)  Atrial Fibrillation with RVR (HCC) s/p TEE/DCCV and conversion to NSR -TSH was 2.743. Patient was on amiodarone drip and transitioned to oral amiodarone as below however it has been changed patient back to amiodarone drip given that he was started on milrinone but milrinone was stopped and he was transitioned to oral amiodarone again on 02/17/2023.  Patient not on beta-blocker due to hypotension. 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 -Status post TEE/DCCV 02/11/23, Findings on TEE showed "Moderate reduced LV function, Mild MR,+PFO,Negative study for LAA thrombus".  Maintaining sinus rhythm.  Remains on p.o. amiodarone and Eliquis.  No beta-blocker due to hypotension and requiring midodrine.  Management per cardiology.     Acute respiratory failure with hypoxia secondary to acute on Chronic Systolic CHF/Cardiomyopathy/hypotension: -Echocardiogram done and showed an LVEF of 30 to 35% with global hypokinesis, moderately reduced right ventricular function, moderate biatrial enlargement and mild to moderate MR; this EF is  down from 50 to 55% back in December Patient was diuresed with IV Lasix, started on midodrine to support blood pressures and cardiology feel that his GDMT is limited otherwise. PICC line was placed on 2/7/ and he was started on IV Milrinone on to assist with IV diuresis given low CO-OX. Milrinone was weaned off on 2/12. Repeat CO-OX on 2/13 had dropped from 92 to 56. Therefore, he underwent RHC to help better assess volume status and cardiac output - this showed normal to mildly elevated pre and post capillary filling pressure and normal cardiac output/ index. Bump in creatinine 02/19/2023 may be due to renal congestion given mildly elevated filling pressures on RHC yesterday (PCWP was 17 mmHg).  Patient given additional dose of Lasix, now has been transitioned to Lasix 80 mg p.o. daily.   Per cardiology, unable to perform LHC since anticoagulation cannot be stopped due to recent DCCV.  Discussed with cardiology/Dr. Jacques Navy in person, cardiac MRI can be done as an outpatient.  Limited echo to be obtained today.  Patient can be discharged either today or tomorrow.  Waiting for TOC to confirm with the facility if facility will accept him today.   Alcohol Abuse, Continuous -Counseled to quit alcohol.  No signs of withdrawal this admission.   Chronic Normocytic Anemia, stable/history of GI bleed FOBT was positive during last admission in 12/2022. EGD showed gastritis but no signs of bleeding. Baseline hemoglobin appears to be between 8-10.  Hemoglobin 8.6 today.  Will continue to monitor and target to transfuse if less than 7.5 due to acute CHF and hypoxia.  Continue Protonix.  Hypokalemia Resolved   Hypomagnesemia Resolved   AKI vs CKD stage IIIa ruled out.  Just  clarifying that previous to this hospitalization, patient's baseline creatinine appears to be between 1.07-1.23.  However during this hospitalization patient's creatinine peaked at 1.49, that was on day of admission.  Patient does not carry any  history of CKD based on previous creatinine.  Creatinine improved to 1.15 but jumped to 1.45 02/19/2023, somewhat improved to 1.34 today.    Hyponatremia: Fluctuating, resolved today.   Elevated Troponin -In the setting of tachycardia, demand ischemia. -Troponin went from 68 -> 67 -Continue to monitor  -Cardiology is following   Alcohol Induced Fatty Liver -Refer to GI team on discharge.   URI (Upper Respiratory Infection) with Coronavirus OC43 (Not COVID)/community-acquired pneumonia -Respiratory panel significant for Coronavirus OC43.  Blood culture negative.  Chest x-ray from 02/16/2023 shows left middle and lower lobe infiltrates, consistent with pneumonia.  Patient has been started on Rocephin and doxycycline and has received total of 6 days of antibiotics, since he is afebrile with normal leukocytosis, will stop antibiotics.  Patient did spike fever of 102.2 at 4 PM on 02/16/2023.  Afebrile since then.  Tobacco Abuse I have discussed tobacco cessation with the patient.  I have counseled the patient regarding the negative impacts of continued tobacco use including but not limited to lung cancer, COPD, and cardiovascular disease.  I have discussed alternatives to tobacco and modalities that may help facilitate tobacco cessation including but not limited to biofeedback, hypnosis, and medications.  Total time spent with tobacco counseling was 5 minutes.  DVT prophylaxis: Eliquis   Code Status: Full Code  Family Communication:  None present at bedside.  Plan of care discussed with patient in length and he/she verbalized understanding and agreed with it.  Status is: Inpatient Remains inpatient appropriate because: Patient medically stable, pending placement to SNF.   Estimated body mass index is 25.86 kg/m as calculated from the following:   Height as of this encounter: 5\' 10"  (1.778 m).   Weight as of this encounter: 81.7 kg.    Nutritional Assessment: Body mass index is 25.86  kg/m.Marland Kitchen Seen by dietician.  I agree with the assessment and plan as outlined below: Nutrition Status:        . Skin Assessment: I have examined the patient's skin and I agree with the wound assessment as performed by the wound care RN as outlined below:    Consultants:  Cardiology  Procedures:  As above  Antimicrobials:  Anti-infectives (From admission, onward)    Start     Dose/Rate Route Frequency Ordered Stop   02/16/23 2200  doxycycline (VIBRA-TABS) tablet 100 mg  Status:  Discontinued        100 mg Oral Every 12 hours 02/16/23 1359 02/20/23 1245   02/15/23 1915  cefTRIAXone (ROCEPHIN) 1 g in sodium chloride 0.9 % 100 mL IVPB  Status:  Discontinued        1 g 200 mL/hr over 30 Minutes Intravenous Every 24 hours 02/15/23 1828 02/20/23 1245   02/15/23 1915  azithromycin (ZITHROMAX) 500 mg in sodium chloride 0.9 % 250 mL IVPB  Status:  Discontinued        500 mg 250 mL/hr over 60 Minutes Intravenous Every 24 hours 02/15/23 1828 02/16/23 1359         Subjective: Patient seen and examined.  Fully alert and oriented and in good spirits.  He has no complaints.  Objective: Vitals:   02/20/23 1953 02/20/23 2341 02/21/23 0403 02/21/23 0740  BP: 99/73 96/67 94/68  106/80  Pulse: 71 83 86 89  Resp:  20 16 19 19   Temp: 97.6 F (36.4 C) 97.9 F (36.6 C) 98.1 F (36.7 C) 98.1 F (36.7 C)  TempSrc: Oral Oral Oral Oral  SpO2: 95% 98% 90% 97%  Weight:   81.7 kg   Height:        Intake/Output Summary (Last 24 hours) at 02/21/2023 1256 Last data filed at 02/21/2023 1014 Gross per 24 hour  Intake 1084 ml  Output 1600 ml  Net -516 ml   Filed Weights   02/19/23 0500 02/20/23 0359 02/21/23 0403  Weight: 81 kg 78.9 kg 81.7 kg    Examination:  General exam: Appears calm and comfortable  Respiratory system: Clear to auscultation. Respiratory effort normal. Cardiovascular system: S1 & S2 heard, RRR. No JVD, murmurs, rubs, gallops or clicks.  Trace pitting edema bilateral  lower extremity. Gastrointestinal system: Abdomen is nondistended, soft and nontender. No organomegaly or masses felt. Normal bowel sounds heard. Central nervous system: Alert and oriented. No focal neurological deficits. Extremities: Symmetric 5 x 5 power. Skin: No rashes, lesions or ulcers.  Psychiatry: Judgement and insight appear normal. Mood & affect appropriate.   Data Reviewed: I have personally reviewed following labs and imaging studies  CBC: Recent Labs  Lab 02/15/23 0409 02/16/23 0420 02/17/23 0528 02/18/23 0927 02/18/23 1137 02/20/23 1208  WBC 10.3 12.3* 11.1* 9.7  --  10.5  NEUTROABS 7.6 8.8*  --   --   --  8.6*  HGB 8.3* 8.1* 7.7* 8.6* 9.9*  9.5* 7.9*  HCT 25.7* 25.4* 24.5* 27.4* 29.0*  28.0* 25.1*  MCV 93.5 93.4 93.9 92.9  --  91.6  PLT 321 409* 442* 522*  --  569*   Basic Metabolic Panel: Recent Labs  Lab 02/15/23 0409 02/16/23 0420 02/17/23 0528 02/18/23 0927 02/18/23 1137 02/19/23 0605 02/20/23 0355 02/21/23 0418  NA 133* 136 129* 132* 133*  133* 131* 132* 136  K 4.0 3.6 4.1 4.3 4.4  4.4 4.0 4.3 4.3  CL 83* 85* 80* 91*  --  89* 90* 92*  CO2 34* 36* 33* 34*  --  32 31 32  GLUCOSE 157* 112* 269* 94  --  100* 105* 111*  BUN 19 21 18 19   --  26* 30* 29*  CREATININE 1.61* 1.35* 1.15 1.13  --  1.45* 1.31* 1.34*  CALCIUM 8.7* 8.6* 8.6* 8.3*  --  8.3* 8.5* 8.8*  MG 1.7 2.2 2.0  --   --   --   --   --   PHOS 3.2 4.1  --   --   --   --   --   --    GFR: Estimated Creatinine Clearance: 56.7 mL/min (A) (by C-G formula based on SCr of 1.34 mg/dL (H)). Liver Function Tests: Recent Labs  Lab 02/17/23 0528 02/18/23 0927 02/19/23 0605 02/20/23 0355 02/21/23 0418  AST 23 26 27 29  38  ALT 10 12 14 15 18   ALKPHOS 52 53 54 57 65  BILITOT 1.0 1.2 1.0 0.6 0.7  PROT 6.1* 6.3* 6.4* 6.4* 6.5  ALBUMIN 2.1* 2.2* 2.2* 2.3* 2.4*   No results for input(s): "LIPASE", "AMYLASE" in the last 168 hours. No results for input(s): "AMMONIA" in the last 168  hours. Coagulation Profile: No results for input(s): "INR", "PROTIME" in the last 168 hours. Cardiac Enzymes: No results for input(s): "CKTOTAL", "CKMB", "CKMBINDEX", "TROPONINI" in the last 168 hours. BNP (last 3 results) No results for input(s): "PROBNP" in the last 8760 hours. HbA1C: No results for input(s): "HGBA1C"  in the last 72 hours. CBG: No results for input(s): "GLUCAP" in the last 168 hours. Lipid Profile: No results for input(s): "CHOL", "HDL", "LDLCALC", "TRIG", "CHOLHDL", "LDLDIRECT" in the last 72 hours. Thyroid Function Tests: No results for input(s): "TSH", "T4TOTAL", "FREET4", "T3FREE", "THYROIDAB" in the last 72 hours. Anemia Panel: No results for input(s): "VITAMINB12", "FOLATE", "FERRITIN", "TIBC", "IRON", "RETICCTPCT" in the last 72 hours. Sepsis Labs: Recent Labs  Lab 02/16/23 0935 02/16/23 1200 02/17/23 0528  PROCALCITON  --   --  0.48  LATICACIDVEN 0.9 1.6  --     Recent Results (from the past 240 hours)  MRSA Next Gen by PCR, Nasal     Status: None   Collection Time: 02/17/23  9:30 AM   Specimen: Nasal Mucosa; Nasal Swab  Result Value Ref Range Status   MRSA by PCR Next Gen NOT DETECTED NOT DETECTED Final    Comment: (NOTE) The GeneXpert MRSA Assay (FDA approved for NASAL specimens only), is one component of a comprehensive MRSA colonization surveillance program. It is not intended to diagnose MRSA infection nor to guide or monitor treatment for MRSA infections. Test performance is not FDA approved in patients less than 47 years old. Performed at [facility], [address] [place], [place] [address]      Radiology Studies: ECHOCARDIOGRAM LIMITED Result Date: 02/21/2023    ECHOCARDIOGRAM LIMITED REPORT   Patient Name:   Justin Fleming Date of Exam: 02/21/2023 Medical Rec #:  [id number]        Height:       70.0 in Accession #:    [id number]       Weight:       180.2 lb Date of Birth:  [birth year]        BSA:          1.997 m Patient Age:     63 years         BP:           106/80 mmHg Patient Gender: M                HR:           84 bpm. Exam Location:  Inpatient Procedure: Limited Echo, Color Doppler and Cardiac Doppler (Both Spectral and            Color Flow Doppler were utilized during procedure). Indications:    I50.21 Acute systolic (congestive) heart failure  History:        Patient has prior history of Echocardiogram examinations, most                 recent 02/11/2023. CHF, Arrythmias:Atrial Fibrillation; Risk                 Factors:Hypertension and ETOH.  Sonographer:    Irving Burton Senior RDCS Referring Phys: [id number] Parke Poisson IMPRESSIONS  1. Left ventricular ejection fraction, by estimation, is 60 to 65%. The left ventricle has normal function. There is severe left ventricular hypertrophy.  2. Right ventricular systolic function is mildly reduced. The right ventricular size is normal.  3. The inferior vena cava is normal in size with <50% respiratory variability, suggesting right atrial pressure of 8 mmHg. Comparison(s): Prior images reviewed side by side. Changes from prior study are noted. LVEF has improved. RV function may have improved by one grade. RA pressure estimate matches clinically obtained CVP today. FINDINGS  Left Ventricle: Left ventricular ejection fraction, by estimation, is 60 to 65%. The left  ventricle has normal function. There is severe left ventricular hypertrophy. Right Ventricle: The right ventricular size is normal. Right vetricular wall thickness was not well visualized. Right ventricular systolic function is mildly reduced. Tricuspid Valve: The tricuspid valve is normal in structure. Tricuspid valve regurgitation is mild. Venous: The inferior vena cava is normal in size with less than 50% respiratory variability, suggesting right atrial pressure of 8 mmHg. Additional Comments: Spectral Doppler performed. Color Doppler performed.  LEFT VENTRICLE PLAX 2D LV PW:         1.60 cm LV IVS:        1.76 cm  LV Volumes  (MOD) LV vol d, MOD A2C: 109.0 ml LV vol d, MOD A4C: 80.0 ml LV vol s, MOD A2C: 55.5 ml LV vol s, MOD A4C: 42.3 ml LV SV MOD A2C:     53.5 ml LV SV MOD A4C:     80.0 ml LV SV MOD BP:      46.7 ml RIGHT VENTRICLE RV S prime:     6.53 cm/s TAPSE (M-mode): 1.2 cm Weston Brass MD Electronically signed by Weston Brass MD Signature Date/Time: 02/21/2023/11:12:26 AM    Final      Scheduled Meds:  amiodarone  200 mg Oral BID   Followed by   Melene Muller ON 02/24/2023] amiodarone  200 mg Oral Daily   apixaban  5 mg Oral BID   diclofenac Sodium  2 g Topical QID   feeding supplement  237 mL Oral BID BM   furosemide  80 mg Oral Daily   guaiFENesin  1,200 mg Oral BID   midodrine  15 mg Oral TID WC   pantoprazole  40 mg Oral BID   sodium chloride flush  10-40 mL Intracatheter Q12H   sodium chloride flush  3 mL Intravenous Q12H   thiamine  100 mg Oral Daily   Continuous Infusions:     LOS: 12 days   Hughie Closs, MD [facility]  02/21/2023, 12:56 PM   *Please note that this is a verbal dictation therefore any spelling or grammatical errors are due to the "Dragon Medical One" system interpretation.  Please page via Amion and do not message via secure chat for urgent patient care matters. Secure chat can be used for non urgent patient care matters.  How to contact the [facility] Attending or Consulting provider 7A - 7P or covering provider during after hours 7P -7A, for this patient?  Check the care team in [facility] and look for a) attending/consulting TRH provider listed and b) the [facility] team listed. Page or secure chat 7A-7P. Log into [email or web address] and use [place]'s universal password to access. If you do not have the password, please contact the hospital operator. Locate the [facility] provider you are looking for under [facility] and page to a number that you can be directly reached. If you still have difficulty reaching the provider, please page the [facility] (Director on Call) for the Hospitalists  listed on amion for assistance.

## 2023-02-22 ENCOUNTER — Other Ambulatory Visit (HOSPITAL_COMMUNITY): Payer: Self-pay

## 2023-02-22 DIAGNOSIS — I4891 Unspecified atrial fibrillation: Secondary | ICD-10-CM | POA: Diagnosis not present

## 2023-02-22 LAB — COOXEMETRY PANEL
Carboxyhemoglobin: 2.6 % — ABNORMAL HIGH (ref 0.5–1.5)
Methemoglobin: <0.7 % (ref 0.0–1.5)
O2 Saturation: 73.6 %
Total hemoglobin: 8.5 g/dL — ABNORMAL LOW (ref 12.0–16.0)

## 2023-02-22 LAB — COMPREHENSIVE METABOLIC PANEL
ALT: 18 U/L (ref 0–44)
AST: 36 U/L (ref 15–41)
Albumin: 2.6 g/dL — ABNORMAL LOW (ref 3.5–5.0)
Alkaline Phosphatase: 65 U/L (ref 38–126)
Anion gap: 13 (ref 5–15)
BUN: 29 mg/dL — ABNORMAL HIGH (ref 8–23)
CO2: 31 mmol/L (ref 22–32)
Calcium: 9 mg/dL (ref 8.9–10.3)
Chloride: 92 mmol/L — ABNORMAL LOW (ref 98–111)
Creatinine, Ser: 1.48 mg/dL — ABNORMAL HIGH (ref 0.61–1.24)
GFR, Estimated: 52 mL/min — ABNORMAL LOW (ref >60)
Glucose, Bld: 95 mg/dL (ref 70–99)
Potassium: 4.7 mmol/L (ref 3.5–5.1)
Sodium: 136 mmol/L (ref 135–145)
Total Bilirubin: 0.6 mg/dL (ref 0.0–1.2)
Total Protein: 7 g/dL (ref 6.5–8.1)

## 2023-02-22 MED ORDER — FUROSEMIDE 80 MG PO TABS
80.0000 mg | ORAL_TABLET | Freq: Every day | ORAL | 0 refills | Status: DC
  Filled 2023-02-22: qty 30, 30d supply, fill #0

## 2023-02-22 MED ORDER — MIDODRINE HCL 5 MG PO TABS
15.0000 mg | ORAL_TABLET | Freq: Three times a day (TID) | ORAL | 0 refills | Status: DC
  Filled 2023-02-22: qty 270, 30d supply, fill #0

## 2023-02-22 MED ORDER — AMIODARONE HCL 200 MG PO TABS
ORAL_TABLET | ORAL | 0 refills | Status: DC
  Filled 2023-02-22: qty 34, 32d supply, fill #0

## 2023-02-22 NOTE — TOC Transition Note (Signed)
 Transition of Care [facility]) - Discharge Note   Patient Details  Name: Justin Fleming MRN: [id number] Date of Birth: [birth year]  [facility] CM/SW Contact:  Delilah Shan, LCSWA Phone Number: 02/22/2023, 10:59 AM   Clinical Narrative:     [facility]   Anticipated DC date: 02/22/2023  Family notified: Patient informed CSW he spoke with Myriam Jacobson  Transport by: Sharin Mons   ?  Per MD patient ready for DC to [facility]. RN, patient, patient's family, and facility notified of DC. Discharge Summary sent to facility. RN given number for report [phone] RM# [phone]. DC packet on chart. Ambulance transport requested for patient.  CSW signing off.   Final next level of care: Skilled Nursing Facility Barriers to Discharge: No Barriers Identified   Patient Goals and CMS Choice Patient states their goals for this hospitalization and ongoing recovery are:: SNF CMS Medicare.gov Compare Post Acute Care list provided to:: Patient Choice offered to / list presented to : Patient      Discharge Placement              Patient chooses bed at: [facility] Patient to be transferred to facility by: [facility] Name of family member notified: Mary Patient and family notified of of transfer: 02/22/23  Discharge Plan and Services Additional resources added to the After Visit Summary for   In-house Referral: Clinical Social Work                                   Social Drivers of Health (SDOH) Interventions SDOH Screenings   Food Insecurity: No Food Insecurity (02/08/2023)  Housing: Low Risk  (02/19/2023)  Transportation Needs: No Transportation Needs (02/19/2023)  Utilities: Not At Risk (02/08/2023)  Alcohol Screen: Low Risk  (02/19/2023)  Depression (PHQ2-9): Low Risk  (02/02/2018)  Financial Resource Strain: Low Risk  (02/19/2023)  Social Connections: Socially Isolated (02/08/2023)  Tobacco Use: Medium Risk (02/08/2023)     Readmission Risk  Interventions    01/11/2023   12:44 PM  Readmission Risk Prevention Plan  Transportation Screening Complete  PCP or Specialist Appt within 5-7 Days Not Complete  Home Care Screening Complete  Medication Review (RN CM) Complete

## 2023-02-22 NOTE — Progress Notes (Signed)
 RUE PICC removed. Site unremarkable. Vaseline gauze/ pressure dressing applied. Pt instructed to remain flat in bed for :30. Keep dressing clean, dry and in place for 24 hours. He voiced understanding.

## 2023-02-22 NOTE — Care Management Important Message (Signed)
 Important Message  Patient Details  Name: Justin Fleming MRN: [id number] Date of Birth: 13-Sep-1960   Important Message Given:  Yes - Medicare IM     Renie Ora 02/22/2023, 11:32 AM

## 2023-02-22 NOTE — Progress Notes (Signed)
 SATURATION QUALIFICATIONS: (This note is used to comply with regulatory documentation for home oxygen)  Patient Saturations on Room Air at Rest = 89%  Patient Saturations on Room Air while Ambulating = 85%  Patient Saturations on 2 Liters of oxygen while Ambulating = 90%  Please briefly explain why patient needs home oxygen:Pt needed 2LO2 with activity to keep sats > 90%. Tanaiya Kolarik M,PT Acute Rehab Services [phone]

## 2023-02-22 NOTE — Progress Notes (Signed)
 Physical Therapy Treatment Patient Details Name: Justin Fleming MRN: [id number] DOB: 1960/02/09 Today's Date: 02/22/2023   History of Present Illness 63 y.o. male presented 02/08/23 with tachycardia, febrile, upper respiratory symptoms. +Coronavirus (not COVID)  Afib with RVR. Underwent R heart cath 02/17/23   PMH significant of a-fib, CHF, gout, GERD, alcohol abuse.    PT Comments  Pt admitted with above diagnosis. Pt was able to ambulate with RW with CGA. Desaturate on RA with activity to 85% with incr distance walk.  Pt 89% on RA at rest. Updated nurse regarding O2 as pt is planning to d/c today.  Needs continued therapy.  Pt currently with functional limitations due to the deficits listed below (see PT Problem List). Pt will benefit from acute skilled PT to increase their independence and safety with mobility to allow discharge.       If plan is discharge home, recommend the following: A little help with walking and/or transfers;A little help with bathing/dressing/bathroom;Assistance with cooking/housework;Assist for transportation;Help with stairs or ramp for entrance   Can travel by private vehicle     Yes  Equipment Recommendations  BSC/3in1    Recommendations for Other Services       Precautions / Restrictions Precautions Precautions: Fall Recall of Precautions/Restrictions: Intact Precaution/Restrictions Comments: watch HR and SpO2 Restrictions Weight Bearing Restrictions Per Provider Order: No     Mobility  Bed Mobility Overal bed mobility: Needs Assistance Bed Mobility: Supine to Sit     Supine to sit: Supervision, HOB elevated, Used rails Sit to supine: Supervision        Transfers Overall transfer level: Needs assistance Equipment used: Rolling walker (2 wheels) Transfers: Sit to/from Stand Sit to Stand: Contact guard assist, From elevated surface           General transfer comment: Pt continues to show improvement in transfer initation, control, and  speed.    Ambulation/Gait Ambulation/Gait assistance: Contact guard assist Gait Distance (Feet): 110 Feet Assistive device: Rolling walker (2 wheels) Gait Pattern/deviations: Step-through pattern, Decreased stride length, Decreased dorsiflexion - right, Shuffle, Trunk flexed Gait velocity: reduced Gait velocity interpretation: <1.8 ft/sec, indicate of risk for recurrent falls   General Gait Details: Pt showed improvements in stride length and speed today. CGA required for cueing for upright trunk posture. CGA for safety   Stairs             Wheelchair Mobility     Tilt Bed    Modified Rankin (Stroke Patients Only)       Balance Overall balance assessment: Needs assistance Sitting-balance support: No upper extremity supported, Feet supported Sitting balance-Leahy Scale: Good Sitting balance - Comments: Pt sat EOB without UE support. No LOB.   Standing balance support: Bilateral upper extremity supported, No upper extremity supported, During functional activity Standing balance-Leahy Scale: Poor Standing balance comment: Pt stands with bil UE support on RW.                 Standardized Balance Assessment Standardized Balance Assessment :  (unable)          Communication Communication Communication: No apparent difficulties  Cognition Arousal: Alert Behavior During Therapy: WFL for tasks assessed/performed   PT - Cognitive impairments: No apparent impairments                       PT - Cognition Comments: At times, pt can be slower to process verbal information and needs examples/ instructions performed multiple times Following commands: Intact  Cueing Cueing Techniques: Verbal cues, Visual cues  Exercises General Exercises - Lower Extremity Long Arc Quad: Seated, Both, 15 reps, Strengthening (with 5 second hold at top) Hip Flexion/Marching: AROM, Both, 15 reps, Seated    General Comments General comments (skin integrity, edema,  etc.): Desaturate on RA with activity to 85% needing 2LO2 with activity.  89% on RA at rest.      Pertinent Vitals/Pain Pain Assessment Pain Assessment: No/denies pain    Home Living                          Prior Function            PT Goals (current goals can now be found in the care plan section) Progress towards PT goals: Progressing toward goals    Frequency    Min 1X/week      PT Plan      Co-evaluation              AM-PAC PT "6 Clicks" Mobility   Outcome Measure  Help needed turning from your back to your side while in a flat bed without using bedrails?: A Little Help needed moving from lying on your back to sitting on the side of a flat bed without using bedrails?: A Little Help needed moving to and from a bed to a chair (including a wheelchair)?: A Little Help needed standing up from a chair using your arms (e.g., wheelchair or bedside chair)?: A Little Help needed to walk in hospital room?: A Little Help needed climbing 3-5 steps with a railing? : A Lot 6 Click Score: 17    End of Session Equipment Utilized During Treatment: Gait belt;Oxygen Activity Tolerance: Patient tolerated treatment well Patient left: in chair;with call bell/phone within reach;with chair alarm set Nurse Communication: Mobility status PT Visit Diagnosis: Unsteadiness on feet (R26.81);Other abnormalities of gait and mobility (R26.89);Muscle weakness (generalized) (M62.81);Difficulty in walking, not elsewhere classified (R26.2)     Time: 1125-1140 PT Time Calculation (min) (ACUTE ONLY): 15 min  Charges:    $Gait Training: 8-22 mins PT General Charges $$ ACUTE PT VISIT: 1 Visit                     Justin Fleming,PT Acute Rehab Services [phone]    Justin Fleming 02/22/2023, 11:55 AM

## 2023-02-22 NOTE — Progress Notes (Signed)
 Heart Failure Stewardship Pharmacist Progress Note   PCP: [name], FNP PCP-Cardiologist: Nona Dell, MD    HPI:  Justin Fleming is 42 YOM with PMH CHF, AF, HTN, gout, alcohol abuse, and depression  Patient recently had been admitted to a detox facility prior to visiting his PCP to establish care. At the visit with PCP on 2/3, patient demonstrated DOE, tachycardic, and was febrile. EKG obtained showed AF RVR prompting referral to ED. Upon arrival, pt states he had been experiencing SOB for 1-2 weeks with chills and weakness. He denied chest pain or palpitations. He was alert and oriented with no reported changes in appetite. Physical examination showed 3-4+ bilateral LEE to mid thigh, JVD to earlobe, and reduced breathing sounds observed. Labs obtained BNP 570.9, flat troponin, lactate 1.4, Scr 1.49, BUN 15, Na 142, K 3.7. CXR showed bilateral atelectasis in lung bases, cardiac enlargement, small R pleural effusion. He was given lasix 40 mg IV x1 and started on amiodarone IV gtt. ECHO on 2/4 LVEF 30-35% (prev 55-60%,12/2022), LV global hypokinesis, moderately reduced RV function, moderate bilateral enlargement, mild-moderate MV regurgitation.  Patient underwent successful TEE/DCCV on 2/6, and was converted to NSR with HR 70s. Post-procedure with poor urinary output, AMS, hypotensive, and slightly worse renal function. Concerned for low output. Lactic acid 1.6. PICC placed and initial coox was 54%. Started on milrinone with improvements in coox to 73%.  RHC on 02/18/2023 showed normal-mildly elevated filling pressures (RA 6, PA 45/18, PCWP 17) and normal output (CO/CI 6/3).  Today, patient reports he is feeling better. States that the pain in his lower extremities has improved greatly. He still has bilateral 1+ pitting LEE. He states that he was been able to get up and ambulate with PT in the room and that has helped with his lower leg pain. Coox 73% this morning after milrinone  infusion was stopped 02/17/2023 afternoon. His breathing is stable and he is off supplemental oxygen. Patient states he is still only able to lay flat for short periods of time. BP remains low 90/70s and HR now 90s on midodrine 15 mg TID.  Denies palpitations or chest pain. Plans for cardiac MRI outpatient for further work-up of HF etiology. Potential future LHC at some point, no plans at this admission d/t recent cardioversion and risk of interrupted anticoagulation.   Discharge HF Medications: Diuretic: furosemide 80 mg PO daily Other: midodrine 15 mg TID  Prior to admission HF Medications: Diuretic: furosemide 40 mg PO daily  Pertinent Lab Values: Serum creatinine 1.48, BUN 29, Potassium 4.7, Sodium 136  Vital Signs: Weight: 178 lbs (admission weight: 198 lbs) Blood pressure: 90-100/60-70 mmHg Heart rate: 70-80 bpm  I/O: net -1.0 L yesterday; net -12.9 L since admission  Medication Assistance / Insurance Benefits Check: Does the patient have prescription insurance?  Yes Type of insurance plan: Medicare Advantage   Does the patient qualify for medication assistance through manufacturers or grants?   Pending  Outpatient Pharmacy:  Prior to admission outpatient pharmacy: [facility] [place], [place] on [address] Is the patient willing to use [facility] TOC pharmacy at discharge? Yes Is the patient willing to transition their outpatient pharmacy to utilize a [facility], [facility], [facility] outpatient pharmacy?   No    Assessment: 1. 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 NYHA class II symptoms. - Patient is slightly hypervolemic upon exam with 1+ pitting LEE; strict I/Os and daily weights.  - Continue diuresis with furosemide 80 mg PO  daily - Continue midodrine 15 mg TID - Hold BB with recent discontinuation of inotrope - Hold RAASi, MRA, and SGLT2i with low BP requiring midodrine and poor renal function.   Plan: 1) Medication changes recommended at this time: - Continue  furosemide 80 mg PO daily - Monitor renal function and BP outpatient  2) Patient assistance: - Patient has Medicare Advantage plan - Plan to discuss prescription access with patient prior to discharge  3)  Education  - Final education completed prior to discharge  Wilmer Floor, PharmD PGY2 Cardiology Pharmacy Resident Heart Failure Stewardship Pharmacist Phone [phone]

## 2023-02-22 NOTE — Progress Notes (Signed)
Attempted x2 to call report, no answer.

## 2023-02-22 NOTE — Discharge Summary (Signed)
 Physician Discharge Summary  Justin Fleming ZOX:[id number] DOB: January 26, 1960 DOA: 02/08/2023  PCP: [name], FNP  Admit date: 02/08/2023 Discharge date: 02/22/2023 30 Day Unplanned Readmission Risk Score    Flowsheet Row ED to Hosp-Admission (Current) from 02/08/2023 in [place] [facility]  30 Day Unplanned Readmission Risk Score (%) 27.11 Filed at 02/22/2023 0801       This score is the patient's risk of an unplanned readmission within 30 days of being discharged (0 -100%). The score is based on dignosis, age, lab data, medications, orders, and past utilization.   Low:  0-14.9   Medium: 15-21.9   High: 22-29.9   Extreme: 30 and above          Admitted From: Home Disposition: SNF  Recommendations for Outpatient Follow-up:  Follow up with PCP in 1-2 weeks Please obtain BMP/CBC in one week Follow-up with cardiology.  Cardiology will set up time and date and will notify you. Please follow up with your PCP on the following pending results: Unresulted Labs (From admission, onward)     Start     Ordered   02/13/23 0500  CBC  Daily,   R      02/12/23 0940   02/13/23 0500  Cooxemetry Panel (carboxy, met, total hgb, O2 sat)  Daily,   R      02/12/23 1501              Home Health: None Equipment/Devices: None  Discharge Condition: Stable CODE STATUS: Full code Diet recommendation: Low-salt   Subjective: Seen and examined.  No complaints.  Discussed plan of discharge with him and he is in agreement.  Brief/Interim Summary: The patient is a 63 year old AAM with a past medical history significant for atrial fibrillation, CHF, gout, GERD, history of alcohol abuse and other comorbidities who continues to drink significant alcohol.  Presented to ED with tachycardia and admitted with A-fib with RVR and acute on chronic systolic congestive heart failure. He also was noted to have worsening renal function.  Details of hospitalization as below.   Atrial Fibrillation with  RVR (HCC) s/p TEE/DCCV and conversion to NSR -TSH was 2.743. Patient was initially started on amiodarone drip and transitioned to oral amiodarone as below however it has been changed patient back to amiodarone drip given that he was started on milrinone but milrinone was stopped and he was transitioned to oral amiodarone again on 02/17/2023.  Patient not on beta-blocker due to hypotension. 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 -Status post TEE/DCCV 02/11/23, Findings on TEE showed "Moderate reduced LV function, Mild MR,+PFO,Negative study for LAA thrombus".  Maintaining sinus rhythm.  Remains on p.o. amiodarone and Eliquis.  No beta-blocker due to hypotension and requiring midodrine.  Cardiology repeated his limited echo which showed improved ejection fraction from 30 to 60% so cardiology cleared him for discharge.  They will discuss possible cardiac MRI as outpatient.  Patient to be discharged on tapering amiodarone dose as below.   Acute respiratory failure with hypoxia secondary to acute on Chronic Systolic CHF/Cardiomyopathy/hypotension: -Echocardiogram done and showed an LVEF of 30 to 35% with global hypokinesis, moderately reduced right ventricular function, moderate biatrial enlargement and mild to moderate MR; this EF is down from 50 to 55% back in December Patient was diuresed with IV Lasix, started on midodrine to support blood pressures and cardiology feel that his GDMT is limited otherwise. PICC line was placed on 2/7/ and he was started on IV Milrinone on to assist with  IV diuresis given low CO-OX. Milrinone was weaned off on 2/12. Repeat CO-OX on 2/13 had dropped from 92 to 56. Therefore, he underwent RHC to help better assess volume status and cardiac output - this showed normal to mildly elevated pre and post capillary filling pressure and normal cardiac output/ index. Bump in creatinine 02/19/2023 may be due to renal congestion given mildly elevated filling  pressures on RHC yesterday (PCWP was 17 mmHg).  Patient given additional dose of Lasix, now has been transitioned to Lasix 80 mg p.o. daily.   Per cardiology, unable to perform LHC since anticoagulation cannot be stopped due to recent DCCV.  Discussed with cardiology/Dr. Jacques Navy in person, cardiac MRI can be done as an outpatient.     Alcohol Abuse, Continuous -Counseled to quit alcohol.  No signs of withdrawal this admission.   Chronic Normocytic Anemia, stable/history of GI bleed FOBT was positive during last admission in 12/2022. EGD showed gastritis but no signs of bleeding. Baseline hemoglobin appears to be between 8-10.  Hemoglobin around 8 today.   Hypokalemia Resolved   Hypomagnesemia Resolved   AKI vs CKD stage IIIa ruled out.  Just clarifying that previous to this hospitalization, patient's baseline creatinine appears to be between 1.07-1.23.  However during this hospitalization patient's creatinine peaked at 1.49, that was on day of admission.  Patient does not carry any history of CKD based on previous creatinine.  Creatinine improved to 1.15 but jumped to 1.45 02/19/2023, 1.48 today.  Basically stable since last 4 days.  However now this seems to be his new baseline and based on this, he will have CKD stage IIIa unless he improves.   Hyponatremia: Resolved   Elevated Troponin -In the setting of tachycardia, demand ischemia. -Troponin went from 68 -> 67 -Continue to monitor  -Cardiology is following   Alcohol Induced Fatty Liver -Refer to GI team on discharge.   URI (Upper Respiratory Infection) with Coronavirus OC43 (Not COVID)/community-acquired pneumonia -Respiratory panel significant for Coronavirus OC43.  Blood culture negative.  Chest x-ray from 02/16/2023 shows left middle and lower lobe infiltrates, consistent with pneumonia.  Patient has been started on Rocephin and doxycycline and has received total of 6 days of antibiotics, since he is afebrile with normal  leukocytosis, will stop antibiotics.  Patient did spike fever of 102.2 at 4 PM on 02/16/2023.  Afebrile since then.   Tobacco Abuse I have discussed tobacco cessation with the patient.  I have counseled the patient regarding the negative impacts of continued tobacco use including but not limited to lung cancer, COPD, and cardiovascular disease.  I have discussed alternatives to tobacco and modalities that may help facilitate tobacco cessation including but not limited to biofeedback, hypnosis, and medications.  Total time spent with tobacco counseling was 5 minutes.  Discharge plan was discussed with patient and/or family member and they verbalized understanding and agreed with it.  Discharge Diagnoses:  Principal Problem:   Atrial fibrillation with RVR (HCC) Active Problems:   Alcohol abuse   Elevated troponin   Atrial fibrillation with rapid ventricular response (HCC)   Chronic anemia   Acute on chronic diastolic CHF (congestive heart failure) (HCC)   Alcohol induced fatty liver   URI (upper respiratory infection)    Discharge Instructions   Allergies as of 02/22/2023       Reactions   Chlorhexidine Gluconate Rash        Medication List     STOP taking these medications    predniSONE 50 MG tablet  Commonly known as: DELTASONE       TAKE these medications    acetaminophen 500 MG tablet Commonly known as: TYLENOL Take 500 mg by mouth every 6 (six) hours as needed for moderate pain (pain score 4-6).   allopurinol 100 MG tablet Commonly known as: ZYLOPRIM Take 100 mg by mouth daily.   amiodarone 200 MG tablet Commonly known as: PACERONE Take 1 tablet (200 mg total) by mouth 2 (two) times daily for 2 days, THEN 1 tablet (200 mg total) daily. Start taking on: February 22, 2023 What changed:  medication strength See the new instructions.   apixaban 5 MG Tabs tablet Commonly known as: ELIQUIS Take 1 tablet (5 mg total) by mouth 2 (two) times daily.    cyanocobalamin 500 MCG tablet Commonly known as: VITAMIN B12 Take 1 tablet (500 mcg total) by mouth daily.   ferrous sulfate 325 (65 FE) MG tablet Take 1 tablet (325 mg total) by mouth 2 (two) times daily with a meal.   folic acid 1 MG tablet Commonly known as: FOLVITE Take 1 tablet (1 mg total) by mouth daily.   furosemide 80 MG tablet Commonly known as: LASIX Take 1 tablet (80 mg total) by mouth daily. What changed:  medication strength how much to take   midodrine 5 MG tablet Commonly known as: PROAMATINE Take 3 tablets (15 mg total) by mouth 3 (three) times daily with meals.   multivitamin with minerals Tabs tablet Take 1 tablet by mouth daily.   pantoprazole 40 MG tablet Commonly known as: PROTONIX Take 1 tablet (40 mg total) by mouth 2 (two) times daily.   potassium chloride SA 20 MEQ tablet Commonly known as: KLOR-CON M Take 1 tablet (20 mEq total) by mouth daily.   promethazine 12.5 MG suppository Commonly known as: PHENERGAN Place 12.5 mg rectally every 6 (six) hours as needed for nausea or vomiting.   thiamine 100 MG tablet Commonly known as: Vitamin B-1 Take 1 tablet (100 mg total) by mouth daily.        Contact information for follow-up providers     [facility]. Go in 10 day(s).   Specialty: Cardiology Why: Hospital follow up 03/01/2023 @ 8:45 am PLEASE bring a current medication list to appointment FREE valet parking, Entrance C, off [employer] information: [address] [place] [address] [phone]        [name], FNP Follow up in 1 week(s).   Specialty: Family Medicine Contact information: [address] [place] [address] [phone]              Contact information for after-discharge care     Destination     [facility] Preferred SNF .   Service: Skilled Nursing Contact information: [address] [place] [address] [phone]                    Allergies  Allergen Reactions   Chlorhexidine Gluconate Rash    Consultations: Cardiology   Procedures/Studies: ECHOCARDIOGRAM LIMITED Result Date: 02/21/2023    ECHOCARDIOGRAM LIMITED REPORT   Patient Name:   VERNAL HRITZ Date of Exam: 02/21/2023 Medical Rec #:  [id number]        Height:       70.0 in Accession #:    [id number]       Weight:       180.2 lb Date  of Birth:  22-Sep-1960        BSA:          1.997 m Patient Age:    63 years         BP:           106/80 mmHg Patient Gender: M                HR:           84 bpm. Exam Location:  Inpatient Procedure: Limited Echo, Color Doppler and Cardiac Doppler (Both Spectral and            Color Flow Doppler were utilized during procedure). Indications:    I50.21 Acute systolic (congestive) heart failure  History:        Patient has prior history of Echocardiogram examinations, most                 recent 02/11/2023. CHF, Arrythmias:Atrial Fibrillation; Risk                 Factors:Hypertension and ETOH.  Sonographer:    Irving Burton Senior RDCS Referring Phys: [id number] Parke Poisson IMPRESSIONS  1. Left ventricular ejection fraction, by estimation, is 60 to 65%. The left ventricle has normal function. There is severe left ventricular hypertrophy.  2. Right ventricular systolic function is mildly reduced. The right ventricular size is normal.  3. The inferior vena cava is normal in size with <50% respiratory variability, suggesting right atrial pressure of 8 mmHg. Comparison(s): Prior images reviewed side by side. Changes from prior study are noted. LVEF has improved. RV function may have improved by one grade. RA pressure estimate matches clinically obtained CVP today. FINDINGS  Left Ventricle: Left ventricular ejection fraction, by estimation, is 60 to 65%. The left ventricle has normal function. There is severe left ventricular hypertrophy. Right Ventricle: The  right ventricular size is normal. Right vetricular wall thickness was not well visualized. Right ventricular systolic function is mildly reduced. Tricuspid Valve: The tricuspid valve is normal in structure. Tricuspid valve regurgitation is mild. Venous: The inferior vena cava is normal in size with less than 50% respiratory variability, suggesting right atrial pressure of 8 mmHg. Additional Comments: Spectral Doppler performed. Color Doppler performed.  LEFT VENTRICLE PLAX 2D LV PW:         1.60 cm LV IVS:        1.76 cm  LV Volumes (MOD) LV vol d, MOD A2C: 109.0 ml LV vol d, MOD A4C: 80.0 ml LV vol s, MOD A2C: 55.5 ml LV vol s, MOD A4C: 42.3 ml LV SV MOD A2C:     53.5 ml LV SV MOD A4C:     80.0 ml LV SV MOD BP:      46.7 ml RIGHT VENTRICLE RV S prime:     6.53 cm/s TAPSE (M-mode): 1.2 cm Weston Brass MD Electronically signed by Weston Brass MD Signature Date/Time: 02/21/2023/11:12:26 AM    Final    CARDIAC CATHETERIZATION Result Date: 02/18/2023 HEMODYNAMICS: RA:   6 mmHg (mean) RV:   45/6-10 mmHg PA:   45/18 mmHg (29 mean) PCWP:  17 mmHg (mean)    Estimated Fick CO/CI   6 L/min, 3 L/min/m2 Thermodilution CO/CI   5.6 L/min, 2.8 L/min/m2    TPG    12  mmHg     PVR     2-2.1 Wood Units PAPi      4.5  IMPRESSION: Normal to mildly elevated pre and post capillary filling pressures  Normal cardiac output & index Aditya Sabharwal 11:46 AM  DG CHEST PORT 1 VIEW Result Date: 02/16/2023 CLINICAL DATA:  Shortness of breath EXAM: PORTABLE CHEST 1 VIEW COMPARISON:  02/15/2023 FINDINGS: Cardiac shadow is enlarged but stable. Right PICC is again seen and stable. Improved aeration is noted bilaterally although persistent basilar opacities are seen. No bony abnormality is noted. IMPRESSION: Persistent but improved basilar opacities when compared with the previous day. Electronically Signed   By: Alcide Clever M.D.   On: 02/16/2023 10:03   DG CHEST PORT 1 VIEW Result Date: 02/15/2023 CLINICAL DATA:  63 year old male with  history of shortness of breath and tachycardia. EXAM: PORTABLE CHEST 1 VIEW COMPARISON:  Chest x-ray 02/11/2023. FINDINGS: There is a right upper extremity PICC with tip terminating in the superior cavoatrial junction. Lung volumes are very low. Patchy multifocal interstitial prominence an ill-defined airspace disease in the lungs bilaterally, most confluent in the left mid to lower lung. Small bilateral pleural effusions. No pneumothorax. Pulmonary vasculature does not appear engorged. Heart size appears mildly enlarged. The patient is rotated to the right on today's exam, resulting in distortion of the mediastinal contours and reduced diagnostic sensitivity and specificity for mediastinal pathology. IMPRESSION: 1. Support apparatus, as above. 2. The appearance of the chest is concerning for multilobar bilateral pneumonia, most confluent in the left mid to lower lung. 3. Small bilateral pleural effusions. 4. Mild cardiomegaly. Electronically Signed   By: Trudie Reed M.D.   On: 02/15/2023 07:15   [place] EKG SITE RITE Result Date: 02/12/2023 If [facility] image not attached, placement could not be confirmed due to current cardiac rhythm.  ECHO TEE Result Date: 02/11/2023    TRANSESOPHOGEAL ECHO REPORT   Patient Name:   JAEVION GOTO Date of Exam: 02/11/2023 Medical Rec #:  [id number]        Height:       70.0 in Accession #:    [id number]       Weight:       198.2 lb Date of Birth:  1960-10-16        BSA:          2.079 m Patient Age:    63 years         BP:           91/71 mmHg Patient Gender: M                HR:           104 bpm. Exam Location:  Inpatient Procedure: Transesophageal Echo, Color Doppler and Cardiac Doppler Indications:    Cardioversion  History:        Patient has prior history of Echocardiogram examinations, most                 recent 02/09/2023.  Sonographer:    Harriette Bouillon RDCS Referring Phys: [phone] MARY E BRANCH IMPRESSIONS  1. Left ventricular ejection fraction, by estimation, is 30 to  35%. The left ventricle has moderately decreased function.  2. No left atrial/left atrial appendage thrombus was detected.  3. Mild mitral valve regurgitation.  4. Tricuspid valve regurgitation is mild to moderate.  5. Aortic valve regurgitation is trivial.  6. There is a moderately sized patent foramen ovale with predominantly right to left shunting across the atrial septum. Conclusion(s)/Recommendation(s): No LA/LAA thrombus identified. Negative bubble study for interatrial shunt. No intracardiac source of embolism detected on this on this transesophageal echocardiogram. FINDINGS  Left Ventricle: Left ventricular ejection  fraction, by estimation, is 30 to 35%. The left ventricle has moderately decreased function. Left Atrium: No left atrial/left atrial appendage thrombus was detected. Mitral Valve: Mild mitral valve regurgitation. Tricuspid Valve: Tricuspid valve regurgitation is mild to moderate. Aortic Valve: Aortic valve regurgitation is trivial. Pulmonic Valve: Pulmonic valve regurgitation is trivial. IAS/Shunts: A moderately sized patent foramen ovale is detected with predominantly right to left shunting across the atrial septum. Carolan Clines Electronically signed by Carolan Clines Signature Date/Time: 02/11/2023/11:32:41 AM    Final    EP STUDY Result Date: 02/11/2023 See surgical note for result.  DG CHEST PORT 1 VIEW Result Date: 02/11/2023 CLINICAL DATA:  Shortness of breath. EXAM: PORTABLE CHEST 1 VIEW COMPARISON:  Radiographs 02/08/2023 and 01/11/2023.  CT 12/31/2022. FINDINGS: 0602 hours. Persistent low lung volumes. The heart size and mediastinal contours are stable. No significant change in small bilateral pleural effusions and associated bibasilar airspace opacities. No evidence of pneumothorax or acute osseous abnormality. Telemetry leads overlie the chest. IMPRESSION: No significant change in small bilateral pleural effusions and associated bibasilar airspace opacities. Electronically Signed   By:  Carey Bullocks M.D.   On: 02/11/2023 10:02   ECHOCARDIOGRAM COMPLETE Result Date: 02/09/2023    ECHOCARDIOGRAM REPORT   Patient Name:   SHAIDEN ALDOUS Date of Exam: 02/09/2023 Medical Rec #:  [id number]        Height:       70.0 in Accession #:    [id number]       Weight:       198.0 lb Date of Birth:  1960/04/02        BSA:          2.078 m Patient Age:    63 years         BP:           113/69 mmHg Patient Gender: M                HR:           120 bpm. Exam Location:  Inpatient Procedure: 2D Echo, Cardiac Doppler, Color Doppler and Intracardiac            Opacification Agent Indications:    Congestive Heart Failure  History:        Patient has prior history of Echocardiogram examinations, most                 recent 12/31/2022. Risk Factors:Hypertension and Former Smoker.  Sonographer:    Karma Ganja Referring Phys: [id number] ANASTASSIA DOUTOVA  Sonographer Comments: Technically challenging study due to limited acoustic windows. Image acquisition challenging due to patient body habitus. IMPRESSIONS  1. Left ventricular ejection fraction, by estimation, is 30 to 35%. The left ventricle has moderately decreased function. The left ventricle demonstrates global hypokinesis. Left ventricular diastolic function could not be evaluated.  2. Right ventricular systolic function is moderately reduced. The right ventricular size is moderately enlarged. Tricuspid regurgitation signal is inadequate for assessing PA pressure.  3. Left atrial size was moderately dilated.  4. Right atrial size was moderately dilated.  5. The mitral valve is normal in structure. Mild to moderate mitral valve regurgitation. No evidence of mitral stenosis.  6. The aortic valve is tricuspid. Aortic valve regurgitation is not visualized. No aortic stenosis is present. Comparison(s): Prior images reviewed side by side. The left ventricular function is worsened. FINDINGS  Left Ventricle: Mobile echodensity at the apex appears to be a false tendon. Left  ventricular ejection fraction, by  estimation, is 30 to 35%. The left ventricle has moderately decreased function. The left ventricle demonstrates global hypokinesis. Definity contrast agent was given IV to delineate the left ventricular endocardial borders. The left ventricular internal cavity size was normal in size. There is borderline concentric left ventricular hypertrophy. Left ventricular diastolic function could not be evaluated due to atrial fibrillation. Left ventricular diastolic function could not be evaluated. Right Ventricle: The right ventricular size is moderately enlarged. Right vetricular wall thickness was not well visualized. Right ventricular systolic function is moderately reduced. Tricuspid regurgitation signal is inadequate for assessing PA pressure. Left Atrium: Left atrial size was moderately dilated. Right Atrium: Right atrial size was moderately dilated. Pericardium: There is no evidence of pericardial effusion. Mitral Valve: The mitral valve is normal in structure. Mild to moderate mitral valve regurgitation, with centrally-directed jet. No evidence of mitral valve stenosis. MV peak gradient, 6.9 mmHg. The mean mitral valve gradient is 3.7 mmHg. Tricuspid Valve: The tricuspid valve is grossly normal. Tricuspid valve regurgitation is not demonstrated. Aortic Valve: The aortic valve is tricuspid. Aortic valve regurgitation is not visualized. No aortic stenosis is present. Aortic valve mean gradient measures 3.0 mmHg. Aortic valve peak gradient measures 4.6 mmHg. Aortic valve area, by VTI measures 2.04 cm. Pulmonic Valve: The pulmonic valve was not well visualized. Pulmonic valve regurgitation is not visualized. No evidence of pulmonic stenosis. Aorta: The aortic root is normal in size and structure. IAS/Shunts: The interatrial septum was not well visualized.  LEFT VENTRICLE PLAX 2D LVIDd:         4.30 cm     Diastology LVIDs:         3.60 cm     LV e' medial:    5.51 cm/s LV PW:          1.10 cm     LV E/e' medial:  23.1 LV IVS:        1.20 cm     LV e' lateral:   9.29 cm/s LVOT diam:     1.90 cm     LV E/e' lateral: 13.7 LV SV:         29 LV SV Index:   14 LVOT Area:     2.84 cm  LV Volumes (MOD) LV vol d, MOD A2C: 98.5 ml LV vol d, MOD A4C: 70.6 ml LV vol s, MOD A2C: 62.5 ml LV vol s, MOD A4C: 51.3 ml LV SV MOD A2C:     36.0 ml LV SV MOD A4C:     70.6 ml LV SV MOD BP:      26.5 ml RIGHT VENTRICLE            IVC RV Basal diam:  4.50 cm    IVC diam: 2.30 cm RV S prime:     8.37 cm/s LEFT ATRIUM              Index        RIGHT ATRIUM           Index LA diam:        4.20 cm  2.02 cm/m   RA Area:     22.20 cm LA Vol (A2C):   117.0 ml 56.29 ml/m  RA Volume:   70.70 ml  34.02 ml/m LA Vol (A4C):   55.2 ml  26.56 ml/m LA Biplane Vol: 81.1 ml  39.02 ml/m  AORTIC VALVE AV Area (Vmax):    1.89 cm AV Area (Vmean):   1.80 cm AV Area (VTI):  2.04 cm AV Vmax:           107.43 cm/s AV Vmean:          79.233 cm/s AV VTI:            0.141 m AV Peak Grad:      4.6 mmHg AV Mean Grad:      3.0 mmHg LVOT Vmax:         71.57 cm/s LVOT Vmean:        50.200 cm/s LVOT VTI:          0.102 m LVOT/AV VTI ratio: 0.72  AORTA Ao Root diam: 3.50 cm MITRAL VALVE MV Area (PHT): 5.32 cm     SHUNTS MV Area VTI:   1.80 cm     Systemic VTI:  0.10 m MV Peak grad:  6.9 mmHg     Systemic Diam: 1.90 cm MV Mean grad:  3.7 mmHg MV Vmax:       1.32 m/s MV Vmean:      88.5 cm/s MV Decel Time: 143 msec MR Peak grad: 81.5 mmHg MR Vmax:      451.33 cm/s MV E velocity: 127.33 cm/s Thurmon Fair MD Electronically signed by Thurmon Fair MD Signature Date/Time: 02/09/2023/10:31:05 AM    Final    US Abdomen Complete Result Date: 02/08/2023 CLINICAL DATA:  [id number] with acute kidney injury. [id number].  Anasarca. History of liver disease by prior abdominal ultrasound order. EXAM: ABDOMEN ULTRASOUND COMPLETE COMPARISON:  Right upper quadrant ultrasound 01/04/2023 FINDINGS: Gallbladder: There is a mixture of sludge and tiny stones layering  within the gallbladder, as before. There is thickening of the free wall which measures 6 mm today, previously 5.4 mm which was not associated with a positive sonographic Murphy's sign. This could be due to chronic liver disease, passive wall congestion, reactive wall thickening or chronic cholecystitis. Common bile duct: Diameter: 3.4 mm. No intrahepatic biliary dilatation. Liver: No focal lesion identified. Within normal limits in parenchymal echogenicity. The liver is mildly prominent measuring 20 cm in length. Some images suggest slight surface lobulation in the left lobe which may be seen with cirrhosis. Portal vein is patent on color Doppler imaging with normal direction of blood flow towards the liver. The hepatic portal main vein is mildly prominent at 15 mm. IVC: Widely patent. Pancreas: Only the neck and body sections are visible. The head and tail, uncinate process obscured by bowel gas. The visualized portions unremarkable. Spleen: Obscured by bowel gas.  Not seen. Right Kidney: Length: 8.9 cm. Echogenicity of the renal cortex is mildly increased relative to liver. No mass, stones or hydronephrosis visualized. Left Kidney: Length: 9.9 cm. Echogenicity within normal limits. No mass, stones or hydronephrosis visualized. Abdominal aorta: No aneurysm visualized. Other findings: Mild perihepatic ascites, slightly greater than previously. IMPRESSION: 1. Mild perihepatic ascites, slightly greater than previously. 2. Mildly prominent liver with slight surface lobulation in the left lobe which may be seen with cirrhosis. 3. Mildly prominent hepatic portal main vein at 15 mm. No portal flow reversal. 4. Sludge and tiny stones in the gallbladder with thickening of the free wall which was not associated with a positive sonographic Murphy's sign. This could be due to chronic liver disease, passive wall congestion, reactive wall thickening or chronic cholecystitis. Similar wall thickening was noted on 01/04/2023. 5.  Mildly increased echogenicity of the right renal cortex relative to liver. This can be seen with medical renal disease. Left kidney is sonographically unremarkable. 6. Spleen not seen. 7. Limited  visualization of the pancreas, unremarkable where visible. Electronically Signed   By: Almira Bar M.D.   On: 02/08/2023 23:16   DG Chest Port 1 View Result Date: 02/08/2023 CLINICAL DATA:  Shortness of breath today EXAM: PORTABLE CHEST 1 VIEW COMPARISON:  01/11/2023 FINDINGS: Shallow inspiration with atelectasis in the lung bases. Cardiac enlargement. Small right pleural effusion. No vascular congestion or edema. No pneumothorax. Mediastinal contours appear intact. Degenerative changes in the spine. IMPRESSION: Shallow inspiration with atelectasis in the lung bases. Cardiac enlargement. Small right pleural effusion. Electronically Signed   By: Burman Nieves M.D.   On: 02/08/2023 18:32     Discharge Exam: Vitals:   02/22/23 0005 02/22/23 0531  BP: 109/80 98/72  Pulse: 80 91  Resp: 16 20  Temp: 98 F (36.7 C) 98.2 F (36.8 C)  SpO2: 97% 96%   Vitals:   02/21/23 1612 02/21/23 2233 02/22/23 0005 02/22/23 0531  BP: 113/79 106/75 109/80 98/72  Pulse: 80 79 80 91  Resp: 16 16 16 20   Temp: 98.2 F (36.8 C) 98.4 F (36.9 C) 98 F (36.7 C) 98.2 F (36.8 C)  TempSrc: Oral Oral Oral Oral  SpO2: 99% 96% 97% 96%  Weight:    81.4 kg  Height:        General: Pt is alert, awake, not in acute distress Cardiovascular: RRR, S1/S2 +, no rubs, no gallops Respiratory: CTA bilaterally, no wheezing, no rhonchi Abdominal: Soft, NT, ND, bowel sounds + Extremities: no edema, no cyanosis    The results of significant diagnostics from this hospitalization (including imaging, microbiology, ancillary and laboratory) are listed below for reference.     Microbiology: Recent Results (from the past 240 hours)  MRSA Next Gen by PCR, Nasal     Status: None   Collection Time: 02/17/23  9:30 AM   Specimen:  Nasal Mucosa; Nasal Swab  Result Value Ref Range Status   MRSA by PCR Next Gen NOT DETECTED NOT DETECTED Final    Comment: (NOTE) The GeneXpert MRSA Assay (FDA approved for NASAL specimens only), is one component of a comprehensive MRSA colonization surveillance program. It is not intended to diagnose MRSA infection nor to guide or monitor treatment for MRSA infections. Test performance is not FDA approved in patients less than 74 years old. Performed at [facility], [address] [place], [place] [address]      Labs: BNP (last 3 results) Recent Labs    02/08/23 1639 02/15/23 0409 02/16/23 0500  BNP 570.9* 964.6* 943.3*   Basic Metabolic Panel: Recent Labs  Lab 02/16/23 0420 02/17/23 0528 02/18/23 0927 02/18/23 1137 02/19/23 0605 02/20/23 0355 02/21/23 0418 02/22/23 0425  NA 136 129* 132* 133*  133* 131* 132* 136 136  K 3.6 4.1 4.3 4.4  4.4 4.0 4.3 4.3 4.7  CL 85* 80* 91*  --  89* 90* 92* 92*  CO2 36* 33* 34*  --  32 31 32 31  GLUCOSE 112* 269* 94  --  100* 105* 111* 95  BUN 21 18 19   --  26* 30* 29* 29*  CREATININE 1.35* 1.15 1.13  --  1.45* 1.31* 1.34* 1.48*  CALCIUM 8.6* 8.6* 8.3*  --  8.3* 8.5* 8.8* 9.0  MG 2.2 2.0  --   --   --   --   --   --   PHOS 4.1  --   --   --   --   --   --   --  Liver Function Tests: Recent Labs  Lab 02/18/23 0927 02/19/23 0605 02/20/23 0355 02/21/23 0418 02/22/23 0425  AST 26 27 29  38 36  ALT 12 14 15 18 18   ALKPHOS 53 54 57 65 65  BILITOT 1.2 1.0 0.6 0.7 0.6  PROT 6.3* 6.4* 6.4* 6.5 7.0  ALBUMIN 2.2* 2.2* 2.3* 2.4* 2.6*   No results for input(s): "LIPASE", "AMYLASE" in the last 168 hours. No results for input(s): "AMMONIA" in the last 168 hours. CBC: Recent Labs  Lab 02/16/23 0420 02/17/23 0528 02/18/23 0927 02/18/23 1137 02/20/23 1208  WBC 12.3* 11.1* 9.7  --  10.5  NEUTROABS 8.8*  --   --   --  8.6*  HGB 8.1* 7.7* 8.6* 9.9*  9.5* 7.9*  HCT 25.4* 24.5* 27.4* 29.0*  28.0* 25.1*  MCV 93.4 93.9  92.9  --  91.6  PLT 409* 442* 522*  --  569*   Cardiac Enzymes: No results for input(s): "CKTOTAL", "CKMB", "CKMBINDEX", "TROPONINI" in the last 168 hours. BNP: Invalid input(s): "POCBNP" CBG: No results for input(s): "GLUCAP" in the last 168 hours. D-Dimer No results for input(s): "DDIMER" in the last 72 hours. Hgb A1c No results for input(s): "HGBA1C" in the last 72 hours. Lipid Profile No results for input(s): "CHOL", "HDL", "LDLCALC", "TRIG", "CHOLHDL", "LDLDIRECT" in the last 72 hours. Thyroid function studies No results for input(s): "TSH", "T4TOTAL", "T3FREE", "THYROIDAB" in the last 72 hours.  Invalid input(s): "FREET3" Anemia work up No results for input(s): "VITAMINB12", "FOLATE", "FERRITIN", "TIBC", "IRON", "RETICCTPCT" in the last 72 hours. Urinalysis    Component Value Date/Time   COLORURINE YELLOW 02/16/2023 0935   APPEARANCEUR CLEAR 02/16/2023 0935   LABSPEC 1.020 02/16/2023 0935   PHURINE 5.0 02/16/2023 0935   GLUCOSEU NEGATIVE 02/16/2023 0935   HGBUR NEGATIVE 02/16/2023 0935   BILIRUBINUR NEGATIVE 02/16/2023 0935   KETONESUR NEGATIVE 02/16/2023 0935   PROTEINUR 30 (A) 02/16/2023 0935   NITRITE NEGATIVE 02/16/2023 0935   LEUKOCYTESUR NEGATIVE 02/16/2023 0935   Sepsis Labs Recent Labs  Lab 02/16/23 0420 02/17/23 0528 02/18/23 0927 02/20/23 1208  WBC 12.3* 11.1* 9.7 10.5   Microbiology Recent Results (from the past 240 hours)  MRSA Next Gen by PCR, Nasal     Status: None   Collection Time: 02/17/23  9:30 AM   Specimen: Nasal Mucosa; Nasal Swab  Result Value Ref Range Status   MRSA by PCR Next Gen NOT DETECTED NOT DETECTED Final    Comment: (NOTE) The GeneXpert MRSA Assay (FDA approved for NASAL specimens only), is one component of a comprehensive MRSA colonization surveillance program. It is not intended to diagnose MRSA infection nor to guide or monitor treatment for MRSA infections. Test performance is not FDA approved in patients less than 67  years old. Performed at [facility], [address], [place], [place] [address]     FURTHER DISCHARGE INSTRUCTIONS:   Get Medicines reviewed and adjusted: Please take all your medications with you for your next visit with your Primary MD   Laboratory/radiological data: Please request your Primary MD to go over all hospital tests and procedure/radiological results at the follow up, please ask your Primary MD to get all Hospital records sent to his/her office.   In some cases, they will be blood work, cultures and biopsy results pending at the time of your discharge. Please request that your primary care M.D. goes through all the records of your hospital data and follows up on these results.   Also Note the  following: If you experience worsening of your admission symptoms, develop shortness of breath, life threatening emergency, suicidal or homicidal thoughts you must seek medical attention immediately by calling 911 or calling your MD immediately  if symptoms less severe.   You must read complete instructions/literature along with all the possible adverse reactions/side effects for all the Medicines you take and that have been prescribed to you. Take any new Medicines after you have completely understood and accpet all the possible adverse reactions/side effects.    Do not drive when taking Pain medications or sleeping medications (Benzodaizepines)   Do not take more than prescribed Pain, Sleep and Anxiety Medications. It is not advisable to combine anxiety,sleep and pain medications without talking with your primary care practitioner   Special Instructions: If you have smoked or chewed Tobacco  in the last 2 yrs please stop smoking, stop any regular Alcohol  and or any Recreational drug use.   Wear Seat belts while driving.   Please note: You were cared for by a hospitalist during your hospital stay. Once you are discharged, your primary care physician will handle any further  medical issues. Please note that NO REFILLS for any discharge medications will be authorized once you are discharged, as it is imperative that you return to your primary care physician (or establish a relationship with a primary care physician if you do not have one) for your post hospital discharge needs so that they can reassess your need for medications and monitor your lab values  Time coordinating discharge: Over 30 minutes  SIGNED:   Hughie Closs, MD  [facility] 02/22/2023, 10:02 AM *Please note that this is a verbal dictation therefore any spelling or grammatical errors are due to the "Dragon Medical One" system interpretation. If 7PM-7AM, please contact night-coverage [email or web address]

## 2023-02-22 NOTE — Plan of Care (Signed)
  Problem: Education: Goal: Knowledge of General Education information will improve Description: Including pain rating scale, medication(s)/side effects and non-pharmacologic comfort measures Outcome: Progressing   Problem: Health Behavior/Discharge Planning: Goal: Ability to manage health-related needs will improve Outcome: Progressing   Problem: Clinical Measurements: Goal: Ability to maintain clinical measurements within normal limits will improve Outcome: Progressing Goal: Will remain free from infection Outcome: Progressing Goal: Diagnostic test results will improve Outcome: Progressing   Problem: Activity: Goal: Risk for activity intolerance will decrease Outcome: Progressing   Problem: Nutrition: Goal: Adequate nutrition will be maintained Outcome: Progressing   Problem: Coping: Goal: Level of anxiety will decrease Outcome: Progressing   Problem: Pain Managment: Goal: General experience of comfort will improve and/or be controlled Outcome: Progressing   Problem: Safety: Goal: Ability to remain free from injury will improve Outcome: Progressing

## 2023-02-23 ENCOUNTER — Ambulatory Visit: Payer: Medicare Other | Admitting: Family Medicine

## 2023-02-23 DIAGNOSIS — Z8679 Personal history of other diseases of the circulatory system: Secondary | ICD-10-CM | POA: Insufficient documentation

## 2023-02-23 DIAGNOSIS — N1831 Chronic kidney disease, stage 3a: Secondary | ICD-10-CM | POA: Insufficient documentation

## 2023-02-25 NOTE — Progress Notes (Addendum)
 HEART & VASCULAR TRANSITION OF CARE CONSULT NOTE     Referring Physician: Dr. Jacqulyn Bath PCP: Kurtis Bushman, NP Cardiologist: Dr. Rennis Golden   Chief Complaint: Chronic HFrEF  HPI: Referred to clinic by Dr. Jacqulyn Bath for heart failure consultation. 63 y.o. male with history of alcohol abuse, tobacco use, atrial fibrillation, anxiety/depression, HTN, GERD.   Admitted in 12/24 with alcohol intoxication and Afib with RVR. Echo at that time with EF 55-60%. He was started on amiodarone and digoxin. Underwent workup for anemia and found to have gastritis on EGD. Refused colonoscopy.  He was readmitted 02/08/23 with Afib with RVR, acute CHF and fever. Had not been compliant with home medications. He was positive for coronaviurs OC43 and also treated for CAP. Echo with EF 30-35%, moderately reduced RV, moderate BAE. Loaded with IV amiodarone and underwent TEE/DCCV to SR. Concern for low-output HF w/ drop in urine output and rising Scr with attempts to diurese. PICC line placed, started on milrinone with initial CO-OX of 54% and Fick CI of 2.3 L/min/m2. Midodrine added to support blood pressure. Once volume optimized milrinone weaned off. RHC 02/18/23: normal to mildly elevated filling pressures and preserved Fick and TD CI. GDMT remained limited by BP. Discharged to SNF for rehab on 02/17.  Here today for hospital CHF follow-up. He is accompanied by his sister who assists with providing the history. He was discharged from rehab on 02/20. He is now living with another one of his sisters. His family assists him with medications and preparation of meals. His sister tries to cook with very little salt. Denies dyspnea, orthopnea or PND. Has a little bit of lower extremity edema. His weight at discharge from rehab was 177 lb, he is 176 lb today. Pushed wheelchair walking into clinic. Denies ETOH use since discharge. Taking all medications correctly except midodrine (taking 5 mg TID instead of 15 mg TID).   Past  Medical History:  Diagnosis Date   Alcohol abuse    Rehab 2016   Anxiety    Atrial fibrillation (HCC)    Depression    GERD (gastroesophageal reflux disease)    Gout    Headache    Hypertension     Current Outpatient Medications  Medication Sig Dispense Refill   acetaminophen (TYLENOL) 500 MG tablet Take 500 mg by mouth every 6 (six) hours as needed for moderate pain (pain score 4-6).     allopurinol (ZYLOPRIM) 100 MG tablet Take 100 mg by mouth daily.     amiodarone (PACERONE) 200 MG tablet Take 1 tablet (200 mg total) by mouth 2 (two) times daily for 2 days, THEN 1 tablet (200 mg total) daily. 34 tablet 0   apixaban (ELIQUIS) 5 MG TABS tablet Take 1 tablet (5 mg total) by mouth 2 (two) times daily. 60 tablet 3   cyanocobalamin (VITAMIN B12) 500 MCG tablet Take 1 tablet (500 mcg total) by mouth daily.     dapagliflozin propanediol (FARXIGA) 10 MG TABS tablet Take 1 tablet (10 mg total) by mouth daily before breakfast. 30 tablet 3   ferrous sulfate 325 (65 FE) MG tablet Take 1 tablet (325 mg total) by mouth 2 (two) times daily with a meal. 60 tablet 0   folic acid (FOLVITE) 1 MG tablet Take 1 tablet (1 mg total) by mouth daily.     furosemide (LASIX) 40 MG tablet Take 40 mg by mouth daily.     pantoprazole (PROTONIX) 40 MG tablet Take 1 tablet (40 mg total) by  mouth 2 (two) times daily.     potassium chloride SA (KLOR-CON M) 20 MEQ tablet Take 1 tablet (20 mEq total) by mouth daily.     No current facility-administered medications for this encounter.    Allergies  Allergen Reactions   Chlorhexidine Gluconate Rash      Social History   Socioeconomic History   Marital status: Single    Spouse name: Not on file   Number of children: 0   Years of education: Not on file   Highest education level: High school graduate  Occupational History   Occupation: retired  Tobacco Use   Smoking status: Former    Types: Cigars   Smokeless tobacco: Never   Tobacco comments:    quit x  5 months  Vaping Use   Vaping status: Never Used  Substance and Sexual Activity   Alcohol use: Yes    Alcohol/week: 2.0 - 3.0 standard drinks of alcohol    Types: 2 - 3 Shots of liquor per week    Comment: 2-3 shots liquor each evening   Drug use: No   Sexual activity: Not Currently  Other Topics Concern   Not on file  Social History Narrative   Not on file   Social Drivers of Health   Financial Resource Strain: Low Risk  (02/19/2023)   Overall Financial Resource Strain (CARDIA)    Difficulty of Paying Living Expenses: Not very hard  Food Insecurity: No Food Insecurity (02/08/2023)   Hunger Vital Sign    Worried About Running Out of Food in the Last Year: Never true    Ran Out of Food in the Last Year: Never true  Transportation Needs: No Transportation Needs (02/19/2023)   PRAPARE - Administrator, Civil Service (Medical): No    Lack of Transportation (Non-Medical): No  Physical Activity: Not on file  Stress: Not on file  Social Connections: Socially Isolated (02/08/2023)   Social Connection and Isolation Panel [NHANES]    Frequency of Communication with Friends and Family: More than three times a week    Frequency of Social Gatherings with Friends and Family: Never    Attends Religious Services: Never    Database administrator or Organizations: No    Attends Banker Meetings: Never    Marital Status: Never married  Intimate Partner Violence: Not At Risk (02/08/2023)   Humiliation, Afraid, Rape, and Kick questionnaire    Fear of Current or Ex-Partner: No    Emotionally Abused: No    Physically Abused: No    Sexually Abused: No      Family History  Problem Relation Age of Onset   Colon cancer Maternal Uncle     Vitals:   03/01/23 0846  BP: 112/80  Pulse: 82  SpO2: 98%  Weight: 79.8 kg (176 lb)    PHYSICAL EXAM: General:  Well appearing. Sister is present. Neck: no JVD.  Cor: PMI nondisplaced. Regular rate & rhythm. No rubs, gallops or  murmurs. Lungs: clear Abdomen: soft, nontender, nondistended.  Extremities: 1+ edema Neuro: alert & oriented x 3. Affect pleasant.   ASSESSMENT & PLAN: Chronic HFrEF -EF preserved on echo 12/24 -Echo 02/24: EF 30-35%, moderately reduced RV, moderate BAE -RHC 02/18/23 off milrinone: normal to mildly elevated filling pressures and preserved Fick and TD CI -Suspect tachymediated cardiomyoapthy in setting of atrial fibrillation with RVR, ETOH abuse, +/- viral illness (had Coronavirus OC43). Would pursue ischemic workup +/- cMRI if EF does not improve with maintenance  of SR -NYHA III, confounded by physical deconditioning. Volume mildly up today. -Add SGLT2i today which will help with volume, [place] or [place] pending insurance  -GDMT limited by hypotension. BP stable. Stop midodrine. -Consider starting low dose ARB at follow-up. -Repeat echo to reassess LV function and EF in 3 months  2. Paroxysmal atrial fibrillation -Diagnosed 12/24 -S/p DCCV to SR in 02/5 -ECG today with SR -Continue amiodarone 200 mg daily. Recent TSH was normal. -Continue Eliquis 5 mg BID. Denies melena. See # 4.    3. ETOH abuse -Reports he has not consumed alcohol since hospital discharge -He was congratulated on this. Recommended he continue to refrain from alcohol.  4. Chronic anemia -EGD in 01/25 with gastritis. Declined colonoscopy at that time. -Hgb 7.9 on 02/15. Denies dark stools or melena but also on po iron.  -Check CBC today.   Referred to HFSW (PCP, Medications, Transportation, ETOH Abuse, Drug Abuse, Insurance, Financial ):No Refer to Pharmacy:No Refer to Home Health:No Refer to Advanced Heart Failure Clinic: Pending follow-up visit Refer to General Cardiology: No, already has appointment  Follow up  Cardiology as scheduled 03/15/23, f/u in TOC 4 weeks to titrate GDMT and assess volume.

## 2023-03-01 ENCOUNTER — Other Ambulatory Visit (HOSPITAL_COMMUNITY): Payer: Self-pay

## 2023-03-01 ENCOUNTER — Encounter (HOSPITAL_COMMUNITY): Payer: Self-pay

## 2023-03-01 ENCOUNTER — Telehealth: Payer: Self-pay

## 2023-03-01 ENCOUNTER — Ambulatory Visit (HOSPITAL_COMMUNITY)
Admit: 2023-03-01 | Discharge: 2023-03-01 | Disposition: A | Discharge status: Home / Self Care | Payer: Medicare Other | Attending: Physician Assistant

## 2023-03-01 ENCOUNTER — Telehealth (HOSPITAL_COMMUNITY): Payer: Self-pay

## 2023-03-01 VITALS — BP 112/80 | HR 82 | Wt 176.0 lb

## 2023-03-01 DIAGNOSIS — Z79899 Other long term (current) drug therapy: Secondary | ICD-10-CM | POA: Diagnosis not present

## 2023-03-01 DIAGNOSIS — Z8719 Personal history of other diseases of the digestive system: Secondary | ICD-10-CM | POA: Insufficient documentation

## 2023-03-01 DIAGNOSIS — Z91148 Patient's other noncompliance with medication regimen for other reason: Secondary | ICD-10-CM | POA: Insufficient documentation

## 2023-03-01 DIAGNOSIS — F1010 Alcohol abuse, uncomplicated: Secondary | ICD-10-CM | POA: Diagnosis not present

## 2023-03-01 DIAGNOSIS — I110 Hypertensive heart disease with heart failure: Secondary | ICD-10-CM | POA: Insufficient documentation

## 2023-03-01 DIAGNOSIS — Z87891 Personal history of nicotine dependence: Secondary | ICD-10-CM | POA: Diagnosis not present

## 2023-03-01 DIAGNOSIS — K219 Gastro-esophageal reflux disease without esophagitis: Secondary | ICD-10-CM | POA: Diagnosis not present

## 2023-03-01 DIAGNOSIS — I5020 Unspecified systolic (congestive) heart failure: Secondary | ICD-10-CM

## 2023-03-01 DIAGNOSIS — I480 Paroxysmal atrial fibrillation: Secondary | ICD-10-CM | POA: Diagnosis not present

## 2023-03-01 DIAGNOSIS — D649 Anemia, unspecified: Secondary | ICD-10-CM | POA: Diagnosis not present

## 2023-03-01 DIAGNOSIS — I491 Atrial premature depolarization: Secondary | ICD-10-CM | POA: Diagnosis not present

## 2023-03-01 DIAGNOSIS — I959 Hypotension, unspecified: Secondary | ICD-10-CM | POA: Diagnosis not present

## 2023-03-01 DIAGNOSIS — Z7984 Long term (current) use of oral hypoglycemic drugs: Secondary | ICD-10-CM | POA: Diagnosis not present

## 2023-03-01 DIAGNOSIS — Z7901 Long term (current) use of anticoagulants: Secondary | ICD-10-CM | POA: Diagnosis not present

## 2023-03-01 DIAGNOSIS — I5022 Chronic systolic (congestive) heart failure: Secondary | ICD-10-CM | POA: Diagnosis not present

## 2023-03-01 LAB — BRAIN NATRIURETIC PEPTIDE: B Natriuretic Peptide: 600.4 pg/mL — ABNORMAL HIGH (ref 0.0–100.0)

## 2023-03-01 LAB — BASIC METABOLIC PANEL
Anion gap: 12 (ref 5–15)
BUN: 20 mg/dL (ref 8–23)
CO2: 28 mmol/L (ref 22–32)
Calcium: 10 mg/dL (ref 8.9–10.3)
Chloride: 98 mmol/L (ref 98–111)
Creatinine, Ser: 1.45 mg/dL — ABNORMAL HIGH (ref 0.61–1.24)
GFR, Estimated: 53 mL/min — ABNORMAL LOW (ref >60)
Glucose, Bld: 90 mg/dL (ref 70–99)
Potassium: 4.7 mmol/L (ref 3.5–5.1)
Sodium: 138 mmol/L (ref 135–145)

## 2023-03-01 LAB — CBC
HCT: 33.2 % — ABNORMAL LOW (ref 39.0–52.0)
Hemoglobin: 10.3 g/dL — ABNORMAL LOW (ref 13.0–17.0)
MCH: 29.5 pg (ref 26.0–34.0)
MCHC: 31 g/dL (ref 30.0–36.0)
MCV: 95.1 fL (ref 80.0–100.0)
Platelets: 512 10*3/uL — ABNORMAL HIGH (ref 150–400)
RBC: 3.49 MIL/uL — ABNORMAL LOW (ref 4.22–5.81)
RDW: 19 % — ABNORMAL HIGH (ref 11.5–15.5)
WBC: 8.5 10*3/uL (ref 4.0–10.5)
nRBC: 0 % (ref 0.0–0.2)

## 2023-03-01 MED ORDER — DAPAGLIFLOZIN PROPANEDIOL 10 MG PO TABS: 10.0000 mg | ORAL_TABLET | Freq: Every day | ORAL | 3 refills | Status: DC

## 2023-03-01 MED ORDER — MIDODRINE HCL 5 MG PO TABS: 10.0000 mg | ORAL_TABLET | Freq: Three times a day (TID) | ORAL | 0 refills | Status: DC

## 2023-03-01 NOTE — Addendum Note (Signed)
 Encounter addended by: Nicole Cella, RN on: 03/01/2023 4:22 PM  Actions taken: Charge Capture section accepted

## 2023-03-01 NOTE — Telephone Encounter (Signed)
 Please call and schedule pt an office visit. Thank you

## 2023-03-01 NOTE — Patient Instructions (Addendum)
 Medication Changes:  STOP Midodrine   START Farxiga 10mg  (1 tab) daily  Lab Work:  You had lab work done today. We will contact you for any abnormal labs. Otherwise your results will show up in mychart.   Special Instructions // Education:  Do the following things EVERYDAY: Weigh yourself in the morning before breakfast. Write it down and keep it in a log. Take your medicines as prescribed Eat low salt foods--Limit salt (sodium) to 2000 mg per day.  Stay as active as you can everyday Limit all fluids for the day to less than 2 liters  PLEASE take your blood pressure and keep a log.   Follow-Up in:  Please follow up with the [facility] in 1 month.  At the Advanced Heart Failure Clinic, you and your health needs are our priority. We have a designated team specialized in the treatment of Heart Failure. This Care Team includes your primary Heart Failure Specialized Cardiologist (physician), Advanced Practice Providers (APPs- Physician Assistants and Nurse Practitioners), and Pharmacist who all work together to provide you with the care you need, when you need it.   You may see any of the following providers on your designated Care Team at your next follow up:  Dr. Arvilla Meres Dr. Marca Ancona Dr. Dorthula Nettles Dr. Theresia Bough Tonye Becket, NP Robbie Lis, [place] [facility] [place], [place] Brynda Peon, NP [place] Lee, NP Karle Plumber, PharmD   Please be sure to bring in all your medications bottles to every appointment.   Need to Contact us:  If you have any questions or concerns before your next appointment please send [place] a message through Center or call our office at [phone].    TO LEAVE A MESSAGE FOR THE NURSE SELECT OPTION 2, PLEASE LEAVE A MESSAGE INCLUDING: YOUR NAME DATE OF BIRTH CALL BACK NUMBER REASON FOR CALL**this is important as we prioritize the call backs  YOU WILL RECEIVE A CALL BACK THE SAME DAY AS LONG AS YOU CALL  BEFORE 4:00 PM

## 2023-03-01 NOTE — Telephone Encounter (Signed)
 Advanced Heart Failure Patient Advocate Encounter  Test billing for Marcelline Deist and London Pepper show that both medications would have a copay of $47 for 30 days, or $141 for 90 days.  Burnell Blanks, CPhT Rx Patient Advocate Phone: [phone]

## 2023-03-01 NOTE — Addendum Note (Signed)
 Encounter addended by: Andrey Farmer, PA-C on: 03/01/2023 7:33 PM  Actions taken: Clinical Note Signed

## 2023-03-01 NOTE — Transitions of Care (Post Inpatient/ED Visit) (Unsigned)
   03/01/2023  Name: Justin Fleming MRN: [id number] DOB: February 06, 1960  Today's TOC FU Call Status: Today's TOC FU Call Status:: Unsuccessful Call (1st Attempt) Unsuccessful Call (1st Attempt) Date: 03/01/23  Attempted to reach the patient regarding the most recent Inpatient/ED visit.  Follow Up Plan: Additional outreach attempts will be made to reach the patient to complete the Transitions of Care (Post Inpatient/ED visit) call.   Signature Karena Addison, LPN [facility] Nurse Health Advisor Direct Dial [phone]

## 2023-03-01 NOTE — Telephone Encounter (Signed)
 Pt had recent hospitalization and right heart cath. Does he need to come back in to office? Please advise. Thank you

## 2023-03-02 NOTE — Telephone Encounter (Signed)
 noted

## 2023-03-02 NOTE — Transitions of Care (Post Inpatient/ED Visit) (Unsigned)
   03/02/2023  Name: Justin Fleming MRN: [id number] DOB: [birth year]  Today's TOC FU Call Status: Today's TOC FU Call Status:: Unsuccessful Call (2nd Attempt) Unsuccessful Call (1st Attempt) Date: 03/01/23 Unsuccessful Call (2nd Attempt) Date: 03/02/23  Attempted to reach the patient regarding the most recent Inpatient/ED visit.  Follow Up Plan: Additional outreach attempts will be made to reach the patient to complete the Transitions of Care (Post Inpatient/ED visit) call.   Signature Karena Addison, LPN [facility] Nurse Health Advisor Direct Dial [phone]

## 2023-03-02 NOTE — Progress Notes (Signed)
 Cardiology Office Note:  .   Date:  03/15/2023  ID:  Justin Fleming, DOB March 28, 1960, MRN [id number] PCP: [name], FNP  [facility] Providers Cardiologist:  Nona Dell, MD    History of Present Illness: .   Justin Fleming is a 63 y.o. male with a history of persistent atrial fibrillation (on Amiodarone, Digoxin, and Eliquis), chronic HFpEF, hypotension on Midodrine, GERD and known gastritis with recent GI bleed in 01/2023, anxiety/ depression, and alcohol abuse   He was admitted on 02/08/2023 with atrial fibrillation with RVR and acute on chronic CHF. Echo showed a drop in EF to 30-35%.RHC   normal cardiac output and milrinone stopped. Plan for outpatient cardia MRI-not urgent but plan for LHC after 30 days from cardioversion. Repeat limited echo 02/21/23 normal LVEF and RV function may have improved by 1 grade.  Patient seen in AHF clinic 03/01/23 and plan was to repeat echo in 3 months and hold off on ischemic w/u until then, possibly MRI if EF doesn't improved. 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   Patient comes in for f/u. Stopped his iron because he read it turned his stolls black and he was worried he was bleeding. No chest pain, dyspnea, palpitations, dizziness, edema. Watching salt closely. No alcohol since hospital.   ROS:    Studies Reviewed: Marland Kitchen         Prior CV Studies:   Limited echo 02/21/23 IMPRESSIONS     1. Left ventricular ejection fraction, by estimation, is 60 to 65%. The  left ventricle has normal function. There is severe left ventricular  hypertrophy.   2. Right ventricular systolic function is mildly reduced. The right  ventricular size is normal.   3. The inferior vena cava is normal in size with <50% respiratory  variability, suggesting right atrial pressure of 8 mmHg.   Comparison(s): Prior images reviewed side by side. Changes from prior  study are noted. LVEF has improved. RV function may have improved by one  grade. RA pressure  estimate matches clinically obtained CVP today.    TTE 02/09/2023: Impressions: 1. Left ventricular ejection fraction, by estimation, is 30 to 35%. The  left ventricle has moderately decreased function. The left ventricle  demonstrates global hypokinesis. Left ventricular diastolic function could  not be evaluated.   2. Right ventricular systolic function is moderately reduced. The right  ventricular size is moderately enlarged. Tricuspid regurgitation signal is  inadequate for assessing PA pressure.   3. Left atrial size was moderately dilated.   4. Right atrial size was moderately dilated.   5. The mitral valve is normal in structure. Mild to moderate mitral valve  regurgitation. No evidence of mitral stenosis.   6. The aortic valve is tricuspid. Aortic valve regurgitation is not  visualized. No aortic stenosis is present.  _______________   TEE/ DCCV 02/11/2023: Impressions: 1. Left ventricular ejection fraction, by estimation, is 30 to 35%. The  left ventricle has moderately decreased function.   2. No left atrial/left atrial appendage thrombus was detected.   3. Mild mitral valve regurgitation.   4. Tricuspid valve regurgitation is mild to moderate.   5. Aortic valve regurgitation is trivial.   6. There is a moderately sized patent foramen ovale with predominantly  right to left shunting across the atrial septum.   Conclusion(s)/Recommendation(s): No LA/LAA thrombus identified. Negative  bubble study for interatrial shunt. No intracardiac source of embolism  detected on this on this transesophageal echocardiogram.  Risk Assessment/Calculations:       Physical Exam:   VS:  BP 104/64 (BP Location: Left Arm, Patient Position: Sitting, Cuff Size: Large)   Pulse 86   Ht 5\' 10"  (1.778 m)   Wt 174 lb (78.9 kg)   SpO2 97%   BMI 24.97 kg/m    Wt Readings from Last 3 Encounters:  03/15/23 174 lb (78.9 kg)  03/04/23 177 lb (80.3 kg)  03/01/23 176 lb (79.8 kg)    GEN:  Well nourished, well developed in no acute distress NECK: No JVD; No carotid bruits CARDIAC:  RRR, no murmurs, rubs, gallops RESPIRATORY:  Clear to auscultation without rales, wheezing or rhonchi  ABDOMEN: Soft, non-tender, non-distended EXTREMITIES:  No edema; No deformity   ASSESSMENT AND PLAN: .     HFrEF- needs ischemic w/u but was recently cardioverted and hesitant to hold eliquis. RHC normal cardiac output and milrinone stopped.  Repeat limited echo 02/21/23 normal LVEF and RV function may have improved by 1 grade. Follwed by AHF and plan is to repeat echo in 3 months. If no improvement pursue ischemic workup +/- MRI. BNP 600, Crt 1.45  03/01/23. -compensated today, weight down 2 lbs, continue current meds -f/u AHF 03/29/23  Afib s/p DCCV 02/10/23 on amio and eliquis-HR regular today  Hypotension was on midodrine but stopped at LOV. BP low but stable  Chronic anemia hgb 10.3 03/01/23 -patient stopped his iron due to dark stools and was worried he was bleeding. I told him it's normal to have dark stools while taking iron and asked him to restart it. He's agreeable.  Alcohol use-hasn't used since hospitalization.        Dispo: f/u with Dr. Diona Browner in 4 months.  Signed, Jacolyn Reedy, PA-C

## 2023-03-04 ENCOUNTER — Encounter: Payer: Self-pay | Admitting: Family Medicine

## 2023-03-04 ENCOUNTER — Ambulatory Visit: Payer: Medicare Other | Admitting: Family Medicine

## 2023-03-04 VITALS — BP 124/88 | HR 95 | Temp 97.7°F | Ht 70.0 in | Wt 177.0 lb

## 2023-03-04 DIAGNOSIS — D649 Anemia, unspecified: Secondary | ICD-10-CM | POA: Diagnosis not present

## 2023-03-04 DIAGNOSIS — I10 Essential (primary) hypertension: Secondary | ICD-10-CM

## 2023-03-04 DIAGNOSIS — I5033 Acute on chronic diastolic (congestive) heart failure: Secondary | ICD-10-CM

## 2023-03-04 DIAGNOSIS — N1831 Chronic kidney disease, stage 3a: Secondary | ICD-10-CM | POA: Diagnosis not present

## 2023-03-04 DIAGNOSIS — I4819 Other persistent atrial fibrillation: Secondary | ICD-10-CM

## 2023-03-04 DIAGNOSIS — M1A9XX0 Chronic gout, unspecified, without tophus (tophi): Secondary | ICD-10-CM

## 2023-03-04 DIAGNOSIS — K219 Gastro-esophageal reflux disease without esophagitis: Secondary | ICD-10-CM

## 2023-03-04 DIAGNOSIS — I4891 Unspecified atrial fibrillation: Secondary | ICD-10-CM | POA: Diagnosis not present

## 2023-03-04 DIAGNOSIS — Z1211 Encounter for screening for malignant neoplasm of colon: Secondary | ICD-10-CM

## 2023-03-04 NOTE — Assessment & Plan Note (Addendum)
 2/24 EF 30-35%. Continue Lasix 40mg  daily and Farxiga 10mg  daily. Unable to perform LHC during recent hospitalization due to inability to hold anticoagulant due to recent DCCV, plan for cardiac MRI outpatient. Mild bilateral ankle and pedal edema today. Plan for repeat ECHO with cardiology in 3 months and considering low dose ARB. Low salt diet and monitor weight and edema.
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
## 2023-03-04 NOTE — Assessment & Plan Note (Signed)
 Most recent creatinine 1.45 and GFR 53. Baseline Cr prior to hospitalization 1.07-1.23 without history of CKD. Cardiology considering ARB.

## 2023-03-04 NOTE — Assessment & Plan Note (Signed)
 Regular rate and rhythm today. Continue Amiodarone 200mg  daily and Eliquis 5mg  BID.

## 2023-03-04 NOTE — Assessment & Plan Note (Signed)
 Pt denies GERD symptoms for many years

## 2023-03-04 NOTE — Transitions of Care (Post Inpatient/ED Visit) (Signed)
   03/04/2023  Name: Justin Fleming MRN: [id number] DOB: 04/04/1960  Today's TOC FU Call Status: Today's TOC FU Call Status:: Unsuccessful Call (3rd Attempt) Unsuccessful Call (1st Attempt) Date: 03/01/23 Unsuccessful Call (2nd Attempt) Date: 03/02/23 Unsuccessful Call (3rd Attempt) Date: 03/04/23  Attempted to reach the patient regarding the most recent Inpatient/ED visit.  Follow Up Plan: No further outreach attempts will be made at this time. We have been unable to contact the patient.  Signature Karena Addison, LPN [facility] Nurse Health Advisor Direct Dial [phone]

## 2023-03-04 NOTE — Assessment & Plan Note (Signed)
 Post hospital Hgb 10.3. Continue Ferrous Sulfate. Colonoscopy ordered. Repeat CBC in 1 month.

## 2023-03-04 NOTE — Assessment & Plan Note (Signed)
 Well controlled on Allopurinol. Return to office for flares. Discussed pathophysiology of gout and low purine diet.

## 2023-03-10 ENCOUNTER — Telehealth: Payer: Self-pay

## 2023-03-10 NOTE — Telephone Encounter (Signed)
 Called LVM for pt to come to office to pick Dis park. Placard. It is placed up front in the draw.

## 2023-03-10 NOTE — Telephone Encounter (Signed)
 Copied from CRM [id number]. Topic: General - Other >> Mar 10, 2023  9:16 AM Elle L wrote: Reason for CRM: The patient is requesting to see if FNP Kurtis Bushman could fill out a disability placard form for him without coming in for an appointment as he forgot to bring it to his appointment on 2/27. The patient's call back number is [phone].

## 2023-03-15 ENCOUNTER — Encounter: Payer: Self-pay | Admitting: Physician Assistant

## 2023-03-15 ENCOUNTER — Ambulatory Visit: Payer: Medicare Other | Attending: Physician Assistant | Admitting: Physician Assistant

## 2023-03-15 VITALS — BP 104/64 | HR 86 | Ht 70.0 in | Wt 174.0 lb

## 2023-03-15 DIAGNOSIS — I480 Paroxysmal atrial fibrillation: Secondary | ICD-10-CM

## 2023-03-15 DIAGNOSIS — F1010 Alcohol abuse, uncomplicated: Secondary | ICD-10-CM

## 2023-03-15 DIAGNOSIS — I959 Hypotension, unspecified: Secondary | ICD-10-CM | POA: Diagnosis not present

## 2023-03-15 DIAGNOSIS — I5020 Unspecified systolic (congestive) heart failure: Secondary | ICD-10-CM | POA: Diagnosis not present

## 2023-03-15 DIAGNOSIS — D649 Anemia, unspecified: Secondary | ICD-10-CM | POA: Diagnosis not present

## 2023-03-15 NOTE — Patient Instructions (Addendum)
 Medication Instructions:  Your physician recommends that you continue on your current medications as directed. Please refer to the Current Medication list given to you today. Restart your iron pill  Labwork: none  Testing/Procedures: none  Follow-Up: Your physician recommends that you schedule a follow-up appointment in: 4 months with Diona Browner.  Any Other Special Instructions Will Be Listed Below (If Applicable).  If you need a refill on your cardiac medications before your next appointment, please call your pharmacy.

## 2023-03-16 ENCOUNTER — Encounter (INDEPENDENT_AMBULATORY_CARE_PROVIDER_SITE_OTHER): Payer: Self-pay | Admitting: *Deleted

## 2023-03-26 ENCOUNTER — Telehealth (HOSPITAL_COMMUNITY): Payer: Self-pay

## 2023-03-26 NOTE — Telephone Encounter (Signed)
 Called to confirm/remind patient of their appointment at [facility] on 03/29/2023 8:45.   Appointment:   [] Confirmed  [x] Left mess   [] No answer/No voice mail  [] Phone not in service  Patient reminded to bring all medications and/or complete list.  Confirmed patient has transportation. Gave directions, instructed to utilize valet parking.

## 2023-03-28 NOTE — Progress Notes (Signed)
 HEART & VASCULAR TRANSITION OF CARE NOTE   PCP: Kurtis Bushman, NP Cardiologist: Dr. Diona Browner  HPI: 63 y.o. male with history of alcohol abuse, tobacco use, atrial fibrillation, anxiety/depression, HTN, GERD.   Admitted in 12/24 with alcohol intoxication and Afib with RVR. Echo at that time with EF 55-60%. He was started on amiodarone and digoxin. Underwent workup for anemia, and found to have gastritis on EGD. Refused colonoscopy.  He was readmitted 2/25 with Afib with RVR, a/c HF and fever. Had not been compliant with home medications. He was positive for coronaviurs OC43 and also treated for CAP. Echo with EF 30-35%, moderately reduced RV, moderate BAE. Loaded with IV amiodarone and underwent TEE/DCCV to SR. Concern for low-output HF, PICC line placed, started on milrinone with initial CO-OX of 54%. Midodrine added to support blood pressure. Once volume optimized milrinone weaned off. RHC showed normal to mildly elevated filling pressures and preserved Fick and TD CI. Ltd echo with maintenance of SR showed EF improved to 60-65%. GDMT remained limited by BP. Discharged to SNF.  Today he returns to [facility] for HF follow up with his sisters, with whom he is staying. Overall feeling fine. SOB walking around [employer]. Denies  palpitations, abnormal bleeding, CP, dizziness, edema, or PND/Orthopnea. Appetite ok. No fever or chills. Weight at home 174 pounds. Taking all medications. No further ETOH. Asking for handicap placard.  Cardiac Studies - Ltd Echo 2/25: EF 60-65%, RV mildly reduced, severe LVH  - RHC 2/25: RA 6, PA 45/18 (29), PCWP 17, CO/CI (Fick) 6/3, PVR 2-2.1 WU. PAPi 4.5 WU  - TEE (02/11/23): EF 30-35%, mild MR, mild to moderate TR, moderately sized patent foramen ovale with R-->L shunting No thrombus  - Echo 02/09/23: 30-35%, RV moderately down, mild to moderate MR  - Echo 12/24: EF 55-60%, mild LVH  Past Medical History:  Diagnosis Date   Alcohol abuse    Rehab 2016   Anxiety     Atrial fibrillation (HCC)    Depression    GERD (gastroesophageal reflux disease)    Gout    Headache    Hypertension    Current Outpatient Medications  Medication Sig Dispense Refill   acetaminophen (TYLENOL) 500 MG tablet Take 500 mg by mouth every 6 (six) hours as needed for moderate pain (pain score 4-6).     allopurinol (ZYLOPRIM) 100 MG tablet Take 100 mg by mouth daily.     amiodarone (PACERONE) 200 MG tablet Take 1 tablet (200 mg total) by mouth 2 (two) times daily for 2 days, THEN 1 tablet (200 mg total) daily. 34 tablet 0   apixaban (ELIQUIS) 5 MG TABS tablet Take 1 tablet (5 mg total) by mouth 2 (two) times daily. 60 tablet 3   cyanocobalamin (VITAMIN B12) 500 MCG tablet Take 1 tablet (500 mcg total) by mouth daily.     dapagliflozin propanediol (FARXIGA) 10 MG TABS tablet Take 1 tablet (10 mg total) by mouth daily before breakfast. 30 tablet 3   folic acid (FOLVITE) 1 MG tablet Take 1 tablet (1 mg total) by mouth daily.     furosemide (LASIX) 40 MG tablet Take 40 mg by mouth daily.     pantoprazole (PROTONIX) 40 MG tablet Take 1 tablet (40 mg total) by mouth 2 (two) times daily.     potassium chloride SA (KLOR-CON M) 20 MEQ tablet Take 1 tablet (20 mEq total) by mouth daily.     ferrous sulfate 325 (65 FE) MG tablet Take  1 tablet (325 mg total) by mouth 2 (two) times daily with a meal. (Patient not taking: Reported on 03/15/2023) 60 tablet 0   No current facility-administered medications for this encounter.   Allergies  Allergen Reactions   Chlorhexidine Gluconate Rash   Social History   Socioeconomic History   Marital status: Single    Spouse name: Not on file   Number of children: 0   Years of education: Not on file   Highest education level: High school graduate  Occupational History   Occupation: retired  Tobacco Use   Smoking status: Former    Types: Cigars   Smokeless tobacco: Never   Tobacco comments:    quit x 5 months  Vaping Use   Vaping status:  Never Used  Substance and Sexual Activity   Alcohol use: Yes    Alcohol/week: 2.0 - 3.0 standard drinks of alcohol    Types: 2 - 3 Shots of liquor per week    Comment: 2-3 shots liquor each evening   Drug use: No   Sexual activity: Not Currently  Other Topics Concern   Not on file  Social History Narrative   Not on file   Social Drivers of Health   Financial Resource Strain: Low Risk  (02/19/2023)   Overall Financial Resource Strain (CARDIA)    Difficulty of Paying Living Expenses: Not very hard  Food Insecurity: No Food Insecurity (02/08/2023)   Hunger Vital Sign    Worried About Running Out of Food in the Last Year: Never true    Ran Out of Food in the Last Year: Never true  Transportation Needs: No Transportation Needs (02/19/2023)   PRAPARE - Administrator, Civil Service (Medical): No    Lack of Transportation (Non-Medical): No  Physical Activity: Not on file  Stress: Not on file  Social Connections: Socially Isolated (02/08/2023)   Social Connection and Isolation Panel [NHANES]    Frequency of Communication with Friends and Family: More than three times a week    Frequency of Social Gatherings with Friends and Family: Never    Attends Religious Services: Never    Database administrator or Organizations: No    Attends Banker Meetings: Never    Marital Status: Never married  Intimate Partner Violence: Not At Risk (02/08/2023)   Humiliation, Afraid, Rape, and Kick questionnaire    Fear of Current or Ex-Partner: No    Emotionally Abused: No    Physically Abused: No    Sexually Abused: No   Family History  Problem Relation Age of Onset   Colon cancer Maternal Uncle    Wt Readings from Last 3 Encounters:  03/29/23 82 kg (180 lb 12.8 oz)  03/15/23 78.9 kg (174 lb)  03/04/23 80.3 kg (177 lb)   BP 110/82 (BP Location: Left Arm, Patient Position: Sitting, Cuff Size: Normal)   Pulse 82   Ht 5\' 10"  (1.778 m)   Wt 82 kg (180 lb 12.8 oz)   SpO2 99%    BMI 25.94 kg/m   PHYSICAL EXAM: General:  NAD. No resp difficulty, walked into clinic HEENT: Normal Neck: Supple. No JVD. Cor: Regular rate & rhythm. No rubs, gallops or murmurs. Lungs: Clear Abdomen: Soft, nontender, nondistended.  Extremities: No cyanosis, clubbing, rash, edema Neuro: Alert & oriented x 3, moves all 4 extremities w/o difficulty. Affect pleasant.  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  - Ltd echo 02/21/23 (with maintenance of SR): EF 60-65%, RV mildly reduced - NYHA IIb-III, confounded by physical deconditioning. Volume OK today. - GDMT previously limited by hypotension, now off midodrine. - Start spiro 12.5 mg daily, stop KCL. - Continue Lasix 40 mg daily. - Continue Farxiga 10 mg daily. - Repeat echo in 3 months to ensure EF stable. - Labs today, repeat BMET in 1 week at [facility]  2. Paroxysmal atrial fibrillation - Diagnosed 12/24 - S/p DCCV to SR 2/25 - Regular on exam today. - Continue amiodarone 200 mg daily. Recent TSH/LFTs normal. - Continue Eliquis 5 mg bid. No bleeding issues.  3. ETOH abuse - No further use since discharge. - Cessation imperative.   4. Snoring - Arrange sleep study.  Referred to HFSW (PCP, Medications, Transportation, ETOH Abuse, Drug Abuse, Insurance, Financial ): No Refer to Pharmacy: No Refer to Home Health: No Refer to Advanced Heart Failure Clinic: Yes, assign to Dr. Elwyn Lade. Refer to General Cardiology: No, already has appointment  Refer to AHF clinic.  Follow up with AHF APP in 3-4 weeks and 3 months with Dr. Elwyn Lade + echo. If EF remains stable, consider graduation from HF clinic.  Prince Rome,  FNP-BC 03/29/23

## 2023-03-29 ENCOUNTER — Ambulatory Visit (HOSPITAL_COMMUNITY)
Admission: RE | Admit: 2023-03-29 | Discharge: 2023-03-29 | Disposition: A | Discharge status: Home / Self Care | Payer: Medicare Other | Source: Ambulatory Visit | Attending: Family Medicine | Admitting: Family Medicine

## 2023-03-29 ENCOUNTER — Encounter (HOSPITAL_COMMUNITY): Payer: Self-pay

## 2023-03-29 VITALS — BP 110/82 | HR 82 | Ht 70.0 in | Wt 180.8 lb

## 2023-03-29 DIAGNOSIS — F1010 Alcohol abuse, uncomplicated: Secondary | ICD-10-CM

## 2023-03-29 DIAGNOSIS — Q2112 Patent foramen ovale: Secondary | ICD-10-CM | POA: Insufficient documentation

## 2023-03-29 DIAGNOSIS — I5022 Chronic systolic (congestive) heart failure: Secondary | ICD-10-CM | POA: Insufficient documentation

## 2023-03-29 DIAGNOSIS — I081 Rheumatic disorders of both mitral and tricuspid valves: Secondary | ICD-10-CM | POA: Diagnosis not present

## 2023-03-29 DIAGNOSIS — I480 Paroxysmal atrial fibrillation: Secondary | ICD-10-CM | POA: Diagnosis not present

## 2023-03-29 DIAGNOSIS — Z87891 Personal history of nicotine dependence: Secondary | ICD-10-CM | POA: Insufficient documentation

## 2023-03-29 DIAGNOSIS — I5020 Unspecified systolic (congestive) heart failure: Secondary | ICD-10-CM | POA: Diagnosis present

## 2023-03-29 DIAGNOSIS — R0683 Snoring: Secondary | ICD-10-CM | POA: Diagnosis not present

## 2023-03-29 DIAGNOSIS — K219 Gastro-esophageal reflux disease without esophagitis: Secondary | ICD-10-CM | POA: Diagnosis not present

## 2023-03-29 DIAGNOSIS — Z7901 Long term (current) use of anticoagulants: Secondary | ICD-10-CM | POA: Diagnosis not present

## 2023-03-29 DIAGNOSIS — F1011 Alcohol abuse, in remission: Secondary | ICD-10-CM | POA: Diagnosis not present

## 2023-03-29 DIAGNOSIS — I110 Hypertensive heart disease with heart failure: Secondary | ICD-10-CM | POA: Diagnosis not present

## 2023-03-29 DIAGNOSIS — Z79899 Other long term (current) drug therapy: Secondary | ICD-10-CM | POA: Diagnosis not present

## 2023-03-29 LAB — BASIC METABOLIC PANEL
Anion gap: 7 (ref 5–15)
BUN: 20 mg/dL (ref 8–23)
CO2: 29 mmol/L (ref 22–32)
Calcium: 9.5 mg/dL (ref 8.9–10.3)
Chloride: 104 mmol/L (ref 98–111)
Creatinine, Ser: 1.3 mg/dL — ABNORMAL HIGH (ref 0.61–1.24)
GFR, Estimated: >60 mL/min (ref >60)
Glucose, Bld: 88 mg/dL (ref 70–99)
Potassium: 4.6 mmol/L (ref 3.5–5.1)
Sodium: 140 mmol/L (ref 135–145)

## 2023-03-29 LAB — BRAIN NATRIURETIC PEPTIDE: B Natriuretic Peptide: 291.2 pg/mL — ABNORMAL HIGH (ref 0.0–100.0)

## 2023-03-29 MED ORDER — SPIRONOLACTONE 25 MG PO TABS: 12.5000 mg | ORAL_TABLET | Freq: Every day | ORAL | 3 refills | Status: DC

## 2023-03-29 MED ORDER — DAPAGLIFLOZIN PROPANEDIOL 10 MG PO TABS: 10.0000 mg | ORAL_TABLET | Freq: Every day | ORAL | 11 refills | Status: AC

## 2023-03-29 MED ORDER — FUROSEMIDE 40 MG PO TABS: 40.0000 mg | ORAL_TABLET | Freq: Every day | ORAL | 3 refills | Status: DC

## 2023-03-29 MED ORDER — APIXABAN 5 MG PO TABS: 5.0000 mg | ORAL_TABLET | Freq: Two times a day (BID) | ORAL | 11 refills | Status: AC

## 2023-03-29 MED ORDER — AMIODARONE HCL 200 MG PO TABS
200.0000 mg | ORAL_TABLET | Freq: Every day | ORAL | 3 refills | Status: DC
Start: 2023-03-29 — End: 2023-10-15

## 2023-03-29 NOTE — Patient Instructions (Signed)
 START Spironolactone 12.5 mg ( 1/2 Tab) daily.  STOP Potassium.  Labs done today, your results will be available in MyChart, we will contact you for abnormal readings.  Repeat blood work in a week at [facility].  Your physician has requested that you have an echocardiogram. Echocardiography is a painless test that uses sound waves to create images of your heart. It provides your doctor with information about the size and shape of your heart and how well your heart's chambers and valves are working. This procedure takes approximately one hour. There are no restrictions for this procedure. Please do NOT wear cologne, perfume, aftershave, or lotions (deodorant is allowed). Please arrive 15 minutes prior to your appointment time.  Please note: We ask at that you not bring children with you during ultrasound (echo/ vascular) testing. Due to room size and safety concerns, children are not allowed in the ultrasound rooms during exams. Our front office staff cannot provide observation of children in our lobby area while testing is being conducted. An adult accompanying a patient to their appointment will only be allowed in the ultrasound room at the discretion of the ultrasound technician under special circumstances. We apologize for any inconvenience.  Your provider has ordered a Sleep study for you. You will be called to have this test arranged.  Your physician recommends that you schedule a follow-up appointment in: as scheduled.  If you have any questions or concerns before your next appointment please send [place] a message through [place] or call our office at [phone].    TO LEAVE A MESSAGE FOR THE NURSE SELECT OPTION 2, PLEASE LEAVE A MESSAGE INCLUDING: YOUR NAME DATE OF BIRTH CALL BACK NUMBER REASON FOR CALL**this is important as we prioritize the call backs  YOU WILL RECEIVE A CALL BACK THE SAME DAY AS LONG AS YOU CALL BEFORE 4:00 PM  At the [facility], you  and your health needs are our priority. As part of our continuing mission to provide you with exceptional heart care, we have created designated Provider Care Teams. These Care Teams include your primary Cardiologist (physician) and Advanced Practice Providers (APPs- Physician Assistants and Nurse Practitioners) who all work together to provide you with the care you need, when you need it.   You may see any of the following providers on your designated Care Team at your next follow up: Dr Arvilla Meres Dr Marca Ancona Dr. Dorthula Nettles Dr. Clearnce Hasten Amy Filbert Schilder, NP Robbie Lis, [place] [facility] [place], [place] Brynda Peon, NP [place] Lee, NP Clarisa Kindred, NP Karle Plumber, PharmD Enos Fling, PharmD   Please be sure to bring in all your medications bottles to every appointment.    Thank you for choosing [facility]

## 2023-03-31 ENCOUNTER — Telehealth: Payer: Self-pay | Admitting: Gastroenterology

## 2023-03-31 NOTE — Telephone Encounter (Signed)
 Patient left a message re: an appt

## 2023-04-07 ENCOUNTER — Encounter: Payer: Self-pay | Admitting: Gastroenterology

## 2023-04-07 ENCOUNTER — Encounter (INDEPENDENT_AMBULATORY_CARE_PROVIDER_SITE_OTHER): Payer: Self-pay | Admitting: Gastroenterology

## 2023-04-07 ENCOUNTER — Ambulatory Visit (INDEPENDENT_AMBULATORY_CARE_PROVIDER_SITE_OTHER): Admitting: Gastroenterology

## 2023-04-07 ENCOUNTER — Telehealth (INDEPENDENT_AMBULATORY_CARE_PROVIDER_SITE_OTHER): Payer: Self-pay | Admitting: Gastroenterology

## 2023-04-07 VITALS — BP 105/74 | HR 80 | Temp 98.0°F | Ht 70.0 in | Wt 181.9 lb

## 2023-04-07 DIAGNOSIS — D518 Other vitamin B12 deficiency anemias: Secondary | ICD-10-CM

## 2023-04-07 DIAGNOSIS — D509 Iron deficiency anemia, unspecified: Secondary | ICD-10-CM | POA: Diagnosis not present

## 2023-04-07 DIAGNOSIS — E538 Deficiency of other specified B group vitamins: Secondary | ICD-10-CM | POA: Insufficient documentation

## 2023-04-07 DIAGNOSIS — K219 Gastro-esophageal reflux disease without esophagitis: Secondary | ICD-10-CM

## 2023-04-07 DIAGNOSIS — F1010 Alcohol abuse, uncomplicated: Secondary | ICD-10-CM | POA: Diagnosis not present

## 2023-04-07 DIAGNOSIS — D649 Anemia, unspecified: Secondary | ICD-10-CM | POA: Insufficient documentation

## 2023-04-07 DIAGNOSIS — D508 Other iron deficiency anemias: Secondary | ICD-10-CM

## 2023-04-07 NOTE — Telephone Encounter (Signed)
 Patient with diagnosis of A Fib on Eliquis for anticoagulation.    Procedure: colonoscopy Date of procedure: 05/03/23   CHA2DS2-VASc Score = 2  This indicates a 2.2% annual risk of stroke. The patient's score is based upon: CHF History: 1 HTN History: 1 Diabetes History: 0 Stroke History: 0 Vascular Disease History: 0 Age Score: 0 Gender Score: 0   CrCl 66 ml/min Platelet count 512K  Per office protocol, patient can hold Eliquis for 2 days prior to procedure.     **This guidance is not considered finalized until pre-operative APP has relayed final recommendations.**

## 2023-04-07 NOTE — Patient Instructions (Addendum)
 It was very nice to meet you today, as dicussed with will plan for the following :  1) Colonoscopy  2) Hematology referral ( blood doctors)
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
## 2023-04-07 NOTE — Telephone Encounter (Signed)
    04/07/23  Justin Fleming 06-09-1960  What type of surgery is being performed? Colonoscopy  When is surgery scheduled? 05/03/23  What type of clearance is required (medical or pharmacy to hold medication or both? Pharmacy to hold med  Are there any medications that need to be held prior to surgery and how long? Clearance to hold Eliquis for 2 days prior  Name of physician performing surgery?  Dr. Starleen Arms Gastroenterology at [facility] Phone: [phone] Fax: [phone]  Anethesia type (none, local, MAC, general)? Choice

## 2023-04-07 NOTE — Telephone Encounter (Signed)
   Patient Name: Justin Fleming  DOB: September 17, 1960 MRN: [id number]  Primary Cardiologist: Nona Dell, MD  Clinical pharmacists have reviewed the patient's past medical history, labs, and current medications as part of preoperative protocol coverage. The following recommendations have been made:   Per office protocol, patient can hold Eliquis for 2 days prior to procedure.    I will route this recommendation to the requesting party via Epic fax function and remove from pre-op pool.  Please call with questions.  Denyce Robert, NP 04/07/2023, 2:51 PM

## 2023-04-08 ENCOUNTER — Encounter: Payer: Self-pay | Admitting: Family Medicine

## 2023-04-08 ENCOUNTER — Ambulatory Visit: Payer: Medicare Other | Admitting: Family Medicine

## 2023-04-08 VITALS — BP 112/74 | HR 76 | Temp 98.2°F | Ht 70.0 in | Wt 185.2 lb

## 2023-04-08 DIAGNOSIS — K219 Gastro-esophageal reflux disease without esophagitis: Secondary | ICD-10-CM

## 2023-04-08 DIAGNOSIS — I5020 Unspecified systolic (congestive) heart failure: Secondary | ICD-10-CM

## 2023-04-08 DIAGNOSIS — D649 Anemia, unspecified: Secondary | ICD-10-CM | POA: Diagnosis not present

## 2023-04-08 DIAGNOSIS — Z125 Encounter for screening for malignant neoplasm of prostate: Secondary | ICD-10-CM

## 2023-04-08 DIAGNOSIS — Z0001 Encounter for general adult medical examination with abnormal findings: Secondary | ICD-10-CM | POA: Diagnosis not present

## 2023-04-08 DIAGNOSIS — I4819 Other persistent atrial fibrillation: Secondary | ICD-10-CM | POA: Diagnosis not present

## 2023-04-08 DIAGNOSIS — Z1159 Encounter for screening for other viral diseases: Secondary | ICD-10-CM

## 2023-04-08 DIAGNOSIS — Z0000 Encounter for general adult medical examination without abnormal findings: Secondary | ICD-10-CM | POA: Insufficient documentation

## 2023-04-08 MED ORDER — FERROUS SULFATE 325 (65 FE) MG PO TABS: 325.0000 mg | ORAL_TABLET | Freq: Two times a day (BID) | ORAL | 0 refills | Status: AC

## 2023-04-08 NOTE — Assessment & Plan Note (Signed)
 Followed by cardiology. Normal RHC, previously hypotensive, milrinone DCd. Needs ischemic workup MRI per cardiology, considering in 3 months due to recent cardioversion and on eliquis. ECHO in 3 months. Euvolemic today.
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
## 2023-04-08 NOTE — Assessment & Plan Note (Signed)
 Not taking Iron, CBC  today. Has been referred to heme/onc for iron infusions. Colonscopy upcoming.

## 2023-04-08 NOTE — Assessment & Plan Note (Signed)
 Regular rate and rhythm today. Continue Amiodarone 200mg  daily and Eliquis 5mg  BID.

## 2023-04-08 NOTE — Assessment & Plan Note (Signed)
 Well controlled on Protonix 40mg  BID. Seeing GI. Upcoming Colonoscopy

## 2023-04-08 NOTE — Assessment & Plan Note (Signed)
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
## 2023-04-08 NOTE — Patient Instructions (Signed)
  Justin Fleming , Thank you for taking time to come for your Medicare Wellness Visit. I appreciate your ongoing commitment to your health goals. Please review the following plan we discussed and let me know if I can assist you in the future.   These are the goals we discussed:  Goals   None     This is a list of the screening recommended for you and due dates:  Health Maintenance  Topic Date Due   Hepatitis C Screening  Never done   COVID-19 Vaccine (3 - 2024-25 season) 09/06/2022   Zoster (Shingles) Vaccine (1 of 2) 05/08/2023*   Pneumonia Vaccine (1 of 2 - PCV) 02/08/2024*   Flu Shot  08/06/2023   DTaP/Tdap/Td vaccine (2 - Td or Tdap) 11/25/2023   Medicare Annual Wellness Visit  04/07/2024   Colon Cancer Screening  06/14/2024   HIV Screening  Completed   HPV Vaccine  Aged Out  *Topic was postponed. The date shown is not the original due date.

## 2023-04-09 LAB — CBC WITH DIFFERENTIAL/PLATELET
Absolute Lymphocytes: 1623 {cells}/uL (ref 850–3900)
Absolute Monocytes: 750 {cells}/uL (ref 200–950)
Basophils Absolute: 49 {cells}/uL (ref 0–200)
Basophils Relative: 0.8 %
Eosinophils Absolute: 171 {cells}/uL (ref 15–500)
Eosinophils Relative: 2.8 %
HCT: 36.8 % — ABNORMAL LOW (ref 38.5–50.0)
Hemoglobin: 11.7 g/dL — ABNORMAL LOW (ref 13.2–17.1)
MCH: 30.5 pg (ref 27.0–33.0)
MCHC: 31.8 g/dL — ABNORMAL LOW (ref 32.0–36.0)
MCV: 96.1 fL (ref 80.0–100.0)
MPV: 10.4 fL (ref 7.5–12.5)
Monocytes Relative: 12.3 %
Neutro Abs: 3508 {cells}/uL (ref 1500–7800)
Neutrophils Relative %: 57.5 %
Platelets: 260 10*3/uL (ref 140–400)
RBC: 3.83 10*6/uL — ABNORMAL LOW (ref 4.20–5.80)
RDW: 15.1 % — ABNORMAL HIGH (ref 11.0–15.0)
Total Lymphocyte: 26.6 %
WBC: 6.1 10*3/uL (ref 3.8–10.8)

## 2023-04-09 LAB — PSA: PSA: 0.48 ng/mL (ref <=4.00)

## 2023-04-09 LAB — HEPATITIS C ANTIBODY: Hepatitis C Ab: NONREACTIVE

## 2023-04-27 ENCOUNTER — Telehealth (HOSPITAL_COMMUNITY): Payer: Self-pay | Admitting: *Deleted

## 2023-04-27 ENCOUNTER — Encounter (HOSPITAL_COMMUNITY): Payer: Self-pay

## 2023-04-27 ENCOUNTER — Telehealth (INDEPENDENT_AMBULATORY_CARE_PROVIDER_SITE_OTHER): Payer: Self-pay | Admitting: Gastroenterology

## 2023-04-27 ENCOUNTER — Ambulatory Visit (HOSPITAL_COMMUNITY)
Admission: RE | Admit: 2023-04-27 | Discharge: 2023-04-27 | Disposition: A | Discharge status: Home / Self Care | Source: Ambulatory Visit | Attending: Family Medicine | Admitting: Family Medicine

## 2023-04-27 ENCOUNTER — Encounter (HOSPITAL_COMMUNITY): Payer: Self-pay | Admitting: *Deleted

## 2023-04-27 VITALS — BP 126/90 | HR 91 | Wt 187.0 lb

## 2023-04-27 DIAGNOSIS — I480 Paroxysmal atrial fibrillation: Secondary | ICD-10-CM | POA: Insufficient documentation

## 2023-04-27 DIAGNOSIS — F1010 Alcohol abuse, uncomplicated: Secondary | ICD-10-CM | POA: Diagnosis not present

## 2023-04-27 DIAGNOSIS — R0602 Shortness of breath: Secondary | ICD-10-CM | POA: Diagnosis present

## 2023-04-27 DIAGNOSIS — Z79899 Other long term (current) drug therapy: Secondary | ICD-10-CM | POA: Insufficient documentation

## 2023-04-27 DIAGNOSIS — I5022 Chronic systolic (congestive) heart failure: Secondary | ICD-10-CM | POA: Insufficient documentation

## 2023-04-27 DIAGNOSIS — F17290 Nicotine dependence, other tobacco product, uncomplicated: Secondary | ICD-10-CM | POA: Insufficient documentation

## 2023-04-27 DIAGNOSIS — Z91148 Patient's other noncompliance with medication regimen for other reason: Secondary | ICD-10-CM | POA: Diagnosis not present

## 2023-04-27 DIAGNOSIS — R0683 Snoring: Secondary | ICD-10-CM | POA: Diagnosis not present

## 2023-04-27 DIAGNOSIS — I110 Hypertensive heart disease with heart failure: Secondary | ICD-10-CM | POA: Insufficient documentation

## 2023-04-27 DIAGNOSIS — Z7984 Long term (current) use of oral hypoglycemic drugs: Secondary | ICD-10-CM | POA: Insufficient documentation

## 2023-04-27 DIAGNOSIS — Z7901 Long term (current) use of anticoagulants: Secondary | ICD-10-CM | POA: Diagnosis not present

## 2023-04-27 LAB — BASIC METABOLIC PANEL WITH GFR
Anion gap: 11 (ref 5–15)
BUN: 15 mg/dL (ref 8–23)
CO2: 26 mmol/L (ref 22–32)
Calcium: 9.5 mg/dL (ref 8.9–10.3)
Chloride: 101 mmol/L (ref 98–111)
Creatinine, Ser: 1.17 mg/dL (ref 0.61–1.24)
GFR, Estimated: >60 mL/min (ref >60)
Glucose, Bld: 91 mg/dL (ref 70–99)
Potassium: 4.5 mmol/L (ref 3.5–5.1)
Sodium: 138 mmol/L (ref 135–145)

## 2023-04-27 LAB — BRAIN NATRIURETIC PEPTIDE: B Natriuretic Peptide: 489.5 pg/mL — ABNORMAL HIGH (ref 0.0–100.0)

## 2023-04-27 MED ORDER — LOSARTAN POTASSIUM 25 MG PO TABS: 12.5000 mg | ORAL_TABLET | Freq: Every day | ORAL | 3 refills | Status: DC

## 2023-04-27 MED ORDER — METOPROLOL SUCCINATE ER 25 MG PO TB24: 12.5000 mg | ORAL_TABLET | Freq: Every day | ORAL | 3 refills | Status: DC

## 2023-04-27 NOTE — Telephone Encounter (Signed)
 Patient left message to cancel his procedure with Dr Alita Irwin on 05/03/2023. [phone]

## 2023-04-27 NOTE — Progress Notes (Signed)
 ADVANCED HF CLINIC CONSULT NOTE  Primary Care: Jenelle Mis, FNP Primary Cardiologist: Teddie Favre, MD HF Cardiologist: Dr. Alease Amend  HPI: 63 y.o. male with history of alcohol abuse, tobacco use, atrial fibrillation, anxiety/depression, HTN, GERD.    Admitted in 12/24 with alcohol intoxication and Afib with RVR. Echo at that time with EF 55-60%. He was started on amiodarone  and digoxin . Underwent workup for anemia, and found to have gastritis on EGD. Refused colonoscopy.   He was readmitted 2/25 with Afib with RVR, a/c HF and fever. Had not been compliant with home medications. He was positive for coronaviurs OC43 and also treated for CAP. Echo with EF 30-35%, moderately reduced RV, moderate BAE. Loaded with IV amiodarone  and underwent TEE/DCCV to SR. Concern for low-output HF, PICC line placed, started on milrinone  with initial CO-OX of 54%. Midodrine  added to support blood pressure. Once volume optimized milrinone  weaned off. RHC showed normal to mildly elevated filling pressures and preserved Fick and TD CI. Ltd echo with maintenance of SR showed EF improved to 60-65%. GDMT remained limited by BP. Discharged to SNF. Referred to TOC.  Today he presents to AHF to establish care, referred from [facility]. Overall feeling fine. He has SOB further distances on flat ground. Denies palpitations, abnormal bleeding, CP, dizziness, edema, or PND/Orthopnea. Appetite ok. No fever or chills. Weight at home 177 pounds. Taking all medications. Drank a 1 pint liquor last week. Lives with sister.  Cardiac Studies - Ltd Echo 2/25: EF 60-65%, RV mildly reduced, severe LVH   - RHC 2/25: RA 6, PA 45/18 (29), PCWP 17, CO/CI (Fick) 6/3, PVR 2-2.1 WU. PAPi 4.5 WU   - TEE (02/11/23): EF 30-35%, mild MR, mild to moderate TR, moderately sized patent foramen ovale with R-->L shunting No thrombus   - Echo 02/09/23: 30-35%, RV moderately down, mild to moderate MR   - Echo 12/24: EF 55-60%, mild LVH  Past Medical  History:  Diagnosis Date   Alcohol abuse    Rehab 2016   Anxiety    Atrial fibrillation (HCC)    Depression    GERD (gastroesophageal reflux disease)    Gout    Headache    Hypertension    Current Outpatient Medications  Medication Sig Dispense Refill   acetaminophen  (TYLENOL ) 500 MG tablet Take 500 mg by mouth every 6 (six) hours as needed for moderate pain (pain score 4-6).     allopurinol (ZYLOPRIM) 100 MG tablet Take 100 mg by mouth daily.     amiodarone  (PACERONE ) 200 MG tablet Take 1 tablet (200 mg total) by mouth daily. 90 tablet 3   apixaban  (ELIQUIS ) 5 MG TABS tablet Take 1 tablet (5 mg total) by mouth 2 (two) times daily. 60 tablet 11   cyanocobalamin  (VITAMIN B12) 500 MCG tablet Take 1 tablet (500 mcg total) by mouth daily.     dapagliflozin  propanediol (FARXIGA ) 10 MG TABS tablet Take 1 tablet (10 mg total) by mouth daily before breakfast. 30 tablet 11   ferrous sulfate  325 (65 FE) MG tablet Take 1 tablet (325 mg total) by mouth 2 (two) times daily with a meal. 60 tablet 0   folic acid  (FOLVITE ) 1 MG tablet Take 1 tablet (1 mg total) by mouth daily.     furosemide  (LASIX ) 40 MG tablet Take 1 tablet (40 mg total) by mouth daily. 90 tablet 3   pantoprazole  (PROTONIX ) 40 MG tablet Take 1 tablet (40 mg total) by mouth 2 (two) times daily.  spironolactone  (ALDACTONE ) 25 MG tablet Take 0.5 tablets (12.5 mg total) by mouth daily. 45 tablet 3   No current facility-administered medications for this encounter.   Allergies  Allergen Reactions   Chlorhexidine  Gluconate Rash   Social History   Socioeconomic History   Marital status: Single    Spouse name: Not on file   Number of children: 0   Years of education: Not on file   Highest education level: High school graduate  Occupational History   Occupation: retired  Tobacco Use   Smoking status: Some Days    Types: Cigars, Cigarettes   Smokeless tobacco: Never   Tobacco comments:    quit x 5 months  Vaping Use    Vaping status: Never Used  Substance and Sexual Activity   Alcohol use: Not Currently    Comment: 2-3 shots liquor each evening   Drug use: No   Sexual activity: Not Currently  Other Topics Concern   Not on file  Social History Narrative   Not on file   Social Drivers of Health   Financial Resource Strain: Low Risk  (04/08/2023)   Overall Financial Resource Strain (CARDIA)    Difficulty of Paying Living Expenses: Not hard at all  Food Insecurity: No Food Insecurity (04/08/2023)   Hunger Vital Sign    Worried About Running Out of Food in the Last Year: Never true    Ran Out of Food in the Last Year: Never true  Transportation Needs: No Transportation Needs (04/08/2023)   PRAPARE - Administrator, Civil Service (Medical): No    Lack of Transportation (Non-Medical): No  Physical Activity: Insufficiently Active (04/08/2023)   Exercise Vital Sign    Days of Exercise per Week: 1 day    Minutes of Exercise per Session: 10 min  Stress: Not on file  Social Connections: Moderately Isolated (04/08/2023)   Social Connection and Isolation Panel [NHANES]    Frequency of Communication with Friends and Family: More than three times a week    Frequency of Social Gatherings with Friends and Family: More than three times a week    Attends Religious Services: 1 to 4 times per year    Active Member of [employer] or Organizations: No    Attends Banker Meetings: Never    Marital Status: Never married  Intimate Partner Violence: Not At Risk (04/08/2023)   Humiliation, Afraid, Rape, and Kick questionnaire    Fear of Current or Ex-Partner: No    Emotionally Abused: No    Physically Abused: No    Sexually Abused: No   Family History  Problem Relation Age of Onset   Colon cancer Maternal Uncle    Wt Readings from Last 3 Encounters:  04/27/23 84.8 kg (187 lb)  04/08/23 84 kg (185 lb 3.2 oz)  04/07/23 82.5 kg (181 lb 14.4 oz)   BP (!) 126/90   Pulse 91   Wt 84.8 kg (187 lb)   SpO2  97%   BMI 26.83 kg/m   PHYSICAL EXAM: General:  NAD. No resp difficulty, walked into clinic HEENT: Normal Neck: Supple. No JVD. Cor: Regular rate & rhythm. No rubs, gallops or murmurs. Lungs: Clear Abdomen: Soft, nontender, nondistended.  Extremities: No cyanosis, clubbing, rash, edema Neuro: Alert & oriented x 3, moves all 4 extremities w/o difficulty. Affect pleasant.  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  - Ltd echo 02/21/23 (with maintenance of SR): EF 60-65%, RV mildly reduced - NYHA II, confounded by physical deconditioning. Volume OK today. - Start losartan  12.5 mg daily. - Start Toprol  XL 12.5 mg at bedtime. - Continue spiro 12.5 mg daily  - Continue Lasix  40 mg daily. - Continue Farxiga  10 mg daily. No GU symptoms. - Repeat echo in 2-3 months to ensure EF stable. - Labs today, repeat BMET in 10-14 days at [facility].   2. Paroxysmal atrial fibrillation - Diagnosed 12/24 - S/p DCCV to SR 2/25 - Regular on exam today. - Continue amiodarone  200 mg daily. Recent TSH/LFTs normal. Consider reducing dose/stopping soon. - Continue Eliquis  5 mg bid. No bleeding issues.   3. ETOH abuse - Back to drinking, discussed cessation - Cessation imperative.    4. Snoring - Arrange sleep study.  Follow up 2 months with Dr. Alease Amend + echo. If EF remains stable, consider graduation from HF clinic.  Vernia Good, FNP-BC 04/27/23

## 2023-04-27 NOTE — Telephone Encounter (Signed)
 Unable to reach patient at listed phone numbers. Letter sent as below:  Per Vernia Good, NP please see lab results and instructions below:  "BNP elevated Increase Lasix  to 60 mg daily, add 10 KCL daily, he has repeat labs arranged at [facility]"  Rx for increased Lasix  and potassium (KCL) sent to your pharmacy.   I tried calling your phone numbers we have listed in our system, all with message that these numbers are no longer in service.   Please call us  at [phone] option 2 for any questions or concerns. Also, can you call us  with your current telephone number?

## 2023-04-27 NOTE — Telephone Encounter (Signed)
 Left message to return call

## 2023-04-27 NOTE — Patient Instructions (Addendum)
 Start Losartan  12.5 mg daily - Rx sent. Start Toprol  XL 12.5 mg daily at bedtime - Rx sent. Labs today - will call you if abnormal. REPEAT LABS AT [facility] IN TWO WEEKS - ORDER HAS BEEN SENT.   Call us  at [phone] option 2 if you have any dizziness or lightheadedness. Keep your scheduled follow up to see Dr. Alease Amend in our Heart Failure Clinic - see below. Please call us  at [phone] if any questions or concerns prior to your next visit.    Justin Fleming does not do sleep studies. We will schedule this at [facility] and call you**

## 2023-04-30 NOTE — Telephone Encounter (Signed)
 Pt contacted. Pt states he would like to think about rescheduling colonoscopy and he will call back

## 2023-05-03 ENCOUNTER — Encounter (HOSPITAL_COMMUNITY): Admission: RE | Payer: Self-pay | Source: Home / Self Care

## 2023-05-03 ENCOUNTER — Ambulatory Visit (HOSPITAL_COMMUNITY): Admission: RE | Admit: 2023-05-03 | Source: Home / Self Care | Admitting: Gastroenterology

## 2023-05-03 SURGERY — COLONOSCOPY
Anesthesia: Choice

## 2023-05-07 ENCOUNTER — Telehealth: Payer: Self-pay

## 2023-05-07 NOTE — Telephone Encounter (Signed)
 Copied from CRM [id number]. Topic: General - Other >> May 07, 2023 12:05 PM Justin Fleming wrote: Reason for CRM: Pt's sister Rosalyn Combes ph: [phone] regarding his alcoholism.

## 2023-05-28 ENCOUNTER — Other Ambulatory Visit: Payer: Medicare Other

## 2023-06-01 ENCOUNTER — Encounter: Payer: Self-pay | Admitting: Family Medicine

## 2023-06-01 ENCOUNTER — Ambulatory Visit (INDEPENDENT_AMBULATORY_CARE_PROVIDER_SITE_OTHER): Payer: Medicare Other | Admitting: Family Medicine

## 2023-06-01 VITALS — BP 120/82 | HR 85 | Ht 70.0 in | Wt 190.8 lb

## 2023-06-01 DIAGNOSIS — F1020 Alcohol dependence, uncomplicated: Secondary | ICD-10-CM | POA: Diagnosis not present
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
## 2023-06-01 NOTE — Assessment & Plan Note (Signed)
 Encouraged abstaining from alcohol and discussed risks of ongoing use. I encouraged him to seek community support if needed from programs such as AA. Other options include [facility] and [facility].

## 2023-06-10 ENCOUNTER — Ambulatory Visit (HOSPITAL_COMMUNITY): Admission: RE | Admit: 2023-06-10 | Source: Ambulatory Visit

## 2023-06-10 ENCOUNTER — Encounter (HOSPITAL_COMMUNITY): Admitting: Cardiology

## 2023-06-10 NOTE — Progress Notes (Incomplete)
   ADVANCED HEART FAILURE FOLLOW UP CLINIC NOTE  Referring Physician: Jenelle Mis, FNP  Primary Care: Jenelle Mis, FNP Primary Cardiologist:  HPI: Justin Fleming is a 63 y.o. male who presents for follow up of ***.      Admitted in 12/24 with alcohol intoxication and Afib with RVR. Echo at that time with EF 55-60%. He was started on amiodarone  and digoxin . Underwent workup for anemia, and found to have gastritis on EGD. Refused colonoscopy.   He was readmitted 2/25 with Afib with RVR, a/c HF and fever. Had not been compliant with home medications. He was positive for coronaviurs OC43 and also treated for CAP. Echo with EF 30-35%, moderately reduced RV, moderate BAE. Loaded with IV amiodarone  and underwent TEE/DCCV to SR. Concern for low-output HF, PICC line placed, started on milrinone  with initial CO-OX of 54%. Midodrine  added to support blood pressure. Once volume optimized milrinone  weaned off. RHC showed normal to mildly elevated filling pressures and preserved Fick and TD CI. Ltd echo with maintenance of SR showed EF improved to 60-65%. GDMT remained limited by BP. Discharged to SNF. Referred to TOC.     SUBJECTIVE:  PMH, current medications, allergies, social history, and family history reviewed in epic.  PHYSICAL EXAM: There were no vitals filed for this visit. GENERAL: Well nourished and in no apparent distress at rest.  PULM:  Normal work of breathing, clear to auscultation bilaterally. Respirations are unlabored.  CARDIAC:  JVP: ***         Normal rate with regular rhythm. No murmurs, rubs or gallops.  *** edema. Warm and well perfused extremities. ABDOMEN: Soft, non-tender, non-distended. NEUROLOGIC: Patient is oriented x3 with no focal or lateralizing neurologic deficits.    DATA REVIEW  ECG: ***    ECHO: ***   CATH: ***   Heart failure review: - Classification: {HFCLASS:30917} - Etiology: {Cardiomyopathy:30918} - NYHA Class:  - Volume status:  {volumechf:30919} - ACEi/ARB/ARNI: {HF:30752} - Aldosterone antagonist: {HF:30752} - Beta-blocker: {HF:30752} - Digoxin : {HF:30752} - Hydralazine/Nitrates: {HF:30752} - SGLT2i: {HF:30752} - GLP-1: {GLP:30906} - Advanced therapies: {Advancedtherapies:30916} - ICD: {ICD:30901}  ASSESSMENT & PLAN:  ***  Follow up in ***  Arta Lark, MD Advanced Heart Failure Mechanical Circulatory Support 06/10/23

## 2023-07-07 ENCOUNTER — Ambulatory Visit (HOSPITAL_BASED_OUTPATIENT_CLINIC_OR_DEPARTMENT_OTHER): Attending: Family Medicine | Admitting: Cardiology

## 2023-07-19 NOTE — Progress Notes (Deleted)
 Cardiology Office Note:  .   Date:  07/19/2023  ID:  [name], DOB [birth year], MRN [id number] PCP: Kayla Jeoffrey RAMAN, FNP  [facility] Providers Cardiologist:  Jayson Sierras, MD {  History of Present Illness: .   Justin Fleming is a 63 y.o. male  with PMHx of chronic HFimpEF (55-60% in 12/2022, 30-35% in 02/09/2023 in setting of afib/Etoh abuse/viral illness, 60-65% in 02/21/2023), persistent afib (Diagnosed 12/2022 s/p DCCV to SR 02/2023, on Amio and Eliquis ), resolved hypotension d/c  midodrine , CKD S3,  ETOH abuse, GERD, anxiety/depression, chronic anemia, known gastritis with GI bleed in 01/2023 who reports to [facility] office for 4 month follow up.   Last seen in AHF clinic 04/27/2023 with [facility] for follow up. Reported SOB with further distances on flat ground. Reported home weight of 177 lbs with OV weight of 187 lbs. Started on Losartan  12.5 mg and Toprol  XL 12.5 mg. Continued on current cardiac regimen. Follow up BNP 04/27/2023 was 489. Follow up BMP 04/27/2023 WNL with plans for repeat in 2 weeks that was not completed.  Follow up ECHO was scheduled for 06/10/2023 but no show.    Today, reports ### and denies ###.  Reports compliance with medications.  Dietary habitats:  Activity level:  Social: Denies tobacco use/Binging ETOH/drug use  Denies any hospitalizations or visits to the emergency department.   Lives with sister???  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  RHC 2/25 off milrinone : normal to mildly elevated filling pressures and preserved Fick and TD CI Limited echo 02/21/23 (with maintenance of SR): EF 60-65%, RV mildly reduced Follow up ECHO was scheduled for 06/10/2023 but no show.  Discussed the  importance of repeat ECHO to evaluate EF. Patient agrees. Order ECHO.  Appears euvolemic on exam.  04/2023: Cr 1.17, K 4.5. Discussed completing pending BMP order. Patient agrees.  Continue on Spiro 12.5 mg daily, Lasix  40 mg daily, Farxiga  10 mg, Losartan  12.5 mg daily, and Toprol  XL 12.5 mg daily.    Persistent atrial fibrillation (HCC) Diagnosed 12/2022 s/p DCCV to SR 02/2023 Regular, rate and rhythm on exam 02/2023: LFT/TSH WNL Continue on Eliquis  5 mg BID and Amio 200 mg daily.  Discuss d/c Amio after 6 month, which would be 08/2023. Will contact Dr. Sierras to ensure he agrees.   ETOH abuse Reports   Snoring Sleep study ordered in 02/2023 but has not been completed yet.  Discuss the connection between untreated sleep apnea and recurrent afib. Encourage to complete sleep study.    ROS: 10 point review of system has been reviewed and considered negative except ones been listed in the HPI.   Studies Reviewed: .   Limited ECHO 02/21/2023 IMPRESSIONS   1. Left ventricular ejection fraction, by estimation, is 60 to 65%. The  left ventricle has normal function. There is severe left ventricular  hypertrophy.   2. Right ventricular systolic function is mildly reduced. The right  ventricular size is normal.   3. The inferior vena cava is normal in size with <50% respiratory  variability, suggesting right atrial pressure of 8 mmHg.   Comparison(s): Prior images reviewed side by side. Changes from prior  study are noted. LVEF has improved. RV function may have improved by one  grade. RA pressure estimate matches clinically  obtained CVP today.   RHC 02/18/2023 HEMODYNAMICS: RA:                  6 mmHg (mean) RV:                  45/6-10 mmHg PA:                  45/18 mmHg (29 mean) PCWP:            17 mmHg (mean)                                      Estimated Fick CO/CI   6 L/min, 3 L/min/m2 Thermodilution CO/CI   5.6 L/min, 2.8 L/min/m2                                               TPG                 12  mmHg                                            PVR                 2-2.1 Wood Units  PAPi                4.5       IMPRESSION: Normal to mildly elevated pre and post capillary filling pressures Normal cardiac output & index  Risk Assessment/Calculations:   {Does this patient have ATRIAL FIBRILLATION?[phone]} No BP recorded.  {Refresh Note OR Click here to enter BP  :1}***       Physical Exam:   VS:  There were no vitals taken for this visit.   Wt Readings from Last 3 Encounters:  06/01/23 190 lb 12.8 oz (86.5 kg)  04/27/23 187 lb (84.8 kg)  04/08/23 185 lb 3.2 oz (84 kg)    GEN: Well nourished, well developed in no acute distress while sitting in chair.  NECK: No JVD; No carotid bruits CARDIAC: ***RRR, no murmurs, rubs, gallops RESPIRATORY:  Clear to auscultation without rales, wheezing or rhonchi  ABDOMEN: Soft, non-tender, non-distended EXTREMITIES:  No edema; No deformity   ASSESSMENT AND PLAN: .   ***    {Are you ordering a CV Procedure (e.g. stress test, cath, DCCV, TEE, etc)?   Press F2        :[id number]}  Dispo: ***  Signed, Lorette CINDERELLA Kapur, PA-C

## 2023-07-20 ENCOUNTER — Ambulatory Visit: Attending: Student | Admitting: Physician Assistant

## 2023-07-20 DIAGNOSIS — F1010 Alcohol abuse, uncomplicated: Secondary | ICD-10-CM

## 2023-07-20 DIAGNOSIS — I5032 Chronic diastolic (congestive) heart failure: Secondary | ICD-10-CM

## 2023-07-20 DIAGNOSIS — I4819 Other persistent atrial fibrillation: Secondary | ICD-10-CM

## 2023-08-05 ENCOUNTER — Other Ambulatory Visit

## 2023-08-09 ENCOUNTER — Ambulatory Visit: Admitting: Family Medicine

## 2023-10-08 ENCOUNTER — Inpatient Hospital Stay (HOSPITAL_COMMUNITY)
Admission: EM | Admit: 2023-10-08 | Discharge: 2023-10-15 | DRG: 291 | Disposition: A | Discharge status: Home / Self Care | Attending: Internal Medicine | Admitting: Internal Medicine

## 2023-10-08 ENCOUNTER — Emergency Department (HOSPITAL_COMMUNITY)

## 2023-10-08 ENCOUNTER — Other Ambulatory Visit: Payer: Self-pay

## 2023-10-08 DIAGNOSIS — I2489 Other forms of acute ischemic heart disease: Secondary | ICD-10-CM | POA: Diagnosis present

## 2023-10-08 DIAGNOSIS — Z604 Social exclusion and rejection: Secondary | ICD-10-CM | POA: Diagnosis not present

## 2023-10-08 DIAGNOSIS — I130 Hypertensive heart and chronic kidney disease with heart failure and stage 1 through stage 4 chronic kidney disease, or unspecified chronic kidney disease: Secondary | ICD-10-CM | POA: Diagnosis not present

## 2023-10-08 DIAGNOSIS — T50916A Underdosing of multiple unspecified drugs, medicaments and biological substances, initial encounter: Secondary | ICD-10-CM | POA: Diagnosis present

## 2023-10-08 DIAGNOSIS — R0683 Snoring: Secondary | ICD-10-CM | POA: Diagnosis present

## 2023-10-08 DIAGNOSIS — I5033 Acute on chronic diastolic (congestive) heart failure: Secondary | ICD-10-CM | POA: Diagnosis not present

## 2023-10-08 DIAGNOSIS — I517 Cardiomegaly: Secondary | ICD-10-CM | POA: Diagnosis not present

## 2023-10-08 DIAGNOSIS — I5023 Acute on chronic systolic (congestive) heart failure: Secondary | ICD-10-CM | POA: Diagnosis not present

## 2023-10-08 DIAGNOSIS — N5089 Other specified disorders of the male genital organs: Secondary | ICD-10-CM | POA: Diagnosis present

## 2023-10-08 DIAGNOSIS — Z452 Encounter for adjustment and management of vascular access device: Secondary | ICD-10-CM | POA: Diagnosis not present

## 2023-10-08 DIAGNOSIS — I5A Non-ischemic myocardial injury (non-traumatic): Secondary | ICD-10-CM | POA: Diagnosis not present

## 2023-10-08 DIAGNOSIS — I5043 Acute on chronic combined systolic (congestive) and diastolic (congestive) heart failure: Secondary | ICD-10-CM | POA: Diagnosis present

## 2023-10-08 DIAGNOSIS — D509 Iron deficiency anemia, unspecified: Secondary | ICD-10-CM | POA: Diagnosis not present

## 2023-10-08 DIAGNOSIS — F1010 Alcohol abuse, uncomplicated: Secondary | ICD-10-CM | POA: Diagnosis present

## 2023-10-08 DIAGNOSIS — I509 Heart failure, unspecified: Secondary | ICD-10-CM | POA: Diagnosis not present

## 2023-10-08 DIAGNOSIS — M109 Gout, unspecified: Secondary | ICD-10-CM | POA: Diagnosis not present

## 2023-10-08 DIAGNOSIS — Z7901 Long term (current) use of anticoagulants: Secondary | ICD-10-CM | POA: Diagnosis not present

## 2023-10-08 DIAGNOSIS — Z79899 Other long term (current) drug therapy: Secondary | ICD-10-CM

## 2023-10-08 DIAGNOSIS — Z91128 Patient's intentional underdosing of medication regimen for other reason: Secondary | ICD-10-CM

## 2023-10-08 DIAGNOSIS — I428 Other cardiomyopathies: Secondary | ICD-10-CM | POA: Diagnosis present

## 2023-10-08 DIAGNOSIS — Z91148 Patient's other noncompliance with medication regimen for other reason: Secondary | ICD-10-CM

## 2023-10-08 DIAGNOSIS — K219 Gastro-esophageal reflux disease without esophagitis: Secondary | ICD-10-CM | POA: Diagnosis not present

## 2023-10-08 DIAGNOSIS — I4891 Unspecified atrial fibrillation: Secondary | ICD-10-CM | POA: Diagnosis not present

## 2023-10-08 DIAGNOSIS — I110 Hypertensive heart disease with heart failure: Secondary | ICD-10-CM | POA: Diagnosis not present

## 2023-10-08 DIAGNOSIS — I081 Rheumatic disorders of both mitral and tricuspid valves: Secondary | ICD-10-CM | POA: Diagnosis present

## 2023-10-08 DIAGNOSIS — Q2112 Patent foramen ovale: Secondary | ICD-10-CM

## 2023-10-08 DIAGNOSIS — I480 Paroxysmal atrial fibrillation: Secondary | ICD-10-CM | POA: Diagnosis not present

## 2023-10-08 DIAGNOSIS — Z883 Allergy status to other anti-infective agents status: Secondary | ICD-10-CM

## 2023-10-08 DIAGNOSIS — Z87891 Personal history of nicotine dependence: Secondary | ICD-10-CM | POA: Diagnosis not present

## 2023-10-08 DIAGNOSIS — N1831 Chronic kidney disease, stage 3a: Secondary | ICD-10-CM | POA: Diagnosis present

## 2023-10-08 DIAGNOSIS — R570 Cardiogenic shock: Secondary | ICD-10-CM | POA: Diagnosis not present

## 2023-10-08 DIAGNOSIS — N4889 Other specified disorders of penis: Secondary | ICD-10-CM | POA: Diagnosis present

## 2023-10-08 DIAGNOSIS — I3139 Other pericardial effusion (noninflammatory): Secondary | ICD-10-CM | POA: Diagnosis present

## 2023-10-08 DIAGNOSIS — F32A Depression, unspecified: Secondary | ICD-10-CM | POA: Diagnosis present

## 2023-10-08 DIAGNOSIS — I5041 Acute combined systolic (congestive) and diastolic (congestive) heart failure: Secondary | ICD-10-CM | POA: Diagnosis not present

## 2023-10-08 DIAGNOSIS — F419 Anxiety disorder, unspecified: Secondary | ICD-10-CM | POA: Diagnosis present

## 2023-10-08 DIAGNOSIS — I4819 Other persistent atrial fibrillation: Secondary | ICD-10-CM | POA: Diagnosis not present

## 2023-10-08 DIAGNOSIS — Z800 Family history of malignant neoplasm of digestive organs: Secondary | ICD-10-CM | POA: Diagnosis not present

## 2023-10-08 DIAGNOSIS — M7989 Other specified soft tissue disorders: Secondary | ICD-10-CM | POA: Diagnosis not present

## 2023-10-08 DIAGNOSIS — R9389 Abnormal findings on diagnostic imaging of other specified body structures: Secondary | ICD-10-CM | POA: Diagnosis not present

## 2023-10-08 DIAGNOSIS — R0989 Other specified symptoms and signs involving the circulatory and respiratory systems: Secondary | ICD-10-CM | POA: Diagnosis not present

## 2023-10-08 DIAGNOSIS — R0602 Shortness of breath: Secondary | ICD-10-CM | POA: Diagnosis not present

## 2023-10-08 DIAGNOSIS — R609 Edema, unspecified: Secondary | ICD-10-CM | POA: Diagnosis not present

## 2023-10-08 LAB — COMPREHENSIVE METABOLIC PANEL WITH GFR
ALT: 8 U/L (ref 0–44)
AST: 18 U/L (ref 15–41)
Albumin: 3.2 g/dL — ABNORMAL LOW (ref 3.5–5.0)
Alkaline Phosphatase: 52 U/L (ref 38–126)
Anion gap: 12 (ref 5–15)
BUN: 14 mg/dL (ref 8–23)
CO2: 25 mmol/L (ref 22–32)
Calcium: 8.5 mg/dL — ABNORMAL LOW (ref 8.9–10.3)
Chloride: 101 mmol/L (ref 98–111)
Creatinine, Ser: 1.34 mg/dL — ABNORMAL HIGH (ref 0.61–1.24)
GFR, Estimated: 59 mL/min — ABNORMAL LOW (ref >60)
Glucose, Bld: 87 mg/dL (ref 70–99)
Potassium: 4.2 mmol/L (ref 3.5–5.1)
Sodium: 138 mmol/L (ref 135–145)
Total Bilirubin: 2.3 mg/dL — ABNORMAL HIGH (ref 0.0–1.2)
Total Protein: 6.7 g/dL (ref 6.5–8.1)

## 2023-10-08 LAB — TROPONIN I (HIGH SENSITIVITY)
Troponin I (High Sensitivity): 22 ng/L — ABNORMAL HIGH (ref <18)
Troponin I (High Sensitivity): 22 ng/L — ABNORMAL HIGH (ref <18)

## 2023-10-08 LAB — CBC
HCT: 34.1 % — ABNORMAL LOW (ref 39.0–52.0)
Hemoglobin: 10.9 g/dL — ABNORMAL LOW (ref 13.0–17.0)
MCH: 32.3 pg (ref 26.0–34.0)
MCHC: 32 g/dL (ref 30.0–36.0)
MCV: 101.2 fL — ABNORMAL HIGH (ref 80.0–100.0)
Platelets: 149 K/uL — ABNORMAL LOW (ref 150–400)
RBC: 3.37 MIL/uL — ABNORMAL LOW (ref 4.22–5.81)
RDW: 14.5 % (ref 11.5–15.5)
WBC: 5.3 K/uL (ref 4.0–10.5)
nRBC: 0 % (ref 0.0–0.2)

## 2023-10-08 LAB — BRAIN NATRIURETIC PEPTIDE: B Natriuretic Peptide: 522.8 pg/mL — ABNORMAL HIGH (ref 0.0–100.0)

## 2023-10-08 MED ORDER — FOLIC ACID 1 MG PO TABS
1.0000 mg | ORAL_TABLET | Freq: Every day | ORAL | Status: DC
  Administered 2023-10-08 – 2023-10-15 (×8): 1 mg via ORAL
  Filled 2023-10-08 (×8): qty 1

## 2023-10-08 MED ORDER — APIXABAN 5 MG PO TABS: 5.0000 mg | ORAL_TABLET | Freq: Two times a day (BID) | ORAL | Status: DC

## 2023-10-08 MED ORDER — APIXABAN 5 MG PO TABS
5.0000 mg | ORAL_TABLET | Freq: Two times a day (BID) | ORAL | Status: DC
  Administered 2023-10-08 – 2023-10-15 (×14): 5 mg via ORAL
  Filled 2023-10-08 (×14): qty 1

## 2023-10-08 MED ORDER — FUROSEMIDE 10 MG/ML IJ SOLN
60.0000 mg | Freq: Two times a day (BID) | INTRAMUSCULAR | Status: DC
  Administered 2023-10-09 (×2): 60 mg via INTRAVENOUS
  Filled 2023-10-08 (×2): qty 6

## 2023-10-08 MED ORDER — DILTIAZEM LOAD VIA INFUSION
15.0000 mg | Freq: Once | INTRAVENOUS | Status: AC
  Administered 2023-10-08: 15 mg via INTRAVENOUS
  Filled 2023-10-08: qty 15

## 2023-10-08 MED ORDER — THIAMINE MONONITRATE 100 MG PO TABS
100.0000 mg | ORAL_TABLET | Freq: Every day | ORAL | Status: DC
  Administered 2023-10-08 – 2023-10-15 (×8): 100 mg via ORAL
  Filled 2023-10-08 (×8): qty 1

## 2023-10-08 MED ORDER — LORAZEPAM 2 MG/ML IJ SOLN: 1.0000 mg | INTRAMUSCULAR | Status: AC | PRN

## 2023-10-08 MED ORDER — THIAMINE HCL 100 MG/ML IJ SOLN
100.0000 mg | Freq: Every day | INTRAMUSCULAR | Status: DC
  Filled 2023-10-08: qty 2

## 2023-10-08 MED ORDER — DILTIAZEM HCL-DEXTROSE 125-5 MG/125ML-% IV SOLN (PREMIX)
5.0000 mg/h | INTRAVENOUS | Status: DC
  Administered 2023-10-08: 5 mg/h via INTRAVENOUS
  Filled 2023-10-08: qty 125

## 2023-10-08 MED ORDER — ADULT MULTIVITAMIN W/MINERALS CH
1.0000 | ORAL_TABLET | Freq: Every day | ORAL | Status: DC
Start: 2023-10-08 — End: 2023-10-15
  Administered 2023-10-08 – 2023-10-15 (×8): 1 via ORAL
  Filled 2023-10-08 (×8): qty 1

## 2023-10-08 MED ORDER — FUROSEMIDE 10 MG/ML IJ SOLN
40.0000 mg | Freq: Once | INTRAMUSCULAR | Status: AC
  Administered 2023-10-08: 40 mg via INTRAVENOUS
  Filled 2023-10-08: qty 4

## 2023-10-08 MED ORDER — LORAZEPAM 1 MG PO TABS: 1.0000 mg | ORAL_TABLET | ORAL | Status: AC | PRN

## 2023-10-08 MED ORDER — ALLOPURINOL 100 MG PO TABS
100.0000 mg | ORAL_TABLET | Freq: Every day | ORAL | Status: DC
  Administered 2023-10-08 – 2023-10-15 (×8): 100 mg via ORAL
  Filled 2023-10-08 (×8): qty 1
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
## 2023-10-08 NOTE — ED Provider Notes (Signed)
 [place] EMERGENCY DEPARTMENT AT [facility] Provider Note   CSN: [id number] Arrival date & time: 10/08/23  1101     Patient presents with: Shortness of Breath and Leg Swelling   Justin Fleming is a 63 y.o. male.   The history is provided by the patient and medical records. No language interpreter was used.  Shortness of Breath    63 year old male history of CHF, alcohol abuse, persistent atrial fibrillation currently on Eliquis , hypertension presenting complaining of fluid retention.  Patient states for the past week he noticed progressive worsening bilateral leg swelling and now having scrotal swelling.  He also endorsed increased shortness of breath with exertion.  He does not endorse any fever or chills no chest pain no nausea vomiting diarrhea and no dysuria.  He is on diuretic and has been compliant with that.  He has not taken his Eliquis  for the past month because he ran out of it.  He admits to some alcohol use recently.  Denies tobacco use.  His primary concern is swelling to his scrotum which is new.  Prior to Admission medications   Medication Sig Start Date End Date Taking? Authorizing Provider  acetaminophen  (TYLENOL ) 500 MG tablet Take 500 mg by mouth every 6 (six) hours as needed for moderate pain (pain score 4-6).    [provider]  allopurinol (ZYLOPRIM) 100 MG tablet Take 100 mg by mouth daily.    [provider]  amiodarone  (PACERONE ) 200 MG tablet Take 1 tablet (200 mg total) by mouth daily. 03/29/23   Milford, Harlene HERO, FNP  apixaban  (ELIQUIS ) 5 MG TABS tablet Take 1 tablet (5 mg total) by mouth 2 (two) times daily. 03/29/23   Milford, Harlene HERO, FNP  cyanocobalamin  (VITAMIN B12) 500 MCG tablet Take 1 tablet (500 mcg total) by mouth daily. 01/15/23   Evonnie Lenis, MD  Cyanocobalamin  500 MCG CHEW Chew 500 mg by mouth daily. 02/27/23   [provider]  dapagliflozin  propanediol (FARXIGA ) 10 MG TABS tablet Take 1 tablet (10 mg total)  by mouth daily before breakfast. 03/29/23   Glena Harlene HERO, FNP  ferrous sulfate  325 (65 FE) MG tablet Take 1 tablet (325 mg total) by mouth 2 (two) times daily with a meal. 04/08/23 06/01/23  Kayla Jeoffrey RAMAN, FNP  folic acid  (FOLVITE ) 1 MG tablet Take 1 tablet (1 mg total) by mouth daily. 01/15/23   Evonnie Lenis, MD  furosemide  (LASIX ) 40 MG tablet Take 1 tablet (40 mg total) by mouth daily. 03/29/23   Milford, Harlene HERO, FNP  losartan  (COZAAR ) 25 MG tablet Take 0.5 tablets (12.5 mg total) by mouth daily. 04/27/23 07/26/23  Glena Harlene HERO, FNP  metoprolol  succinate (TOPROL  XL) 25 MG 24 hr tablet Take 0.5 tablets (12.5 mg total) by mouth at bedtime. 04/27/23   Glena Harlene HERO, FNP  pantoprazole  (PROTONIX ) 40 MG tablet Take 1 tablet (40 mg total) by mouth 2 (two) times daily. 01/15/23   Evonnie Lenis, MD  spironolactone  (ALDACTONE ) 25 MG tablet Take 0.5 tablets (12.5 mg total) by mouth daily. 03/29/23   Glena Harlene HERO, FNP    Allergies: Chlorhexidine  gluconate    Review of Systems  Respiratory:  Positive for shortness of breath.   All other systems reviewed and are negative.   Updated Vital Signs BP 103/87   Pulse 70   Temp 97.7 F (36.5 C)   Resp 18   Ht 5' 11 (1.803 m)   SpO2 96%   BMI 26.61 kg/m  Physical Exam Constitutional:      General: He is not in acute distress.    Appearance: He is well-developed.  HENT:     Head: Atraumatic.  Eyes:     Conjunctiva/sclera: Conjunctivae normal.  Cardiovascular:     Rate and Rhythm: Tachycardia present.  Pulmonary:     Effort: Pulmonary effort is normal.     Breath sounds: Normal breath sounds. No wheezing, rhonchi or rales.  Abdominal:     Palpations: Abdomen is soft.  Genitourinary:    Comments: Chaperone present during exam.  Anasarca noted to scrotum Musculoskeletal:     Cervical back: Normal range of motion and neck supple.     Right lower leg: Edema present.     Left lower leg: Edema present.  Skin:    Findings: No  rash.  Neurological:     Mental Status: He is alert. Mental status is at baseline.     (all labs ordered are listed, but only abnormal results are displayed) Labs Reviewed  BRAIN NATRIURETIC PEPTIDE - Abnormal; Notable for the following components:      Result Value   B Natriuretic Peptide 522.8 (*)    All other components within normal limits  COMPREHENSIVE METABOLIC PANEL WITH GFR - Abnormal; Notable for the following components:   Creatinine, Ser 1.34 (*)    Calcium 8.5 (*)    Albumin  3.2 (*)    Total Bilirubin 2.3 (*)    GFR, Estimated 59 (*)    All other components within normal limits  TROPONIN I (HIGH SENSITIVITY) - Abnormal; Notable for the following components:   Troponin I (High Sensitivity) 22 (*)    All other components within normal limits  CBC  TROPONIN I (HIGH SENSITIVITY)    EKG: EKG Interpretation Date/Time:  Friday October 08 2023 11:51:43 EDT Ventricular Rate:  145 PR Interval:    QRS Duration:  105 QT Interval:  331 QTC Calculation: 515 R Axis:   -66  Text Interpretation: recurrent Atrial fibrillation Left anterior fascicular block Probable anteroseptal infarct, old Nonspecific T abnormalities, lateral leads Prolonged QT interval Confirmed by Doretha Folks ([id number]) on 10/08/2023 1:39:12 PM  Radiology: [facility] Chest Portable 1 View Result Date: 10/08/2023 CLINICAL DATA:  Shortness of breath and lower extremity swelling. EXAM: PORTABLE CHEST 1 VIEW COMPARISON:  02/16/2023 FINDINGS: Patient is rotated to the right. Lungs are hypoinflated without acute airspace consolidation or effusion. No significant interstitial edema. Mild stable cardiomegaly. Remainder of the exam is unchanged. IMPRESSION: 1. Hypoinflation without acute cardiopulmonary disease. 2. Mild stable cardiomegaly. Electronically Signed   By: Toribio Agreste M.D.   On: 10/08/2023 12:20     .Critical Care  Performed by: Nivia Colon, PA-C Authorized by: Nivia Colon, PA-C   Critical care provider  statement:    Critical care time (minutes):  30   Critical care was time spent personally by me on the following activities:  Development of treatment plan with patient or surrogate, discussions with consultants, evaluation of patient's response to treatment, examination of patient, ordering and review of laboratory studies, ordering and review of radiographic studies, ordering and performing treatments and interventions, pulse oximetry, re-evaluation of patient's condition and review of old charts    Medications Ordered in the ED  diltiazem  (CARDIZEM ) 1 mg/mL load via infusion 15 mg (15 mg Intravenous Bolus from Bag 10/08/23 1223)    And  diltiazem  (CARDIZEM ) 125 mg in dextrose  5% 125 mL (1 mg/mL) infusion (5 mg/hr Intravenous New Bag/Given 10/08/23 1223)  furosemide  (LASIX )  injection 40 mg (has no administration in time range)                                    Medical Decision Making Amount and/or Complexity of Data Reviewed Labs: ordered. Radiology: ordered.  Risk Prescription drug management. Decision regarding hospitalization.   BP 103/87   Pulse 70   Temp 97.7 F (36.5 C)   Resp 18   Ht 5' 11 (1.803 m)   SpO2 96%   BMI 26.61 kg/m   91:6 AM 63 year old male history of CHF, alcohol abuse, persistent atrial fibrillation currently on Eliquis , hypertension presenting complaining of fluid retention.  Patient states for the past week he noticed progressive worsening bilateral leg swelling and now having scrotal swelling.  He also endorsed increased shortness of breath with exertion.  He does not endorse any fever or chills no chest pain no nausea vomiting diarrhea and no dysuria.  He is on diuretic and has been compliant with that.  He has not taken his Eliquis  for the past month because he ran out of it.  He admits to some alcohol use recently.  Denies tobacco use.  His primary concern is swelling to his scrotum which is new.  Exam notable for tachycardia with a heart rate in  the 140s patient does have significant anasarca involving his scrotum as well as bilateral lower extremities.  He has diminished lung sounds.  He is not in extremis.    -Labs ordered, independently viewed and interpreted by me.  Labs remarkable for BNP is elevated at 522, diuresis given.  Troponin is 22 likely demand ischemia.  Creatinine is 1.34. -The patient was maintained on a cardiac monitor.  I personally viewed and interpreted the cardiac monitored which showed an underlying rhythm of: A-fib with RVR with heart rate in the 130s.  Will initiate diltiazem  bolus and drip. -Imaging independently viewed and interpreted by me and I agree with radiologist's interpretation.  Result remarkable for chest x-ray shows mild stable cardiomegaly without significant pleural effusion -This patient presents to the ED for concern of shortness of breath, this involves an extensive number of treatment options, and is a complaint that carries with it a high risk of complications and morbidity.  The differential diagnosis includes CHF exacerbation, anasarca, liver failure, -Co morbidities that complicate the patient evaluation includes CHF, alcohol abuse, A-fib -Treatment includes Lasix , diltiazem  -Reevaluation of the patient after these medicines showed that the patient improved -PCP office notes or outside notes reviewed -Discussion with specialist [facility] Dr. Georgina who agrees to admit pt -Escalation to admission/observation considered: pt is agreeable with admission.        Final diagnoses:  Acute on chronic congestive heart failure, unspecified heart failure type [facility])  Atrial fibrillation with RVR [facility])    ED Discharge Orders     None          Nivia Colon, PA-C 10/08/23 1509    Doretha Folks, MD 10/09/23 [phone]

## 2023-10-08 NOTE — ED Triage Notes (Signed)
 Pt. Stated, I started swelling since last week. It started at my feet and moved all the way up to my groin and now that is swollen . Im also SOB .

## 2023-10-08 NOTE — ED Notes (Signed)
 Courtesy call made to floor prior to transport

## 2023-10-08 NOTE — Treatment Plan (Signed)
 Will admit, but awaiting CBC before deciding on level of care with RVR, soft pressures.

## 2023-10-08 NOTE — ED Triage Notes (Signed)
 Pt to ER with c/o of SHOB and leg swelling for last week.  Hx of CHF.

## 2023-10-09 ENCOUNTER — Inpatient Hospital Stay (HOSPITAL_COMMUNITY)

## 2023-10-09 DIAGNOSIS — I4891 Unspecified atrial fibrillation: Secondary | ICD-10-CM

## 2023-10-09 DIAGNOSIS — Z91148 Patient's other noncompliance with medication regimen for other reason: Secondary | ICD-10-CM

## 2023-10-09 DIAGNOSIS — F1010 Alcohol abuse, uncomplicated: Secondary | ICD-10-CM | POA: Diagnosis not present

## 2023-10-09 DIAGNOSIS — R609 Edema, unspecified: Secondary | ICD-10-CM

## 2023-10-09 DIAGNOSIS — I5041 Acute combined systolic (congestive) and diastolic (congestive) heart failure: Secondary | ICD-10-CM

## 2023-10-09 DIAGNOSIS — I509 Heart failure, unspecified: Secondary | ICD-10-CM | POA: Diagnosis not present

## 2023-10-09 LAB — CBC
HCT: 32.8 % — ABNORMAL LOW (ref 39.0–52.0)
Hemoglobin: 10.6 g/dL — ABNORMAL LOW (ref 13.0–17.0)
MCH: 32.2 pg (ref 26.0–34.0)
MCHC: 32.3 g/dL (ref 30.0–36.0)
MCV: 99.7 fL (ref 80.0–100.0)
Platelets: 145 K/uL — ABNORMAL LOW (ref 150–400)
RBC: 3.29 MIL/uL — ABNORMAL LOW (ref 4.22–5.81)
RDW: 14.4 % (ref 11.5–15.5)
WBC: 4.8 K/uL (ref 4.0–10.5)
nRBC: 0 % (ref 0.0–0.2)

## 2023-10-09 LAB — COMPREHENSIVE METABOLIC PANEL WITH GFR
ALT: 7 U/L (ref 0–44)
AST: 10 U/L — ABNORMAL LOW (ref 15–41)
Albumin: 3.1 g/dL — ABNORMAL LOW (ref 3.5–5.0)
Alkaline Phosphatase: 46 U/L (ref 38–126)
Anion gap: 10 (ref 5–15)
BUN: 15 mg/dL (ref 8–23)
CO2: 28 mmol/L (ref 22–32)
Calcium: 8.7 mg/dL — ABNORMAL LOW (ref 8.9–10.3)
Chloride: 100 mmol/L (ref 98–111)
Creatinine, Ser: 1.37 mg/dL — ABNORMAL HIGH (ref 0.61–1.24)
GFR, Estimated: 57 mL/min — ABNORMAL LOW (ref >60)
Glucose, Bld: 101 mg/dL — ABNORMAL HIGH (ref 70–99)
Potassium: 4 mmol/L (ref 3.5–5.1)
Sodium: 138 mmol/L (ref 135–145)
Total Bilirubin: 1.9 mg/dL — ABNORMAL HIGH (ref 0.0–1.2)
Total Protein: 6.4 g/dL — ABNORMAL LOW (ref 6.5–8.1)

## 2023-10-09 LAB — IRON AND TIBC
Iron: 20 ug/dL — ABNORMAL LOW (ref 45–182)
Saturation Ratios: 8 % — ABNORMAL LOW (ref 17.9–39.5)
TIBC: 244 ug/dL — ABNORMAL LOW (ref 250–450)
UIBC: 224 ug/dL

## 2023-10-09 LAB — ECHOCARDIOGRAM COMPLETE
AR max vel: 2.47 cm2
AV Peak grad: 2.9 mmHg
Ao pk vel: 0.85 m/s
Calc EF: 29.5 %
Est EF: <20
Height: 71 in
S' Lateral: 4.4 cm
Single Plane A2C EF: 31.2 %
Single Plane A4C EF: 29 %
Weight: 3393.32 [oz_av]

## 2023-10-09 LAB — FERRITIN: Ferritin: 184 ng/mL (ref 24–336)

## 2023-10-09 LAB — TSH: TSH: 2.292 u[IU]/mL (ref 0.350–4.500)

## 2023-10-09 LAB — FOLATE: Folate: >20 ng/mL (ref >5.9)

## 2023-10-09 LAB — VITAMIN B12: Vitamin B-12: 310 pg/mL (ref 180–914)

## 2023-10-09 MED ORDER — FUROSEMIDE 10 MG/ML IJ SOLN
20.0000 mg/h | INTRAVENOUS | Status: DC
  Administered 2023-10-09 – 2023-10-10 (×2): 10 mg/h via INTRAVENOUS
  Administered 2023-10-10 – 2023-10-11 (×2): 20 mg/h via INTRAVENOUS
  Filled 2023-10-09 (×5): qty 20

## 2023-10-09 MED ORDER — AMIODARONE HCL IN DEXTROSE 360-4.14 MG/200ML-% IV SOLN
30.0000 mg/h | INTRAVENOUS | Status: DC
  Administered 2023-10-09 – 2023-10-11 (×4): 30 mg/h via INTRAVENOUS
  Administered 2023-10-11 – 2023-10-13 (×7): 60 mg/h via INTRAVENOUS
  Administered 2023-10-13 – 2023-10-14 (×2): 30 mg/h via INTRAVENOUS
  Filled 2023-10-09: qty 200
  Filled 2023-10-09: qty 400
  Filled 2023-10-09 (×12): qty 200

## 2023-10-09 MED ORDER — AMIODARONE HCL IN DEXTROSE 360-4.14 MG/200ML-% IV SOLN
60.0000 mg/h | INTRAVENOUS | Status: AC
  Administered 2023-10-09 (×2): 60 mg/h via INTRAVENOUS
  Filled 2023-10-09: qty 200

## 2023-10-09 MED ORDER — PERFLUTREN LIPID MICROSPHERE
1.0000 mL | INTRAVENOUS | Status: AC | PRN
  Administered 2023-10-09: 2 mL via INTRAVENOUS

## 2023-10-09 MED ORDER — FUROSEMIDE 10 MG/ML IJ SOLN
40.0000 mg | Freq: Once | INTRAMUSCULAR | Status: AC
  Administered 2023-10-09: 40 mg via INTRAVENOUS
  Filled 2023-10-09: qty 4

## 2023-10-09 MED ORDER — AMIODARONE LOAD VIA INFUSION
150.0000 mg | Freq: Once | INTRAVENOUS | Status: AC
  Administered 2023-10-09: 150 mg via INTRAVENOUS
  Filled 2023-10-09: qty 83.34

## 2023-10-09 NOTE — Progress Notes (Signed)
 Echocardiogram 2D Echocardiogram has been performed with definity .  Audi Wettstein N Gardner Servantes,RDCS 10/09/2023, 11:26 AM

## 2023-10-09 NOTE — Progress Notes (Addendum)
 PROGRESS NOTE    Justin Fleming  FMW:[id number] DOB: Sep 27, 1960 DOA: 10/08/2023 PCP: Justin Jeoffrey RAMAN, FNP  Chief Complaint  Patient presents with   Shortness of Breath   Leg Swelling    Brief Narrative:   Justin Fleming is Justin Fleming 63 y.o. male with medical history significant of HFrecEF, atrial fibrillation, etoh abuse, GERD and multiple other medical issues here with leg and scrotal swelling.   Admitted for HF exacerbation and afib with RVR.  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)  Acute Heart Failure Exacerbation  HFrecEF with Exacerbation Volume Overload  Dyspnea on Exertion CXR without acute cardiopulm disease Elevated BNP, significant LE edema to bilateral LE's extending proximally to thighs and includes his penis/scrotum He'd not been taking his chronic meds, only recently restarted PO lasix  Continue IV lasix  BID (in February they used 60 mg IV and transitioned him to 80 mg PO) Repeat echo - 2/16 echo showed EF 60-65%, severe LVH, mildly reduced RVSF (this was improved from 2/6 when his EF was 30-35%) - he's in RVR, unclear how long he may have been in RVR, risk of tachy mediated cardiomyopathy Hold off on resuming SGLT2 with soft BP, scrotal swelling.  Holding losartan , metoprolol , spironolactone  with soft BP. Cardiology c/s in setting of soft BP's and RVR (required milrinone  and RHC) Strict I/O, daily weights   Atrial Fibrillation with RVR Didn't tolerate diltiazem  due to BP Will start amiodarone  this AM Resume eliquis   Consult cardiology (required DCCV in February)   Mildly Elevated Troponin Flat, not c/w ACS Follow echo    Borderline Blood Pressures Appreciate cardiology assistance    CKD IIIa Appears consistent with prior  Watch with diuresis    Hypertension BP currently soft Holding BP meds   Gout Resume allopurinol   Etoh Abuse By his report, not drinking everyday, but still binge  drinking CIWA   GERD PPI    Anemia, Macrocytic Labs with low iron, normal ferritin.  Normal B12 and folate.     He wasn't taking any of his meds.  He'd self discontinued all his meds over the past several months.  Only taking lasix  in past few weeks after he developed LE edema.     DVT prophylaxis: eliquis  Code Status: full Family Communication: none Disposition:   Status is: Inpatient Remains inpatient appropriate because: need for continued inpatient care   Consultants:  cardiology  Procedures:  none  Antimicrobials:  Anti-infectives (From admission, onward)    None       Subjective: Continued DOE   Objective: Vitals:   10/09/23 0530 10/09/23 0640 10/09/23 0745 10/09/23 0758  BP: 91/73 95/72 102/83 103/83  Pulse:   100 (!) 164  Resp:   17   Temp:   98.4 F (36.9 C)   TempSrc:   Oral   SpO2:   96%   Weight:      Height:        Intake/Output Summary (Last 24 hours) at 10/09/2023 1126 Last data filed at 10/09/2023 0914 Gross per 24 hour  Intake 304.96 ml  Output --  Net 304.96 ml   Filed Weights   10/08/23 1800  Weight: 96.2 kg    Examination:  General exam: Appears calm and comfortable  Respiratory system: bibasilar crackles Cardiovascular system: tachycardic, irregularly irregular  Gastrointestinal system: Abdomen is nondistended, soft and nontender. Central nervous system: Alert and oriented. No focal neurological deficits. Extremities: bilateral LE edema to thigh  Data Reviewed: I have personally reviewed following labs and imaging studies  CBC: Recent Labs  Lab 10/08/23 1450 10/09/23 0310  WBC 5.3 4.8  HGB 10.9* 10.6*  HCT 34.1* 32.8*  MCV 101.2* 99.7  PLT 149* 145*    Basic Metabolic Panel: Recent Labs  Lab 10/08/23 1205 10/09/23 0310  NA 138 138  K 4.2 4.0  CL 101 100  CO2 25 28  GLUCOSE 87 101*  BUN 14 15  CREATININE 1.34* 1.37*  CALCIUM 8.5* 8.7*    GFR: Estimated Creatinine Clearance: 63.6 mL/min (Dow Blahnik) (by  C-G formula based on SCr of 1.37 mg/dL (H)).  Liver Function Tests: Recent Labs  Lab 10/08/23 1205 10/09/23 0310  AST 18 10*  ALT 8 7  ALKPHOS 52 46  BILITOT 2.3* 1.9*  PROT 6.7 6.4*  ALBUMIN  3.2* 3.1*    CBG: No results for input(s): GLUCAP in the last 168 hours.   No results found for this or any previous visit (from the past 240 hours).       Radiology Studies: DG Chest Portable 1 View Result Date: 10/08/2023 CLINICAL DATA:  Shortness of breath and lower extremity swelling. EXAM: PORTABLE CHEST 1 VIEW COMPARISON:  02/16/2023 FINDINGS: Patient is rotated to the right. Lungs are hypoinflated without acute airspace consolidation or effusion. No significant interstitial edema. Mild stable cardiomegaly. Remainder of the exam is unchanged. IMPRESSION: 1. Hypoinflation without acute cardiopulmonary disease. 2. Mild stable cardiomegaly. Electronically Signed   By: Toribio Agreste M.D.   On: 10/08/2023 12:20        Scheduled Meds:  allopurinol  100 mg Oral Daily   apixaban   5 mg Oral BID   folic acid   1 mg Oral Daily   furosemide   60 mg Intravenous BID   multivitamin with minerals  1 tablet Oral Daily   thiamine   100 mg Oral Daily   Or   thiamine   100 mg Intravenous Daily   Continuous Infusions:  amiodarone  60 mg/hr (10/09/23 0947)   Followed by   amiodarone      diltiazem  (CARDIZEM ) infusion Stopped (10/08/23 2032)     LOS: 1 day    Time spent: over 86m in     Meliton Monte, MD [facility]   To contact the attending provider between 7A-7P or the covering provider during after hours 7P-7A, please log into the web site [email or web address] and access using universal [place] password for that web site. If you do not have the password, please call the hospital operator.  10/09/2023, 11:26 AM

## 2023-10-09 NOTE — Plan of Care (Signed)
  Problem: Education: Goal: Knowledge of General Education information will improve Description: Including pain rating scale, medication(s)/side effects and non-pharmacologic comfort measures Outcome: Progressing   Problem: Clinical Measurements: Goal: Ability to maintain clinical measurements within normal limits will improve Outcome: Progressing   Problem: Clinical Measurements: Goal: Will remain free from infection Outcome: Progressing   Problem: Clinical Measurements: Goal: Diagnostic test results will improve Outcome: Progressing   Problem: Clinical Measurements: Goal: Cardiovascular complication will be avoided Outcome: Progressing   Problem: Activity: Goal: Risk for activity intolerance will decrease Outcome: Progressing   Problem: Nutrition: Goal: Adequate nutrition will be maintained Outcome: Progressing   Problem: Pain Managment: Goal: General experience of comfort will improve and/or be controlled Outcome: Progressing   Problem: Safety: Goal: Ability to remain free from injury will improve Outcome: Progressing
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
## 2023-10-09 NOTE — Progress Notes (Signed)
 Bilateral lower extremity venous duplex has been completed. Preliminary results can be found in CV Proc through chart review.   10/09/23 2:36 PM Cathlyn Collet RVT

## 2023-10-10 ENCOUNTER — Inpatient Hospital Stay (HOSPITAL_COMMUNITY)

## 2023-10-10 ENCOUNTER — Other Ambulatory Visit: Payer: Self-pay

## 2023-10-10 DIAGNOSIS — I5023 Acute on chronic systolic (congestive) heart failure: Secondary | ICD-10-CM

## 2023-10-10 DIAGNOSIS — I4891 Unspecified atrial fibrillation: Secondary | ICD-10-CM | POA: Diagnosis not present

## 2023-10-10 DIAGNOSIS — I5041 Acute combined systolic (congestive) and diastolic (congestive) heart failure: Secondary | ICD-10-CM | POA: Diagnosis not present

## 2023-10-10 DIAGNOSIS — F1010 Alcohol abuse, uncomplicated: Secondary | ICD-10-CM | POA: Diagnosis not present

## 2023-10-10 DIAGNOSIS — I509 Heart failure, unspecified: Secondary | ICD-10-CM | POA: Diagnosis not present

## 2023-10-10 LAB — COOXEMETRY PANEL
Carboxyhemoglobin: 2.4 % — ABNORMAL HIGH (ref 0.5–1.5)
Carboxyhemoglobin: 2.3 % — ABNORMAL HIGH (ref 0.5–1.5)
Methemoglobin: <0.7 % (ref 0.0–1.5)
Methemoglobin: <0.7 % (ref 0.0–1.5)
O2 Saturation: 58 %
O2 Saturation: 60.1 %
Total hemoglobin: 11.3 g/dL — ABNORMAL LOW (ref 12.0–16.0)
Total hemoglobin: 10.9 g/dL — ABNORMAL LOW (ref 12.0–16.0)

## 2023-10-10 LAB — MRSA NEXT GEN BY PCR, NASAL: MRSA by PCR Next Gen: NOT DETECTED

## 2023-10-10 LAB — CBC
HCT: 35.1 % — ABNORMAL LOW (ref 39.0–52.0)
Hemoglobin: 11.4 g/dL — ABNORMAL LOW (ref 13.0–17.0)
MCH: 32.1 pg (ref 26.0–34.0)
MCHC: 32.5 g/dL (ref 30.0–36.0)
MCV: 98.9 fL (ref 80.0–100.0)
Platelets: 166 K/uL (ref 150–400)
RBC: 3.55 MIL/uL — ABNORMAL LOW (ref 4.22–5.81)
RDW: 14.4 % (ref 11.5–15.5)
WBC: 5.6 K/uL (ref 4.0–10.5)
nRBC: 0 % (ref 0.0–0.2)

## 2023-10-10 LAB — BASIC METABOLIC PANEL WITH GFR
Anion gap: 13 (ref 5–15)
BUN: 19 mg/dL (ref 8–23)
CO2: 26 mmol/L (ref 22–32)
Calcium: 8.7 mg/dL — ABNORMAL LOW (ref 8.9–10.3)
Chloride: 97 mmol/L — ABNORMAL LOW (ref 98–111)
Creatinine, Ser: 1.52 mg/dL — ABNORMAL HIGH (ref 0.61–1.24)
GFR, Estimated: 51 mL/min — ABNORMAL LOW (ref >60)
Glucose, Bld: 105 mg/dL — ABNORMAL HIGH (ref 70–99)
Potassium: 3.7 mmol/L (ref 3.5–5.1)
Sodium: 136 mmol/L (ref 135–145)

## 2023-10-10 MED ORDER — POTASSIUM CHLORIDE CRYS ER 20 MEQ PO TBCR
40.0000 meq | EXTENDED_RELEASE_TABLET | Freq: Once | ORAL | Status: AC
  Administered 2023-10-10: 40 meq via ORAL
  Filled 2023-10-10: qty 2

## 2023-10-10 MED ORDER — ORAL CARE MOUTH RINSE: 15.0000 mL | OROMUCOSAL | Status: DC | PRN

## 2023-10-10 MED ORDER — MILRINONE LACTATE IN DEXTROSE 20-5 MG/100ML-% IV SOLN: 0.1250 ug/kg/min | INTRAVENOUS | Status: DC

## 2023-10-10 MED ORDER — SODIUM CHLORIDE 0.9 % IV SOLN: 250.0000 mL | INTRAVENOUS | Status: AC

## 2023-10-10 MED ORDER — NOREPINEPHRINE 4 MG/250ML-% IV SOLN
0.0000 ug/min | INTRAVENOUS | Status: DC
  Filled 2023-10-10: qty 250

## 2023-10-10 MED ORDER — SODIUM CHLORIDE 0.9% FLUSH: 10.0000 mL | INTRAVENOUS | Status: DC | PRN

## 2023-10-10 MED ORDER — SODIUM CHLORIDE 0.9% FLUSH
10.0000 mL | Freq: Two times a day (BID) | INTRAVENOUS | Status: DC
  Administered 2023-10-10: 10 mL
  Administered 2023-10-10: 30 mL
  Administered 2023-10-11 – 2023-10-13 (×4): 10 mL
  Administered 2023-10-13: 20 mL
  Administered 2023-10-14 – 2023-10-15 (×3): 10 mL

## 2023-10-10 MED ORDER — NOREPINEPHRINE 4 MG/250ML-% IV SOLN: 0.0000 ug/min | INTRAVENOUS | Status: DC

## 2023-10-10 MED ORDER — MILRINONE LACTATE IN DEXTROSE 20-5 MG/100ML-% IV SOLN
0.1250 ug/kg/min | INTRAVENOUS | Status: DC
  Administered 2023-10-10 – 2023-10-11 (×2): 0.125 ug/kg/min via INTRAVENOUS
  Filled 2023-10-10 (×2): qty 100

## 2023-10-10 NOTE — Progress Notes (Signed)
 Peripherally Inserted Central Catheter Placement  The IV Nurse has discussed with the patient and/or persons authorized to consent for the patient, the purpose of this procedure and the potential benefits and risks involved with this procedure.  The benefits include less needle sticks, lab draws from the catheter, and the patient may be discharged home with the catheter. Risks include, but not limited to, infection, bleeding, blood clot (thrombus formation), and puncture of an artery; nerve damage and irregular heartbeat and possibility to perform a PICC exchange if needed/ordered by physician.  Alternatives to this procedure were also discussed.  Bard Power PICC patient education guide, fact sheet on infection prevention and patient information card has been provided to patient /or left at bedside.    PICC Placement Documentation  PICC Triple Lumen 10/10/23 Right Basilic 41 cm 0 cm (Active)  Indication for Insertion or Continuance of Line Vasoactive infusions 10/10/23 1212  Exposed Catheter (cm) 0 cm 10/10/23 1212  Site Assessment Clean, Dry, Intact 10/10/23 1212  Lumen #1 Status Flushed;Saline locked;Blood return noted 10/10/23 1212  Lumen #2 Status Flushed;Saline locked;Blood return noted 10/10/23 1212  Lumen #3 Status Flushed;Saline locked;Blood return noted 10/10/23 1212  Dressing Type Transparent;Securing device 10/10/23 1212  Dressing Status Antimicrobial disc/dressing in place;Clean, Dry, Intact 10/10/23 1212  Line Care Connections checked and tightened 10/10/23 1212  Line Adjustment (NICU/IV Team Only) No 10/10/23 1212  Dressing Intervention New dressing;Adhesive placed at insertion site (IV team only);Adhesive placed around edges of dressing (IV team/ICU RN only) 10/10/23 1212  Dressing Change Due 10/17/23 10/10/23 1212       Justin Fleming 10/10/2023, 12:13 PM

## 2023-10-10 NOTE — Plan of Care (Signed)
  Problem: Education: Goal: Knowledge of General Education information will improve Description: Including pain rating scale, medication(s)/side effects and non-pharmacologic comfort measures Outcome: Progressing   Problem: Clinical Measurements: Goal: Diagnostic test results will improve Outcome: Progressing   Problem: Clinical Measurements: Goal: Cardiovascular complication will be avoided Outcome: Progressing   

## 2023-10-10 NOTE — Progress Notes (Signed)
   10/10/23 0734  Assess: MEWS Score  Temp 97.6 F (36.4 C)  BP (!) 87/65  MAP (mmHg) 73  Pulse Rate 79  ECG Heart Rate (!) 118  Resp 18  Level of Consciousness Alert  SpO2 98 %  O2 Device Room Air  Assess: MEWS Score  MEWS Temp 0  MEWS Systolic 1  MEWS Pulse 2  MEWS RR 0  MEWS LOC 0  MEWS Score 3  MEWS Score Color Yellow  Assess: if the MEWS score is Yellow or Red  Were vital signs accurate and taken at a resting state? Yes  Does the patient meet 2 or more of the SIRS criteria? No  MEWS guidelines implemented  No, previously yellow, continue vital signs every 4 hours  Provider Notification  Provider Name/Title Dr Perri  Date Provider Notified 10/10/23  Time Provider Notified [phone]  Method of Notification  (secure chat)  Notification Reason Other (Comment) (low BP)  Provider response Other (Comment) (will ask cardiology to see)  Date of Provider Response 10/10/23  Time of Provider Response 0751  Assess: SIRS CRITERIA  SIRS Temperature  0  SIRS Respirations  0  SIRS Pulse 1  SIRS WBC 0  SIRS Score Sum  1

## 2023-10-10 NOTE — Progress Notes (Signed)
 Transferred to ICU for pressor support.  Patient transferred to cardiology service, will sign off.  Please let us  know when we can be of further assistance.  [facility]  Meliton Monte, MD

## 2023-10-10 NOTE — Progress Notes (Signed)
 Sister [phone]  [phone]  Patient transferred to 2H18, patient  sister Ms Jenel called and informed.

## 2023-10-10 NOTE — Progress Notes (Addendum)
 Progress Note  Patient Name: Justin Fleming Date of Encounter: 10/10/2023  Primary Cardiologist: Jayson Sierras, MD  Subjective   No acute events except that his blood pressure has been soft on Lasix  drip.  Inpatient Medications    Scheduled Meds:  allopurinol  100 mg Oral Daily   apixaban   5 mg Oral BID   folic acid   1 mg Oral Daily   multivitamin with minerals  1 tablet Oral Daily   thiamine   100 mg Oral Daily   Or   thiamine   100 mg Intravenous Daily   Continuous Infusions:  sodium chloride      amiodarone  30 mg/hr (10/10/23 0933)   furosemide  (LASIX ) 200 mg in dextrose  5 % 100 mL (2 mg/mL) infusion 10 mg/hr (10/10/23 0933)   milrinone      norepinephrine (LEVOPHED) Adult infusion     PRN Meds: LORazepam  **OR** LORazepam    Vital Signs    Vitals:   10/10/23 0812 10/10/23 0910 10/10/23 0915 10/10/23 0930  BP: (!) 85/71 (!) 87/56 (!) 82/66 (!) 89/74  Pulse: (!) 121     Resp:      Temp:      TempSrc:      SpO2: 96%     Weight:      Height:        Intake/Output Summary (Last 24 hours) at 10/10/2023 0936 Last data filed at 10/10/2023 0933 Gross per 24 hour  Intake 1399.96 ml  Output 3450 ml  Net -2050.04 ml   Filed Weights   10/09/23 0706 10/10/23 0525  Weight: 96.4 kg 97 kg    Telemetry     Personally reviewed.  In A-fib with RVR, HR 120s  ECG    Not performed today.  Physical Exam   GEN: No acute distress.   Neck: JVD+ Cardiac: RRR, no murmur, rub, or gallop.  Respiratory: Nonlabored. Clear to auscultation bilaterally. GI: Soft, nontender, bowel sounds present. MS: 3-4+ edema; No deformity. Neuro:  Nonfocal. Psych: Alert and oriented x 3. Normal affect.  Labs    Chemistry Recent Labs  Lab 10/08/23 1205 10/09/23 0310 10/10/23 0301  NA 138 138 136  K 4.2 4.0 3.7  CL 101 100 97*  CO2 25 28 26   GLUCOSE 87 101* 105*  BUN 14 15 19   CREATININE 1.34* 1.37* 1.52*  CALCIUM 8.5* 8.7* 8.7*  PROT 6.7 6.4*  --   ALBUMIN  3.2* 3.1*  --    AST 18 10*  --   ALT 8 7  --   ALKPHOS 52 46  --   BILITOT 2.3* 1.9*  --   GFRNONAA 59* 57* 51*  ANIONGAP 12 10 13      Hematology Recent Labs  Lab 10/08/23 1450 10/09/23 0310 10/10/23 0301  WBC 5.3 4.8 5.6  RBC 3.37* 3.29* 3.55*  HGB 10.9* 10.6* 11.4*  HCT 34.1* 32.8* 35.1*  MCV 101.2* 99.7 98.9  MCH 32.3 32.2 32.1  MCHC 32.0 32.3 32.5  RDW 14.5 14.4 14.4  PLT 149* 145* 166    Cardiac Enzymes Recent Labs  Lab 10/08/23 1205 10/08/23 1549  TROPONINIHS 22* 22*    BNP Recent Labs  Lab 10/08/23 1205  BNP 522.8*     DDimerNo results for input(s): DDIMER in the last 168 hours.    Assessment & Plan   Acute systolic and diastolic heart failure - Presented with DOE, orthopnea, PND x 1 week and bilateral lower EXTR swelling with abdominal distention x 2 weeks.  Total bilirubin 2.3 on admission,  1.9 yesterday.  BNP 522.  Echo this admission showed LVEF less than 20%, severe RV systolic dysfunction, CVP 8 mmHg.  Prior  limited echo from February 2025 showed improvement in LVEF from 30% to 60 to 65% (after DCCV). - He made 3.7 L urine output in the last 24 hours with net -3 L on Lasix  drip 10 mg/h.  Blood pressures have been soft, 84-86 mm Hg SBP on Lasix  drip.  MAP is more than 70.  Will start him on low-dose Levophed.  Transfer him to ICU, 2H under cardiology service.  PICC line ordered.  Low threshold to start milrinone  depending on Co-Ox from PICC line. - GDMT limited due to soft blood pressures.   A-fib with RVR - EKG upon arrival to the ER showed A-fib with RVR.  Denies having any palpitations.  Drinks alcohol. - He had prior history of A-fib with RVR and underwent DCCV in Feb 2025 after which he stopped taking Eliquis . - Eliquis  5 mg twice daily is restarted this admission. - Continue amiodarone  drip. - Plans for TEE guided DCCV this admission after volume optimization.   Myocardial injury - Slightly elevated troponins likely secondary to volume overload and  A-fib with RVR, from demand ischemia.   Alcohol abuse - Counseling.  45 minutes spent in review the prior records, more than the labs, discussion of the above problems with the specialist teams, patient and documentation.  Signed, Diannah SHAUNNA Maywood, MD  10/10/2023, 9:36 AM

## 2023-10-10 NOTE — Plan of Care (Signed)
  Problem: Clinical Measurements: Goal: Diagnostic test results will improve Outcome: Progressing   Problem: Education: Goal: Knowledge of disease or condition will improve Outcome: Progressing

## 2023-10-10 NOTE — Progress Notes (Addendum)
 PROGRESS NOTE    Justin Fleming  FMW:[id number] DOB: Oct 30, 1960 DOA: 10/08/2023 PCP: Kayla Jeoffrey RAMAN, FNP  Chief Complaint  Patient presents with   Shortness of Breath   Leg Swelling    Brief Narrative:   Justin Fleming is Aladdin Kollmann 63 y.o. male with medical history significant of HFrecEF, atrial fibrillation, etoh abuse, GERD and multiple other medical issues here with leg and scrotal swelling.   Admitted for HF exacerbation and afib with RVR.  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)  Acute Heart Failure Exacerbation  HFrEF with Exacerbation Volume Overload  Dyspnea on Exertion CXR without acute cardiopulm disease Elevated BNP, significant LE edema to bilateral LE's extending proximally to thighs and includes his penis/scrotum He'd not been taking his chronic meds, only recently restarted PO lasix  Now on lasix  gtt per cardiology Repeat echo with EF < 20%, severely reduced RVSF, small pericardial effusion  Hold off on resuming SGLT2 with soft BP, scrotal swelling.  Holding losartan , metoprolol , spironolactone  with soft BP. Cardiology c/s, appreciate recommendations addendum - plan for ICU, picc, addition of levophed with hypotension - cards will take over. Strict I/O, daily weights   Atrial Fibrillation with RVR Didn't tolerate diltiazem  due to BP amiodarone  Resume eliquis   Consult cardiology (required DCCV in February) - planning for TEE cardioversion    Mildly Elevated Troponin Flat, not c/w ACS   CKD IIIa Appears consistent with prior  Watch with diuresis    Hypertension (now with hypotension) BP currently soft Diuresis per cardiology Holding BP meds   Gout Resume allopurinol   Etoh Abuse By his report, not drinking everyday, but still binge drinking CIWA   GERD PPI    Anemia, Macrocytic Labs with low iron, normal ferritin.  Normal B12 and folate.     He wasn't taking any of his meds.  He'd self  discontinued all his meds over the past several months.  Only taking lasix  in past few weeks after he developed LE edema.     DVT prophylaxis: eliquis  Code Status: full Family Communication: none Disposition:   Status is: Inpatient Remains inpatient appropriate because: need for continued inpatient care   Consultants:  cardiology  Procedures:  none  Antimicrobials:  Anti-infectives (From admission, onward)    None       Subjective: No new complaints  Objective: Vitals:   10/10/23 0734 10/10/23 0735 10/10/23 0742 10/10/23 0812  BP: (!) 87/65 (!) 84/59 (!) 86/75 (!) 85/71  Pulse: 79 79 (!) 110 (!) 121  Resp: 18     Temp: 97.6 F (36.4 C)     TempSrc: Oral     SpO2: 98% 98% 94% 96%  Weight:      Height:        Intake/Output Summary (Last 24 hours) at 10/10/2023 0840 Last data filed at 10/10/2023 0739 Gross per 24 hour  Intake 956 ml  Output 3450 ml  Net -2494 ml   Filed Weights   10/09/23 0706 10/10/23 0525  Weight: 96.4 kg 97 kg    Examination:  General: No acute distress. Cardiovascular: tachy, irregularly irregular Lungs: crackles at bases Abdomen: Soft, nontender, nondistended Neurological: Alert and oriented 3. Moves all extremities 4 with equal strength. Cranial nerves II through XII grossly intact. Skin: Warm and dry. No rashes or lesions. Extremities: continued edema to bilateral LE's extending proximally to hips  Data Reviewed: I have personally reviewed following labs and imaging studies  CBC: Recent  Labs  Lab 10/08/23 1450 10/09/23 0310 10/10/23 0301  WBC 5.3 4.8 5.6  HGB 10.9* 10.6* 11.4*  HCT 34.1* 32.8* 35.1*  MCV 101.2* 99.7 98.9  PLT 149* 145* 166    Basic Metabolic Panel: Recent Labs  Lab 10/08/23 1205 10/09/23 0310 10/10/23 0301  NA 138 138 136  K 4.2 4.0 3.7  CL 101 100 97*  CO2 25 28 26   GLUCOSE 87 101* 105*  BUN 14 15 19   CREATININE 1.34* 1.37* 1.52*  CALCIUM 8.5* 8.7* 8.7*    GFR: Estimated Creatinine  Clearance: 57.6 mL/min (Ajia Chadderdon) (by C-G formula based on SCr of 1.52 mg/dL (H)).  Liver Function Tests: Recent Labs  Lab 10/08/23 1205 10/09/23 0310  AST 18 10*  ALT 8 7  ALKPHOS 52 46  BILITOT 2.3* 1.9*  PROT 6.7 6.4*  ALBUMIN  3.2* 3.1*    CBG: No results for input(s): GLUCAP in the last 168 hours.   No results found for this or any previous visit (from the past 240 hours).       Radiology Studies: VAS US  LOWER EXTREMITY VENOUS (DVT) Result Date: 10/09/2023  Lower Venous DVT Study Patient Name:  Justin Fleming  Date of Exam:   10/09/2023 Medical Rec #: [id number]         Accession #:    [id number] Date of Birth: [birth year]         Patient Gender: M Patient Age:   24 years Exam Location:  [facility] Procedure:      VAS US  LOWER EXTREMITY VENOUS (DVT) Referring Phys: Juel Ripley POWELL JR --------------------------------------------------------------------------------  Indications: Edema.  Risk Factors: None identified. Limitations: Poor ultrasound/tissue interface. Comparison Study: No prior studies. Performing Technologist: Cordella Collet RVT  Examination Guidelines: Darnisha Vernet complete evaluation includes B-mode imaging, spectral Doppler, color Doppler, and power Doppler as needed of all accessible portions of each vessel. Bilateral testing is considered an integral part of Tracee Mccreery complete examination. Limited examinations for reoccurring indications may be performed as noted. The reflux portion of the exam is performed with the patient in reverse Trendelenburg.  +---------+---------------+---------+-----------+----------+--------------+ RIGHT    CompressibilityPhasicitySpontaneityPropertiesThrombus Aging +---------+---------------+---------+-----------+----------+--------------+ CFV      Full           Yes      No                                  +---------+---------------+---------+-----------+----------+--------------+ SFJ      Full                                                         +---------+---------------+---------+-----------+----------+--------------+ FV Prox  Full                                                        +---------+---------------+---------+-----------+----------+--------------+ FV Mid   Full                                                        +---------+---------------+---------+-----------+----------+--------------+  FV DistalFull                                                        +---------+---------------+---------+-----------+----------+--------------+ PFV      Full                                                        +---------+---------------+---------+-----------+----------+--------------+ POP      Full           Yes      No                                  +---------+---------------+---------+-----------+----------+--------------+ PTV      Full                                                        +---------+---------------+---------+-----------+----------+--------------+ PERO     Full                                                        +---------+---------------+---------+-----------+----------+--------------+   +---------+---------------+---------+-----------+----------+--------------+ LEFT     CompressibilityPhasicitySpontaneityPropertiesThrombus Aging +---------+---------------+---------+-----------+----------+--------------+ CFV      Full           Yes      No                                  +---------+---------------+---------+-----------+----------+--------------+ SFJ      Full                                                        +---------+---------------+---------+-----------+----------+--------------+ FV Prox  Full                                                        +---------+---------------+---------+-----------+----------+--------------+ FV Mid   Full                                                         +---------+---------------+---------+-----------+----------+--------------+ FV DistalFull                                                        +---------+---------------+---------+-----------+----------+--------------+  PFV      Full                                                        +---------+---------------+---------+-----------+----------+--------------+ POP      Full           Yes      No                                  +---------+---------------+---------+-----------+----------+--------------+ PTV      Full                                                        +---------+---------------+---------+-----------+----------+--------------+ PERO     Full                                                        +---------+---------------+---------+-----------+----------+--------------+    Summary: RIGHT: - There is no evidence of deep vein thrombosis in the lower extremity.  - No cystic structure found in the popliteal fossa.  LEFT: - There is no evidence of deep vein thrombosis in the lower extremity.  - No cystic structure found in the popliteal fossa.  *See table(s) above for measurements and observations.    Preliminary    ECHOCARDIOGRAM COMPLETE Result Date: 10/09/2023    ECHOCARDIOGRAM REPORT   Patient Name:   TREVIS EDEN Date of Exam: 10/09/2023 Medical Rec #:  [id number]        Height:       71.0 in Accession #:    [id number]       Weight:       212.1 lb Date of Birth:  [birth year]        BSA:          2.162 m Patient Age:    63 years         BP:           95/72 mmHg Patient Gender: M                HR:           124 bpm. Exam Location:  Inpatient Procedure: 2D Echo, Limited Color Doppler, Color Doppler and Intracardiac            Opacification Agent (Both Spectral and Color Flow Doppler were            utilized during procedure). Indications:    Congestive Heart Failure  History:        Patient has prior history of Echocardiogram examinations, most                  recent 02/21/2023. Arrythmias:Atrial Fibrillation; Risk                 Factors:Hypertension and Former Smoker. GERD.  Sonographer:    Logan Shove RDCS Referring Phys: [phone] Wendall Isabell CALDWELL POWELL JR IMPRESSIONS  1. Left ventricular ejection fraction, by  estimation, is <20%. The left ventricle has severely decreased function. The left ventricle demonstrates global hypokinesis. There is severe concentric left ventricular hypertrophy. Left ventricular diastolic parameters are indeterminate.  2. Right ventricular systolic function is severely reduced. The right ventricular size is mildly enlarged.  3. Left atrial size was severely dilated.  4. Right atrial size was mild to moderately dilated.  5. Tiannah Greenly small pericardial effusion is present. The pericardial effusion is circumferential.  6. The mitral valve is grossly normal. Mild mitral valve regurgitation. No evidence of mitral stenosis.  7. The aortic valve was not well visualized. Aortic valve regurgitation is trivial. No aortic stenosis is present.  8. The inferior vena cava is normal in size with <50% respiratory variability, suggesting right atrial pressure of 8 mmHg. Comparison(s): Changes from prior study are noted. Severe biventricular failure; LVEF worsened from 60% to <20% now. FINDINGS  Left Ventricle: Left ventricular ejection fraction, by estimation, is <20%. The left ventricle has severely decreased function. The left ventricle demonstrates global hypokinesis. Definity  contrast agent was given IV to delineate the left ventricular endocardial borders. Strain was performed and the global longitudinal strain is indeterminate. The left ventricular internal cavity size was normal in size. There is severe concentric left ventricular hypertrophy. Left ventricular diastolic parameters are indeterminate. Right Ventricle: The right ventricular size is mildly enlarged. No increase in right ventricular wall thickness. Right ventricular systolic function is severely reduced.  Left Atrium: Left atrial size was severely dilated. Right Atrium: Right atrial size was mild to moderately dilated. Pericardium: Aara Jacquot small pericardial effusion is present. The pericardial effusion is circumferential. Mitral Valve: The mitral valve is grossly normal. Mild mitral valve regurgitation. No evidence of mitral valve stenosis. Tricuspid Valve: The tricuspid valve is grossly normal. Tricuspid valve regurgitation is trivial. No evidence of tricuspid stenosis. Aortic Valve: The aortic valve was not well visualized. Aortic valve regurgitation is trivial. No aortic stenosis is present. Aortic valve peak gradient measures 2.9 mmHg. Pulmonic Valve: The pulmonic valve was not well visualized. Pulmonic valve regurgitation is trivial. No evidence of pulmonic stenosis. Aorta: The aortic root is normal in size and structure. Venous: The inferior vena cava is normal in size with less than 50% respiratory variability, suggesting right atrial pressure of 8 mmHg. IAS/Shunts: No atrial level shunt detected by color flow Doppler. Additional Comments: 3D was performed not requiring image post processing on an independent workstation and was indeterminate.  LEFT VENTRICLE PLAX 2D LVIDd:         5.10 cm LVIDs:         4.40 cm LV PW:         1.60 cm LV IVS:        1.60 cm LVOT diam:     2.10 cm LVOT Area:     3.46 cm  LV Volumes (MOD) LV vol d, MOD A2C: 205.0 ml LV vol d, MOD A4C: 124.0 ml LV vol s, MOD A2C: 141.0 ml LV vol s, MOD A4C: 88.0 ml LV SV MOD A2C:     64.0 ml LV SV MOD A4C:     124.0 ml LV SV MOD BP:      46.9 ml RIGHT VENTRICLE            IVC RV Basal diam:  4.30 cm    IVC diam: 1.70 cm RV Mid diam:    2.90 cm RV S prime:     5.80 cm/s TAPSE (M-mode): 1.3 cm LEFT ATRIUM  Index        RIGHT ATRIUM           Index LA diam:        3.40 cm  1.57 cm/m   RA Area:     23.80 cm LA Vol (A2C):   119.0 ml 55.05 ml/m  RA Volume:   76.00 ml  35.16 ml/m LA Vol (A4C):   75.1 ml  34.74 ml/m LA Biplane Vol: 101.0 ml  46.72 ml/m  AORTIC VALVE AV Area (Vmax): 2.47 cm AV Vmax:        84.72 cm/s AV Peak Grad:   2.9 mmHg LVOT Vmax:      60.40 cm/s  AORTA Ao Root diam: 3.30 cm TRICUSPID VALVE TV Peak grad:   23.2 mmHg TV Vmax:        2.41 m/s  SHUNTS Systemic Diam: 2.10 cm Vishnu Priya Mallipeddi Electronically signed by Diannah Late Mallipeddi Signature Date/Time: 10/09/2023/12:49:12 PM    Final    DG Chest Portable 1 View Result Date: 10/08/2023 CLINICAL DATA:  Shortness of breath and lower extremity swelling. EXAM: PORTABLE CHEST 1 VIEW COMPARISON:  02/16/2023 FINDINGS: Patient is rotated to the right. Lungs are hypoinflated without acute airspace consolidation or effusion. No significant interstitial edema. Mild stable cardiomegaly. Remainder of the exam is unchanged. IMPRESSION: 1. Hypoinflation without acute cardiopulmonary disease. 2. Mild stable cardiomegaly. Electronically Signed   By: Toribio Agreste M.D.   On: 10/08/2023 12:20        Scheduled Meds:  allopurinol  100 mg Oral Daily   apixaban   5 mg Oral BID   folic acid   1 mg Oral Daily   multivitamin with minerals  1 tablet Oral Daily   thiamine   100 mg Oral Daily   Or   thiamine   100 mg Intravenous Daily   Continuous Infusions:  sodium chloride      amiodarone  30 mg/hr (10/09/23 2008)   furosemide  (LASIX ) 200 mg in dextrose  5 % 100 mL (2 mg/mL) infusion 10 mg/hr (10/09/23 1741)   milrinone      norepinephrine (LEVOPHED) Adult infusion       LOS: 2 days    Time spent: over 76m in     Meliton Monte, MD [facility]   To contact the attending provider between 7A-7P or the covering provider during after hours 7P-7A, please log into the web site [email or web address] and access using universal [place] password for that web site. If you do not have the password, please call the hospital operator.  10/10/2023, 8:40 AM
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
## 2023-10-11 ENCOUNTER — Encounter (HOSPITAL_COMMUNITY): Payer: Self-pay | Admitting: Family Medicine

## 2023-10-11 DIAGNOSIS — I5041 Acute combined systolic (congestive) and diastolic (congestive) heart failure: Secondary | ICD-10-CM | POA: Diagnosis not present

## 2023-10-11 LAB — BASIC METABOLIC PANEL WITH GFR
Anion gap: 14 (ref 5–15)
BUN: 20 mg/dL (ref 8–23)
CO2: 30 mmol/L (ref 22–32)
Calcium: 8.5 mg/dL — ABNORMAL LOW (ref 8.9–10.3)
Chloride: 90 mmol/L — ABNORMAL LOW (ref 98–111)
Creatinine, Ser: 1.43 mg/dL — ABNORMAL HIGH (ref 0.61–1.24)
GFR, Estimated: 54 mL/min — ABNORMAL LOW (ref >60)
Glucose, Bld: 150 mg/dL — ABNORMAL HIGH (ref 70–99)
Potassium: 3.5 mmol/L (ref 3.5–5.1)
Sodium: 134 mmol/L — ABNORMAL LOW (ref 135–145)

## 2023-10-11 LAB — COOXEMETRY PANEL
Carboxyhemoglobin: 2.4 % — ABNORMAL HIGH (ref 0.5–1.5)
Methemoglobin: <0.7 % (ref 0.0–1.5)
O2 Saturation: 56.8 %
Total hemoglobin: 10.6 g/dL — ABNORMAL LOW (ref 12.0–16.0)

## 2023-10-11 LAB — CBC
HCT: 32.9 % — ABNORMAL LOW (ref 39.0–52.0)
Hemoglobin: 10.5 g/dL — ABNORMAL LOW (ref 13.0–17.0)
MCH: 31.8 pg (ref 26.0–34.0)
MCHC: 31.9 g/dL (ref 30.0–36.0)
MCV: 99.7 fL (ref 80.0–100.0)
Platelets: 162 K/uL (ref 150–400)
RBC: 3.3 MIL/uL — ABNORMAL LOW (ref 4.22–5.81)
RDW: 14.3 % (ref 11.5–15.5)
WBC: 5.7 K/uL (ref 4.0–10.5)
nRBC: 0 % (ref 0.0–0.2)

## 2023-10-11 LAB — MAGNESIUM: Magnesium: 1.7 mg/dL (ref 1.7–2.4)

## 2023-10-11 MED ORDER — FUROSEMIDE 10 MG/ML IJ SOLN
80.0000 mg | Freq: Once | INTRAMUSCULAR | Status: AC
  Administered 2023-10-11: 80 mg via INTRAVENOUS
  Filled 2023-10-11: qty 8

## 2023-10-11 MED ORDER — MAGNESIUM SULFATE 4 GM/100ML IV SOLN
4.0000 g | Freq: Once | INTRAVENOUS | Status: AC
  Administered 2023-10-11: 4 g via INTRAVENOUS
  Filled 2023-10-11: qty 100

## 2023-10-11 MED ORDER — POTASSIUM CHLORIDE CRYS ER 20 MEQ PO TBCR
40.0000 meq | EXTENDED_RELEASE_TABLET | ORAL | Status: AC
  Administered 2023-10-11 (×2): 40 meq via ORAL
  Filled 2023-10-11 (×2): qty 2

## 2023-10-11 NOTE — Progress Notes (Signed)
 Heart Failure Navigator Progress Note  Assessed for Heart & Vascular TOC clinic readiness.  Patient does not meet criteria due to Advanced Heart Failure Team patient of Dr. Elwyn Lade. .   Navigator will sign off at this time.    Rhae Hammock, BSN, Scientist, clinical (histocompatibility and immunogenetics) Only

## 2023-10-11 NOTE — Anesthesia Preprocedure Evaluation (Signed)
 Anesthesia Evaluation    Reviewed: Allergy & Precautions, H&P , Patient's Chart, lab work & pertinent test results, Unable to perform ROS - Chart review only  Airway Mallampati: II   Neck ROM: full    Dental   Pulmonary former smoker Pt has URI with coronavirus.   breath sounds clear to auscultation       Cardiovascular hypertension, Pt. on home beta blockers +CHF  + dysrhythmias Atrial Fibrillation  Rhythm:irregular Rate:Normal  Echo 10/2023  1. Left ventricular ejection fraction, by estimation, is <20%. The left  ventricle has severely decreased function. The left ventricle demonstrates  global hypokinesis. There is severe concentric left ventricular  hypertrophy. Left ventricular diastolic parameters are indeterminate.   2. Right ventricular systolic function is severely reduced. The right  ventricular size is mildly enlarged.   3. Left atrial size was severely dilated.   4. Right atrial size was mild to moderately dilated.   5. A small pericardial effusion is present. The pericardial effusion is  circumferential.   6. The mitral valve is grossly normal. Mild mitral valve regurgitation.  No evidence of mitral stenosis.   7. The aortic valve was not well visualized. Aortic valve regurgitation  is trivial. No aortic stenosis is present.   8. The inferior vena cava is normal in size with <50% respiratory  variability, suggesting right atrial pressure of 8 mmHg.   Comparison(s): Changes from prior study are noted. Severe biventricular  failure; LVEF worsened from 60% to <20% now.     Echo 02/21/2023  1. Left ventricular ejection fraction, by estimation, is 60 to 65%. The  left ventricle has normal function. There is severe left ventricular  hypertrophy.   2. Right ventricular systolic function is mildly reduced. The right  ventricular size is normal.   3. The inferior vena cava is normal in size with <50% respiratory   variability, suggesting right atrial pressure of 8 mmHg.   Comparison(s): Prior images reviewed side by side. Changes from prior  study are noted. LVEF has improved. RV function may have improved by one  grade. RA pressure estimate matches clinically obtained CVP today.    Echo 02/11/2023  1. Left ventricular ejection fraction, by estimation, is 30 to 35%. The  left ventricle has moderately decreased function.   2. No left atrial/left atrial appendage thrombus was detected.   3. Mild mitral valve regurgitation.   4. Tricuspid valve regurgitation is mild to moderate.   5. Aortic valve regurgitation is trivial.   6. There is a moderately sized patent foramen ovale with predominantly  right to left shunting across the atrial septum.   Conclusion(s)/Recommendation(s): No LA/LAA thrombus identified. Negative  bubble study for interatrial shunt. No intracardiac source of embolism  detected on this on this transesophageal echocardiogram.      Neuro/Psych  Headaches PSYCHIATRIC DISORDERS Anxiety Depression       GI/Hepatic ,GERD  Medicated,,  Endo/Other    Renal/GU ARFRenal diseaseCr 1.47     Musculoskeletal   Abdominal   Peds  Hematology  (+) Blood dyscrasia, anemia Hemoglobin 9.1   Anesthesia Other Findings   Reproductive/Obstetrics                              Anesthesia Physical Anesthesia Plan  ASA: 4  Anesthesia Plan: MAC   Post-op Pain Management:    Induction: Intravenous  PONV Risk Score and Plan: 1 and Treatment may vary due to age or  medical condition and TIVA  Airway Management Planned: Nasal Cannula  Additional Equipment:   Intra-op Plan:   Post-operative Plan:   Informed Consent:   Plan Discussed with:   Anesthesia Plan Comments:         Anesthesia Quick Evaluation

## 2023-10-11 NOTE — Plan of Care (Signed)
   Problem: Clinical Measurements: Goal: Will remain free from infection Outcome: Progressing Goal: Diagnostic test results will improve Outcome: Progressing Goal: Respiratory complications will improve Outcome: Progressing   Problem: Activity: Goal: Risk for activity intolerance will decrease Outcome: Progressing   Problem: Nutrition: Goal: Adequate nutrition will be maintained Outcome: Progressing   Problem: Coping: Goal: Level of anxiety will decrease Outcome: Progressing

## 2023-10-11 NOTE — Progress Notes (Addendum)
 Advanced Heart Failure Rounding Note  Cardiologist: Jayson Sierras, MD  Chief Complaint: A/C HFrEF Subjective:    Denies SOB.    Objective:   Weight Range: 96.5 kg Body mass index is 29.67 kg/m.   Vital Signs:   Temp:  [97.6 F (36.4 C)-98.7 F (37.1 C)] 98.7 F (37.1 C) (10/05 2300) Pulse Rate:  [79-133] 107 (10/06 0630) Resp:  [10-27] 19 (10/06 0630) BP: (80-114)/(56-90) 85/68 (10/06 0630) SpO2:  [79 %-99 %] 95 % (10/06 0630) Weight:  [96.5 kg] 96.5 kg (10/06 0500) Last BM Date : 10/10/23  Weight change: Filed Weights   10/09/23 0706 10/10/23 0525 10/11/23 0500  Weight: 96.4 kg 97 kg 96.5 kg    Intake/Output:   Intake/Output Summary (Last 24 hours) at 10/11/2023 0728 Last data filed at 10/11/2023 0709 Gross per 24 hour  Intake 1677.67 ml  Output 6450 ml  Net -4772.33 ml     CVP 8  Physical Exam   General:   No resp difficulty Neck: no JVD.  Cor: Tachy Irregular rate & rhythm.  Lungs: clear Abdomen: soft, nontender, nondistended.  Extremities: no  edema Neuro: alert & oriented x3  Telemetry    A fib RVR + NSVT   EKG   N/A    Labs    CBC Recent Labs    10/10/23 0301 10/11/23 0414  WBC 5.6 5.7  HGB 11.4* 10.5*  HCT 35.1* 32.9*  MCV 98.9 99.7  PLT 166 162   Basic Metabolic Panel Recent Labs    89/94/74 0301 10/11/23 0414  NA 136 134*  K 3.7 3.5  CL 97* 90*  CO2 26 30  GLUCOSE 105* 150*  BUN 19 20  CREATININE 1.52* 1.43*  CALCIUM 8.7* 8.5*  MG  --  1.7   Liver Function Tests Recent Labs    10/08/23 1205 10/09/23 0310  AST 18 10*  ALT 8 7  ALKPHOS 52 46  BILITOT 2.3* 1.9*  PROT 6.7 6.4*  ALBUMIN  3.2* 3.1*   No results for input(s): LIPASE, AMYLASE in the last 72 hours. Cardiac Enzymes No results for input(s): CKTOTAL, CKMB, CKMBINDEX, TROPONINI in the last 72 hours.  BNP: BNP (last 3 results) Recent Labs    03/29/23 0922 04/27/23 0925 10/08/23 1205  BNP 291.2* 489.5* 522.8*    ProBNP (last  3 results) No results for input(s): PROBNP in the last 8760 hours.   D-Dimer No results for input(s): DDIMER in the last 72 hours. Hemoglobin A1C No results for input(s): HGBA1C in the last 72 hours. Fasting Lipid Panel No results for input(s): CHOL, HDL, LDLCALC, TRIG, CHOLHDL, LDLDIRECT in the last 72 hours. Thyroid Function Tests Recent Labs    10/09/23 0310  TSH 2.292    Other results:   Imaging    DG CHEST PORT 1 VIEW Result Date: 10/10/2023 CLINICAL DATA:  PICC line placement. EXAM: PORTABLE CHEST 1 VIEW COMPARISON:  10/08/2023 FINDINGS: Rightward patient rotation. Markedly low lung volumes with asymmetric elevation left hemidiaphragm, similar to prior. The cardio pericardial silhouette is enlarged. There is pulmonary vascular congestion without overt pulmonary edema. Right PICC line is new in the interval with tip overlying the expected region of the distal SVC. Telemetry leads overlie the chest. IMPRESSION: 1. New right PICC line tip overlies the expected region of the distal SVC. 2. Low volume film with vascular congestion. Electronically Signed   By: Camellia Candle M.D.   On: 10/10/2023 13:03   US  EKG SITE RITE Result Date: 10/10/2023  If [employer] not attached, placement could not be confirmed due to current cardiac rhythm.  US  EKG SITE RITE Result Date: 10/10/2023 If Site Rite image not attached, placement could not be confirmed due to current cardiac rhythm.    Medications:     Scheduled Medications:  allopurinol  100 mg Oral Daily   apixaban   5 mg Oral BID   folic acid   1 mg Oral Daily   multivitamin with minerals  1 tablet Oral Daily   sodium chloride  flush  10-40 mL Intracatheter Q12H   thiamine   100 mg Oral Daily   Or   thiamine   100 mg Intravenous Daily    Infusions:  sodium chloride      amiodarone  30 mg/hr (10/11/23 0701)   furosemide  (LASIX ) 200 mg in dextrose  5 % 100 mL (2 mg/mL) infusion 20 mg/hr (10/11/23 9347)    milrinone  0.125 mcg/kg/min (10/11/23 9347)   norepinephrine (LEVOPHED) Adult infusion Stopped (10/10/23 1200)    PRN Medications: LORazepam  **OR** LORazepam , mouth rinse, sodium chloride  flush    Patient Profile   63 y.o. male with history of alcohol abuse, tobacco use, atrial fibrillation, anxiety/depression, HTN, GERD.   A/C HFrEF---> A fib RVR  Assessment/Plan  Acute on Chronic HFrEF in setting of AF with RVR - EF preserved on echo 12/24 - Echo 02/24: EF 30-35%, moderately reduced RV, moderate BAE - RHC 2/25 off milrinone : normal to mildly elevated filling pressures and preserved Fick and TD  Suspect tachymediated cardiomyoapthy in setting of atrial fibrillation with RVR, ETOH abuse, +/- viral illness (had Coronavirus OC43).  - Presumed NICM has not had cath (likely does not need at this point) - hstrop 22, 22 - Echo 02/21/23 (with maintenance of SR): EF 60-65%, RV mildly reduced - Now with recurrent HF in setting of recurrent AF and med noncompliance - Echo LVEF < 20% severe RV dysfunction.  CVP down to 8.. Stop IV lasix .   CO-OX stable on milrinone  0.125. Consider stopping later today.    2. Paroxysmal atrial fibrillation - Diagnosed 12/24 - S/p DCCV to SR 2/25 - Now with recurrent AF of unknown duration . Remains in A fib RVR. - Has been noncompliant with amio and Eliquis  - Continue Eliquis  for now (may need to switch to heparin  if unstable) -  TEE/DC-CV tomorrow . Volume status much better.    3. ETOH abuse - Back to drinking, discussed cessation  - Cessation imperative.  - CIWA protocol    4. Snoring - Needs sleep study  OOB.    Plan TEE [facility] tomorrow Informed Consent   Shared Decision Making/Informed Consent   The risks [stroke, cardiac arrhythmias rarely resulting in the need for a temporary or permanent pacemaker, skin irritation or burns, esophageal damage, perforation (1:10,000 risk), bleeding, pharyngeal hematoma as well as other potential complications  associated with conscious sedation including aspiration, arrhythmia, respiratory failure and death], benefits (treatment guidance, restoration of normal sinus rhythm, diagnostic support) and alternatives of a transesophageal echocardiogram guided cardioversion were discussed in detail with Mr. Mancino and he is willing to proceed.     Length of Stay: 3  Amante Fomby, NP  10/11/2023, 7:28 AM  Advanced Heart Failure Team Pager [phone] (M-F; 7a - 5p)  Please contact [facility] for night-coverage after hours (5p -7a ) and weekends on [email or web address]

## 2023-10-12 ENCOUNTER — Inpatient Hospital Stay (HOSPITAL_COMMUNITY)

## 2023-10-12 ENCOUNTER — Inpatient Hospital Stay (HOSPITAL_COMMUNITY): Admitting: Anesthesiology

## 2023-10-12 ENCOUNTER — Encounter (HOSPITAL_COMMUNITY): Admission: EM | Disposition: A | Payer: Self-pay | Source: Home / Self Care | Attending: Family Medicine

## 2023-10-12 ENCOUNTER — Encounter (HOSPITAL_COMMUNITY): Payer: Self-pay | Admitting: Cardiology

## 2023-10-12 DIAGNOSIS — I5033 Acute on chronic diastolic (congestive) heart failure: Secondary | ICD-10-CM

## 2023-10-12 DIAGNOSIS — I130 Hypertensive heart and chronic kidney disease with heart failure and stage 1 through stage 4 chronic kidney disease, or unspecified chronic kidney disease: Secondary | ICD-10-CM

## 2023-10-12 DIAGNOSIS — I480 Paroxysmal atrial fibrillation: Secondary | ICD-10-CM

## 2023-10-12 DIAGNOSIS — N1831 Chronic kidney disease, stage 3a: Secondary | ICD-10-CM | POA: Diagnosis not present

## 2023-10-12 DIAGNOSIS — I5023 Acute on chronic systolic (congestive) heart failure: Secondary | ICD-10-CM | POA: Diagnosis not present

## 2023-10-12 DIAGNOSIS — I4891 Unspecified atrial fibrillation: Secondary | ICD-10-CM

## 2023-10-12 HISTORY — PX: TRANSESOPHAGEAL ECHOCARDIOGRAM (CATH LAB): EP1270

## 2023-10-12 HISTORY — PX: CARDIOVERSION: EP1203

## 2023-10-12 LAB — BASIC METABOLIC PANEL WITH GFR
Anion gap: 11 (ref 5–15)
Anion gap: 12 (ref 5–15)
BUN: 21 mg/dL (ref 8–23)
BUN: 21 mg/dL (ref 8–23)
CO2: 30 mmol/L (ref 22–32)
CO2: 30 mmol/L (ref 22–32)
Calcium: 8.6 mg/dL — ABNORMAL LOW (ref 8.9–10.3)
Calcium: 8.3 mg/dL — ABNORMAL LOW (ref 8.9–10.3)
Chloride: 92 mmol/L — ABNORMAL LOW (ref 98–111)
Chloride: 93 mmol/L — ABNORMAL LOW (ref 98–111)
Creatinine, Ser: 1.6 mg/dL — ABNORMAL HIGH (ref 0.61–1.24)
Creatinine, Ser: 1.61 mg/dL — ABNORMAL HIGH (ref 0.61–1.24)
GFR, Estimated: 48 mL/min — ABNORMAL LOW (ref >60)
GFR, Estimated: 47 mL/min — ABNORMAL LOW (ref >60)
Glucose, Bld: 158 mg/dL — ABNORMAL HIGH (ref 70–99)
Glucose, Bld: 165 mg/dL — ABNORMAL HIGH (ref 70–99)
Potassium: 4.4 mmol/L (ref 3.5–5.1)
Potassium: 3.7 mmol/L (ref 3.5–5.1)
Sodium: 133 mmol/L — ABNORMAL LOW (ref 135–145)
Sodium: 135 mmol/L (ref 135–145)

## 2023-10-12 LAB — CBC
HCT: 34.8 % — ABNORMAL LOW (ref 39.0–52.0)
Hemoglobin: 11.1 g/dL — ABNORMAL LOW (ref 13.0–17.0)
MCH: 31.6 pg (ref 26.0–34.0)
MCHC: 31.9 g/dL (ref 30.0–36.0)
MCV: 99.1 fL (ref 80.0–100.0)
Platelets: 186 K/uL (ref 150–400)
RBC: 3.51 MIL/uL — ABNORMAL LOW (ref 4.22–5.81)
RDW: 14.5 % (ref 11.5–15.5)
WBC: 7.5 K/uL (ref 4.0–10.5)
nRBC: 0 % (ref 0.0–0.2)

## 2023-10-12 LAB — COOXEMETRY PANEL
Carboxyhemoglobin: 2.2 % — ABNORMAL HIGH (ref 0.5–1.5)
Carboxyhemoglobin: 2.9 % — ABNORMAL HIGH (ref 0.5–1.5)
Methemoglobin: <0.7 % (ref 0.0–1.5)
Methemoglobin: <0.7 % (ref 0.0–1.5)
O2 Saturation: 51.1 %
O2 Saturation: 62.2 %
Total hemoglobin: 11.6 g/dL — ABNORMAL LOW (ref 12.0–16.0)
Total hemoglobin: 11.2 g/dL — ABNORMAL LOW (ref 12.0–16.0)

## 2023-10-12 LAB — MAGNESIUM: Magnesium: 2.3 mg/dL (ref 1.7–2.4)

## 2023-10-12 SURGERY — TRANSESOPHAGEAL ECHOCARDIOGRAM (TEE) (CATHLAB)
Anesthesia: Monitor Anesthesia Care

## 2023-10-12 MED ORDER — SODIUM CHLORIDE 0.9 % IV SOLN: INTRAVENOUS | Status: DC

## 2023-10-12 MED ORDER — BUTAMBEN-TETRACAINE-BENZOCAINE 2-2-14 % EX AERO
INHALATION_SPRAY | CUTANEOUS | Status: DC | PRN
  Administered 2023-10-12: 1 via TOPICAL

## 2023-10-12 MED ORDER — FUROSEMIDE 10 MG/ML IJ SOLN: 80.0000 mg | Freq: Two times a day (BID) | INTRAMUSCULAR | Status: DC

## 2023-10-12 MED ORDER — ETOMIDATE 2 MG/ML IV SOLN
INTRAVENOUS | Status: DC | PRN
Start: 2023-10-12 — End: 2023-10-12
  Administered 2023-10-12: 6 mg via INTRAVENOUS
  Administered 2023-10-12 (×2): 2 mg via INTRAVENOUS

## 2023-10-12 MED ORDER — FUROSEMIDE 10 MG/ML IJ SOLN
80.0000 mg | Freq: Once | INTRAMUSCULAR | Status: AC
  Administered 2023-10-12: 80 mg via INTRAVENOUS
  Filled 2023-10-12: qty 8

## 2023-10-12 SURGICAL SUPPLY — 1 items: PAD DEFIB RADIO PHYSIO CONN (PAD) ×1 IMPLANT

## 2023-10-12 NOTE — Transfer of Care (Signed)
 Immediate Anesthesia Transfer of Care Note  Patient: Justin Fleming  Procedure(s) Performed: TRANSESOPHAGEAL ECHOCARDIOGRAM CARDIOVERSION  Patient Location: PACU  Anesthesia Type:MAC  Level of Consciousness: sedated  Airway & Oxygen Therapy: Patient Spontanous Breathing  Post-op Assessment: Report given to RN  Post vital signs: stable  Last Vitals:  Vitals Value Taken Time  BP 102/75 10/12/23 13:17  Temp 36.4 C 10/12/23 13:17  Pulse 88 10/12/23 13:18  Resp 18 10/12/23 13:18  SpO2 95 % 10/12/23 13:18  Vitals shown include unfiled device data.  Last Pain:  Vitals:   10/12/23 1317  TempSrc: Tympanic  PainSc: Asleep      Patients Stated Pain Goal: 0 (10/11/23 0018)  Complications: No notable events documented.

## 2023-10-12 NOTE — Progress Notes (Cosign Needed Addendum)
 Advanced Heart Failure Rounding Note  Cardiologist: Jayson Sierras, MD  Chief Complaint: A/C HFrEF Subjective:   10/6 Milrinone  stopped. Continued on amio drip. IV lasix  stopped   Denies SOB.  Objective:   Weight Range: 89.6 kg Body mass index is 27.55 kg/m.   Vital Signs:   Temp:  [98.1 F (36.7 C)-99.3 F (37.4 C)] 99.3 F (37.4 C) (10/07 0724) Pulse Rate:  [94-250] 115 (10/07 1000) Resp:  [13-27] 20 (10/07 1000) BP: (75-128)/(57-102) 86/75 (10/07 1000) SpO2:  [79 %-98 %] 92 % (10/07 1000) Weight:  [89.6 kg] 89.6 kg (10/07 0500) Last BM Date : 10/07/23  Weight change: Filed Weights   10/10/23 0525 10/11/23 0500 10/12/23 0500  Weight: 97 kg 96.5 kg 89.6 kg    Intake/Output:   Intake/Output Summary (Last 24 hours) at 10/12/2023 1016 Last data filed at 10/12/2023 0700 Gross per 24 hour  Intake 1420.08 ml  Output 1350 ml  Net 70.08 ml    CVP 14-15   Physical Exam   General:   No resp difficulty Neck: JVP 11-12  Cor: Tachy Irregular rate & rhythm.  Lungs: clear Abdomen: soft, nontender, nondistended.  Extremities: no  edema Neuro: alert & oriented x3  Telemetry   A fib RVR  110-130s   EKG   N/A    Labs    CBC Recent Labs    10/11/23 0414 10/12/23 0543  WBC 5.7 7.5  HGB 10.5* 11.1*  HCT 32.9* 34.8*  MCV 99.7 99.1  PLT 162 186   Basic Metabolic Panel Recent Labs    89/93/74 0414 10/12/23 0543  NA 134* 133*  K 3.5 4.4  CL 90* 92*  CO2 30 30  GLUCOSE 150* 158*  BUN 20 21  CREATININE 1.43* 1.60*  CALCIUM 8.5* 8.6*  MG 1.7 2.3   Liver Function Tests No results for input(s): AST, ALT, ALKPHOS, BILITOT, PROT, ALBUMIN  in the last 72 hours.  No results for input(s): LIPASE, AMYLASE in the last 72 hours. Cardiac Enzymes No results for input(s): CKTOTAL, CKMB, CKMBINDEX, TROPONINI in the last 72 hours.  BNP: BNP (last 3 results) Recent Labs    03/29/23 0922 04/27/23 0925 10/08/23 1205  BNP 291.2*  489.5* 522.8*    ProBNP (last 3 results) No results for input(s): PROBNP in the last 8760 hours.   D-Dimer No results for input(s): DDIMER in the last 72 hours. Hemoglobin A1C No results for input(s): HGBA1C in the last 72 hours. Fasting Lipid Panel No results for input(s): CHOL, HDL, LDLCALC, TRIG, CHOLHDL, LDLDIRECT in the last 72 hours. Thyroid Function Tests No results for input(s): TSH, T4TOTAL, T3FREE, THYROIDAB in the last 72 hours.  Invalid input(s): FREET3   Other results:   Imaging    No results found.    Medications:     Scheduled Medications:  allopurinol  100 mg Oral Daily   apixaban   5 mg Oral BID   folic acid   1 mg Oral Daily   multivitamin with minerals  1 tablet Oral Daily   sodium chloride  flush  10-40 mL Intracatheter Q12H   thiamine   100 mg Oral Daily   Or   thiamine   100 mg Intravenous Daily    Infusions:  sodium chloride      amiodarone  60 mg/hr (10/12/23 0757)    PRN Medications: mouth rinse, sodium chloride  flush    Patient Profile   63 y.o. male with history of alcohol abuse, tobacco use, atrial fibrillation, anxiety/depression, HTN, GERD.   A/C HFrEF--->  A fib RVR  Assessment/Plan  Acute on Chronic HFrEF in setting of AF with RVR - EF preserved on echo 12/24 - Echo 02/24: EF 30-35%, moderately reduced RV, moderate BAE - RHC 2/25 off milrinone : normal to mildly elevated filling pressures and preserved Fick and TD  Suspect tachymediated cardiomyoapthy in setting of atrial fibrillation with RVR, ETOH abuse, +/- viral illness (had Coronavirus OC43).  - Presumed NICM has not had cath (likely does not need at this point) - hstrop 22, 22 - Echo 02/21/23 (with maintenance of SR): EF 60-65%, RV mildly reduced - Now with recurrent HF in setting of recurrent AF and med noncompliance - Echo LVEF < 20% severe RV dysfunction.  CO-OX 51%. Off milrinone .  -CVP 13-14 . Given 80 mg IV lasix  now -GDMT limited by  soft BP.    2. Paroxysmal atrial fibrillation - Diagnosed 12/24 - S/p DCCV to SR 2/25 - Now with recurrent AF of unknown duration . Remains in A fib RVR. - Has been noncompliant with amio and Eliquis  - Continue amio drip.  - Continue Eliquis  for now (may need to switch to heparin  if unstable) -  TEE/DC-CV today.     3. ETOH abuse - Back to drinking, discussed cessation  - Cessation imperative.  - CIWA protocol    4. Snoring - Needs sleep study  5. CKD stage IIIa Creatinine baseline ~ 1.3-1.4--> 1.6 today.  Follow daily BMET  Avoid hypotension.   Give 80 mg IV lasix  now. TEE/DC-CV later this morning.      Length of Stay: 4  Kynlei Piontek, NP  10/12/2023, 10:16 AM  Advanced Heart Failure Team Pager [phone] (M-F; 7a - 5p)  Please contact [facility] for night-coverage after hours (5p -7a ) and weekends on [email or web address]

## 2023-10-13 DIAGNOSIS — I5043 Acute on chronic combined systolic (congestive) and diastolic (congestive) heart failure: Secondary | ICD-10-CM

## 2023-10-13 LAB — CBC
HCT: 35 % — ABNORMAL LOW (ref 39.0–52.0)
Hemoglobin: 11.3 g/dL — ABNORMAL LOW (ref 13.0–17.0)
MCH: 31.8 pg (ref 26.0–34.0)
MCHC: 32.3 g/dL (ref 30.0–36.0)
MCV: 98.6 fL (ref 80.0–100.0)
Platelets: 179 K/uL (ref 150–400)
RBC: 3.55 MIL/uL — ABNORMAL LOW (ref 4.22–5.81)
RDW: 14.4 % (ref 11.5–15.5)
WBC: 6.1 K/uL (ref 4.0–10.5)
nRBC: 0 % (ref 0.0–0.2)

## 2023-10-13 LAB — BASIC METABOLIC PANEL WITH GFR
Anion gap: 13 (ref 5–15)
BUN: 23 mg/dL (ref 8–23)
CO2: 31 mmol/L (ref 22–32)
Calcium: 8.4 mg/dL — ABNORMAL LOW (ref 8.9–10.3)
Chloride: 90 mmol/L — ABNORMAL LOW (ref 98–111)
Creatinine, Ser: 1.59 mg/dL — ABNORMAL HIGH (ref 0.61–1.24)
GFR, Estimated: 48 mL/min — ABNORMAL LOW (ref >60)
Glucose, Bld: 111 mg/dL — ABNORMAL HIGH (ref 70–99)
Potassium: 4 mmol/L (ref 3.5–5.1)
Sodium: 134 mmol/L — ABNORMAL LOW (ref 135–145)

## 2023-10-13 LAB — COOXEMETRY PANEL
Carboxyhemoglobin: 1.3 % (ref 0.5–1.5)
Carboxyhemoglobin: 2.2 % — ABNORMAL HIGH (ref 0.5–1.5)
Methemoglobin: <0.7 % (ref 0.0–1.5)
Methemoglobin: <0.7 % (ref 0.0–1.5)
O2 Saturation: 49.3 %
O2 Saturation: 54.7 %
Total hemoglobin: 11.5 g/dL — ABNORMAL LOW (ref 12.0–16.0)
Total hemoglobin: 11.4 g/dL — ABNORMAL LOW (ref 12.0–16.0)

## 2023-10-13 LAB — MAGNESIUM: Magnesium: 2.3 mg/dL (ref 1.7–2.4)

## 2023-10-13 LAB — URIC ACID: Uric Acid, Serum: 9.7 mg/dL — ABNORMAL HIGH (ref 3.7–8.6)

## 2023-10-13 MED ORDER — DIGOXIN 125 MCG PO TABS
0.1250 mg | ORAL_TABLET | Freq: Every day | ORAL | Status: DC
  Administered 2023-10-13 – 2023-10-15 (×3): 0.125 mg via ORAL
  Filled 2023-10-13 (×3): qty 1

## 2023-10-13 MED ORDER — POTASSIUM CHLORIDE CRYS ER 20 MEQ PO TBCR
40.0000 meq | EXTENDED_RELEASE_TABLET | Freq: Two times a day (BID) | ORAL | Status: AC
  Administered 2023-10-13 (×2): 40 meq via ORAL
  Filled 2023-10-13 (×2): qty 2

## 2023-10-13 MED ORDER — ACETAMINOPHEN 325 MG PO TABS
650.0000 mg | ORAL_TABLET | Freq: Four times a day (QID) | ORAL | Status: DC | PRN
  Administered 2023-10-13: 650 mg via ORAL
  Filled 2023-10-13: qty 2

## 2023-10-13 MED ORDER — POTASSIUM CHLORIDE CRYS ER 20 MEQ PO TBCR
40.0000 meq | EXTENDED_RELEASE_TABLET | Freq: Once | ORAL | Status: DC
  Filled 2023-10-13: qty 2

## 2023-10-13 MED ORDER — FUROSEMIDE 10 MG/ML IJ SOLN
160.0000 mg | Freq: Two times a day (BID) | INTRAVENOUS | Status: DC
  Administered 2023-10-13 (×2): 160 mg via INTRAVENOUS
  Filled 2023-10-13 (×3): qty 16
  Filled 2023-10-13: qty 10

## 2023-10-13 MED ORDER — FUROSEMIDE 10 MG/ML IJ SOLN
80.0000 mg | Freq: Two times a day (BID) | INTRAMUSCULAR | Status: DC
  Filled 2023-10-13: qty 8

## 2023-10-13 MED ORDER — PREDNISONE 20 MG PO TABS
40.0000 mg | ORAL_TABLET | Freq: Every day | ORAL | Status: AC
  Administered 2023-10-13 – 2023-10-15 (×3): 40 mg via ORAL
  Filled 2023-10-13 (×3): qty 2

## 2023-10-13 MED ORDER — METOLAZONE 5 MG PO TABS
5.0000 mg | ORAL_TABLET | Freq: Once | ORAL | Status: AC
  Administered 2023-10-13: 5 mg via ORAL
  Filled 2023-10-13: qty 1

## 2023-10-13 NOTE — TOC Initial Note (Signed)
 Transition of Care [facility]) - Initial/Assessment Note    Patient Details  Name: Justin Fleming MRN: [id number] Date of Birth: 11-29-1960  [facility] CM/SW Contact:    Arlana JINNY Nicholaus ISRA[place] Phone Number: [phone] 10/13/2023, 11:42 AM  Clinical Narrative:       HF CSW met with patient at bedside. Patient stated that at this time he is living with his sister. Patient stated that he is still driving. Patient stated that he has no history of HH services. Patient stated that he has a cane and walker at home, but does not use them at this time. Patient stated that he has a scale at home. Patient stated that he has a PCP. CSW explained that hospital follow up appointments are typically scheduled closer towards dc. Patient agrees and any appt. Time will work for him.            CSW addressed SA consult. Patient stated that he did not need any resources at this time.   HF CSW/CM will continue to follow and monitor for dc readiness.         Patient Goals and CMS Choice            Expected Discharge Plan and Services                                              Prior Living Arrangements/Services                       Activities of Daily Living   ADL Screening (condition at time of admission) Independently performs ADLs?: Yes (appropriate for developmental age) Is the patient deaf or have difficulty hearing?: No Does the patient have difficulty seeing, even when wearing glasses/contacts?: No Does the patient have difficulty concentrating, remembering, or making decisions?: No  Permission Sought/Granted                  Emotional Assessment              Admission diagnosis:  Atrial fibrillation with rapid ventricular response (HCC) [I48.91] Atrial fibrillation with RVR (HCC) [I48.91] Heart failure (HCC) [I50.9] Acute on chronic congestive heart failure, unspecified heart failure type [facility]) [I50.9] Patient Active Problem List    Diagnosis Date Noted   Acute combined systolic and diastolic heart failure (HCC) 10/10/2023   Acute on chronic congestive heart failure (HCC) 10/08/2023   Uncomplicated alcohol dependence (HCC) 06/01/2023   Physical exam, annual 04/08/2023   Encounter for Medicare annual wellness exam 04/08/2023   Heart failure with reduced ejection fraction (HCC) 04/08/2023   Absolute anemia 04/07/2023   Folate deficiency 04/07/2023   Chronic gout without tophus 03/04/2023   Stage 3a chronic kidney disease (HCC) 02/23/2023   History of atrial fibrillation 02/23/2023   Atrial fibrillation with RVR (HCC) 02/08/2023   Acute on chronic diastolic CHF (congestive heart failure) (HCC) 02/08/2023   Alcohol induced fatty liver 02/08/2023   URI (upper respiratory infection) 02/08/2023   Severe sepsis (HCC) 01/16/2023   Hypokalemia 01/15/2023   Acute idiopathic gout 01/14/2023   Acute heart failure with preserved ejection fraction (HFpEF) (HCC) 01/13/2023   Elevated troponin 01/07/2023   Vitamin B12 deficiency 01/07/2023   Chronic anemia 01/04/2023   Persistent atrial fibrillation (HCC) 01/01/2023   Anemia due to acute blood loss 01/01/2023   Alcohol intoxication 12/30/2022  Atrial fibrillation with rapid ventricular response (HCC) 12/30/2022   Lactic acidosis 12/30/2022   MDD (major depressive disorder) (HCC) 09/12/2014   Alcohol abuse 09/05/2014   Hepatomegaly 05/23/2014   Insomnia 07/12/2013   GAD (generalized anxiety disorder) 06/23/2013   Essential hypertension, benign 06/23/2013   Tremor 06/23/2013   GERD (gastroesophageal reflux disease) 06/23/2013   Tobacco use disorder 06/23/2013   PCP:  Kayla Jeoffrey RAMAN, FNP Pharmacy:   [facility] [phone] - [place], [place] - [address] AT [facility] [address] [place] [address] Phone: [phone] Fax: [phone]  [facility] [address] [place] [address] Phone: [phone] Fax:  [phone]     Social Drivers of Health (SDOH) Social History: SDOH Screenings   Food Insecurity: No Food Insecurity (10/08/2023)  Housing: Low Risk  (10/08/2023)  Transportation Needs: No Transportation Needs (10/08/2023)  Utilities: Not At Risk (10/08/2023)  Alcohol Screen: Low Risk  (04/08/2023)  Depression (PHQ2-9): Low Risk  (06/01/2023)  Financial Resource Strain: Low Risk  (04/08/2023)  Physical Activity: Insufficiently Active (04/08/2023)  Social Connections: Socially Isolated (10/08/2023)  Tobacco Use: Medium Risk (06/01/2023)   SDOH Interventions:     Readmission Risk Interventions    02/22/2023   11:41 AM 01/11/2023   12:44 PM  Readmission Risk Prevention Plan  Transportation Screening Complete Complete  PCP or Specialist Appt within 5-7 Days  Not Complete  Home Care Screening  Complete  Medication Review (RN CM)  Complete  Social Work Consult for Recovery Care Planning/Counseling Complete   Palliative Care Screening Not Applicable   Medication Review Oceanographer) Complete

## 2023-10-13 NOTE — Plan of Care (Signed)

## 2023-10-13 NOTE — Plan of Care (Signed)
  Problem: Education: Goal: Knowledge of General Education information will improve Description: Including pain rating scale, medication(s)/side effects and non-pharmacologic comfort measures Outcome: Progressing   Problem: Health Behavior/Discharge Planning: Goal: Ability to manage health-related needs will improve Outcome: Progressing   Problem: Clinical Measurements: Goal: Ability to maintain clinical measurements within normal limits will improve Outcome: Progressing Goal: Will remain free from infection Outcome: Progressing Goal: Diagnostic test results will improve Outcome: Progressing Goal: Respiratory complications will improve Outcome: Progressing Goal: Cardiovascular complication will be avoided Outcome: Progressing   Problem: Activity: Goal: Risk for activity intolerance will decrease Outcome: Progressing   Problem: Nutrition: Goal: Adequate nutrition will be maintained Outcome: Progressing   Problem: Coping: Goal: Level of anxiety will decrease Outcome: Progressing   Problem: Elimination: Goal: Will not experience complications related to bowel motility Outcome: Progressing Goal: Will not experience complications related to urinary retention Outcome: Progressing   Problem: Pain Managment: Goal: General experience of comfort will improve and/or be controlled Outcome: Progressing   Problem: Safety: Goal: Ability to remain free from injury will improve Outcome: Progressing   Problem: Skin Integrity: Goal: Risk for impaired skin integrity will decrease Outcome: Progressing   Problem: Education: Goal: Ability to demonstrate management of disease process will improve Outcome: Progressing Goal: Ability to verbalize understanding of medication therapies will improve Outcome: Progressing Goal: Individualized Educational Video(s) Outcome: Progressing   Problem: Activity: Goal: Capacity to carry out activities will improve Outcome: Progressing    Problem: Cardiac: Goal: Ability to achieve and maintain adequate cardiopulmonary perfusion will improve Outcome: Progressing   Problem: Education: Goal: Knowledge of disease or condition will improve Outcome: Progressing Goal: Understanding of medication regimen will improve Outcome: Progressing Goal: Individualized Educational Video(s) Outcome: Progressing   Problem: Activity: Goal: Ability to tolerate increased activity will improve Outcome: Progressing   Problem: Cardiac: Goal: Ability to achieve and maintain adequate cardiopulmonary perfusion will improve Outcome: Progressing   Problem: Health Behavior/Discharge Planning: Goal: Ability to safely manage health-related needs after discharge will improve Outcome: Progressing

## 2023-10-13 NOTE — Anesthesia Postprocedure Evaluation (Signed)
 Anesthesia Post Note  Patient: Justin Fleming  Procedure(s) Performed: TRANSESOPHAGEAL ECHOCARDIOGRAM CARDIOVERSION     Anesthetic complications: no   No notable events documented.  Last Vitals:  Vitals:   10/13/23 1911 10/13/23 2005  BP: (!) 89/70 92/71  Pulse: (!) 113   Resp:  20  Temp:  37.2 C  SpO2: 93%     Last Pain:  Vitals:   10/13/23 2005  TempSrc: Oral  PainSc:                  Norleen Pope

## 2023-10-13 NOTE — Progress Notes (Addendum)
 Advanced Heart Failure Rounding Note  Cardiologist: Jayson Sierras, MD  Chief Complaint: A/C HFrEF Subjective:   10/6 Milrinone  stopped.  10/7 TEE/DCCV>>NSR  Maintaining NSR. Amio gtt @ 60/hr   Co-ox low at 49%, FICK CI 1.70   800 cc in UOP yesterday w/ IV Lasix . CVP 16 today. SCr stable at 1.6. SBPs remain soft low 90s.   Sitting up in bed. Says breathing is improving but still not back to 100%. C/w orthopnea. Sitting w/ HOB elevated. Also complaining of left ankle and knee pain, feels like previous gout flares.    Objective:   Weight Range: 89.9 kg Body mass index is 27.64 kg/m.   Vital Signs:   Temp:  [97.5 F (36.4 C)-99.6 F (37.6 C)] 99.1 F (37.3 C) (10/08 0852) Pulse Rate:  [79-119] 84 (10/08 0852) Resp:  [15-21] 20 (10/08 0852) BP: (76-102)/(59-78) 98/77 (10/08 0852) SpO2:  [91 %-98 %] 95 % (10/08 0852) Weight:  [89.9 kg] 89.9 kg (10/08 0500) Last BM Date : 10/11/23  Weight change: Filed Weights   10/12/23 0500 10/12/23 2130 10/13/23 0500  Weight: 89.6 kg 89.9 kg 89.9 kg    Intake/Output:   Intake/Output Summary (Last 24 hours) at 10/13/2023 0900 Last data filed at 10/13/2023 0400 Gross per 24 hour  Intake 130 ml  Output 800 ml  Net -670 ml      Physical Exam   CVP 16  General:   fatigued appearing, NAD  Neck: JVD elevated to jaw  Cor: RRR, no murmur,  2+ b/l LEE  Lungs: diminished at the bases bilaterally Abdomen: soft, NT, ND  Extremities: trace b/l LEE + RUE PICC  Neuro: A&Ox3, moves all 4 ext w/o difficulty   Telemetry   NSR 90s, personally reviewed    EKG   N/A    Labs    CBC Recent Labs    10/12/23 0543 10/13/23 0226  WBC 7.5 6.1  HGB 11.1* 11.3*  HCT 34.8* 35.0*  MCV 99.1 98.6  PLT 186 179   Basic Metabolic Panel Recent Labs    89/92/74 0543 10/12/23 2007 10/13/23 0226  NA 133* 135 134*  K 4.4 3.7 4.0  CL 92* 93* 90*  CO2 30 30 31   GLUCOSE 158* 165* 111*  BUN 21 21 23   CREATININE 1.60* 1.61* 1.59*   CALCIUM 8.6* 8.3* 8.4*  MG 2.3  --  2.3   Liver Function Tests No results for input(s): AST, ALT, ALKPHOS, BILITOT, PROT, ALBUMIN  in the last 72 hours.  No results for input(s): LIPASE, AMYLASE in the last 72 hours. Cardiac Enzymes No results for input(s): CKTOTAL, CKMB, CKMBINDEX, TROPONINI in the last 72 hours.  BNP: BNP (last 3 results) Recent Labs    03/29/23 0922 04/27/23 0925 10/08/23 1205  BNP 291.2* 489.5* 522.8*    ProBNP (last 3 results) No results for input(s): PROBNP in the last 8760 hours.   D-Dimer No results for input(s): DDIMER in the last 72 hours. Hemoglobin A1C No results for input(s): HGBA1C in the last 72 hours. Fasting Lipid Panel No results for input(s): CHOL, HDL, LDLCALC, TRIG, CHOLHDL, LDLDIRECT in the last 72 hours. Thyroid Function Tests No results for input(s): TSH, T4TOTAL, T3FREE, THYROIDAB in the last 72 hours.  Invalid input(s): FREET3   Other results:   Imaging    EP STUDY Result Date: 10/12/2023 See surgical note for result.     Medications:     Scheduled Medications:  allopurinol  100 mg Oral Daily  apixaban   5 mg Oral BID   folic acid   1 mg Oral Daily   multivitamin with minerals  1 tablet Oral Daily   sodium chloride  flush  10-40 mL Intracatheter Q12H   thiamine   100 mg Oral Daily   Or   thiamine   100 mg Intravenous Daily    Infusions:  amiodarone  60 mg/hr (10/13/23 0847)    PRN Medications: acetaminophen , mouth rinse, sodium chloride  flush    Patient Profile   63 y.o. male with history of alcohol abuse, tobacco use, atrial fibrillation, anxiety/depression, HTN, GERD.   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  - Presumed NICM has not had cath (likely does not need at this point) - hstrop 22, 22 - Echo 02/21/23 (with maintenance of SR): EF 60-65%, RV mildly reduced - Now with recurrent HF in setting of recurrent AF and med noncompliance - Echo LVEF < 20% severe RV dysfunction.  - Placed on milrinone  to support diuresis. Milrinone  discontinued 10/6. Co-ox 49% today, post cardioversion. FICK CI 1.7   - CVP 16. SCr stable at 1.6  - will repeat co-ox  - add digoxin  0.125 mg daily for inotropic support  - BP too soft for additional GDMT  - continue diuresis, increase IV Lasix  160 mg bid + 5 mg of metolazone   - ideally, would like to avoid restarting milrinone  to reduce risk of afib recurrence but, if unable to effectively diuresis, may need to restart at low dose  - monitor SCr/UOP and Co-ox closely    2. Paroxysmal atrial fibrillation - Diagnosed 12/24 - S/p DCCV to SR 2/25 - Readmitted w/ recurrent AF of unknown duration  - s/p TEE/DCCV>>NSR 10/7. Maitaining NSR  - Continue amio drip for now.  - Continue Eliquis   - add digoxin  0.125    3. ETOH abuse - Back to drinking, discussed cessation  - Cessation imperative.  - CIWA protocol    4. Snoring - Needs outpatient sleep study  5. CKD stage IIIa Creatinine baseline ~ 1.3-1.4--> 1.6->1.6 today.  Follow daily BMET  Avoid hypotension.   6. Presumed Acute Gout Flare  - Lt ankle and knee - check Uric acid, if elevated will treat w/ prednisone  burst   7. IDA - T sat 8%, Ferritin 184 - needs IV Fe     Length of Stay: 5  Brittainy Simmons, PA-C  10/13/2023, 9:00 AM  Advanced Heart Failure Team Pager [phone] (M-F; 7a - 5p)  Please contact [facility] for night-coverage after hours (5p -7a ) and weekends on [email or web address]  Patient seen and examined with the above-signed Advanced Practice Provider and/or  Housestaff. I personally reviewed laboratory data, imaging studies and relevant notes. I independently examined the patient and formulated the important aspects of the plan. I have edited the note to reflect any of my changes or salient points. I have personally discussed the plan with the patient and/or family.  Remains in NSR.   Denies CP or SOB. Off milrinone . Co-ox marginal. CVP 16  General:  Sitting up in bed  No resp difficulty HEENT: normal Neck: supple. JVP to ear .  Cor:RRR 2/6 TR Lungs:  clear Abdomen: soft, nontender, nondistended. No hepatosplenomegaly. No bruits or masses. Good bowel sounds. Extremities: no cyanosis, clubbing, rash, 3+ edema Neuro: alert & orientedx3, cranial nerves grossly intact. moves all 4 extremities w/o difficulty. Affect pleasant  He is in NSR.  Remains markedly volume overloaded. Increase lasix  to 160 iv bid. Add metolazone . Wrap legs  If not responding will need to restart milrinone .   Toribio Fuel, MD  2:35 PM

## 2023-10-13 NOTE — Progress Notes (Signed)
 Orthopedic Tech Progress Note Patient Details:  Justin Fleming [birth year] [id number]  Ortho Devices Type of Ortho Device: Ace wrap, Unna boot Ortho Device/Splint Location: BLE Ortho Device/Splint Interventions: Ordered, Application, Adjustment  spoke with PA to make sure it was ok to apply UNNA BOOTS at that time and was told I could    Post Interventions Patient Tolerated: Well Instructions Provided: Care of device  Delanna CROME Pac 10/13/2023, 12:12 PM

## 2023-10-14 DIAGNOSIS — I5043 Acute on chronic combined systolic (congestive) and diastolic (congestive) heart failure: Secondary | ICD-10-CM | POA: Diagnosis not present

## 2023-10-14 LAB — CBC
HCT: 35.4 % — ABNORMAL LOW (ref 39.0–52.0)
Hemoglobin: 11.8 g/dL — ABNORMAL LOW (ref 13.0–17.0)
MCH: 31.8 pg (ref 26.0–34.0)
MCHC: 33.3 g/dL (ref 30.0–36.0)
MCV: 95.4 fL (ref 80.0–100.0)
Platelets: 188 K/uL (ref 150–400)
RBC: 3.71 MIL/uL — ABNORMAL LOW (ref 4.22–5.81)
RDW: 13.8 % (ref 11.5–15.5)
WBC: 5 K/uL (ref 4.0–10.5)
nRBC: 0 % (ref 0.0–0.2)

## 2023-10-14 LAB — BASIC METABOLIC PANEL WITH GFR
Anion gap: 11 (ref 5–15)
BUN: 30 mg/dL — ABNORMAL HIGH (ref 8–23)
CO2: 33 mmol/L — ABNORMAL HIGH (ref 22–32)
Calcium: 8.5 mg/dL — ABNORMAL LOW (ref 8.9–10.3)
Chloride: 88 mmol/L — ABNORMAL LOW (ref 98–111)
Creatinine, Ser: 1.75 mg/dL — ABNORMAL HIGH (ref 0.61–1.24)
GFR, Estimated: 43 mL/min — ABNORMAL LOW (ref >60)
Glucose, Bld: 159 mg/dL — ABNORMAL HIGH (ref 70–99)
Potassium: 3.9 mmol/L (ref 3.5–5.1)
Sodium: 132 mmol/L — ABNORMAL LOW (ref 135–145)

## 2023-10-14 LAB — COOXEMETRY PANEL
Carboxyhemoglobin: 3.3 % — ABNORMAL HIGH (ref 0.5–1.5)
Methemoglobin: <0.7 % (ref 0.0–1.5)
O2 Saturation: 65.6 %
Total hemoglobin: 12.9 g/dL (ref 12.0–16.0)

## 2023-10-14 LAB — MAGNESIUM: Magnesium: 2 mg/dL (ref 1.7–2.4)

## 2023-10-14 MED ORDER — AMIODARONE HCL 200 MG PO TABS
200.0000 mg | ORAL_TABLET | Freq: Two times a day (BID) | ORAL | Status: DC
  Administered 2023-10-14 – 2023-10-15 (×3): 200 mg via ORAL
  Filled 2023-10-14 (×3): qty 1

## 2023-10-14 MED ORDER — FUROSEMIDE 10 MG/ML IJ SOLN
80.0000 mg | Freq: Once | INTRAMUSCULAR | Status: AC
  Administered 2023-10-14: 80 mg via INTRAVENOUS
  Filled 2023-10-14: qty 8

## 2023-10-14 MED ORDER — SODIUM CHLORIDE 0.9 % IV SOLN
500.0000 mg | Freq: Once | INTRAVENOUS | Status: AC
  Administered 2023-10-14: 500 mg via INTRAVENOUS
  Filled 2023-10-14: qty 25

## 2023-10-14 MED ORDER — IRON SUCROSE 500 MG IVPB - SIMPLE MED
500.0000 mg | Freq: Once | INTRAVENOUS | Status: DC
  Filled 2023-10-14: qty 275

## 2023-10-14 NOTE — Plan of Care (Signed)
  Problem: Education: Goal: Knowledge of General Education information will improve Description: Including pain rating scale, medication(s)/side effects and non-pharmacologic comfort measures Outcome: Progressing   Problem: Health Behavior/Discharge Planning: Goal: Ability to manage health-related needs will improve Outcome: Progressing   Problem: Clinical Measurements: Goal: Ability to maintain clinical measurements within normal limits will improve Outcome: Progressing Goal: Will remain free from infection Outcome: Progressing Goal: Diagnostic test results will improve Outcome: Progressing Goal: Respiratory complications will improve Outcome: Progressing Goal: Cardiovascular complication will be avoided Outcome: Progressing   Problem: Activity: Goal: Risk for activity intolerance will decrease Outcome: Progressing   Problem: Nutrition: Goal: Adequate nutrition will be maintained Outcome: Progressing   Problem: Coping: Goal: Level of anxiety will decrease Outcome: Progressing   Problem: Elimination: Goal: Will not experience complications related to bowel motility Outcome: Progressing Goal: Will not experience complications related to urinary retention Outcome: Progressing   Problem: Pain Managment: Goal: General experience of comfort will improve and/or be controlled Outcome: Progressing   Problem: Safety: Goal: Ability to remain free from injury will improve Outcome: Progressing   Problem: Skin Integrity: Goal: Risk for impaired skin integrity will decrease Outcome: Progressing   Problem: Education: Goal: Ability to demonstrate management of disease process will improve Outcome: Progressing Goal: Ability to verbalize understanding of medication therapies will improve Outcome: Progressing Goal: Individualized Educational Video(s) Outcome: Progressing   Problem: Activity: Goal: Capacity to carry out activities will improve Outcome: Progressing    Problem: Cardiac: Goal: Ability to achieve and maintain adequate cardiopulmonary perfusion will improve Outcome: Progressing   Problem: Education: Goal: Knowledge of disease or condition will improve Outcome: Progressing Goal: Understanding of medication regimen will improve Outcome: Progressing Goal: Individualized Educational Video(s) Outcome: Progressing   Problem: Activity: Goal: Ability to tolerate increased activity will improve Outcome: Progressing   Problem: Cardiac: Goal: Ability to achieve and maintain adequate cardiopulmonary perfusion will improve Outcome: Progressing   Problem: Health Behavior/Discharge Planning: Goal: Ability to safely manage health-related needs after discharge will improve Outcome: Progressing

## 2023-10-14 NOTE — Evaluation (Signed)
 Physical Therapy Brief Evaluation and Discharge Note Patient Details Name: Justin Fleming MRN: [id number] DOB: 12/04/1960 Today's Date: 10/14/2023   History of Present Illness  63 y.o. M adm 10/08/23 with LB edema, DOE and acute HF exacerbation. 10/8 TEE. PMhx: Afib, CKD, gout, GERD, ETOH abuse, GAD, HTN, HFrEF  Clinical Impression  Pt very pleasant and wanting to walk. Pt lives with sister, drives and cares for himself at baseline. Pt able to perform hall ambulation, stairs and transfers without physical assist. HR 85-104 with activity with SPO2 >90% on RA. Pt admittedly does not weigh daily and walks in home. Pt educated for diet restrictions, daily weights and progressive walking program with pt verbalizing understanding. Legs elevated at rest and pt encouraged to continue mobility acutely. No further therapy needs with pt aware and agreeable, will sign off.        PT Assessment Patient does not need any further PT services  Assistance Needed at Discharge  None    Equipment Recommendations None recommended by PT  Recommendations for Other Services       Precautions/Restrictions Precautions Precautions: None        Mobility  Bed Mobility Rolling: Modified independent (Device/Increase time)        Transfers Overall transfer level: Modified independent                      Ambulation/Gait Ambulation/Gait assistance: Supervision Gait Distance (Feet): 600 Feet Assistive device: None Gait Pattern/deviations: WFL(Within Functional Limits) Gait Speed: Pace WFL General Gait Details: supervision for lines only  Home Activity Instructions    Stairs Stairs: Yes Stairs assistance: Modified independent (Device/Increase time) Stair Management: Forwards, Step to pattern, One rail Right Number of Stairs: 3 General stair comments: assist for IV pole  Modified Rankin (Stroke Patients Only)        Balance Overall balance assessment: No apparent balance deficits  (not formally assessed)                        Pertinent Vitals/Pain PT - Brief Vital Signs All Vital Signs Stable: Yes (HR 84-104, SPO2 92% on RA with gait 97% at rest) Pain Assessment Pain Assessment: No/denies pain     Home Living Family/patient expects to be discharged to:: Private residence Living Arrangements: Other relatives Available Help at Discharge: Family;Available PRN/intermittently Home Environment: Stairs to enter  Stairs-Number of Steps: 3 Home Equipment: Agricultural consultant (2 wheels);Cane - single point        Prior Function Level of Independence: Independent Comments: lives with sister, drives, cares for himself    UE/LE Assessment   UE ROM/Strength/Tone/Coordination: WFL    LE ROM/Strength/Tone/Coordination: [facility]      Communication   Communication Communication: No apparent difficulties     Cognition Overall Cognitive Status: Appears within functional limits for tasks assessed/performed       General Comments      Exercises     Assessment/Plan    PT Problem List         PT Visit Diagnosis Other abnormalities of gait and mobility (R26.89)    No Skilled PT All education completed;Patient is modified independent with all activity/mobility   Co-evaluation                AMPAC 6 Clicks Help needed turning from your back to your side while in a flat bed without using bedrails?: None Help needed moving from lying on your back to sitting on the side  of a flat bed without using bedrails?: None Help needed moving to and from a bed to a chair (including a wheelchair)?: None Help needed standing up from a chair using your arms (e.g., wheelchair or bedside chair)?: None Help needed to walk in hospital room?: None Help needed climbing 3-5 steps with a railing? : None 6 Click Score: 24      End of Session   Activity Tolerance: Patient tolerated treatment well Patient left: in chair;with call bell/phone within reach Nurse  Communication: Mobility status PT Visit Diagnosis: Other abnormalities of gait and mobility (R26.89)     Time: 8778-8752 PT Time Calculation (min) (ACUTE ONLY): 26 min  Charges:   PT Evaluation $PT Eval Low Complexity: 1 Low PT Treatments $Gait Training: 8-22 mins    Lenoard SQUIBB, PT Acute Rehabilitation Services Office: [phone]   Lenoard NOVAK Melo Stauber  10/14/2023, 12:57 PM

## 2023-10-14 NOTE — Procedures (Signed)
   TRANSESOPHAGEAL ECHOCARDIOGRAM GUIDED DIRECT CURRENT CARDIOVERSION  NAME:  Justin Fleming    MRN: [id number] DOB:  1960-10-31    ADMIT DATE: 10/08/2023  INDICATIONS: Symptomatic atrial fibrillation  PROCEDURE:   Informed consent was obtained prior to the procedure. The risks, benefits and alternatives for the procedure were discussed and the patient comprehended these risks.  Risks include, but are not limited to, cough, sore throat, vomiting, nausea, somnolence, esophageal and stomach trauma or perforation, bleeding, low blood pressure, aspiration, pneumonia, infection, trauma to the teeth and death.    After a procedural timeout, the patient was administered propofol  per anesthesia.  The patient's heart rate, blood pressure, and oxygen saturation were monitored continuously during the procedure. The period of conscious sedation was 18 minutes, of which I was present face-to-face 100% of this time.  The transesophageal probe was inserted in the esophagus and stomach without difficulty and multiple views were obtained.  The patient was kept under observation until the patient left the procedure room.  The patient left the procedure room in stable condition.    COMPLICATIONS:    Complications: No complications Patient tolerated procedure well.  KEY FINDINGS:  Severely reduced biventricular function No LAA thrombus. Full Report to follow.   CARDIOVERSION:     Indications:  Symptomatic Atrial Fibrillation  Procedure Details:  Once the TEE was complete, the patient had the defibrillator pads placed in the anterior and posterior position. Once an appropriate level of sedation was confirmed, the patient was cardioverted x 1 with 200J of biphasic synchronized energy.  The patient converted to NSR.  There were no apparent complications.  The patient had normal neuro status and respiratory status post procedure with vitals stable as recorded elsewhere.  Adequate airway was maintained  throughout and vital signs monitored per protocol.  Morene Brownie Advanced Heart Failure 9:01 PM

## 2023-10-14 NOTE — Plan of Care (Signed)
   Problem: Education: Goal: Knowledge of General Education information will improve Description: Including pain rating scale, medication(s)/side effects and non-pharmacologic comfort measures Outcome: Progressing   Problem: Health Behavior/Discharge Planning: Goal: Ability to manage health-related needs will improve Outcome: Progressing   Problem: Clinical Measurements: Goal: Ability to maintain clinical measurements within normal limits will improve Outcome: Progressing Goal: Will remain free from infection Outcome: Progressing Goal: Respiratory complications will improve Outcome: Progressing Goal: Cardiovascular complication will be avoided Outcome: Progressing   Problem: Coping: Goal: Level of anxiety will decrease Outcome: Progressing

## 2023-10-14 NOTE — Discharge Summary (Incomplete)
 Advanced Heart Failure Team  Discharge Summary   Patient ID: Justin Fleming MRN: [id number], DOB/AGE: 01/27/1960 63 y.o. Admit date: 10/08/2023 D/C date:     10/14/2023   Primary Discharge Diagnoses:  ***  Secondary Discharge Diagnoses:  ***  Hospital Course:   Justin Fleming is a 63 y.o. male with history of alcohol abuse, tobacco use, atrial fibrillation, anxiety/depression, HTN, GERD.    Admitted to [facility] 10/08/23 with acute decompensated heart failure.        Discharge Weight:  Discharge Vitals: Blood pressure 90/73, pulse 74, temperature 97.9 F (36.6 C), temperature source Oral, resp. rate 18, height 5' 11 (1.803 m), weight 85.1 kg, SpO2 94%.  Labs: Lab Results  Component Value Date   WBC 5.0 10/14/2023   HGB 11.8 (L) 10/14/2023   HCT 35.4 (L) 10/14/2023   MCV 95.4 10/14/2023   PLT 188 10/14/2023    Recent Labs  Lab 10/09/23 0310 10/10/23 0301 10/14/23 0350  NA 138   < > 132*  K 4.0   < > 3.9  CL 100   < > 88*  CO2 28   < > 33*  BUN 15   < > 30*  CREATININE 1.37*   < > 1.75*  CALCIUM 8.7*   < > 8.5*  PROT 6.4*  --   --   BILITOT 1.9*  --   --   ALKPHOS 46  --   --   ALT 7  --   --   AST 10*  --   --   GLUCOSE 101*   < > 159*   < > = values in this interval not displayed.   Lab Results  Component Value Date   CHOL 147 12/30/2022   HDL 66 12/30/2022   LDLCALC 69 12/30/2022   TRIG 59 12/30/2022   BNP (last 3 results) Recent Labs    03/29/23 0922 04/27/23 0925 10/08/23 1205  BNP 291.2* 489.5* 522.8*    ProBNP (last 3 results) No results for input(s): PROBNP in the last 8760 hours.   Diagnostic Studies/Procedures   No results found.  Discharge Medications   Allergies as of 10/14/2023       Reactions   Chlorhexidine  Gluconate Rash     Med Rec must be completed prior to using this [facility]***       Disposition   The patient will be discharged in stable condition to home.      Duration of Discharge  Encounter: *** Time   [place] Lee, NP 10/14/2023, 2:31 PM

## 2023-10-14 NOTE — Progress Notes (Addendum)
 Advanced Heart Failure Rounding Note  Cardiologist: Jayson Sierras, MD  Chief Complaint: A/C HFrEF Subjective:    10/6: Milrinone  stopped.  10/7: TEE/DCCV>>NSR  Co-ox 66%, CVP 10-11 6.9L UOP (net -5L). Weight down 11lbs. BP soft. SBP 80-90s. sCr 1.61>1.59 >1.75 Maintaining NSR on amio gtt @ 30/hr    Sitting up in bed. Feels well. Able to lie flat to sleep overnight. No CP or dizziness.  Objective:    Weight Range: 85.1 kg Body mass index is 26.16 kg/m.   Vital Signs:   Temp:  [97.5 F (36.4 C)-99.1 F (37.3 C)] 97.5 F (36.4 C) (10/09 0334) Pulse Rate:  [78-115] 78 (10/09 0348) Resp:  [18-20] 18 (10/09 0334) BP: (89-98)/(69-77) 95/72 (10/09 0334) SpO2:  [90 %-95 %] 93 % (10/09 0348) Weight:  [85.1 kg] 85.1 kg (10/09 0600) Last BM Date : 10/13/23  Weight change: Filed Weights   10/12/23 2130 10/13/23 0500 10/14/23 0600  Weight: 89.9 kg 89.9 kg 85.1 kg   Intake/Output:  Intake/Output Summary (Last 24 hours) at 10/14/2023 0821 Last data filed at 10/14/2023 0600 Gross per 24 hour  Intake 1925.41 ml  Output 6900 ml  Net -4974.59 ml    Physical Exam   General: Elderly appearing. No distress on RA Cardiac: JVP ~7cm. S1 and S2 present. No murmurs or rub. Extremities: Warm and dry.  Trace BLE edema.  Neuro: Alert and oriented x3. Affect pleasant. Appears weak.  Telemetry   SR in 80s (personally reviewed)  Labs    CBC Recent Labs    10/13/23 0226 10/14/23 0350  WBC 6.1 5.0  HGB 11.3* 11.8*  HCT 35.0* 35.4*  MCV 98.6 95.4  PLT 179 188   Basic Metabolic Panel Recent Labs    89/91/74 0226 10/14/23 0350  NA 134* 132*  K 4.0 3.9  CL 90* 88*  CO2 31 33*  GLUCOSE 111* 159*  BUN 23 30*  CREATININE 1.59* 1.75*  CALCIUM 8.4* 8.5*  MG 2.3 2.0   BNP (last 3 results) Recent Labs    03/29/23 0922 04/27/23 0925 10/08/23 1205  BNP 291.2* 489.5* 522.8*   Medications:    Scheduled Medications:  allopurinol  100 mg Oral Daily   apixaban   5  mg Oral BID   digoxin   0.125 mg Oral Daily   folic acid   1 mg Oral Daily   multivitamin with minerals  1 tablet Oral Daily   predniSONE   40 mg Oral Q breakfast   sodium chloride  flush  10-40 mL Intracatheter Q12H   thiamine   100 mg Oral Daily   Or   thiamine   100 mg Intravenous Daily    Infusions:  amiodarone  30 mg/hr (10/14/23 0347)   furosemide  160 mg (10/13/23 1624)    PRN Medications: acetaminophen , mouth rinse, sodium chloride  flush  Patient Profile   63 y.o. male with history of alcohol abuse, tobacco use, atrial fibrillation, anxiety/depression, HTN, GERD.   Admitted for A/C HFrEF + A fib RVR  Assessment/Plan   Acute on Chronic HFrEF in setting of AF with RVR - EF preserved on echo 12/24 - Echo 2/25: EF 30-35%, moderately reduced RV, moderate BAE - RHC 2/25: normal to mildly elevated filling pressures and preserved Fick and TD. 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  - Presumed NICM has not had cath (likely does not need at this point) - hstrop 22, 22 - Echo 2/25 (with maintenance of SR): EF 60-65%,  RV mildly reduced - Now with recurrent HF in setting of recurrent AF and med noncompliance - Echo 10/25 LVEF < 20%, severe RV dysfunction.  - Started on milrinone . Milrinone  stopped. Coox remains stable, 66% - CVP 10-11. Give Lasix  80 mg IV once today, PO tomorrow. - continue digoxin  0.125 mg daily for inotropic support  - BP too soft for additional GDMT    2. Paroxysmal atrial fibrillation - Diagnosed 12/24 - S/p DCCV to SR 2/25 - Readmitted w/ recurrent AF of unknown duration  - s/p TEE/DCCV>>NSR 10/7. Maintaining NSR  - switch IV amio to PO - continue Eliquis   - continue digoxin  0.125    3. ETOH abuse - Back to drinking, discussed cessation  - Cessation imperative.  - CIWA protocol    4. Snoring - Needs outpatient sleep study  5. CKD stage IIIa - Creatinine  baseline ~ 1.3-1.4 - sCr 1.61 > 1.59 > 1.75 - Follow daily BMET  - Avoid hypotension.   6. Presumed Acute Gout Flare  - Lt ankle and knee - uric acid 9.7, on pred burst  7. IDA - T sat 8%, Ferritin 184 - will give IV Fe today  PT/OT eval placed. Needs to mobilize.  Length of Stay: 6  [place] Lee, NP  10/14/2023, 8:21 AM  Advanced Heart Failure Team Pager [phone] (M-F; 7a - 5p)  Please contact [facility] for night-coverage after hours (5p -7a ) and weekends on [email or web address]  Patient seen and examined with the above-signed Advanced Practice Provider and/or Housestaff. I personally reviewed laboratory data, imaging studies and relevant notes. I independently examined the patient and formulated the important aspects of the plan. I have edited the note to reflect any of my changes or salient points. I have personally discussed the plan with the patient and/or family.  Diuresed very well. Co-ox 66% CVP 10-11. Remains in NSR. Denies CP, SOB, orthopnea or PND. Scr up just slightly   General:  Lying flat in bed  No resp difficulty HEENT: normal Neck: supple. JVP  to ear Carotids 2+ bilat; no bruits. No lymphadenopathy or thryomegaly appreciated. Cor: PMI nondisplaced. Regular rate & rhythm. No rubs, gallops or murmurs. Lungs: clear Abdomen: soft, nontender, nondistended. No hepatosplenomegaly. No bruits or masses. Good bowel sounds. Extremities: no cyanosis, clubbing, rash, tr edema Neuro: alert & orientedx3, cranial nerves grossly intact. moves all 4 extremities w/o difficulty. Affect pleasant  Overall he has end-stage HF but currently stabilized off inotropic support. VOlume status still slightly elevated. Would continue one more day of IV lasix . Watch renal function carefully. Titrate GDMT as tolerated. Will be very important for him to remain in NSR. Continue amio and Eliquis .   Toribio Fuel, MD  5:55 PM

## 2023-10-15 ENCOUNTER — Encounter (HOSPITAL_COMMUNITY): Payer: Self-pay | Admitting: Family Medicine

## 2023-10-15 ENCOUNTER — Other Ambulatory Visit (HOSPITAL_COMMUNITY): Payer: Self-pay

## 2023-10-15 DIAGNOSIS — I5023 Acute on chronic systolic (congestive) heart failure: Secondary | ICD-10-CM | POA: Diagnosis not present

## 2023-10-15 LAB — COOXEMETRY PANEL
Carboxyhemoglobin: 2.8 % — ABNORMAL HIGH (ref 0.5–1.5)
Methemoglobin: <0.7 % (ref 0.0–1.5)
O2 Saturation: 65.9 %
Total hemoglobin: 11.8 g/dL — ABNORMAL LOW (ref 12.0–16.0)

## 2023-10-15 LAB — CBC
HCT: 33.8 % — ABNORMAL LOW (ref 39.0–52.0)
Hemoglobin: 11.2 g/dL — ABNORMAL LOW (ref 13.0–17.0)
MCH: 31.7 pg (ref 26.0–34.0)
MCHC: 33.1 g/dL (ref 30.0–36.0)
MCV: 95.8 fL (ref 80.0–100.0)
Platelets: 235 K/uL (ref 150–400)
RBC: 3.53 MIL/uL — ABNORMAL LOW (ref 4.22–5.81)
RDW: 13.8 % (ref 11.5–15.5)
WBC: 9.8 K/uL (ref 4.0–10.5)
nRBC: 0 % (ref 0.0–0.2)

## 2023-10-15 LAB — BASIC METABOLIC PANEL WITH GFR
Anion gap: 13 (ref 5–15)
BUN: 31 mg/dL — ABNORMAL HIGH (ref 8–23)
CO2: 34 mmol/L — ABNORMAL HIGH (ref 22–32)
Calcium: 8.7 mg/dL — ABNORMAL LOW (ref 8.9–10.3)
Chloride: 85 mmol/L — ABNORMAL LOW (ref 98–111)
Creatinine, Ser: 1.53 mg/dL — ABNORMAL HIGH (ref 0.61–1.24)
GFR, Estimated: 50 mL/min — ABNORMAL LOW (ref >60)
Glucose, Bld: 110 mg/dL — ABNORMAL HIGH (ref 70–99)
Potassium: 3.4 mmol/L — ABNORMAL LOW (ref 3.5–5.1)
Sodium: 132 mmol/L — ABNORMAL LOW (ref 135–145)

## 2023-10-15 LAB — MAGNESIUM: Magnesium: 2 mg/dL (ref 1.7–2.4)

## 2023-10-15 MED ORDER — AMIODARONE HCL 200 MG PO TABS
200.0000 mg | ORAL_TABLET | Freq: Two times a day (BID) | ORAL | 6 refills | Status: DC
  Filled 2023-10-15: qty 40, 20d supply, fill #0

## 2023-10-15 MED ORDER — TORSEMIDE 20 MG PO TABS
40.0000 mg | ORAL_TABLET | Freq: Every day | ORAL | 6 refills | Status: DC
  Filled 2023-10-15: qty 60, 30d supply, fill #0

## 2023-10-15 MED ORDER — TORSEMIDE 20 MG PO TABS
20.0000 mg | ORAL_TABLET | Freq: Every day | ORAL | Status: DC
  Filled 2023-10-15: qty 1

## 2023-10-15 MED ORDER — DIGOXIN 125 MCG PO TABS
0.1250 mg | ORAL_TABLET | Freq: Every day | ORAL | 12 refills | Status: DC
  Filled 2023-10-15: qty 30, 30d supply, fill #0

## 2023-10-15 MED ORDER — SPIRONOLACTONE 12.5 MG HALF TABLET
12.5000 mg | ORAL_TABLET | Freq: Every day | ORAL | Status: DC
  Administered 2023-10-15: 12.5 mg via ORAL

## 2023-10-15 MED ORDER — TORSEMIDE 20 MG PO TABS
40.0000 mg | ORAL_TABLET | Freq: Every day | ORAL | Status: DC
  Administered 2023-10-15: 40 mg via ORAL
  Filled 2023-10-15: qty 2

## 2023-10-15 MED ORDER — POTASSIUM CHLORIDE ER 10 MEQ PO TBCR
60.0000 meq | EXTENDED_RELEASE_TABLET | Freq: Once | ORAL | Status: AC
  Administered 2023-10-15: 60 meq via ORAL
  Filled 2023-10-15: qty 6

## 2023-10-15 NOTE — TOC Transition Note (Signed)
 Transition of Care [facility]) - Discharge Note   Patient Details  Name: Justin Fleming MRN: [id number] Date of Birth: May 24, 1960  [facility] of [facility]) CM/SW Contact:  Justina Delcia Czar, RN Phone Number: [phone] 10/15/2023, 11:04 AM   Clinical Narrative:    Spoke to pt and lives with sister. Provided pt with Living Better with HF booklet. States he has scale for daily weights. Educated pt on importance of low sodium/heart healthy diet. Pt states he will pick up pillbox for medications.   Scheduled hospital follow up appt with PCP 10/19/2023 at 1030 am. Provided resources for drug and substance abuse. Pt states he plans to quit or reduce the amount of alcohol consumed.    Final next level of care: Home/Self Care Barriers to Discharge: No Barriers Identified   Patient Goals and CMS Choice Patient states their goals for this hospitalization and ongoing recovery are:: wants to remain independent          Discharge Placement                       Discharge Plan and Services Additional resources added to the After Visit Summary for     Discharge Planning Services: CM Consult                                 Social Drivers of Health (SDOH) Interventions SDOH Screenings   Food Insecurity: No Food Insecurity (10/08/2023)  Housing: Low Risk  (10/08/2023)  Transportation Needs: No Transportation Needs (10/08/2023)  Utilities: Not At Risk (10/08/2023)  Alcohol Screen: Low Risk  (04/08/2023)  Depression (PHQ2-9): Low Risk  (06/01/2023)  Financial Resource Strain: Low Risk  (04/08/2023)  Physical Activity: Insufficiently Active (04/08/2023)  Social Connections: Socially Isolated (10/08/2023)  Tobacco Use: Medium Risk (06/01/2023)     Readmission Risk Interventions    02/22/2023   11:41 AM 01/11/2023   12:44 PM  Readmission Risk Prevention Plan  Transportation Screening Complete Complete  PCP or Specialist Appt within 5-7 Days  Not Complete  Home Care Screening   Complete  Medication Review (RN CM)  Complete  Social Work Consult for Recovery Care Planning/Counseling Complete   Palliative Care Screening Not Applicable   Medication Review Oceanographer) Complete

## 2023-10-15 NOTE — Plan of Care (Signed)
  Problem: Education: Goal: Knowledge of General Education information will improve Description: Including pain rating scale, medication(s)/side effects and non-pharmacologic comfort measures Outcome: Progressing   Problem: Health Behavior/Discharge Planning: Goal: Ability to manage health-related needs will improve Outcome: Progressing   Problem: Clinical Measurements: Goal: Ability to maintain clinical measurements within normal limits will improve Outcome: Progressing Goal: Will remain free from infection Outcome: Progressing Goal: Diagnostic test results will improve Outcome: Progressing Goal: Respiratory complications will improve Outcome: Progressing Goal: Cardiovascular complication will be avoided Outcome: Progressing   Problem: Activity: Goal: Risk for activity intolerance will decrease Outcome: Progressing   Problem: Nutrition: Goal: Adequate nutrition will be maintained Outcome: Progressing   Problem: Coping: Goal: Level of anxiety will decrease Outcome: Progressing   Problem: Elimination: Goal: Will not experience complications related to bowel motility Outcome: Progressing Goal: Will not experience complications related to urinary retention Outcome: Progressing   Problem: Pain Managment: Goal: General experience of comfort will improve and/or be controlled Outcome: Progressing   Problem: Safety: Goal: Ability to remain free from injury will improve Outcome: Progressing   Problem: Skin Integrity: Goal: Risk for impaired skin integrity will decrease Outcome: Progressing   Problem: Education: Goal: Ability to demonstrate management of disease process will improve Outcome: Progressing Goal: Ability to verbalize understanding of medication therapies will improve Outcome: Progressing Goal: Individualized Educational Video(s) Outcome: Progressing   Problem: Activity: Goal: Capacity to carry out activities will improve Outcome: Progressing    Problem: Cardiac: Goal: Ability to achieve and maintain adequate cardiopulmonary perfusion will improve Outcome: Progressing   Problem: Education: Goal: Knowledge of disease or condition will improve Outcome: Progressing Goal: Understanding of medication regimen will improve Outcome: Progressing Goal: Individualized Educational Video(s) Outcome: Progressing   Problem: Activity: Goal: Ability to tolerate increased activity will improve Outcome: Progressing   Problem: Cardiac: Goal: Ability to achieve and maintain adequate cardiopulmonary perfusion will improve Outcome: Progressing   Problem: Health Behavior/Discharge Planning: Goal: Ability to safely manage health-related needs after discharge will improve Outcome: Progressing

## 2023-10-15 NOTE — Progress Notes (Signed)
 Mobility Specialist Progress Note:    10/15/23 0933  Mobility  Activity Ambulated with assistance  Level of Assistance Independent after set-up  Assistive Device Other (Comment) (IV Pole)  Distance Ambulated (ft) 500 ft  Activity Response Tolerated well  Mobility Referral Yes  Mobility visit 1 Mobility  Mobility Specialist Start Time (ACUTE ONLY) 0933  Mobility Specialist Stop Time (ACUTE ONLY) 0944  Mobility Specialist Time Calculation (min) (ACUTE ONLY) 11 min   Received pt laying in bed pleasant and agreeable to session. No c/o any symptoms. Pt moving and ambulating well. Returned pt to EOB w/ all needs met.   Venetia Keel Mobility Specialist Please Neurosurgeon or Rehab Office at [phone]

## 2023-10-15 NOTE — Progress Notes (Signed)
 OT Cancellation Note  Patient Details Name: Justin Fleming MRN: [id number] DOB: [birth year]   Cancelled Treatment:    Reason Eval/Treat Not Completed: OT screened, no needs identified, will sign off. Per evaluating PT , Pt ind with ADLs, no OT related concerns. OT is signing off on this pt. Please re-consult as necessary.  Giulio Bertino C, OT  Acute Rehabilitation Services Office [phone] Secure chat preferred   Justin Fleming Savers 10/15/2023, 6:56 AM

## 2023-10-15 NOTE — Progress Notes (Addendum)
 Discharge Nurse Summary: DC order noted per MD. DC RN at bedside with patient. Patient agreeable with discharge plan, family present at bedside. AVS printed/reviewed. Encouraged close monitoring of BP, weight, compliance with med treatment, and HF s/s to report to cardiologist for outpatient management. Discussed HF action plan. MD notified of SBP<90 without concern, patient cleared for discharge.  PICC line removed by IV team, skin intact. No DME needs. No home meds. TOC meds pending pickup. CP/Edu resolved. Telemonitor returned to charging station. All belongings accounted for. Patient wheeled downstairs by volunteer transport for discharge by private auto. Instructed to stop by [facility] during transport out.  Rosario EMERSON Lund, RN

## 2023-10-18 ENCOUNTER — Telehealth: Payer: Self-pay | Admitting: *Deleted

## 2023-10-18 NOTE — Transitions of Care (Post Inpatient/ED Visit) (Signed)
   10/18/2023  Name: Justin Fleming MRN: [id number] DOB: 26-Sep-1960  Today's TOC FU Call Status: Today's TOC FU Call Status:: Unsuccessful Call (1st Attempt) Unsuccessful Call (1st Attempt) Date: 10/18/23  Attempted to reach the patient regarding the most recent Inpatient/ED visit.  Follow Up Plan: Additional outreach attempts will be made to reach the patient to complete the Transitions of Care (Post Inpatient/ED visit) call.   Mliss Creed [facility], BSN RN Care Manager/ Transition of Care [place]/ [facility] [phone]

## 2023-10-19 ENCOUNTER — Telehealth: Payer: Self-pay | Admitting: *Deleted

## 2023-10-19 ENCOUNTER — Encounter: Payer: Self-pay | Admitting: Family Medicine

## 2023-10-19 ENCOUNTER — Ambulatory Visit (INDEPENDENT_AMBULATORY_CARE_PROVIDER_SITE_OTHER): Admitting: Family Medicine

## 2023-10-19 VITALS — BP 104/70 | HR 90 | Temp 98.4°F | Ht 71.0 in | Wt 180.0 lb

## 2023-10-19 DIAGNOSIS — I5020 Unspecified systolic (congestive) heart failure: Secondary | ICD-10-CM | POA: Diagnosis not present

## 2023-10-19 DIAGNOSIS — N1831 Chronic kidney disease, stage 3a: Secondary | ICD-10-CM | POA: Diagnosis not present

## 2023-10-19 DIAGNOSIS — I10 Essential (primary) hypertension: Secondary | ICD-10-CM

## 2023-10-19 DIAGNOSIS — D649 Anemia, unspecified: Secondary | ICD-10-CM

## 2023-10-19 DIAGNOSIS — M1A9XX0 Chronic gout, unspecified, without tophus (tophi): Secondary | ICD-10-CM

## 2023-10-19 DIAGNOSIS — I4819 Other persistent atrial fibrillation: Secondary | ICD-10-CM | POA: Diagnosis not present

## 2023-10-19 DIAGNOSIS — F1020 Alcohol dependence, uncomplicated: Secondary | ICD-10-CM

## 2023-10-19 MED ORDER — ALLOPURINOL 100 MG PO TABS: 100.0000 mg | ORAL_TABLET | Freq: Every day | ORAL | 1 refills | Status: AC

## 2023-10-19 NOTE — Transitions of Care (Post Inpatient/ED Visit) (Signed)
   10/19/2023  Name: Justin Fleming MRN: [id number] DOB: 1960/08/05  Today's TOC FU Call Status: Today's TOC FU Call Status:: Unsuccessful Call (2nd Attempt) Unsuccessful Call (2nd Attempt) Date: 10/19/23  Attempted to reach the patient regarding the most recent Inpatient/ED visit.  Follow Up Plan: Additional outreach attempts will be made to reach the patient to complete the Transitions of Care (Post Inpatient/ED visit) call.   Mliss Creed [facility], BSN RN Care Manager/ Transition of Care [place]/ [facility] [phone]

## 2023-10-19 NOTE — Assessment & Plan Note (Signed)
 Labs today
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
## 2023-10-19 NOTE — Assessment & Plan Note (Addendum)
 Followed by Cardiology. Well controlled on current regimen. Continue Amiodarone  200mg  daily, Digoxin  0.125mg  daily, Torsemide 40mg  daily. Not taking Spironolactone , will clarify with Cardiology.  Monitor weights, low sodium diet, encouraged alcohol cessation and medication compliance.  Keep appt with cardiology Friday, follow up with me in 3 months or sooner if needed. Labs today.

## 2023-10-19 NOTE — Assessment & Plan Note (Signed)
 Followed by Cardiology. Well controlled on current regimen. Continue Amiodarone  200mg  daily, Digoxin  0.125mg  daily, Torsemide 40mg  daily. Not taking Spironolactone , will clarify with Cardiology.  Recommend heart healthy diet such as Mediterranean diet with whole grains, fruits, vegetable, fish, lean meats, nuts, and olive oil. Limit salt. Encouraged moderate walking, 3-5 times/week for 30-50 minutes each session. Aim for at least 150 minutes.week. Goal should be pace of 3 miles/hours, or walking 1.5 miles in 30 minutes. Avoid tobacco products. Avoid excess alcohol. Take medications as prescribed and bring medications and blood pressure log with cuff to each office visit. Seek medical care for chest pain, palpitations, shortness of breath with exertion, dizziness/lightheadedness, vision changes, recurrent headaches, or swelling of extremities. Keep appt with cardiology Friday, follow up with me in 3 months or sooner if needed. Labs today.

## 2023-10-19 NOTE — Assessment & Plan Note (Signed)
 Continue allopurinol. Avoid ETOH.

## 2023-10-19 NOTE — Assessment & Plan Note (Signed)
 CBC today. Needs to reschedule EGD with GI, phone number provided.

## 2023-10-19 NOTE — Assessment & Plan Note (Signed)
 Recent TEE/DCCV 10/7. On Eliquis , Amio, Digoxin . Followed by cardiology.

## 2023-10-19 NOTE — Assessment & Plan Note (Signed)
 Encouraged to remain alcohol free and discussed risks. Encouraged AA for continued support

## 2023-10-20 ENCOUNTER — Telehealth: Payer: Self-pay

## 2023-10-20 LAB — CBC WITH DIFFERENTIAL/PLATELET
Absolute Lymphocytes: 1397 {cells}/uL (ref 850–3900)
Absolute Monocytes: 1022 {cells}/uL — ABNORMAL HIGH (ref 200–950)
Basophils Absolute: 50 {cells}/uL (ref 0–200)
Basophils Relative: 0.7 %
Eosinophils Absolute: 202 {cells}/uL (ref 15–500)
Eosinophils Relative: 2.8 %
HCT: 39.9 % (ref 38.5–50.0)
Hemoglobin: 12.6 g/dL — ABNORMAL LOW (ref 13.2–17.1)
MCH: 32.2 pg (ref 27.0–33.0)
MCHC: 31.6 g/dL — ABNORMAL LOW (ref 32.0–36.0)
MCV: 102 fL — ABNORMAL HIGH (ref 80.0–100.0)
MPV: 9.1 fL (ref 7.5–12.5)
Monocytes Relative: 14.2 %
Neutro Abs: 4529 {cells}/uL (ref 1500–7800)
Neutrophils Relative %: 62.9 %
Platelets: 398 Thousand/uL (ref 140–400)
RBC: 3.91 Million/uL — ABNORMAL LOW (ref 4.20–5.80)
RDW: 13.7 % (ref 11.0–15.0)
Total Lymphocyte: 19.4 %
WBC: 7.2 Thousand/uL (ref 3.8–10.8)

## 2023-10-20 LAB — COMPREHENSIVE METABOLIC PANEL WITH GFR
AG Ratio: 1.1 (calc) (ref 1.0–2.5)
ALT: 13 U/L (ref 9–46)
AST: 19 U/L (ref 10–35)
Albumin: 4.1 g/dL (ref 3.6–5.1)
Alkaline phosphatase (APISO): 70 U/L (ref 35–144)
BUN/Creatinine Ratio: 18 (calc) (ref 6–22)
BUN: 32 mg/dL — ABNORMAL HIGH (ref 7–25)
CO2: 35 mmol/L — ABNORMAL HIGH (ref 20–32)
Calcium: 9.2 mg/dL (ref 8.6–10.3)
Chloride: 94 mmol/L — ABNORMAL LOW (ref 98–110)
Creat: 1.74 mg/dL — ABNORMAL HIGH (ref 0.70–1.35)
Globulin: 3.7 g/dL (ref 1.9–3.7)
Glucose, Bld: 86 mg/dL (ref 65–99)
Potassium: 4.2 mmol/L (ref 3.5–5.3)
Sodium: 143 mmol/L (ref 135–146)
Total Bilirubin: 1.5 mg/dL — ABNORMAL HIGH (ref 0.2–1.2)
Total Protein: 7.8 g/dL (ref 6.1–8.1)
eGFR: 43 mL/min/1.73m2 — ABNORMAL LOW (ref >=60)

## 2023-10-20 LAB — IRON,TIBC AND FERRITIN PANEL
%SAT: 81 % — ABNORMAL HIGH (ref 20–48)
Ferritin: 595 ng/mL — ABNORMAL HIGH (ref 24–380)
Iron: 595 ug/dL — ABNORMAL HIGH (ref 50–380)
TIBC: 277 ug/dL (ref 250–425)

## 2023-10-20 LAB — B12 AND FOLATE PANEL
Folate: 14.9 ng/mL
Vitamin B-12: 379 pg/mL (ref 200–1100)

## 2023-10-20 LAB — LIPID PANEL
Cholesterol: 148 mg/dL (ref <200)
HDL: 60 mg/dL (ref >=40)
LDL Cholesterol (Calc): 73 mg/dL
Non-HDL Cholesterol (Calc): 88 mg/dL (ref <130)
Total CHOL/HDL Ratio: 2.5 (calc) (ref <5.0)
Triglycerides: 70 mg/dL (ref <150)

## 2023-10-20 LAB — SPECIMEN COMPROMISED

## 2023-10-20 LAB — SEDIMENTATION RATE: Sed Rate: 9 mm/h (ref 0–20)

## 2023-10-20 NOTE — Transitions of Care (Post Inpatient/ED Visit) (Signed)
   10/20/2023  Name: Justin Fleming MRN: [id number] DOB: July 30, 1960  Today's TOC FU Call Status: Today's TOC FU Call Status:: Unsuccessful Call (3rd Attempt) Unsuccessful Call (3rd Attempt) Date: 10/20/23  Attempted to reach the patient regarding the most recent Inpatient/ED visit.  Follow Up Plan: No further outreach attempts will be made at this time. We have been unable to contact the patient.  Alan Ee, RN, BSN, CEN [employer]- Transition of Care Team.  [facility] [phone]

## 2023-10-21 ENCOUNTER — Ambulatory Visit: Payer: Self-pay | Admitting: Family Medicine

## 2023-10-21 ENCOUNTER — Ambulatory Visit (HOSPITAL_COMMUNITY)
Admission: RE | Admit: 2023-10-21 | Discharge: 2023-10-21 | Disposition: A | Discharge status: Home / Self Care | Source: Ambulatory Visit | Attending: Physician Assistant | Admitting: Physician Assistant

## 2023-10-21 VITALS — BP 102/74 | HR 86 | Wt 179.2 lb

## 2023-10-21 DIAGNOSIS — N1831 Chronic kidney disease, stage 3a: Secondary | ICD-10-CM

## 2023-10-21 DIAGNOSIS — I130 Hypertensive heart and chronic kidney disease with heart failure and stage 1 through stage 4 chronic kidney disease, or unspecified chronic kidney disease: Secondary | ICD-10-CM | POA: Insufficient documentation

## 2023-10-21 DIAGNOSIS — Z7901 Long term (current) use of anticoagulants: Secondary | ICD-10-CM | POA: Insufficient documentation

## 2023-10-21 DIAGNOSIS — D509 Iron deficiency anemia, unspecified: Secondary | ICD-10-CM | POA: Insufficient documentation

## 2023-10-21 DIAGNOSIS — F1010 Alcohol abuse, uncomplicated: Secondary | ICD-10-CM | POA: Diagnosis not present

## 2023-10-21 DIAGNOSIS — Z79899 Other long term (current) drug therapy: Secondary | ICD-10-CM | POA: Insufficient documentation

## 2023-10-21 DIAGNOSIS — I5020 Unspecified systolic (congestive) heart failure: Secondary | ICD-10-CM

## 2023-10-21 DIAGNOSIS — I10 Essential (primary) hypertension: Secondary | ICD-10-CM | POA: Diagnosis not present

## 2023-10-21 DIAGNOSIS — I480 Paroxysmal atrial fibrillation: Secondary | ICD-10-CM | POA: Diagnosis not present

## 2023-10-21 DIAGNOSIS — R0683 Snoring: Secondary | ICD-10-CM

## 2023-10-21 DIAGNOSIS — I444 Left anterior fascicular block: Secondary | ICD-10-CM | POA: Diagnosis not present

## 2023-10-21 DIAGNOSIS — F1090 Alcohol use, unspecified, uncomplicated: Secondary | ICD-10-CM | POA: Insufficient documentation

## 2023-10-21 DIAGNOSIS — Z87891 Personal history of nicotine dependence: Secondary | ICD-10-CM | POA: Diagnosis not present

## 2023-10-21 MED ORDER — SPIRONOLACTONE 25 MG PO TABS: 25.0000 mg | ORAL_TABLET | Freq: Every day | ORAL | 3 refills | Status: AC

## 2023-10-21 MED ORDER — AMIODARONE HCL 200 MG PO TABS: 200.0000 mg | ORAL_TABLET | Freq: Every day | ORAL | Status: AC

## 2023-10-21 NOTE — Progress Notes (Addendum)
 ADVANCED HF CLINIC CONSULT NOTE  Primary Care: Kayla Jeoffrey RAMAN, FNP Primary Cardiologist: Jayson Sierras, MD HF Cardiologist: Dr. Zenaida  HPI: 63 y.o. male with history of alcohol abuse, tobacco use, atrial fibrillation, anxiety/depression, HTN, GERD.    Admitted in 12/24 with alcohol intoxication and Afib with RVR. Echo at that time with EF 55-60%. He was started on amiodarone  and digoxin . Underwent workup for anemia, and found to have gastritis on EGD. Refused colonoscopy.   He was readmitted 2/25 with Afib with RVR, a/c HF and fever. Had not been compliant with home medications. He was positive for coronaviurs OC43 and also treated for CAP. Echo with EF 30-35%, moderately reduced RV, moderate BAE. Loaded with IV amiodarone  and underwent TEE/DCCV to SR. Concern for low-output HF, PICC line placed, started on milrinone  with initial CO-OX of 54%. Midodrine  added to support blood pressure. Once volume optimized milrinone  weaned off. RHC showed normal to mildly elevated filling pressures and preserved Fick and TD CI. Ltd echo with maintenance of SR showed EF improved to 60-65%. GDMT remained limited by BP.   Admitted to [facility] 10/08/23 with acute decompensated heart failure and atrial fibrillation with RVR due to medication noncompliance and alcohol abuse. Initially given diltiazem , but unable to tolerate due to BP, he was switched to IV amio and started on diuresis with IV Lasix . Echo w/ EF <20% with severely reduced RV function. However, diuresis complicated by hypotension, he was briefly transferred to the ICU for low dose levophed. Advanced Heart Failure was consulted and he was started on low dose milrinone  to assist diuresis. Milrinone  wean tolerated. He underwent successful TEE/DCCV on 10/7, however post cardioversion, cardiac output fell, but recovered without inotrope support. GDMT limited by hypotension.   He is here today for post hospital CHF follow-up. Has been doing well since  discharge. Currently staying with his sister. She has been trying to motivate him to start a walking routine with her. Shortness has significantly improved since discharge. Clinic weight is down 7 lb from hospital discharge weight. Has not been weighing at home, has a scale to use. No orthopnea or PND. Lower extremity edema almost completely resolved. Taking all medications except he has been out of potassium supplement.  Consuming 1-2 ETOH drinks a day.   Cardiac Studies - Ltd Echo 2/25: EF 60-65%, RV mildly reduced, severe LVH   - RHC 2/25: RA 6, PA 45/18 (29), PCWP 17, CO/CI (Fick) 6/3, PVR 2-2.1 WU. PAPi 4.5 WU   - TEE (02/11/23): EF 30-35%, mild MR, mild to moderate TR, moderately sized patent foramen ovale with R-->L shunting No thrombus   - Echo 02/09/23: 30-35%, RV moderately down, mild to moderate MR   - Echo 12/24: EF 55-60%, mild LVH  Past Medical History:  Diagnosis Date   Alcohol abuse    Rehab 2016   Anxiety    Atrial fibrillation (HCC)    Depression    GERD (gastroesophageal reflux disease)    Gout    Headache    Hypertension    Current Outpatient Medications  Medication Sig Dispense Refill   acetaminophen  (TYLENOL ) 500 MG tablet Take 500 mg by mouth every 6 (six) hours as needed for moderate pain (pain score 4-6).     allopurinol (ZYLOPRIM) 100 MG tablet Take 1 tablet (100 mg total) by mouth daily. 90 tablet 1   amiodarone  (PACERONE ) 200 MG tablet Take 1 tablet (200 mg total) by mouth 2 (two) times daily. Take 200 mg twice daily for  10 days (until 10/20), then take 200 mg daily 40 tablet 6   apixaban  (ELIQUIS ) 5 MG TABS tablet Take 1 tablet (5 mg total) by mouth 2 (two) times daily. 60 tablet 11   dapagliflozin  propanediol (FARXIGA ) 10 MG TABS tablet Take 1 tablet (10 mg total) by mouth daily before breakfast. 30 tablet 11   digoxin  (LANOXIN ) 0.125 MG tablet Take 1 tablet (0.125 mg total) by mouth daily. 30 tablet 12   ferrous sulfate  325 (65 FE) MG tablet Take 1 tablet  (325 mg total) by mouth 2 (two) times daily with a meal. 60 tablet 0   folic acid  (FOLVITE ) 1 MG tablet Take 1 tablet (1 mg total) by mouth daily.     pantoprazole  (PROTONIX ) 40 MG tablet Take 1 tablet (40 mg total) by mouth 2 (two) times daily.     potassium chloride  (KLOR-CON ) 10 MEQ tablet Take 20 mEq by mouth daily.     torsemide (DEMADEX) 20 MG tablet Take 2 tablets (40 mg total) by mouth daily. 60 tablet 6   cyanocobalamin  (VITAMIN B12) 500 MCG tablet Take 1 tablet (500 mcg total) by mouth daily. (Patient not taking: Reported on 10/21/2023)     No current facility-administered medications for this encounter.   Allergies  Allergen Reactions   Chlorhexidine  Gluconate Rash   Social History   Socioeconomic History   Marital status: Single    Spouse name: Not on file   Number of children: 0   Years of education: Not on file   Highest education level: High school graduate  Occupational History   Occupation: retired  Tobacco Use   Smoking status: Former    Types: Software engineer, Cigarettes   Smokeless tobacco: Never   Tobacco comments:    quit x 5 months  Vaping Use   Vaping status: Never Used  Substance and Sexual Activity   Alcohol use: Yes    Comment: 2-3 shots liquor each evening   Drug use: No   Sexual activity: Not Currently  Other Topics Concern   Not on file  Social History Narrative   Not on file   Social Drivers of Health   Financial Resource Strain: Low Risk  (04/08/2023)   Overall Financial Resource Strain (CARDIA)    Difficulty of Paying Living Expenses: Not hard at all  Food Insecurity: No Food Insecurity (10/08/2023)   Hunger Vital Sign    Worried About Running Out of Food in the Last Year: Never true    Ran Out of Food in the Last Year: Never true  Transportation Needs: No Transportation Needs (10/08/2023)   PRAPARE - Administrator, Civil Service (Medical): No    Lack of Transportation (Non-Medical): No  Physical Activity: Insufficiently Active  (04/08/2023)   Exercise Vital Sign    Days of Exercise per Week: 1 day    Minutes of Exercise per Session: 10 min  Stress: Not on file  Social Connections: Socially Isolated (10/08/2023)   Social Connection and Isolation Panel    Frequency of Communication with Friends and Family: More than three times a week    Frequency of Social Gatherings with Friends and Family: More than three times a week    Attends Religious Services: Never    Database administrator or Organizations: No    Attends Banker Meetings: Never    Marital Status: Never married  Intimate Partner Violence: Not At Risk (10/08/2023)   Humiliation, Afraid, Rape, and Kick questionnaire    Fear  of Current or Ex-Partner: No    Emotionally Abused: No    Physically Abused: No    Sexually Abused: No   Family History  Problem Relation Age of Onset   Colon cancer Maternal Uncle    Wt Readings from Last 3 Encounters:  10/21/23 81.3 kg (179 lb 3.2 oz)  10/19/23 81.7 kg (180 lb 0.6 oz)  10/15/23 84.4 kg (186 lb 1.1 oz)   BP 102/74   Pulse 86   Wt 81.3 kg (179 lb 3.2 oz)   SpO2 96%   BMI 24.99 kg/m   PHYSICAL EXAM: General:  Appears older than stated age Neck: JVP 6-7 Cor: Regular rate & rhythm. No murmurs. Lungs: clear Extremities: no edema Neuro: alert & oriented x 3. 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 EF had recovered to 60-65% on echo 2/25. - Readmit 10/25 with acute on chronic BiV failure in setting of recurrent AF with RVR, ETOH abuse and noncompliance - Echo 10/25 LVEF < 20% severe RV dysfunction.  - NYHA II/early III currently. Volume looks good on exam. Continue  Torsemide 40 mg daily. Keep off K supp. Recent K on 10/14 was 4.2.  - Continue farxiga  10 mg daily - Continue digoxin  0.125 mg daily - Start spiro 25 mg daily - BMET/BNP in 1 week - May benefit from paramedicine in the future if he has recurrent issues with compliance   2. Paroxysmal atrial fibrillation - Diagnosed 12/24 - S/p DCCV to SR 2/25 - Recurrence 10/25 in setting of noncompliance with meds and ETOH. Underwent TEE/DCCV to SR on 10/7. - ECG today with SR - Continue amiodarone  200 BID until 10/20, then reduce to 200 mg daily. TSH and LFTs okay in 10/25 - Continue Eliquis  5 mg BID   3. ETOH abuse - States he has cut back but still drinking 1-2/day - Discussed cessation   4. Snoring - Refer for in-lab sleep study  5. CKD IIIa - Creatinine baseline ~ 1.5, recently slightly higher 1.7-1.8 in setting of low-output HF - Scr 1.74 on 10/14. Labs in 1 week  6. IDA - Given IV iron during recent admit  Follow up 3 weeks with APP to titrate GDMT and assess compliance  Manuelita Dutch, PA-C 10/21/23

## 2023-10-21 NOTE — Progress Notes (Signed)
 Patient Name: Justin Fleming        DOB: [birth year]      Height: 5'10    Weight:179.2lbs  Office Name:[facility]         Referring Provider: Hughes Brownie  Today's Date: 10/21/23  Date:  10/21/23 STOP BANG RISK ASSESSMENT S (snore) Have you been told that you snore?     YES   T (tired) Are you often tired, fatigued, or sleepy during the day?   NO  O (obstruction) Do you stop breathing, choke, or gasp during sleep? YES   P (pressure) Do you have or are you being treated for high blood pressure? NO   B (BMI) Is your body index greater than 35 kg/m? NO   A (age) Are you 55 years old or older? YES   N (neck) Do you have a neck circumference greater than 16 inches?   YES/NO   G (gender) Are you a male? YES   TOTAL STOP/BANG "YES" ANSWERS 4                                                                       For Office Use Only              Procedure Order Form    YES to 3+ Stop Bang questions OR two clinical symptoms - patient qualifies for WatchPAT (CPT 95800)             Clinical Notes: Will consult Sleep Specialist and refer for management of therapy due to patient increased risk of Sleep Apnea. Ordering a sleep study due to the following two clinical symptoms: Loud snoring R06.83/ History of high blood pressure R03.0   I understand that I am proceeding with a home sleep apnea test as ordered by my treating physician. I understand that untreated sleep apnea is a serious cardiovascular risk factor and it is my responsibility to perform the test and seek management for sleep apnea. I will be contacted with the results and be managed for sleep apnea by a local sleep physician. I will be receiving equipment and further instructions from [facility]. I shall promptly ship back the equipment via the included mailing label. I understand my insurance will be billed for the test and as the patient I am responsible for any insurance related out-of-pocket costs  incurred. I have been provided with written instructions and can call for additional video or telephonic instruction, with 24-hour availability of qualified personnel to answer any questions: Patient Help Desk [phone].  Patient Signature ______________________________________________________   Date______________________ Patient Telemedicine Verbal Consent

## 2023-10-21 NOTE — Patient Instructions (Addendum)
 EKG done today.  No Labs done today.   STOP taking Potassium  ON MONDAY 10/25/23 DECREASE Amiodarone  to 200mg  (1 tablet) by mouth daily.  START Spironolactone  25mg  (1 tablet) by mouth daily.   No other medication changes were made. Please continue all current medications as prescribed.  Your physician has recommended that you have a sleep study. This test records several body functions during sleep, including: brain activity, eye movement, oxygen and carbon dioxide blood levels, heart rate and rhythm, breathing rate and rhythm, the flow of air through your mouth and nose, snoring, body muscle movements, and chest and belly movement. Someone from the sleep center will contact you to schedule an appointment.   Your physician recommends that you schedule a follow-up appointment in: 1 week for a lab only appointment and in 3 weeks for an appointment with our NP/PA Clinic here in our office.  If you have any questions or concerns before your next appointment please send us  a message through [place] or call our office at [phone].    TO LEAVE A MESSAGE FOR THE NURSE SELECT OPTION 2, PLEASE LEAVE A MESSAGE INCLUDING: YOUR NAME DATE OF BIRTH CALL BACK NUMBER REASON FOR CALL**this is important as we prioritize the call backs  YOU WILL RECEIVE A CALL BACK THE SAME DAY AS LONG AS YOU CALL BEFORE 4:00 PM   Do the following things EVERYDAY: Weigh yourself in the morning before breakfast. Write it down and keep it in a log. Take your medicines as prescribed Eat low salt foods--Limit salt (sodium) to 2000 mg per day.  Stay as active as you can everyday Limit all fluids for the day to less than 2 liters   At the [facility], you and your health needs are our priority. As part of our continuing mission to provide you with exceptional heart care, we have created designated Provider Care Teams. These Care Teams include your primary Cardiologist (physician) and Advanced Practice  Providers (APPs- Physician Assistants and Nurse Practitioners) who all work together to provide you with the care you need, when you need it.   You may see any of the following providers on your designated Care Team at your next follow up: Dr Toribio Fuel Dr Ezra Shuck Dr. Ria Gardenia Greig Lenetta, NP Caffie Shed, [place] [facility] [place], [place] Beckey Coe, NP Tinnie Redman, PharmD   Please be sure to bring in all your medications bottles to every appointment.    Thank you for choosing [facility]

## 2023-10-21 NOTE — Addendum Note (Signed)
 Encounter addended by: Alvan Fausto SAUNDERS, CMA on: 10/21/2023 2:06 PM  Actions taken: Clinical Note Signed, Visit diagnoses modified, Order list changed, Diagnosis association updated

## 2023-10-28 ENCOUNTER — Ambulatory Visit (HOSPITAL_COMMUNITY)

## 2023-11-02 ENCOUNTER — Ambulatory Visit (HOSPITAL_COMMUNITY)
Admission: RE | Admit: 2023-11-02 | Discharge: 2023-11-02 | Disposition: A | Discharge status: Home / Self Care | Source: Ambulatory Visit | Attending: Internal Medicine | Admitting: Internal Medicine

## 2023-11-02 DIAGNOSIS — I5020 Unspecified systolic (congestive) heart failure: Secondary | ICD-10-CM | POA: Insufficient documentation

## 2023-11-02 LAB — BASIC METABOLIC PANEL WITH GFR
Anion gap: 11 (ref 5–15)
BUN: 29 mg/dL — ABNORMAL HIGH (ref 8–23)
CO2: 32 mmol/L (ref 22–32)
Calcium: 9 mg/dL (ref 8.9–10.3)
Chloride: 96 mmol/L — ABNORMAL LOW (ref 98–111)
Creatinine, Ser: 1.93 mg/dL — ABNORMAL HIGH (ref 0.61–1.24)
GFR, Estimated: 38 mL/min — ABNORMAL LOW (ref >60)
Glucose, Bld: 100 mg/dL — ABNORMAL HIGH (ref 70–99)
Potassium: 4.1 mmol/L (ref 3.5–5.1)
Sodium: 139 mmol/L (ref 135–145)

## 2023-11-02 LAB — ECHO TEE

## 2023-11-03 ENCOUNTER — Ambulatory Visit (HOSPITAL_COMMUNITY): Payer: Self-pay | Admitting: Physician Assistant

## 2023-11-10 ENCOUNTER — Telehealth (HOSPITAL_COMMUNITY): Payer: Self-pay

## 2023-11-10 NOTE — Telephone Encounter (Signed)
 Called to confirm/remind patient of their appointment at the [facility] on 11/11/23.   Appointment:   [] Confirmed  [x] Left mess   [] No answer/No voice mail  [] VM Full/unable to leave message  [] Phone not in service  And to bring in all medications and/or complete list.

## 2023-11-11 ENCOUNTER — Ambulatory Visit (HOSPITAL_COMMUNITY)
Admission: RE | Admit: 2023-11-11 | Discharge: 2023-11-11 | Disposition: A | Discharge status: Home / Self Care | Source: Ambulatory Visit | Attending: Cardiology | Admitting: Cardiology

## 2023-11-11 ENCOUNTER — Ambulatory Visit (HOSPITAL_COMMUNITY): Payer: Self-pay | Admitting: Cardiology

## 2023-11-11 ENCOUNTER — Encounter (HOSPITAL_COMMUNITY): Payer: Self-pay

## 2023-11-11 VITALS — BP 94/66 | HR 94 | Wt 186.4 lb

## 2023-11-11 DIAGNOSIS — Z7901 Long term (current) use of anticoagulants: Secondary | ICD-10-CM | POA: Insufficient documentation

## 2023-11-11 DIAGNOSIS — I5022 Chronic systolic (congestive) heart failure: Secondary | ICD-10-CM | POA: Insufficient documentation

## 2023-11-11 DIAGNOSIS — Z87891 Personal history of nicotine dependence: Secondary | ICD-10-CM | POA: Insufficient documentation

## 2023-11-11 DIAGNOSIS — Z7984 Long term (current) use of oral hypoglycemic drugs: Secondary | ICD-10-CM | POA: Insufficient documentation

## 2023-11-11 DIAGNOSIS — I480 Paroxysmal atrial fibrillation: Secondary | ICD-10-CM | POA: Insufficient documentation

## 2023-11-11 DIAGNOSIS — D631 Anemia in chronic kidney disease: Secondary | ICD-10-CM | POA: Insufficient documentation

## 2023-11-11 DIAGNOSIS — Z79899 Other long term (current) drug therapy: Secondary | ICD-10-CM | POA: Diagnosis not present

## 2023-11-11 DIAGNOSIS — D509 Iron deficiency anemia, unspecified: Secondary | ICD-10-CM | POA: Diagnosis not present

## 2023-11-11 DIAGNOSIS — I081 Rheumatic disorders of both mitral and tricuspid valves: Secondary | ICD-10-CM | POA: Diagnosis not present

## 2023-11-11 DIAGNOSIS — I130 Hypertensive heart and chronic kidney disease with heart failure and stage 1 through stage 4 chronic kidney disease, or unspecified chronic kidney disease: Secondary | ICD-10-CM | POA: Insufficient documentation

## 2023-11-11 DIAGNOSIS — N1831 Chronic kidney disease, stage 3a: Secondary | ICD-10-CM | POA: Insufficient documentation

## 2023-11-11 LAB — BASIC METABOLIC PANEL WITH GFR
Anion gap: 10 (ref 5–15)
BUN: 18 mg/dL (ref 8–23)
CO2: 32 mmol/L (ref 22–32)
Calcium: 9 mg/dL (ref 8.9–10.3)
Chloride: 99 mmol/L (ref 98–111)
Creatinine, Ser: 1.72 mg/dL — ABNORMAL HIGH (ref 0.61–1.24)
GFR, Estimated: 44 mL/min — ABNORMAL LOW (ref >60)
Glucose, Bld: 110 mg/dL — ABNORMAL HIGH (ref 70–99)
Potassium: 3.9 mmol/L (ref 3.5–5.1)
Sodium: 141 mmol/L (ref 135–145)

## 2023-11-11 LAB — BRAIN NATRIURETIC PEPTIDE: B Natriuretic Peptide: 382 pg/mL — ABNORMAL HIGH (ref 0.0–100.0)

## 2023-11-11 LAB — DIGOXIN LEVEL: Digoxin Level: 1.1 ng/mL (ref 0.8–2.0)

## 2023-11-11 MED ORDER — TORSEMIDE 20 MG PO TABS: ORAL_TABLET | ORAL | 3 refills | Status: DC

## 2023-11-11 MED ORDER — FOLIC ACID 1 MG PO TABS: 1.0000 mg | ORAL_TABLET | Freq: Every day | ORAL | 2 refills | Status: AC

## 2023-11-11 MED ORDER — PANTOPRAZOLE SODIUM 40 MG PO TBEC: 40.0000 mg | DELAYED_RELEASE_TABLET | Freq: Two times a day (BID) | ORAL | 2 refills | Status: AC

## 2023-11-11 NOTE — Patient Instructions (Addendum)
 Good to see you today!    INCREASE torsemide to 40 mg ( 2 tablets in the morning)and 20 mg (1 tablet ) in the evening  Labs done today, your results will be available in MyChart, we will contact you for abnormal readings.  Your physician recommends that you schedule a follow-up appointment   If you have any questions or concerns before your next appointment please send us  a message through [place] or call our office at [phone].    TO LEAVE A MESSAGE FOR THE NURSE SELECT OPTION 2, PLEASE LEAVE A MESSAGE INCLUDING: YOUR NAME DATE OF BIRTH CALL BACK NUMBER REASON FOR CALL**this is important as we prioritize the call backs  YOU WILL RECEIVE A CALL BACK THE SAME DAY AS LONG AS YOU CALL BEFORE 4:00 PM At the [facility], you and your health needs are our priority. As part of our continuing mission to provide you with exceptional heart care, we have created designated Provider Care Teams. These Care Teams include your primary Cardiologist (physician) and Advanced Practice Providers (APPs- Physician Assistants and Nurse Practitioners) who all work together to provide you with the care you need, when you need it.   You may see any of the following providers on your designated Care Team at your next follow up: Dr Toribio Fuel Dr Ezra Shuck Dr. Morene Brownie Greig Mosses, NP Caffie Shed, [place] [facility] [place], [place] Beckey Coe, NP Jordan Lee, NP Ellouise Class, NP Tinnie Redman, PharmD Jaun Bash, PharmD   Please be sure to bring in all your medications bottles to every appointment.    Thank you for choosing [facility]

## 2023-11-11 NOTE — Progress Notes (Signed)
 ReDS Vest / Clip - 11/11/23 1125       ReDS Vest / Clip   Station Marker C    Ruler Value 30    ReDS Value Range Moderate volume overload    ReDS Actual Value 38

## 2023-11-11 NOTE — Progress Notes (Signed)
 ADVANCED HF CLINIC PROGRESS NOTE  Primary Care: Kayla Jeoffrey RAMAN, FNP Primary Cardiologist: Jayson Sierras, MD HF Cardiologist: Dr. Zenaida  HPI: 63 y.o. male with history of alcohol abuse, tobacco use, atrial fibrillation, anxiety/depression, HTN, GERD.    Admitted in 12/24 with alcohol intoxication and Afib with RVR. Echo at that time with EF 55-60%. He was started on amiodarone  and digoxin . Underwent workup for anemia, and found to have gastritis on EGD. Refused colonoscopy.   He was readmitted 2/25 with Afib with RVR, a/c HF and fever. Had not been compliant with home medications. He was positive for coronaviurs OC43 and also treated for CAP. Echo with EF 30-35%, moderately reduced RV, moderate BAE. Loaded with IV amiodarone  and underwent TEE/DCCV to SR. Concern for low-output HF, PICC line placed, started on milrinone  with initial CO-OX of 54%. Midodrine  added to support blood pressure. Once volume optimized milrinone  weaned off. RHC showed normal to mildly elevated filling pressures and preserved Fick and TD CI. Ltd echo with maintenance of SR showed EF improved to 60-65%. GDMT remained limited by BP.   Admitted to [facility] 10/08/23 with acute decompensated heart failure and atrial fibrillation with RVR due to medication noncompliance and alcohol abuse. Initially given diltiazem , but unable to tolerate due to BP, he was switched to IV amio and started on diuresis with IV Lasix . Echo w/ EF <20% with severely reduced RV function. However, diuresis complicated by hypotension, he was briefly transferred to the ICU for low dose levophed. Advanced Heart Failure was consulted and he was started on low dose milrinone  to assist diuresis. Milrinone  wean tolerated. He underwent successful TEE/DCCV on 10/7, however post cardioversion, cardiac output fell, but recovered without inotrope support. GDMT limited by hypotension.   Seen for post hospital on 10/16 and was stable. Wt was 179 lb. Soft BP limited  GDMT titration. Instructed to continue daily torsemide and GDMT.   He presents to clinic today for f/u and further med titration. Wt up 7 lb from 179>>186. He reports compliance w/ torsemide. BP soft 94/66, no orthostatic symptoms. Reports improved and stable NYHA Class II symtoms.   ReDs 38%.   EKG shows NSR 91 bpm.   Says he quit drinking   Wt Readings from Last 3 Encounters:  11/11/23 84.6 kg (186 lb 6.4 oz)  10/21/23 81.3 kg (179 lb 3.2 oz)  10/19/23 81.7 kg (180 lb 0.6 oz)     Cardiac Studies - Ltd Echo 2/25: EF 60-65%, RV mildly reduced, severe LVH   - RHC 2/25: RA 6, PA 45/18 (29), PCWP 17, CO/CI (Fick) 6/3, PVR 2-2.1 WU. PAPi 4.5 WU   - TEE (02/11/23): EF 30-35%, mild MR, mild to moderate TR, moderately sized patent foramen ovale with R-->L shunting No thrombus   - Echo 02/09/23: 30-35%, RV moderately down, mild to moderate MR   - Echo 12/24: EF 55-60%, mild LVH  Past Medical History:  Diagnosis Date   Alcohol abuse    Rehab 2016   Anxiety    Atrial fibrillation (HCC)    Depression    GERD (gastroesophageal reflux disease)    Gout    Headache    Hypertension    Current Outpatient Medications  Medication Sig Dispense Refill   acetaminophen  (TYLENOL ) 500 MG tablet Take 500 mg by mouth every 6 (six) hours as needed for moderate pain (pain score 4-6).     allopurinol (ZYLOPRIM) 100 MG tablet Take 1 tablet (100 mg total) by mouth daily. 90 tablet 1  amiodarone  (PACERONE ) 200 MG tablet Take 1 tablet (200 mg total) by mouth daily. Take 200 mg twice daily for 10 days (until 10/20), then take 200 mg daily     apixaban  (ELIQUIS ) 5 MG TABS tablet Take 1 tablet (5 mg total) by mouth 2 (two) times daily. 60 tablet 11   dapagliflozin  propanediol (FARXIGA ) 10 MG TABS tablet Take 1 tablet (10 mg total) by mouth daily before breakfast. 30 tablet 11   digoxin  (LANOXIN ) 0.125 MG tablet Take 1 tablet (0.125 mg total) by mouth daily. 30 tablet 12   ferrous sulfate  325 (65 FE) MG  tablet Take 1 tablet (325 mg total) by mouth 2 (two) times daily with a meal. 60 tablet 0   spironolactone  (ALDACTONE ) 25 MG tablet Take 1 tablet (25 mg total) by mouth daily. 90 tablet 3   cyanocobalamin  (VITAMIN B12) 500 MCG tablet Take 1 tablet (500 mcg total) by mouth daily. (Patient not taking: Reported on 11/11/2023)     folic acid  (FOLVITE ) 1 MG tablet Take 1 tablet (1 mg total) by mouth daily. 30 tablet 2   pantoprazole  (PROTONIX ) 40 MG tablet Take 1 tablet (40 mg total) by mouth 2 (two) times daily. 60 tablet 2   torsemide (DEMADEX) 20 MG tablet Take 2 tablets (40 mg total) by mouth in the morning AND 1 tablet (20 mg total) every evening. 90 tablet 3   No current facility-administered medications for this encounter.   Allergies  Allergen Reactions   Chlorhexidine  Gluconate Rash   Social History   Socioeconomic History   Marital status: Single    Spouse name: Not on file   Number of children: 0   Years of education: Not on file   Highest education level: High school graduate  Occupational History   Occupation: retired  Tobacco Use   Smoking status: Former    Types: Software Engineer, Cigarettes   Smokeless tobacco: Never   Tobacco comments:    quit x 5 months  Vaping Use   Vaping status: Never Used  Substance and Sexual Activity   Alcohol use: Yes    Comment: 2-3 shots liquor each evening   Drug use: No   Sexual activity: Not Currently  Other Topics Concern   Not on file  Social History Narrative   Not on file   Social Drivers of Health   Financial Resource Strain: Low Risk  (04/08/2023)   Overall Financial Resource Strain (CARDIA)    Difficulty of Paying Living Expenses: Not hard at all  Food Insecurity: No Food Insecurity (10/08/2023)   Hunger Vital Sign    Worried About Running Out of Food in the Last Year: Never true    Ran Out of Food in the Last Year: Never true  Transportation Needs: No Transportation Needs (10/08/2023)   PRAPARE - Scientist, Research (physical Sciences) (Medical): No    Lack of Transportation (Non-Medical): No  Physical Activity: Insufficiently Active (04/08/2023)   Exercise Vital Sign    Days of Exercise per Week: 1 day    Minutes of Exercise per Session: 10 min  Stress: Not on file  Social Connections: Socially Isolated (10/08/2023)   Social Connection and Isolation Panel    Frequency of Communication with Friends and Family: More than three times a week    Frequency of Social Gatherings with Friends and Family: More than three times a week    Attends Religious Services: Never    Database Administrator or Organizations: No  Attends Banker Meetings: Never    Marital Status: Never married  Intimate Partner Violence: Not At Risk (10/08/2023)   Humiliation, Afraid, Rape, and Kick questionnaire    Fear of Current or Ex-Partner: No    Emotionally Abused: No    Physically Abused: No    Sexually Abused: No   Family History  Problem Relation Age of Onset   Colon cancer Maternal Uncle    Wt Readings from Last 3 Encounters:  11/11/23 84.6 kg (186 lb 6.4 oz)  10/21/23 81.3 kg (179 lb 3.2 oz)  10/19/23 81.7 kg (180 lb 0.6 oz)   BP 94/66   Pulse 94   Wt 84.6 kg (186 lb 6.4 oz)   SpO2 95%   BMI 26.00 kg/m   PHYSICAL EXAM: ReDs 38%, abnormal  GENERAL: NAD Lungs- clear  CARDIAC:  JVP not elevated         Normal rate with regular rhythm. No  MRG. No LEE  ABDOMEN: Soft, non-tender, non-distended.  EXTREMITIES: Warm and well perfused.  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 EF had recovered to 60-65% on echo 2/25. - Readmit 10/25 with acute on chronic BiV  failure in setting of recurrent AF with RVR, ETOH abuse and noncompliance - Echo 10/25 LVEF < 20% severe RV dysfunction.  - NYHA Class II. Volume appears ok on exam but wt is up 7 lb and ReDs elevated at 38% - increase torsemide to 40 qam/ 20 qpm. Check BMP and BNP today    - Continue farxiga  10 mg daily - Continue digoxin  0.125 mg daily. Check dig level  - Continue spiro 25 mg daily - BP too soft for ARB/ARNi  - dicussed low sodium diet - May benefit from paramedicine in the future if he has recurrent issues with compliance   2. Paroxysmal atrial fibrillation - Diagnosed 12/24 - S/p DCCV to SR 2/25 - Recurrence 10/25 in setting of noncompliance with meds and ETOH. Underwent TEE/DCCV to SR on 10/7. - ECG today shows NSR  - Continue amiodarone  200 mg daily. TSH and LFTs okay in 10/25 - Continue Eliquis  5 mg BID   3. ETOH abuse - says he quit smoking, congratulated on efforts    4. Snoring - Referred for in-lab sleep study. Test not completed yet   5. CKD IIIa - Creatinine baseline ~ 1.5, recently slightly higher 1.7-1.8 in setting of low-output HF - Check BMP today  - continue Farxiga    6. IDA - Given IV iron during recent admit  F/u in 4 wks w/ pharmD, 8 wks w/ APP and 12 wks w/ Dr. Zenaida and repeat Echo    Caffie Shed, PA-C  11/11/23

## 2023-11-11 NOTE — Addendum Note (Signed)
 Encounter addended by: Kelvis Berger B, RN on: 11/11/2023 4:04 PM  Actions taken: Flowsheet accepted, Clinical Note Signed, Charge Capture section accepted

## 2023-11-16 ENCOUNTER — Encounter (HOSPITAL_COMMUNITY): Payer: Self-pay

## 2023-11-19 ENCOUNTER — Other Ambulatory Visit (HOSPITAL_COMMUNITY): Payer: Self-pay

## 2023-12-08 ENCOUNTER — Ambulatory Visit (HOSPITAL_BASED_OUTPATIENT_CLINIC_OR_DEPARTMENT_OTHER): Attending: Cardiology | Admitting: Cardiology

## 2023-12-08 ENCOUNTER — Telehealth (HOSPITAL_COMMUNITY): Payer: Self-pay

## 2023-12-08 VITALS — Ht 68.0 in | Wt 180.0 lb

## 2023-12-08 DIAGNOSIS — R0902 Hypoxemia: Secondary | ICD-10-CM | POA: Diagnosis present

## 2023-12-08 DIAGNOSIS — G4733 Obstructive sleep apnea (adult) (pediatric): Secondary | ICD-10-CM | POA: Diagnosis not present

## 2023-12-08 DIAGNOSIS — I10 Essential (primary) hypertension: Secondary | ICD-10-CM | POA: Diagnosis not present

## 2023-12-08 DIAGNOSIS — R0683 Snoring: Secondary | ICD-10-CM | POA: Diagnosis not present

## 2023-12-08 NOTE — Telephone Encounter (Signed)
 Called to confirm/remind patient of their appointment at the [facility] on 12/09/23.   Appointment:   [x] Confirmed  [] Left mess   [] No answer/No voice mail  [] VM Full/unable to leave message  [] Phone not in service  Patient reminded to bring all medications and/or complete list.  Confirmed patient has transportation. Gave directions, instructed to utilize valet parking.

## 2023-12-09 ENCOUNTER — Ambulatory Visit (HOSPITAL_COMMUNITY): Admission: RE | Admit: 2023-12-09 | Discharge: 2023-12-09 | Attending: Physician Assistant

## 2023-12-09 VITALS — BP 114/84 | HR 86 | Wt 185.4 lb

## 2023-12-09 DIAGNOSIS — F1010 Alcohol abuse, uncomplicated: Secondary | ICD-10-CM

## 2023-12-09 DIAGNOSIS — R0683 Snoring: Secondary | ICD-10-CM

## 2023-12-09 DIAGNOSIS — I5020 Unspecified systolic (congestive) heart failure: Secondary | ICD-10-CM | POA: Diagnosis not present

## 2023-12-09 DIAGNOSIS — I480 Paroxysmal atrial fibrillation: Secondary | ICD-10-CM | POA: Diagnosis not present

## 2023-12-09 DIAGNOSIS — N1831 Chronic kidney disease, stage 3a: Secondary | ICD-10-CM

## 2023-12-09 MED ORDER — TORSEMIDE 20 MG PO TABS: ORAL_TABLET | ORAL | 1 refills | Status: AC

## 2023-12-09 NOTE — Progress Notes (Addendum)
 ADVANCED HF CLINIC PROGRESS NOTE  Primary Care: Kayla Jeoffrey RAMAN, FNP Primary Cardiologist: Jayson Sierras, MD HF Cardiologist: Dr. Zenaida  HPI: 63 y.o. male with history of alcohol abuse, tobacco use, atrial fibrillation, anxiety/depression, HTN, GERD.    Admitted in 12/24 with alcohol intoxication and Afib with RVR. Echo at that time with EF 55-60%. He was started on amiodarone  and digoxin . Underwent workup for anemia, and found to have gastritis on EGD. Refused colonoscopy.   He was readmitted 2/25 with Afib with RVR, a/c HF and fever. Had not been compliant with home medications. He was positive for coronaviurs OC43 and also treated for CAP. Echo with EF 30-35%, moderately reduced RV, moderate BAE. Loaded with IV amiodarone  and underwent TEE/DCCV to SR. Concern for low-output HF, PICC line placed, started on milrinone  with initial CO-OX of 54%. Midodrine  added to support blood pressure. Once volume optimized milrinone  weaned off. RHC showed normal to mildly elevated filling pressures and preserved Fick and TD CI. Ltd echo with maintenance of SR showed EF improved to 60-65%. GDMT remained limited by BP.   Admitted to [facility] 10/08/23 with acute decompensated heart failure and atrial fibrillation with RVR due to medication noncompliance and alcohol abuse. Initially given diltiazem , but unable to tolerate due to BP, he was switched to IV amio and started on diuresis with IV Lasix . Echo w/ EF <20% with severely reduced RV function. However, diuresis complicated by hypotension, he was briefly transferred to the ICU for low dose levophed . Advanced Heart Failure was consulted and he was started on low dose milrinone  to assist diuresis. Milrinone  wean tolerated. He underwent successful TEE/DCCV on 10/7, however post cardioversion, cardiac output fell, but recovered without inotrope support. GDMT limited by hypotension.   Seen for follow-up last month and was mildly volume overloaded with 7 lb  weight gain. Torsemide  was increased.   He is here today for CHF follow-up. His weight is down 1 lb from last visit.  Denies shortness of breath, orthopnea, PND and lower extremity edema. He tried taking higher dose of Torsemide  but it made him urinate more frequently. He is back to taking 20 mg BID. Reports adherence with other medications. Struggles with limiting fluid and sodium intake. Has cut back ETOH significantly, had just 1 drink on Thanksgiving.   Past Medical History:  Diagnosis Date   Alcohol abuse    Rehab 2016   Anxiety    Atrial fibrillation (HCC)    Depression    GERD (gastroesophageal reflux disease)    Gout    Headache    Hypertension    Current Outpatient Medications  Medication Sig Dispense Refill   acetaminophen  (TYLENOL ) 500 MG tablet Take 500 mg by mouth every 6 (six) hours as needed for moderate pain (pain score 4-6).     allopurinol  (ZYLOPRIM ) 100 MG tablet Take 1 tablet (100 mg total) by mouth daily. 90 tablet 1   amiodarone  (PACERONE ) 200 MG tablet Take 1 tablet (200 mg total) by mouth daily. Take 200 mg twice daily for 10 days (until 10/20), then take 200 mg daily     apixaban  (ELIQUIS ) 5 MG TABS tablet Take 1 tablet (5 mg total) by mouth 2 (two) times daily. 60 tablet 11   cyanocobalamin  (VITAMIN B12) 500 MCG tablet Take 1 tablet (500 mcg total) by mouth daily.     dapagliflozin  propanediol (FARXIGA ) 10 MG TABS tablet Take 1 tablet (10 mg total) by mouth daily before breakfast. 30 tablet 11   digoxin  (LANOXIN )  0.125 MG tablet Take 1 tablet (0.125 mg total) by mouth daily. 30 tablet 12   ferrous sulfate  325 (65 FE) MG tablet Take 1 tablet (325 mg total) by mouth 2 (two) times daily with a meal. 60 tablet 0   folic acid  (FOLVITE ) 1 MG tablet Take 1 tablet (1 mg total) by mouth daily. 30 tablet 2   pantoprazole  (PROTONIX ) 40 MG tablet Take 1 tablet (40 mg total) by mouth 2 (two) times daily. 60 tablet 2   spironolactone  (ALDACTONE ) 25 MG tablet Take 1 tablet (25  mg total) by mouth daily. 90 tablet 3   torsemide  (DEMADEX ) 20 MG tablet Take 2 tablets (40 mg total) by mouth in the morning AND 1 tablet (20 mg total) every evening. 90 tablet 3   No current facility-administered medications for this encounter.   Allergies  Allergen Reactions   Chlorhexidine  Gluconate Rash   Social History   Socioeconomic History   Marital status: Single    Spouse name: Not on file   Number of children: 0   Years of education: Not on file   Highest education level: High school graduate  Occupational History   Occupation: retired  Tobacco Use   Smoking status: Former    Types: Software Engineer, Cigarettes   Smokeless tobacco: Never   Tobacco comments:    quit x 5 months  Vaping Use   Vaping status: Never Used  Substance and Sexual Activity   Alcohol use: Yes    Comment: 2-3 shots liquor each evening   Drug use: No   Sexual activity: Not Currently  Other Topics Concern   Not on file  Social History Narrative   Not on file   Social Drivers of Health   Financial Resource Strain: Low Risk  (04/08/2023)   Overall Financial Resource Strain (CARDIA)    Difficulty of Paying Living Expenses: Not hard at all  Food Insecurity: No Food Insecurity (10/08/2023)   Hunger Vital Sign    Worried About Running Out of Food in the Last Year: Never true    Ran Out of Food in the Last Year: Never true  Transportation Needs: No Transportation Needs (10/08/2023)   PRAPARE - Administrator, Civil Service (Medical): No    Lack of Transportation (Non-Medical): No  Physical Activity: Insufficiently Active (04/08/2023)   Exercise Vital Sign    Days of Exercise per Week: 1 day    Minutes of Exercise per Session: 10 min  Stress: Not on file  Social Connections: Socially Isolated (10/08/2023)   Social Connection and Isolation Panel    Frequency of Communication with Friends and Family: More than three times a week    Frequency of Social Gatherings with Friends and Family: More  than three times a week    Attends Religious Services: Never    Database Administrator or Organizations: No    Attends Banker Meetings: Never    Marital Status: Never married  Intimate Partner Violence: Not At Risk (10/08/2023)   Humiliation, Afraid, Rape, and Kick questionnaire    Fear of Current or Ex-Partner: No    Emotionally Abused: No    Physically Abused: No    Sexually Abused: No   Family History  Problem Relation Age of Onset   Colon cancer Maternal Uncle    Wt Readings from Last 3 Encounters:  12/09/23 84.1 kg (185 lb 6.4 oz)  12/08/23 81.6 kg (180 lb)  11/11/23 84.6 kg (186 lb 6.4 oz)  BP 114/84   Pulse 86   Wt 84.1 kg (185 lb 6.4 oz)   SpO2 97%   BMI 28.19 kg/m   PHYSICAL EXAM: General:  No distress. Ambulated into clinic. Cor: Jvp 7-8. Regular rate & rhythm. No murmurs. Lungs: clear Abdomen: soft, nontender, nondistended. Extremities: 1+ edema Neuro: alert & orientedx3. 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 EF had recovered to 60-65% on echo 2/25. - Readmit 10/25 with acute on chronic BiV failure in setting of recurrent AF with RVR, ETOH abuse and noncompliance - Echo 10/25 LVEF < 20% severe RV dysfunction.  - NYHA Class II. Volume mildly up on exam. ReDS elevated at 41%. Increase Torsemide  to 40 mg q am, 20 mg q pm (he stopped this dose in the past as it made him urinate too much).  - Continue farxiga  10 mg daily - Continue digoxin  0.125 mg daily. Check dig level with labs next week. - Continue spiro 25 mg daily - BP has been too soft for ARB/ARNi in the past, better today. If Scr  stable on labs next week will attempt to add low dose losartan  - reinforced low sodium diet and fluid restriction - May benefit from paramedicine in the future if he has recurrent issues with compliance - Repeat echo at f/u in 2 months   2. Paroxysmal atrial fibrillation - Diagnosed 12/24 - S/p DCCV to SR 2/25 - Recurrence 10/25 in setting of noncompliance with meds and ETOH. Underwent TEE/DCCV to SR on 10/7. - SR on ECG at last f/u 11/06. Rhythm regular today.  - Continue amiodarone  200 mg daily. TSH and LFTs okay in 10/25 - Continue Eliquis  5 mg BID   3. ETOH abuse - has cut back ETOH use significantly. Congratulated on this.    4. Snoring - Sleep study completed yesterday, result pending  5. CKD IIIa - Creatinine baseline ~ 1.5, recently slightly higher 1.7-1.8 in setting of low-output HF - continue Farxiga   - Repeat labs next week  6. IDA - Given IV iron  during recent admit  F/u 2 months with APP with echo   [facility], Maeghan Canny N, PA-C  12/09/23

## 2023-12-09 NOTE — Patient Instructions (Addendum)
 Good to see you today!  INCREASE torsemide  to 40 mg ( 2 tablets) in the am and 20 mg ( 1 tablet) in the evening  Lab work in a week as scheduled.on that day HOLD digoxin  dose  Your physician recommends that you schedule a follow-up appointment   If you have any questions or concerns before your next appointment please send us  a message through [facility] or call our office at [phone].    TO LEAVE A MESSAGE FOR THE NURSE SELECT OPTION 2, PLEASE LEAVE A MESSAGE INCLUDING: YOUR NAME DATE OF BIRTH CALL BACK NUMBER REASON FOR CALL**this is important as we prioritize the call backs  YOU WILL RECEIVE A CALL BACK THE SAME DAY AS LONG AS YOU CALL BEFORE 4:00 PM At the [facility], you and your health needs are our priority. As part of our continuing mission to provide you with exceptional heart care, we have created designated Provider Care Teams. These Care Teams include your primary Cardiologist (physician) and Advanced Practice Providers (APPs- Physician Assistants and Nurse Practitioners) who all work together to provide you with the care you need, when you need it.   You may see any of the following providers on your designated Care Team at your next follow up: Dr Toribio Fuel Dr Ezra Shuck Dr. Morene Brownie Greig Mosses, NP Caffie Shed, [place] [facility] [place], [place] Beckey Coe, NP Jordan Lee, NP Ellouise Class, NP Tinnie Redman, PharmD Jaun Bash, PharmD   Please be sure to bring in all your medications bottles to every appointment.    Thank you for choosing [facility]

## 2023-12-09 NOTE — Progress Notes (Signed)
 ReDS Vest / Clip - 12/09/23 1200       ReDS Vest / Clip   Station Marker C    Ruler Value 32    ReDS Value Range High volume overload    ReDS Actual Value 41

## 2023-12-16 ENCOUNTER — Ambulatory Visit (HOSPITAL_COMMUNITY)

## 2024-01-03 NOTE — Procedures (Signed)
 " Indications for Polysomnography The patient is a 63 year old Male who is 5' 8 and weighs 180.0 lbs.  His BMI equals 27.6.  A diagnostic polysomnogram was performed to evaluate for Obstructive Sleep Apnea.  After 142.0 minutes of sleep time the patient exhibited sufficient respiratory  events qualifying him for a CPAP trial which was then initiated.  No Medication taken.No Data. Polysomnogram Data A full night polysomnogram was performed recording the standard physiologic parameters including EEG, EOG, EMG, EKG, nasal and oral airflow.  Respiratory parameters of chest and abdominal movements are recorded with [place]-Crystal motion transducers.   Oxygen saturation was recorded by pulse oximetry.  Sleep Architecture The total recording time of the diagnostic portion of the study was 183.8 minutes.  The total sleep time was 142.0 minutes.  During the diagnostic portion of the study, the patient spent 5.3% of total sleep time in Stage N1, 56.7% in Stage N2, 0.0% in  Stages N3, and 38.0% in REM.   Sleep latency was 22.3 minutes.  REM latency was 36.5 minutes.  Sleep Efficiency was 77.3%.  Wake after Sleep Onset time was 19.5 minutes.  At 01:27:19 AM the patient was placed on PAP treatment and was titrated at pressures ranging from 5 cm/H20 up to 7 cm/H20.  The total recording time of the treatment portion of the study was 208.1 minutes.  The total sleep time was 76.0 minutes.  During  the treatment portion of the study, the patient spent 11.8% of total sleep time in Stage N1, 29.6% in Stage N2, 0.0% in Stages N3, and 58.6% in REM.   Sleep latency was 73.0 minutes.  REM latency was 33.5 minutes.  Sleep Efficiency was 36.5%.  Wake after  Sleep Onset time was 59.0 minutes.  Respiratory Events During the diagnostic portion of the study, the polysomnogram revealed a presence of 1 obstructive, 3 central, and 0 mixed apneas resulting in an Apnea index of 1.7 events per hour.  There were 68 hypopneas  (GreaterEqual to3% desaturation and/or arousal)  resulting in an Apnea\Hypopnea Index (AHI GreaterEqual to3% desaturation and/or arousal) of 30.4 events per hour.  There were 39 hypopneas (GreaterEqual to4% desaturation) resulting in an Apnea\Hypopnea Index (AHI GreaterEqual to4% desaturation) of 18.2  events per hour.  There were 8 Respiratory Effort Related Arousals resulting in a RERA index of 3.4 events per hour. The Respiratory Disturbance Index is 33.8 events per hour.  The snore index was 0 events per hour.  Mean oxygen saturation was 91.4%.   The lowest oxygen saturation during sleep was 78.0%.  Time spent LessEqual to88% oxygen saturation was  minutes ().  During the treatment portion of the study, the polysomnogram revealed a presence of 0 obstructive, 0 central, and 0 mixed apneas resulting in an Apnea index of 0 events per hour.  There were 16 hypopneas (GreaterEqual to3% desaturation and/or arousal)  resulting in an Apnea\Hypopnea Index (AHI GreaterEqual to3% desaturation and/or arousal) of 12.6 events per hour.  There were 6 hypopneas (GreaterEqual to4% desaturation) resulting in an Apnea\Hypopnea Index (AHI GreaterEqual to4% desaturation) of 4.7  events per hour.  There were 2 Respiratory Effort Related Arousals resulting in a RERA index of 1.6 events per hour. The Respiratory Disturbance Index is 14.2 events per hour.  The snore index was 0 events per hour.  Mean oxygen saturation was 93.4%.   The lowest oxygen saturation during sleep was 87.0%.  Time spent LessEqual to88% oxygen saturation was  minutes ().  Limb Activity During the diagnostic portion  of the study, there were 0 limb movements recorded.  During the treatment portion of the study, there were  limb movements recorded.   Cardiac Summary During the diagnostic portion of the study, the average pulse rate was 74.0 bpm.  The minimum pulse rate was 61.0 bpm while the maximum pulse rate was 95.0 bpm.  During the treatment  portion of the study, the average pulse rate was 70.7 bpm.  The minimum pulse rate was 57.0 bpm while the maximum pulse rate was 89.0 bpm.   Diagnosis: Moderate Obstructive Sleep Apnea Nocturnal Hypoxemia  Recommendations: 1. Recommend a trial of ResMed CPAP at 7cm H2O with heated humidity and large [employer] Simplus FFM. 2. Close follow-up is necessary to ensure success with CPAP or oral appliance therapy for maximum benefit 3. A follow-up oximetry study on CPAP is recommended to assess the adequacy of therapy and determine the need for supplemental oxygen or the potential need for Bi-level therapy.  An arterial blood gas to determine the adequacy of baseline ventilation and  oxygenation should also be considered. 4. Healthy sleep recommendations include:  adequate nightly sleep (normal 7-9 hrs/night), avoidance of caffeine after noon and alcohol near bedtime, and maintaining a sleep environment that is cool, dark and quiet. Weight loss for overweight patients is recommended.  Even modest amounts of weight loss can significantly improve the severity of sleep apnea. 5. Snoring recommendations include:  weight loss where appropriate, side sleeping, and avoidance of alcohol before bed. 6. Operation of motor vehicle should be avoided when sleepy.    This study was personally reviewed and electronically signed by: Dr. Wilbert Bihari Accredited Board Certified in Sleep Medicine Date/Time: 01/03/2024 11:53AM "

## 2024-01-12 ENCOUNTER — Other Ambulatory Visit

## 2024-01-12 DIAGNOSIS — E538 Deficiency of other specified B group vitamins: Secondary | ICD-10-CM

## 2024-01-12 DIAGNOSIS — D62 Acute posthemorrhagic anemia: Secondary | ICD-10-CM

## 2024-01-12 DIAGNOSIS — I10 Essential (primary) hypertension: Secondary | ICD-10-CM

## 2024-01-13 LAB — CBC WITH DIFFERENTIAL/PLATELET
Absolute Lymphocytes: 1973 {cells}/uL (ref 850–3900)
Absolute Monocytes: 931 {cells}/uL (ref 200–950)
Basophils Absolute: 40 {cells}/uL (ref 0–200)
Basophils Relative: 0.6 %
Eosinophils Absolute: 92 {cells}/uL (ref 15–500)
Eosinophils Relative: 1.4 %
HCT: 45.2 % (ref 39.4–51.1)
Hemoglobin: 14.5 g/dL (ref 13.2–17.1)
MCH: 32.3 pg (ref 27.0–33.0)
MCHC: 32.1 g/dL (ref 31.6–35.4)
MCV: 100.7 fL (ref 81.4–101.7)
MPV: 10.3 fL (ref 7.5–12.5)
Monocytes Relative: 14.1 %
Neutro Abs: 3564 {cells}/uL (ref 1500–7800)
Neutrophils Relative %: 54 %
Platelets: 302 Thousand/uL (ref 140–400)
RBC: 4.49 Million/uL (ref 4.20–5.80)
RDW: 12.5 % (ref 11.0–15.0)
Total Lymphocyte: 29.9 %
WBC: 6.6 Thousand/uL (ref 3.8–10.8)

## 2024-01-13 LAB — COMPREHENSIVE METABOLIC PANEL WITH GFR
AG Ratio: 1.4 (calc) (ref 1.0–2.5)
ALT: 8 U/L — ABNORMAL LOW (ref 9–46)
AST: 16 U/L (ref 10–35)
Albumin: 4.5 g/dL (ref 3.6–5.1)
Alkaline phosphatase (APISO): 85 U/L (ref 35–144)
BUN/Creatinine Ratio: 14 (calc) (ref 6–22)
BUN: 21 mg/dL (ref 7–25)
CO2: 39 mmol/L — ABNORMAL HIGH (ref 20–32)
Calcium: 9.9 mg/dL (ref 8.6–10.3)
Chloride: 93 mmol/L — ABNORMAL LOW (ref 98–110)
Creat: 1.46 mg/dL — ABNORMAL HIGH (ref 0.70–1.35)
Globulin: 3.3 g/dL (ref 1.9–3.7)
Glucose, Bld: 96 mg/dL (ref 65–99)
Potassium: 4.1 mmol/L (ref 3.5–5.3)
Sodium: 141 mmol/L (ref 135–146)
Total Bilirubin: 0.5 mg/dL (ref 0.2–1.2)
Total Protein: 7.8 g/dL (ref 6.1–8.1)
eGFR: 53 mL/min/1.73m2 — ABNORMAL LOW

## 2024-01-13 LAB — IRON,TIBC AND FERRITIN PANEL
%SAT: 22 % (ref 20–48)
Ferritin: 249 ng/mL (ref 24–380)
Iron: 66 ug/dL (ref 50–180)
TIBC: 300 ug/dL (ref 250–425)

## 2024-01-13 LAB — VITAMIN B12: Vitamin B-12: 265 pg/mL (ref 200–1100)

## 2024-01-16 ENCOUNTER — Ambulatory Visit: Payer: Self-pay | Admitting: Family Medicine

## 2024-01-19 ENCOUNTER — Encounter: Payer: Self-pay | Admitting: Family Medicine

## 2024-01-19 ENCOUNTER — Ambulatory Visit: Admitting: Family Medicine

## 2024-01-19 VITALS — BP 99/68 | HR 79 | Temp 97.7°F | Ht 68.0 in | Wt 180.0 lb

## 2024-01-19 DIAGNOSIS — I4819 Other persistent atrial fibrillation: Secondary | ICD-10-CM

## 2024-01-19 DIAGNOSIS — F1020 Alcohol dependence, uncomplicated: Secondary | ICD-10-CM | POA: Diagnosis not present

## 2024-01-19 DIAGNOSIS — I5020 Unspecified systolic (congestive) heart failure: Secondary | ICD-10-CM | POA: Diagnosis not present

## 2024-01-19 DIAGNOSIS — N1831 Chronic kidney disease, stage 3a: Secondary | ICD-10-CM

## 2024-01-19 DIAGNOSIS — Z23 Encounter for immunization: Secondary | ICD-10-CM

## 2024-01-19 DIAGNOSIS — G4733 Obstructive sleep apnea (adult) (pediatric): Secondary | ICD-10-CM

## 2024-01-19 NOTE — Addendum Note (Signed)
 Addended by: CORINNA BRISKER R on: 01/19/2024 11:09 AM   Modules accepted: Orders
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
## 2024-01-24 ENCOUNTER — Telehealth: Payer: Self-pay | Admitting: *Deleted

## 2024-01-24 NOTE — Telephone Encounter (Signed)
-----   Message from Wilbert Bihari, MD sent at 01/03/2024 11:54 AM EST ----- Please let patient know that they have sleep apnea with successful PAP titration.  PAP ordered placed in Epic.  Followup in 6 weeks after starting PAP therapy

## 2024-01-24 NOTE — Telephone Encounter (Signed)
 Called result no vm set up.

## 2024-02-01 ENCOUNTER — Encounter: Payer: Self-pay | Admitting: *Deleted

## 2024-02-01 NOTE — Telephone Encounter (Signed)
 Called result no vm set up.

## 2024-02-08 ENCOUNTER — Telehealth (HOSPITAL_COMMUNITY): Payer: Self-pay

## 2024-02-08 NOTE — Telephone Encounter (Signed)
 Called to confirm/remind patient of their appointment at the [facility] on 02/09/24.   Appointment:   [x] Confirmed  [] Left mess   [] No answer/No voice mail  [] VM Full/unable to leave message  [] Phone not in service  Patient reminded to bring all medications and/or complete list.  Confirmed patient has transportation. Gave directions, instructed to utilize valet parking.

## 2024-02-09 ENCOUNTER — Encounter (HOSPITAL_COMMUNITY): Payer: Self-pay

## 2024-02-09 ENCOUNTER — Ambulatory Visit (HOSPITAL_COMMUNITY)
Admission: RE | Admit: 2024-02-09 | Discharge: 2024-02-09 | Disposition: A | Source: Ambulatory Visit | Attending: *Deleted | Admitting: *Deleted

## 2024-02-09 ENCOUNTER — Ambulatory Visit (HOSPITAL_COMMUNITY): Payer: Self-pay | Admitting: Family Medicine

## 2024-02-09 VITALS — BP 122/76 | HR 76 | Wt 185.8 lb

## 2024-02-09 DIAGNOSIS — N1831 Chronic kidney disease, stage 3a: Secondary | ICD-10-CM

## 2024-02-09 DIAGNOSIS — I5020 Unspecified systolic (congestive) heart failure: Secondary | ICD-10-CM | POA: Insufficient documentation

## 2024-02-09 DIAGNOSIS — I480 Paroxysmal atrial fibrillation: Secondary | ICD-10-CM | POA: Diagnosis not present

## 2024-02-09 DIAGNOSIS — Z79899 Other long term (current) drug therapy: Secondary | ICD-10-CM | POA: Insufficient documentation

## 2024-02-09 DIAGNOSIS — I130 Hypertensive heart and chronic kidney disease with heart failure and stage 1 through stage 4 chronic kidney disease, or unspecified chronic kidney disease: Secondary | ICD-10-CM | POA: Insufficient documentation

## 2024-02-09 DIAGNOSIS — G4730 Sleep apnea, unspecified: Secondary | ICD-10-CM | POA: Insufficient documentation

## 2024-02-09 DIAGNOSIS — G4733 Obstructive sleep apnea (adult) (pediatric): Secondary | ICD-10-CM | POA: Diagnosis not present

## 2024-02-09 DIAGNOSIS — I5022 Chronic systolic (congestive) heart failure: Secondary | ICD-10-CM | POA: Diagnosis not present

## 2024-02-09 DIAGNOSIS — F1010 Alcohol abuse, uncomplicated: Secondary | ICD-10-CM

## 2024-02-09 DIAGNOSIS — Z87891 Personal history of nicotine dependence: Secondary | ICD-10-CM | POA: Insufficient documentation

## 2024-02-09 DIAGNOSIS — Z7901 Long term (current) use of anticoagulants: Secondary | ICD-10-CM | POA: Insufficient documentation

## 2024-02-09 LAB — ECHOCARDIOGRAM COMPLETE
Area-P 1/2: 3.91 cm2
S' Lateral: 2.5 cm

## 2024-02-09 LAB — BASIC METABOLIC PANEL WITH GFR
Anion gap: 9 (ref 5–15)
BUN: 15 mg/dL (ref 8–23)
CO2: 30 mmol/L (ref 22–32)
Calcium: 9.4 mg/dL (ref 8.9–10.3)
Chloride: 101 mmol/L (ref 98–111)
Creatinine, Ser: 1.2 mg/dL (ref 0.61–1.24)
GFR, Estimated: >60 mL/min
Glucose, Bld: 89 mg/dL (ref 70–99)
Potassium: 4.7 mmol/L (ref 3.5–5.1)
Sodium: 139 mmol/L (ref 135–145)

## 2024-02-09 LAB — PRO BRAIN NATRIURETIC PEPTIDE: Pro Brain Natriuretic Peptide: 710 pg/mL — ABNORMAL HIGH

## 2024-02-09 NOTE — Patient Instructions (Addendum)
 Good to see you today!  Congratulations you have graduated from the heart failure clinic!  STOP digoxin   Labs done today, your results will be available in MyChart, we will contact you for abnormal readings.  Follow up with Dr. Debera as scheduled

## 2024-04-18 ENCOUNTER — Encounter: Admitting: Family Medicine
